# Patient Record
Sex: Female | Born: 1960 | Race: Black or African American | Hispanic: No | Marital: Single | Smoking: Former smoker
Health system: Southern US, Community
[De-identification: ages and names within clinical notes are randomized; demographics above are authoritative.]

## PROBLEM LIST (undated history)

## (undated) DIAGNOSIS — C259 Malignant neoplasm of pancreas, unspecified: Secondary | ICD-10-CM

## (undated) DIAGNOSIS — Z8049 Family history of malignant neoplasm of other genital organs: Secondary | ICD-10-CM

## (undated) DIAGNOSIS — Z800 Family history of malignant neoplasm of digestive organs: Secondary | ICD-10-CM

## (undated) HISTORY — DX: Family history of malignant neoplasm of other genital organs: Z80.49

## (undated) HISTORY — DX: Family history of malignant neoplasm of digestive organs: Z80.0

---

## 1998-05-09 ENCOUNTER — Encounter: Payer: Self-pay | Admitting: *Deleted

## 1998-05-09 ENCOUNTER — Ambulatory Visit (HOSPITAL_COMMUNITY): Admission: RE | Admit: 1998-05-09 | Discharge: 1998-05-09 | Payer: Self-pay | Admitting: *Deleted

## 1998-05-27 ENCOUNTER — Encounter: Admission: RE | Admit: 1998-05-27 | Discharge: 1998-05-27 | Payer: Self-pay | Admitting: Internal Medicine

## 1998-08-13 ENCOUNTER — Ambulatory Visit (HOSPITAL_COMMUNITY): Admission: RE | Admit: 1998-08-13 | Discharge: 1998-08-13 | Payer: Self-pay | Admitting: *Deleted

## 1998-08-13 ENCOUNTER — Encounter: Payer: Self-pay | Admitting: *Deleted

## 2010-07-04 ENCOUNTER — Inpatient Hospital Stay (INDEPENDENT_AMBULATORY_CARE_PROVIDER_SITE_OTHER)
Admission: RE | Admit: 2010-07-04 | Discharge: 2010-07-04 | Disposition: A | Discharge status: Home / Self Care | Payer: 59 | Source: Ambulatory Visit | Attending: Emergency Medicine | Admitting: Emergency Medicine

## 2010-07-04 DIAGNOSIS — H6690 Otitis media, unspecified, unspecified ear: Secondary | ICD-10-CM

## 2020-06-17 ENCOUNTER — Emergency Department (HOSPITAL_COMMUNITY): Payer: 59

## 2020-06-17 ENCOUNTER — Other Ambulatory Visit: Payer: Self-pay

## 2020-06-17 ENCOUNTER — Inpatient Hospital Stay (HOSPITAL_COMMUNITY)
Admission: EM | Admit: 2020-06-17 | Discharge: 2020-06-22 | DRG: 424 | Disposition: A | Discharge status: Home Health | Payer: 59 | Attending: Internal Medicine | Admitting: Internal Medicine

## 2020-06-17 ENCOUNTER — Encounter (HOSPITAL_COMMUNITY): Payer: Self-pay

## 2020-06-17 DIAGNOSIS — C787 Secondary malignant neoplasm of liver and intrahepatic bile duct: Secondary | ICD-10-CM | POA: Diagnosis not present

## 2020-06-17 DIAGNOSIS — C7800 Secondary malignant neoplasm of unspecified lung: Secondary | ICD-10-CM | POA: Diagnosis present

## 2020-06-17 DIAGNOSIS — K8689 Other specified diseases of pancreas: Secondary | ICD-10-CM | POA: Diagnosis not present

## 2020-06-17 DIAGNOSIS — Z800 Family history of malignant neoplasm of digestive organs: Secondary | ICD-10-CM

## 2020-06-17 DIAGNOSIS — I10 Essential (primary) hypertension: Secondary | ICD-10-CM | POA: Diagnosis present

## 2020-06-17 DIAGNOSIS — K5900 Constipation, unspecified: Secondary | ICD-10-CM | POA: Diagnosis present

## 2020-06-17 DIAGNOSIS — Z8049 Family history of malignant neoplasm of other genital organs: Secondary | ICD-10-CM | POA: Diagnosis not present

## 2020-06-17 DIAGNOSIS — Z20822 Contact with and (suspected) exposure to covid-19: Secondary | ICD-10-CM | POA: Diagnosis present

## 2020-06-17 DIAGNOSIS — R1011 Right upper quadrant pain: Secondary | ICD-10-CM

## 2020-06-17 DIAGNOSIS — C762 Malignant neoplasm of abdomen: Secondary | ICD-10-CM | POA: Diagnosis not present

## 2020-06-17 DIAGNOSIS — R160 Hepatomegaly, not elsewhere classified: Secondary | ICD-10-CM

## 2020-06-17 DIAGNOSIS — F17210 Nicotine dependence, cigarettes, uncomplicated: Secondary | ICD-10-CM | POA: Diagnosis present

## 2020-06-17 DIAGNOSIS — C252 Malignant neoplasm of tail of pancreas: Secondary | ICD-10-CM | POA: Diagnosis present

## 2020-06-17 DIAGNOSIS — R935 Abnormal findings on diagnostic imaging of other abdominal regions, including retroperitoneum: Secondary | ICD-10-CM

## 2020-06-17 LAB — COMPREHENSIVE METABOLIC PANEL
ALT: 26 U/L (ref 0–44)
AST: 38 U/L (ref 15–41)
Albumin: 3.3 g/dL — ABNORMAL LOW (ref 3.5–5.0)
Alkaline Phosphatase: 412 U/L — ABNORMAL HIGH (ref 38–126)
Anion gap: 9 (ref 5–15)
BUN: 9 mg/dL (ref 8–23)
CO2: 26 mmol/L (ref 22–32)
Calcium: 9.6 mg/dL (ref 8.9–10.3)
Chloride: 102 mmol/L (ref 98–111)
Creatinine, Ser: 1 mg/dL (ref 0.44–1.00)
GFR, Estimated: >60 mL/min (ref >60)
Glucose, Bld: 104 mg/dL — ABNORMAL HIGH (ref 70–99)
Potassium: 4 mmol/L (ref 3.5–5.1)
Sodium: 137 mmol/L (ref 135–145)
Total Bilirubin: 0.7 mg/dL (ref 0.3–1.2)
Total Protein: 8.1 g/dL (ref 6.5–8.1)

## 2020-06-17 LAB — CBC WITH DIFFERENTIAL/PLATELET
Abs Immature Granulocytes: 0.02 10*3/uL (ref 0.00–0.07)
Basophils Absolute: 0.1 10*3/uL (ref 0.0–0.1)
Basophils Relative: 1 %
Eosinophils Absolute: 0.1 10*3/uL (ref 0.0–0.5)
Eosinophils Relative: 2 %
HCT: 43.3 % (ref 36.0–46.0)
Hemoglobin: 14 g/dL (ref 12.0–15.0)
Immature Granulocytes: 0 %
Lymphocytes Relative: 33 %
Lymphs Abs: 2.2 10*3/uL (ref 0.7–4.0)
MCH: 29 pg (ref 26.0–34.0)
MCHC: 32.3 g/dL (ref 30.0–36.0)
MCV: 89.8 fL (ref 80.0–100.0)
Monocytes Absolute: 0.7 10*3/uL (ref 0.1–1.0)
Monocytes Relative: 10 %
Neutro Abs: 3.7 10*3/uL (ref 1.7–7.7)
Neutrophils Relative %: 54 %
Platelets: 386 10*3/uL (ref 150–400)
RBC: 4.82 MIL/uL (ref 3.87–5.11)
RDW: 12.7 % (ref 11.5–15.5)
WBC: 6.9 10*3/uL (ref 4.0–10.5)
nRBC: 0 % (ref 0.0–0.2)

## 2020-06-17 LAB — RESP PANEL BY RT-PCR (FLU A&B, COVID) ARPGX2
Influenza A by PCR: NEGATIVE
Influenza B by PCR: NEGATIVE
SARS Coronavirus 2 by RT PCR: NEGATIVE

## 2020-06-17 LAB — LIPASE, BLOOD: Lipase: 43 U/L (ref 11–51)

## 2020-06-17 MED ORDER — SENNA 8.6 MG PO TABS
2.0000 | ORAL_TABLET | Freq: Every day | ORAL | Status: DC
  Administered 2020-06-17 – 2020-06-21 (×4): 17.2 mg via ORAL
  Filled 2020-06-17 (×5): qty 2

## 2020-06-17 MED ORDER — SODIUM CHLORIDE 0.9% FLUSH
3.0000 mL | Freq: Two times a day (BID) | INTRAVENOUS | Status: DC
  Administered 2020-06-17 – 2020-06-22 (×7): 3 mL via INTRAVENOUS

## 2020-06-17 MED ORDER — SODIUM CHLORIDE 0.9% FLUSH: 3.0000 mL | INTRAVENOUS | Status: DC | PRN

## 2020-06-17 MED ORDER — MORPHINE SULFATE (PF) 4 MG/ML IV SOLN
6.0000 mg | Freq: Once | INTRAVENOUS | Status: AC
  Administered 2020-06-17: 6 mg via INTRAVENOUS
  Filled 2020-06-17: qty 2

## 2020-06-17 MED ORDER — ONDANSETRON HCL 4 MG PO TABS: 4.0000 mg | ORAL_TABLET | Freq: Four times a day (QID) | ORAL | Status: DC | PRN

## 2020-06-17 MED ORDER — MORPHINE SULFATE (PF) 2 MG/ML IV SOLN
2.0000 mg | INTRAVENOUS | Status: DC | PRN
Start: 2020-06-17 — End: 2020-06-19
  Administered 2020-06-19: 2 mg via INTRAVENOUS
  Filled 2020-06-17: qty 1

## 2020-06-17 MED ORDER — SODIUM CHLORIDE 0.9 % IV SOLN
250.0000 mL | INTRAVENOUS | Status: DC | PRN
  Administered 2020-06-17: 250 mL via INTRAVENOUS

## 2020-06-17 MED ORDER — HYDROMORPHONE HCL 1 MG/ML IJ SOLN
0.5000 mg | Freq: Once | INTRAMUSCULAR | Status: AC
  Administered 2020-06-17: 0.5 mg via INTRAVENOUS
  Filled 2020-06-17: qty 1

## 2020-06-17 MED ORDER — ONDANSETRON HCL 4 MG/2ML IJ SOLN: 4.0000 mg | Freq: Four times a day (QID) | INTRAMUSCULAR | Status: DC | PRN

## 2020-06-17 MED ORDER — IOHEXOL 300 MG/ML  SOLN
100.0000 mL | Freq: Once | INTRAMUSCULAR | Status: AC | PRN
  Administered 2020-06-17: 100 mL via INTRAVENOUS

## 2020-06-17 MED ORDER — SODIUM CHLORIDE 0.9 % IV BOLUS
1000.0000 mL | Freq: Once | INTRAVENOUS | Status: AC
  Administered 2020-06-17: 1000 mL via INTRAVENOUS

## 2020-06-17 MED ORDER — MAGNESIUM CITRATE PO SOLN: 1.0000 | Freq: Once | ORAL | Status: DC | PRN

## 2020-06-17 MED ORDER — IOHEXOL 350 MG/ML SOLN
50.0000 mL | Freq: Once | INTRAVENOUS | Status: AC | PRN
  Administered 2020-06-17: 50 mL via INTRAVENOUS

## 2020-06-17 MED ORDER — BISACODYL 5 MG PO TBEC: 5.0000 mg | DELAYED_RELEASE_TABLET | Freq: Every day | ORAL | Status: DC | PRN

## 2020-06-17 MED ORDER — POLYETHYLENE GLYCOL 3350 17 G PO PACK: 17.0000 g | PACK | Freq: Every day | ORAL | Status: DC | PRN

## 2020-06-17 NOTE — ED Provider Notes (Signed)
[place] EMERGENCY DEPARTMENT Provider Note   CSN: [id number] Arrival date & time: 06/17/20  0809     History Chief Complaint  Patient presents with  . Abdominal Pain    Kirsten [name] is a 60 y.o. female with no pertinent past medical history that presents the emergency department today for right-sided pain of her abdomen.  Patient states that its been hurting for 2 weeks.  Pain does not radiate anywhere, feels like a aching sensation, rates it a 9 out of 10.  States that she has not tried anything for it.  States that the only thing she can think of is she was moving a washing machine 2 and half weeks ago, was not having any pain and then a couple days later started having this pain on the right side.  Has never had anything like this before.  Denies any abdominal surgeries.  Denies any nausea, vomiting, fevers, chills.  Denies any chest pain or shortness of breath.  Pain is worse when she inhales.  States that the only relief is bending forward.  Denies any trauma to the area.  Denies any rib pain, history of coagulation disorder, prior DVT, recent surgery, recent long travel or hormone use.  Denies any dysuria or hematuria or urinary complaints.  Denies any vaginal complaints.  Normal bowel movements, no blood in her stool.  No sick contacts.  HPI     History reviewed. No pertinent past medical history.  There are no problems to display for this patient.   OB History   No obstetric history on file.     No family history on file.     Home Medications Prior to Admission medications   Medication Sig Start Date End Date Taking? Authorizing Provider  Aspirin-Salicylamide-Caffeine (BC HEADACHE POWDER PO) Take 1 packet by mouth daily as needed (PAIN).   Yes [provider]    Allergies    Patient has no known allergies.  Review of Systems   Review of Systems  Constitutional: Negative for chills, diaphoresis, fatigue and fever.  HENT: Negative  for congestion, sore throat and trouble swallowing.   Eyes: Negative for pain and visual disturbance.  Respiratory: Negative for cough, shortness of breath and wheezing.   Cardiovascular: Negative for chest pain, palpitations and leg swelling.  Gastrointestinal: Positive for abdominal pain. Negative for abdominal distention, blood in stool, diarrhea, nausea and vomiting.  Genitourinary: Negative for difficulty urinating.  Musculoskeletal: Negative for back pain, neck pain and neck stiffness.  Skin: Negative for pallor.  Neurological: Negative for dizziness, speech difficulty, weakness and headaches.  Psychiatric/Behavioral: Negative for confusion.    Physical Exam Updated Vital Signs BP (!) 157/73   Pulse 63   Temp (!) 97.5 F (36.4 C) (Oral)   Resp 17   Ht 5\' 3"  (1.6 m)   LMP  (LMP Unknown) Comment: Pt unaware when MP stopped  SpO2 98%   Physical Exam Constitutional:      General: She is in acute distress.     Appearance: Normal appearance. She is not ill-appearing, toxic-appearing or diaphoretic.  HENT:     Mouth/Throat:     Mouth: Mucous membranes are moist.     Pharynx: Oropharynx is clear.  Eyes:     General: No scleral icterus.    Extraocular Movements: Extraocular movements intact.     Pupils: Pupils are equal, round, and reactive to light.  Cardiovascular:     Rate and Rhythm: Regular rhythm. Tachycardia present.  Pulses: Normal pulses.     Heart sounds: Normal heart sounds.  Pulmonary:     Effort: Pulmonary effort is normal. No respiratory distress.     Breath sounds: Normal breath sounds. No stridor. No wheezing, rhonchi or rales.  Chest:     Chest wall: No tenderness.  Abdominal:     General: Abdomen is flat. There is no distension.     Palpations: Abdomen is soft.     Tenderness: There is abdominal tenderness in the right upper quadrant. There is no right CVA tenderness, left CVA tenderness, guarding or rebound. Negative signs include Murphy's sign and  McBurney's sign.       Comments: Patient with point tenderness to this area and the abdomen is depicted above.  Guarding in this area.  No true Murphy sign to the right upper quadrant.  Right upper quadrant in addition to right umbilical area.  No erythema.  Abdomen is soft.  No CVA tenderness.  Musculoskeletal:        General: No swelling or tenderness. Normal range of motion.     Cervical back: Normal range of motion and neck supple. No rigidity.     Right lower leg: No edema.     Left lower leg: No edema.  Skin:    General: Skin is warm and dry.     Capillary Refill: Capillary refill takes less than 2 seconds.     Coloration: Skin is not pale.  Neurological:     General: No focal deficit present.     Mental Status: She is alert and oriented to person, place, and time.  Psychiatric:        Mood and Affect: Mood normal.        Behavior: Behavior normal.     ED Results / Procedures / Treatments   Labs (all labs ordered are listed, but only abnormal results are displayed) Labs Reviewed  COMPREHENSIVE METABOLIC PANEL - Abnormal; Notable for the following components:      Result Value   Glucose, Bld 104 (*)    Albumin 3.3 (*)    Alkaline Phosphatase 412 (*)    All other components within normal limits  RESP PANEL BY RT-PCR (FLU A&B, COVID) ARPGX2  CBC WITH DIFFERENTIAL/PLATELET  LIPASE, BLOOD  URINALYSIS, ROUTINE W REFLEX MICROSCOPIC    EKG EKG Interpretation  Date/Time:  Monday June 17 2020 10:05:13 EDT Ventricular Rate:  86 PR Interval:  148 QRS Duration: 87 QT Interval:  368 QTC Calculation: 441 R Axis:   47 Text Interpretation: Sinus rhythm EKG WITHIN NORMAL LIMITS Confirmed by Theotis Burrow [id number]) on 06/17/2020 10:50:29 AM   Radiology CT Angio Chest PE W/Cm &/Or Wo Cm  Result Date: 06/17/2020 CLINICAL DATA:  Metastatic pancreatic cancer.  Shortness of breath. EXAM: CT ANGIOGRAPHY CHEST WITH CONTRAST TECHNIQUE: Multidetector CT imaging of the chest was  performed using the standard protocol during bolus administration of intravenous contrast. Multiplanar CT image reconstructions and MIPs were obtained to evaluate the vascular anatomy. CONTRAST:  40mL OMNIPAQUE IOHEXOL 350 MG/ML SOLN COMPARISON:  None. FINDINGS: Cardiovascular: The heart is borderline enlarged. No pericardial effusion. The aorta is normal in caliber. No dissection. Minimal scattered atherosclerotic calcifications. The pulmonary arterial tree is fairly well opacified. No filling defects to suggest pulmonary embolism. Mediastinum/Nodes: No enlarged mediastinal hilar lymph nodes to suggest metastatic disease. The esophagus is grossly normal. Small epicardial node is noted on the right side on image 93/5. Lungs/Pleura: Scattered small pulmonary nodules consistent with pulmonary metastatic disease. The  largest pulmonary lesion is at the left lung base and measures 15.5 mm. No pleural effusions or pulmonary edema. No pneumothorax. Upper Abdomen: Diffuse hepatic metastatic disease. Large centrally necrotic pancreatic tail mass. Musculoskeletal: No significant bony findings. No findings suspicious for osseous metastatic disease. Review of the MIP images confirms the above findings. IMPRESSION: 1. No CT findings for pulmonary embolism. 2. Normal caliber thoracic aorta. 3. Scattered small pulmonary nodules consistent with pulmonary metastatic disease. 4. Diffuse hepatic metastatic disease. 5. Large centrally necrotic pancreatic tail mass. 6. Aortic atherosclerosis. Aortic Atherosclerosis (ICD10-I70.0). Electronically Signed   By: Marijo Sanes M.D.   On: 06/17/2020 14:08   CT Abdomen Pelvis W Contrast  Result Date: 06/17/2020 CLINICAL DATA:  Abdominal pain, right-sided for 2 weeks. EXAM: CT ABDOMEN AND PELVIS WITH CONTRAST TECHNIQUE: Multidetector CT imaging of the abdomen and pelvis was performed using the standard protocol following bolus administration of intravenous contrast. CONTRAST:  163mL  OMNIPAQUE IOHEXOL 300 MG/ML  SOLN COMPARISON:  None. FINDINGS: Lower chest: Pulmonary nodule at the left lung base measuring 16 mm. Hepatobiliary: Multiple hypodense liver masses throughout both lobes, measured at up to 4 cm in the posterior segment right lobe. The enhancement characteristics match the presumed primary pancreatic mass.No evidence of biliary obstruction or stone. Pancreas: Hypointense mass arising from the tail of the pancreas with growth into the peripancreatic fat. The mass measures up to 4.5 cm in diameter. Spleen: Probable direct invasion of the primary pancreas mass into the hilum. The spleen is heterogeneously enhancing in the setting of chronic splenic vascular compromise. Adrenals/Urinary Tract: Negative adrenals. Marked left renal scarring more heavily affecting the lower pole. Smaller area of right lower pole renal scarring. Unremarkable bladder. Stomach/Bowel:  No obstruction. No visible inflammation. Vascular/Lymphatic: Nonenhancing splenic vessels attributed to the pancreas tumor. Enlarged periaortic lymph nodes. A low-density node right of the aorta measures 13 mm in diameter. Reproductive:Thickened appearance of the right adnexa with posterior low-density component in total measuring 3 cm. Suspect metastatic coating of the right ovary. Other: Small nodule at the low left pericolic gutter. Nodularity along the posterior and inferior stomach is likely also peritoneal. Musculoskeletal: Spinal degeneration with L4-5 anterolisthesis and stenosis. No bony metastasis is noted. 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 Direct invasion of the splenic hilum with chronic splenic vascular compromise. Electronically Signed   By: Monte Fantasia M.D.   On: 06/17/2020 10:05   US Abdomen Limited RUQ (LIVER/GB)  Result Date: 06/17/2020 CLINICAL DATA:  Right upper quadrant pain EXAM: ULTRASOUND ABDOMEN  LIMITED RIGHT UPPER QUADRANT COMPARISON:  None. FINDINGS: Gallbladder: No gallstones or wall thickening visualized. No sonographic Murphy sign noted by sonographer. Common bile duct: Diameter: 4 mm, normal Liver: Multiple (at least 5) hypoechoic lesions are identified. Largest on the left measures 2.7 x 2.2 x 2.1 cm. Largest on the right measures 5.1 x 4.2 x 4.5 cm. Within normal limits in parenchymal echogenicity. Portal vein is patent on color Doppler imaging with normal direction of blood flow towards the liver. Other: None. IMPRESSION: Multiple hypoechoic liver lesions. Correlate with planned CT imaging. Unremarkable sonographic appearance of the gallbladder. Electronically Signed   By: Macy Mis M.D.   On: 06/17/2020 09:48    Procedures Procedures   Medications Ordered in ED Medications  HYDROmorphone (DILAUDID) injection 0.5 mg (0.5 mg Intravenous Given 06/17/20 0844)  sodium chloride 0.9 % bolus 1,000 mL (0 mLs Intravenous Stopped 06/17/20 1205)  iohexol (OMNIPAQUE) 300  MG/ML solution 100 mL (100 mLs Intravenous Contrast Given 06/17/20 0952)  iohexol (OMNIPAQUE) 350 MG/ML injection 50 mL (50 mLs Intravenous Contrast Given 06/17/20 1342)  morphine 4 MG/ML injection 6 mg (6 mg Intravenous Given 06/17/20 1459)    ED Course  I have reviewed the triage vital signs and the nursing notes.  Pertinent labs & imaging results that were available during my care of the patient were reviewed by me and considered in my medical decision making (see chart for details).    MDM Rules/Calculators/A&P                          Kirsten Hostetler is a 60 y.o. female with no pertinent past medical history that presents the emergency department today for right-sided pain of her abdomen.  Patient does appear to be in pain, guarding to that side of her abdomen.  Will obtain basic labs and CT imaging and ultrasound of right upper quadrant.  Pain medication given.  Differential to include biliary colic, muscle  strain.  Patient does not appear septic.    Unfortunately work-up today shows pancreatic adenocarcinoma with necrotic pancreatic tail mass with metastatic disease to the lung and widely distribution to the liver as well.  Patient does not have a primary care doctor, does have insurance.  PE study negative.  Pain is not been controlled with morphine or Dilaudid, patient is still complaining of severe pain with rebound tenderness to right upper quadrant, patient to be admitted for pain control. Spoke to Dr. Alvy Bimler, hemonc  who will consult. Spoke to Dr. Jamse Arn who will admit.  The patient appears reasonably stabilized for admission considering the current resources, flow, and capabilities available in the ED at this time, and I doubt any other [facility] requiring further screening and/or treatment in the ED prior to admission.  I discussed this case with my attending physician who cosigned this note including patient's presenting symptoms, physical exam, and planned diagnostics and interventions. Attending physician stated agreement with plan or made changes to plan which were implemented.   Attending physician assessed patient at bedside.  Final Clinical Impression(s) / ED Diagnoses Final diagnoses:  RUQ pain  Pancreatic mass  Abnormal CT of the abdomen    Rx / DC Orders ED Discharge Orders    None       Alfredia Client, PA-C 06/17/20 Elliott, Wenda Overland, MD 06/18/20 [phone]

## 2020-06-17 NOTE — ED Notes (Signed)
Attempted to give report. Secretary informed me unsure of staffing to place pt. She will call back after decision has been made from [facility].

## 2020-06-17 NOTE — ED Notes (Signed)
Attempted to give report to 6N. Secretary took message to have nurse return call.

## 2020-06-17 NOTE — ED Notes (Signed)
Patient is aware she needs to give [place] a urine spec.

## 2020-06-17 NOTE — ED Triage Notes (Signed)
Pt c/o right side abdominal pain and the pain can be felt under her rib cage. Pt said the pain started 2 weeks ago after moving a washing machine. Pt states it hurts when taking a deep breath and describes the pain as a cramp after running. Pt denies chest pain

## 2020-06-17 NOTE — ED Notes (Signed)
Patient transported to Ultrasound 

## 2020-06-17 NOTE — ED Notes (Signed)
Patient transported to CT 

## 2020-06-17 NOTE — Plan of Care (Signed)

## 2020-06-17 NOTE — H&P (Signed)
History and Physical:    Kirsten [name]   YHC:[id number] DOB: Jul 30, 1960 DOA: 06/17/2020  Referring MD/provider: PA Posey Pronto PCP: Pcp, No   Patient coming from: Home  Chief Complaint: Pleuritic chest pain/right upper quadrant pain  History of Present Illness:   Kirsten [name] is an 60 y.o. female  with no significant PMH was in her usual state of health until 2 months ago when she noted that her pants were very loose on her.  She thought she might of been losing weight.  About 2 weeks ago she started having right-sided abdominal pain without radiation.  It was intermittently sharp, intermittently aching and she attributed to having pulled a muscle when she moved a washing machine.  Patient works doing housekeeping.  Patient presented to the ED today because the pain has gotten significantly worse such that she is unable to take deep breaths, cough or move without having sharp pain.  Patient notes that she concentrates on taking only shallow breaths.  She is not sure if she is short of breath or not but notes that every time she tries to take a deep breath she has severe pain in her right side.  Patient denies history of tobacco or alcohol use.  Patient works in housekeeping and denies any occupational exposures.  ED Course: Patient was noted to be afebrile and somewhat hypertensive.  Elder Love data were normal and unrevealing.  CT of abdomen pelvis revealed necrotic mass in the tail of the pancreas with multiple mets in the liver and small mets in the lungs.  Of note patient has involvement of the splenic hilum with chronic splenic vascular compromise.  She was discussed with Dr. Rozetta Nunnery who recommended admission for pain management and diagnostic work-up.  ROS:   ROS   Review of Systems: Eyes: Denies recent change in vision, no discharge, redness, pain noted Endocrine: Denies heat/cold intolerance, polyuria or weight loss. Respiratory: Denies cough, SOB at rest or hemoptysis GI: Denies  nausea, vomiting, diarrhea or constipation GU: Denies dysuria, frequency or hematuria CNS: Denies HA, dizziness, confusion, new weakness or clumsiness. Blood/lymphatics: Denies easy bruising or bleeding Mood/affect: Denies anxiety/depression    Past Medical History:   History reviewed. No pertinent past medical history.  Past Surgical History:     Social History:   Social History   Socioeconomic History  . Marital status: Single    Spouse name: Not on file  . Number of children: Not on file  . Years of education: Not on file  . Highest education level: Not on file  Occupational History  . Not on file  Tobacco Use  . Smoking status: Not on file  . Smokeless tobacco: Not on file  Substance and Sexual Activity  . Alcohol use: Not on file  . Drug use: Not on file  . Sexual activity: Not on file  Other Topics Concern  . Not on file  Social History Narrative  . Not on file   Social Determinants of Health   Financial Resource Strain: Not on file  Food Insecurity: Not on file  Transportation Needs: Not on file  Physical Activity: Not on file  Stress: Not on file  Social Connections: Not on file  Intimate Partner Violence: Not on file    Allergies   Patient has no known allergies.  Family history:   No family history on file.  Current Medications:   Prior to Admission medications   Medication Sig Start Date End Date Taking? Authorizing Provider  Aspirin-Salicylamide-Caffeine [facility]  HEADACHE POWDER PO) Take 1 packet by mouth daily as needed (PAIN).   Yes [provider]    Physical Exam:   Vitals:   06/17/20 1130 06/17/20 1200 06/17/20 1230 06/17/20 1415  BP: (!) 160/88 (!) 153/98 (!) 156/83 (!) 157/73  Pulse: 82 78 78 63  Resp: (!) 32 (!) 28 (!) 30 17  Temp:      TempSrc:      SpO2: 100% 99% 98% 98%  Height:         Physical Exam: Blood pressure (!) 157/73, pulse 63, temperature (!) 97.5 F (36.4 C), temperature source Oral, resp. rate 17,  height 5\' 3"  (1.6 m), SpO2 98 %. Gen: Teary female lying flat in bed in NAD with teary daughter at bedside. Eyes: sclera anicteric, conjuctiva mildly injected bilaterally CVS: S1-S2, regulary, no gallops Respiratory:  decreased air entry likely secondary to decreased inspiratory effort GI: NABS, soft, patient has voluntary guarding when I examine her right side, she has tenderness to palpation under 12th rib on the right. LE: No edema. No cyanosis Neuro: A/O x 3, Moving all extremities equally with normal strength, CN 3-12 intact, grossly nonfocal.  Psych:  mood and affect appropriate to situation. Skin: no rashes or lesions or ulcers,    Data Review:    Labs: Basic Metabolic Panel: Recent Labs  Lab 06/17/20 0830  NA 137  K 4.0  CL 102  CO2 26  GLUCOSE 104*  BUN 9  CREATININE 1.00  CALCIUM 9.6   Liver Function Tests: Recent Labs  Lab 06/17/20 0830  AST 38  ALT 26  ALKPHOS 412*  BILITOT 0.7  PROT 8.1  ALBUMIN 3.3*   Recent Labs  Lab 06/17/20 0830  LIPASE 43   No results for input(s): AMMONIA in the last 168 hours. CBC: Recent Labs  Lab 06/17/20 0830  WBC 6.9  NEUTROABS 3.7  HGB 14.0  HCT 43.3  MCV 89.8  PLT 386   Cardiac Enzymes: No results for input(s): CKTOTAL, CKMB, CKMBINDEX, TROPONINI in the last 168 hours.  BNP (last 3 results) No results for input(s): PROBNP in the last 8760 hours. CBG: No results for input(s): GLUCAP in the last 168 hours.  Urinalysis No results found for: COLORURINE, APPEARANCEUR, LABSPEC, [place], GLUCOSEU, HGBUR, BILIRUBINUR, KETONESUR, PROTEINUR, UROBILINOGEN, NITRITE, LEUKOCYTESUR    Radiographic Studies: CT Angio Chest PE W/Cm &/Or Wo Cm  Result Date: 06/17/2020 CLINICAL DATA:  Metastatic pancreatic cancer.  Shortness of breath. EXAM: CT ANGIOGRAPHY CHEST WITH CONTRAST TECHNIQUE: Multidetector CT imaging of the chest was performed using the standard protocol during bolus administration of intravenous contrast.  Multiplanar CT image reconstructions and MIPs were obtained to evaluate the vascular anatomy. CONTRAST:  85mL OMNIPAQUE IOHEXOL 350 MG/ML SOLN COMPARISON:  None. FINDINGS: Cardiovascular: The heart is borderline enlarged. No pericardial effusion. The aorta is normal in caliber. No dissection. Minimal scattered atherosclerotic calcifications. The pulmonary arterial tree is fairly well opacified. No filling defects to suggest pulmonary embolism. Mediastinum/Nodes: No enlarged mediastinal hilar lymph nodes to suggest metastatic disease. The esophagus is grossly normal. Small epicardial node is noted on the right side on image 93/5. Lungs/Pleura: Scattered small pulmonary nodules consistent with pulmonary metastatic disease. The largest pulmonary lesion is at the left lung base and measures 15.5 mm. No pleural effusions or pulmonary edema. No pneumothorax. Upper Abdomen: Diffuse hepatic metastatic disease. Large centrally necrotic pancreatic tail mass. Musculoskeletal: No significant bony findings. No findings suspicious for osseous metastatic disease. Review of the MIP images confirms  the above findings. IMPRESSION: 1. No CT findings for pulmonary embolism. 2. Normal caliber thoracic aorta. 3. Scattered small pulmonary nodules consistent with pulmonary metastatic disease. 4. Diffuse hepatic metastatic disease. 5. Large centrally necrotic pancreatic tail mass. 6. Aortic atherosclerosis. Aortic Atherosclerosis (ICD10-I70.0). Electronically Signed   By: Marijo Sanes M.D.   On: 06/17/2020 14:08   CT Abdomen Pelvis W Contrast  Result Date: 06/17/2020 CLINICAL DATA:  Abdominal pain, right-sided for 2 weeks. EXAM: CT ABDOMEN AND PELVIS WITH CONTRAST TECHNIQUE: Multidetector CT imaging of the abdomen and pelvis was performed using the standard protocol following bolus administration of intravenous contrast. CONTRAST:  185mL OMNIPAQUE IOHEXOL 300 MG/ML  SOLN COMPARISON:  None. FINDINGS: Lower chest: Pulmonary nodule at the  left lung base measuring 16 mm. Hepatobiliary: Multiple hypodense liver masses throughout both lobes, measured at up to 4 cm in the posterior segment right lobe. The enhancement characteristics match the presumed primary pancreatic mass.No evidence of biliary obstruction or stone. Pancreas: Hypointense mass arising from the tail of the pancreas with growth into the peripancreatic fat. The mass measures up to 4.5 cm in diameter. Spleen: Probable direct invasion of the primary pancreas mass into the hilum. The spleen is heterogeneously enhancing in the setting of chronic splenic vascular compromise. Adrenals/Urinary Tract: Negative adrenals. Marked left renal scarring more heavily affecting the lower pole. Smaller area of right lower pole renal scarring. Unremarkable bladder. Stomach/Bowel:  No obstruction. No visible inflammation. Vascular/Lymphatic: Nonenhancing splenic vessels attributed to the pancreas tumor. Enlarged periaortic lymph nodes. A low-density node right of the aorta measures 13 mm in diameter. Reproductive:Thickened appearance of the right adnexa with posterior low-density component in total measuring 3 cm. Suspect metastatic coating of the right ovary. Other: Small nodule at the low left pericolic gutter. Nodularity along the posterior and inferior stomach is likely also peritoneal. Musculoskeletal: Spinal degeneration with L4-5 anterolisthesis and stenosis. No bony metastasis is noted. 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 Direct invasion of the splenic hilum with chronic splenic vascular compromise. Electronically Signed   By: Monte Fantasia M.D.   On: 06/17/2020 10:05   US Abdomen Limited RUQ (LIVER/GB)  Result Date: 06/17/2020 CLINICAL DATA:  Right upper quadrant pain EXAM: ULTRASOUND ABDOMEN LIMITED RIGHT UPPER QUADRANT COMPARISON:  None. FINDINGS: Gallbladder: No gallstones or wall thickening  visualized. No sonographic Murphy sign noted by sonographer. Common bile duct: Diameter: 4 mm, normal Liver: Multiple (at least 5) hypoechoic lesions are identified. Largest on the left measures 2.7 x 2.2 x 2.1 cm. Largest on the right measures 5.1 x 4.2 x 4.5 cm. Within normal limits in parenchymal echogenicity. Portal vein is patent on color Doppler imaging with normal direction of blood flow towards the liver. Other: None. IMPRESSION: Multiple hypoechoic liver lesions. Correlate with planned CT imaging. Unremarkable sonographic appearance of the gallbladder. Electronically Signed   By: Macy Mis M.D.   On: 06/17/2020 09:48    EKG: Independently reviewed.  Sinus rhythm at 86.  Normal intervals.  Normal axis.  No acute ST-T wave changes.  Normal EKG.   Assessment/Plan:   Principal Problem:   Abdominal malignancy ([place])  60 year old female presented with right-sided pleuritic chest pain.  Work-up reveals necrotic pancreatic mass with involvement of liver and lung mets and involvement of splenic hilum and vascular compromise.  Likely pancreatic cancer Patient and daughter were apparently told likely diagnosis by ED provider IR consult placed for biopsy, I have not made her  n.p.o. Dr. Rozetta Nunnery has been contacted and is aware. SCDs for DVT prophylaxis, these can be changed to pharmacologic DVT prophylaxis after she has had a biopsy.  Pain Dilaudid 0.5 mg IV was ineffective for pain control We will start morphine 2 mg IV every 4 hours as needed This can be uptitrated as needed. Patient started on senna 2 tablets at bedtime which will need to be increased as needed depending on how much narcotic she ends up needing. We will need to keep a close eye on constipation  Hypertension Patient is somewhat hypertensive at present which may represent chronic hypertension versus anxiety mediated.  This can be followed and treated if warranted.   Other information:   DVT prophylaxis: SCD  ordered. Code Status: Full Family Communication: Daughter and sister were present at bedside Disposition Plan: Home Consults called: IR, oncology Admission status: Inpatient  Lovey Crupi Tublu Yachet Mattson [facility]  If 7PM-7AM, please contact night-coverage [email or web address] Password [facility] 06/17/2020, 3:12 PM

## 2020-06-18 ENCOUNTER — Inpatient Hospital Stay (HOSPITAL_COMMUNITY): Payer: 59

## 2020-06-18 DIAGNOSIS — C762 Malignant neoplasm of abdomen: Secondary | ICD-10-CM | POA: Diagnosis not present

## 2020-06-18 DIAGNOSIS — R1011 Right upper quadrant pain: Secondary | ICD-10-CM

## 2020-06-18 DIAGNOSIS — K8689 Other specified diseases of pancreas: Secondary | ICD-10-CM

## 2020-06-18 LAB — URINALYSIS, ROUTINE W REFLEX MICROSCOPIC
Bilirubin Urine: NEGATIVE
Glucose, UA: NEGATIVE mg/dL
Hgb urine dipstick: NEGATIVE
Ketones, ur: 20 mg/dL — AB
Leukocytes,Ua: NEGATIVE
Nitrite: NEGATIVE
Protein, ur: NEGATIVE mg/dL
Specific Gravity, Urine: >1.046 — ABNORMAL HIGH (ref 1.005–1.030)
pH: 5 (ref 5.0–8.0)

## 2020-06-18 LAB — CBC
HCT: 36 % (ref 36.0–46.0)
Hemoglobin: 11.5 g/dL — ABNORMAL LOW (ref 12.0–15.0)
MCH: 28.3 pg (ref 26.0–34.0)
MCHC: 31.9 g/dL (ref 30.0–36.0)
MCV: 88.5 fL (ref 80.0–100.0)
Platelets: 306 10*3/uL (ref 150–400)
RBC: 4.07 MIL/uL (ref 3.87–5.11)
RDW: 12.9 % (ref 11.5–15.5)
WBC: 6.3 10*3/uL (ref 4.0–10.5)
nRBC: 0 % (ref 0.0–0.2)

## 2020-06-18 LAB — COMPREHENSIVE METABOLIC PANEL
ALT: 18 U/L (ref 0–44)
AST: 23 U/L (ref 15–41)
Albumin: 2.6 g/dL — ABNORMAL LOW (ref 3.5–5.0)
Alkaline Phosphatase: 311 U/L — ABNORMAL HIGH (ref 38–126)
Anion gap: 7 (ref 5–15)
BUN: 7 mg/dL — ABNORMAL LOW (ref 8–23)
CO2: 25 mmol/L (ref 22–32)
Calcium: 8.9 mg/dL (ref 8.9–10.3)
Chloride: 103 mmol/L (ref 98–111)
Creatinine, Ser: 0.97 mg/dL (ref 0.44–1.00)
GFR, Estimated: >60 mL/min (ref >60)
Glucose, Bld: 92 mg/dL (ref 70–99)
Potassium: 3.6 mmol/L (ref 3.5–5.1)
Sodium: 135 mmol/L (ref 135–145)
Total Bilirubin: 0.6 mg/dL (ref 0.3–1.2)
Total Protein: 6.2 g/dL — ABNORMAL LOW (ref 6.5–8.1)

## 2020-06-18 LAB — HIV ANTIBODY (ROUTINE TESTING W REFLEX): HIV Screen 4th Generation wRfx: NONREACTIVE

## 2020-06-18 LAB — PROTIME-INR
INR: 1.1 (ref 0.8–1.2)
Prothrombin Time: 14 seconds (ref 11.4–15.2)

## 2020-06-18 MED ORDER — BISACODYL 5 MG PO TBEC
10.0000 mg | DELAYED_RELEASE_TABLET | Freq: Once | ORAL | Status: AC
  Administered 2020-06-18: 10 mg via ORAL
  Filled 2020-06-18: qty 2

## 2020-06-18 MED ORDER — MIDAZOLAM HCL 2 MG/2ML IJ SOLN
INTRAMUSCULAR | Status: AC
  Filled 2020-06-18: qty 2

## 2020-06-18 MED ORDER — SODIUM CHLORIDE 0.9 % IV SOLN
INTRAVENOUS | Status: AC | PRN
  Administered 2020-06-18: 250 mL via INTRAVENOUS

## 2020-06-18 MED ORDER — OXYCODONE HCL 5 MG PO TABS
5.0000 mg | ORAL_TABLET | ORAL | Status: DC | PRN
  Administered 2020-06-18 (×2): 5 mg via ORAL
  Filled 2020-06-18 (×2): qty 1

## 2020-06-18 MED ORDER — LIDOCAINE HCL (PF) 1 % IJ SOLN
INTRAMUSCULAR | Status: AC | PRN
  Administered 2020-06-18: 5 mL

## 2020-06-18 MED ORDER — MIDAZOLAM HCL 2 MG/2ML IJ SOLN
INTRAMUSCULAR | Status: AC | PRN
  Administered 2020-06-18: 1 mg via INTRAVENOUS

## 2020-06-18 MED ORDER — FENTANYL CITRATE (PF) 100 MCG/2ML IJ SOLN
INTRAMUSCULAR | Status: AC | PRN
  Administered 2020-06-18 (×2): 25 ug via INTRAVENOUS

## 2020-06-18 MED ORDER — GELATIN ABSORBABLE 12-7 MM EX MISC
CUTANEOUS | Status: AC | PRN
  Administered 2020-06-18: 1

## 2020-06-18 MED ORDER — POLYETHYLENE GLYCOL 3350 17 G PO PACK
17.0000 g | PACK | Freq: Every day | ORAL | Status: DC
  Administered 2020-06-18 – 2020-06-22 (×5): 17 g via ORAL
  Filled 2020-06-18 (×5): qty 1

## 2020-06-18 MED ORDER — LIDOCAINE HCL (PF) 1 % IJ SOLN
INTRAMUSCULAR | Status: AC
  Filled 2020-06-18: qty 30

## 2020-06-18 MED ORDER — FENTANYL CITRATE (PF) 100 MCG/2ML IJ SOLN
INTRAMUSCULAR | Status: AC
  Filled 2020-06-18: qty 2

## 2020-06-18 MED ORDER — GELATIN ABSORBABLE 12-7 MM EX MISC
CUTANEOUS | Status: AC
  Filled 2020-06-18: qty 1

## 2020-06-18 NOTE — Procedures (Signed)
Interventional Radiology Procedure Note  Procedure: Ultrasound guided liver mass biopsy  Findings: Please refer to procedural dictation for full description. Mass in left lobe selected for 18 ga core biopsy x3.  Samples placed in formalin.  Gelfoam needle track embolization.   Complications: None immediate  Estimated Blood Loss: < 5 mL  Recommendations: Strict bedrest for 3 hours. ADAT from IR standpoint. Follow up Pathology results.   Ruthann Cancer, MD

## 2020-06-18 NOTE — Consult Note (Addendum)
Chief Complaint: Patient was seen in consultation today for liver lesion biopsy Chief Complaint  Patient presents with  . Abdominal Pain   at the request of Dr Darrold Junker   Supervising Physician: Ruthann Cancer  Patient Status: [facility] - In-pt  History of Present Illness: Kirsten [name] is a 60 y.o. 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 No CT findings for pulmonary embolism. 2. Normal caliber thoracic aorta. 3. Scattered small pulmonary nodules consistent with pulmonary metastatic disease. 4. Diffuse hepatic metastatic disease. 5. Large centrally necrotic pancreatic tail mass.  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 Direct invasion of the splenic hilum with chronic splenic vascular compromise.  Known family Hx:  Sister with pancreatic cancer; father with stomach cancer; mother with what seems to be uterine cancer  Request made for liver lesion biopsy per Dr Benay Spice and [facility] Dr Serafina Royals reviewed imaging and approves procedure  Allergies: Patient has no known allergies.  Medications: Prior to Admission medications   Medication Sig Start Date End Date Taking? Authorizing Provider  Aspirin-Salicylamide-Caffeine ([facility] HEADACHE POWDER PO) Take 1 packet by mouth daily as needed (PAIN).   Yes [provider]     No family history on file.  Social History   Socioeconomic History  . Marital status: Single    Spouse name: Not on file  . Number of children: Not on file  . Years of education: Not on file  . Highest education level: Not on file  Occupational History  . Not on file  Tobacco Use  . Smoking status: Not  on file  . Smokeless tobacco: Not on file  Substance and Sexual Activity  . Alcohol use: Not on file  . Drug use: Not on file  . Sexual activity: Not on file  Other Topics Concern  . Not on file  Social History Narrative  . Not on file   Social Determinants of Health   Financial Resource Strain: Not on file  Food Insecurity: Not on file  Transportation Needs: Not on file  Physical Activity: Not on file  Stress: Not on file  Social Connections: Not on file     Review of Systems: A 12 point ROS discussed and pertinent positives are indicated in the HPI above.  All other systems are negative.  Review of Systems  Constitutional: Positive for activity change, appetite change and unexpected weight change. Negative for fatigue and fever.  HENT: Negative for trouble swallowing.   Respiratory: Negative for cough and shortness of breath.   Cardiovascular: Negative for chest pain.  Gastrointestinal: Positive for abdominal pain. Negative for nausea and vomiting.  Musculoskeletal: Negative for back pain.  Psychiatric/Behavioral: Negative for behavioral problems and confusion.    Vital Signs: BP (!) 156/80 (BP Location: Left Arm)   Pulse 83   Temp 98.9 F (37.2 C) (Oral)   Resp 16   Ht 5\' 3"  (1.6 m)   LMP  (LMP Unknown) Comment: Pt unaware when MP stopped  SpO2 96%   Physical Exam Vitals reviewed.  HENT:     Mouth/Throat:     Mouth: Mucous membranes are moist.  Cardiovascular:     Rate and Rhythm: Normal rate  and regular rhythm.     Heart sounds: Normal heart sounds.  Pulmonary:     Effort: Pulmonary effort is normal.     Breath sounds: Normal breath sounds.  Abdominal:     General: Bowel sounds are normal.     Palpations: Abdomen is soft.     Tenderness: There is abdominal tenderness in the right upper quadrant.  Skin:    General: Skin is warm.  Neurological:     Mental Status: She is alert and oriented to person, place, and time.  Psychiatric:        Behavior:  Behavior normal.     Imaging: CT Angio Chest PE W/Cm &/Or Wo Cm  Result Date: 06/17/2020 CLINICAL DATA:  Metastatic pancreatic cancer.  Shortness of breath. EXAM: CT ANGIOGRAPHY CHEST WITH CONTRAST TECHNIQUE: Multidetector CT imaging of the chest was performed using the standard protocol during bolus administration of intravenous contrast. Multiplanar CT image reconstructions and MIPs were obtained to evaluate the vascular anatomy. CONTRAST:  45mL OMNIPAQUE IOHEXOL 350 MG/ML SOLN COMPARISON:  None. FINDINGS: Cardiovascular: The heart is borderline enlarged. No pericardial effusion. The aorta is normal in caliber. No dissection. Minimal scattered atherosclerotic calcifications. The pulmonary arterial tree is fairly well opacified. No filling defects to suggest pulmonary embolism. Mediastinum/Nodes: No enlarged mediastinal hilar lymph nodes to suggest metastatic disease. The esophagus is grossly normal. Small epicardial node is noted on the right side on image 93/5. Lungs/Pleura: Scattered small pulmonary nodules consistent with pulmonary metastatic disease. The largest pulmonary lesion is at the left lung base and measures 15.5 mm. No pleural effusions or pulmonary edema. No pneumothorax. Upper Abdomen: Diffuse hepatic metastatic disease. Large centrally necrotic pancreatic tail mass. Musculoskeletal: No significant bony findings. No findings suspicious for osseous metastatic disease. Review of the MIP images confirms the above findings. IMPRESSION: 1. No CT findings for pulmonary embolism. 2. Normal caliber thoracic aorta. 3. Scattered small pulmonary nodules consistent with pulmonary metastatic disease. 4. Diffuse hepatic metastatic disease. 5. Large centrally necrotic pancreatic tail mass. 6. Aortic atherosclerosis. Aortic Atherosclerosis (ICD10-I70.0). Electronically Signed   By: Marijo Sanes M.D.   On: 06/17/2020 14:08   CT Abdomen Pelvis W Contrast  Result Date: 06/17/2020 CLINICAL DATA:  Abdominal  pain, right-sided for 2 weeks. EXAM: CT ABDOMEN AND PELVIS WITH CONTRAST TECHNIQUE: Multidetector CT imaging of the abdomen and pelvis was performed using the standard protocol following bolus administration of intravenous contrast. CONTRAST:  162mL OMNIPAQUE IOHEXOL 300 MG/ML  SOLN COMPARISON:  None. FINDINGS: Lower chest: Pulmonary nodule at the left lung base measuring 16 mm. Hepatobiliary: Multiple hypodense liver masses throughout both lobes, measured at up to 4 cm in the posterior segment right lobe. The enhancement characteristics match the presumed primary pancreatic mass.No evidence of biliary obstruction or stone. Pancreas: Hypointense mass arising from the tail of the pancreas with growth into the peripancreatic fat. The mass measures up to 4.5 cm in diameter. Spleen: Probable direct invasion of the primary pancreas mass into the hilum. The spleen is heterogeneously enhancing in the setting of chronic splenic vascular compromise. Adrenals/Urinary Tract: Negative adrenals. Marked left renal scarring more heavily affecting the lower pole. Smaller area of right lower pole renal scarring. Unremarkable bladder. Stomach/Bowel:  No obstruction. No visible inflammation. Vascular/Lymphatic: Nonenhancing splenic vessels attributed to the pancreas tumor. Enlarged periaortic lymph nodes. A low-density node right of the aorta measures 13 mm in diameter. Reproductive:Thickened appearance of the right adnexa with posterior low-density component in total measuring 3 cm. Suspect metastatic coating of  the right ovary. Other: Small nodule at the low left pericolic gutter. Nodularity along the posterior and inferior stomach is likely also peritoneal. Musculoskeletal: Spinal degeneration with L4-5 anterolisthesis and stenosis. No bony metastasis is noted. 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 Direct invasion of the splenic hilum  with chronic splenic vascular compromise. Electronically Signed   By: Monte Fantasia M.D.   On: 06/17/2020 10:05   US Abdomen Limited RUQ (LIVER/GB)  Result Date: 06/17/2020 CLINICAL DATA:  Right upper quadrant pain EXAM: ULTRASOUND ABDOMEN LIMITED RIGHT UPPER QUADRANT COMPARISON:  None. FINDINGS: Gallbladder: No gallstones or wall thickening visualized. No sonographic Murphy sign noted by sonographer. Common bile duct: Diameter: 4 mm, normal Liver: Multiple (at least 5) hypoechoic lesions are identified. Largest on the left measures 2.7 x 2.2 x 2.1 cm. Largest on the right measures 5.1 x 4.2 x 4.5 cm. Within normal limits in parenchymal echogenicity. Portal vein is patent on color Doppler imaging with normal direction of blood flow towards the liver. Other: None. IMPRESSION: Multiple hypoechoic liver lesions. Correlate with planned CT imaging. Unremarkable sonographic appearance of the gallbladder. Electronically Signed   By: Macy Mis M.D.   On: 06/17/2020 09:48    Labs:  CBC: Recent Labs    06/17/20 0830 06/18/20 0259  WBC 6.9 6.3  HGB 14.0 11.5*  HCT 43.3 36.0  PLT 386 306    COAGS: No results for input(s): INR, APTT in the last 8760 hours.  BMP: Recent Labs    06/17/20 0830 06/18/20 0259  NA 137 135  K 4.0 3.6  CL 102 103  CO2 26 25  GLUCOSE 104* 92  BUN 9 7*  CALCIUM 9.6 8.9  CREATININE 1.00 0.97  GFRNONAA >60 >60    LIVER FUNCTION TESTS: Recent Labs    06/17/20 0830 06/18/20 0259  BILITOT 0.7 0.6  AST 38 23  ALT 26 18  ALKPHOS 412* 311*  PROT 8.1 6.2*  ALBUMIN 3.3* 2.6*    TUMOR MARKERS: No results for input(s): AFPTM, CEA, CA199, CHROMGRNA in the last 8760 hours.  Assessment and Plan:  Wt loss and right abd pain Imaging revealing pancreatic mass and liver mass; few small pulmonary nodules Scheduled today for liver lesion biopsy Risks and benefits of liver lesion biopsy was discussed with the patient and/or patient's family including, but not  limited to bleeding, infection, damage to adjacent structures or low yield requiring additional tests.  All of the questions were answered and there is agreement to proceed. Consent signed and in chart.   Thank you for this interesting consult.  I greatly enjoyed meeting Kirsten Pulliam and look forward to participating in their care.  A copy of this report was sent to the requesting provider on this date.  Electronically Signed: Lavonia Drafts, PA-C 06/18/2020, 7:12 AM   I spent a total of 40 Minutes    in face to face in clinical consultation, greater than 50% of which was counseling/coordinating care for liver lesion bx

## 2020-06-18 NOTE — Consult Note (Addendum)
New Hematology/Oncology Consult   Requesting [facility]:GURKYHCW Memon      Reason for Consult: Pancreas mass, liver and lung lesions  HPI: [name] presented to emergency room with a 2-week history of right subcostal pain.  An ultrasound of the abdomen revealed multiple hypoechoic liver lesions.  A CT of the abdomen and pelvis on 06/17/2020 revealed a pancreas tail mass with evidence of pulmonary, hepatic, nodal, and probable peritoneal metastases.  There is direct invasion of the splenic hilum.  A CT chest revealed scattered pulmonary nodules consistent with metastatic disease.  She reports persistent pain in the right subcostal region.  Past medical history: 1.  G9, P2  Past surgical history: None   Current Facility-Administered Medications:  .  0.9 %  sodium chloride infusion, 250 mL, Intravenous, PRN, Vashti Hey, MD, Last Rate: 10 mL/hr at 06/17/20 1735, 250 mL at 06/17/20 1735 .  bisacodyl (DULCOLAX) EC tablet 5 mg, 5 mg, Oral, Daily PRN, Bonnell Public Tublu, MD .  magnesium citrate solution 1 Bottle, 1 Bottle, Oral, Once PRN, Jamse Arn, Dewaine Oats Tublu, MD .  morphine 2 MG/ML injection 2 mg, 2 mg, Intravenous, Q4H PRN, Jamse Arn, Dewaine Oats Tublu, MD .  ondansetron (ZOFRAN) tablet 4 mg, 4 mg, Oral, Q6H PRN **OR** ondansetron (ZOFRAN) injection 4 mg, 4 mg, Intravenous, Q6H PRN, Jamse Arn, Srobona Tublu, MD .  polyethylene glycol (MIRALAX / GLYCOLAX) packet 17 g, 17 g, Oral, Daily PRN, Bonnell Public Tublu, MD .  senna (SENOKOT) tablet 17.2 mg, 2 tablet, Oral, QHS, Bonnell Public Tublu, MD, 17.2 mg at 06/17/20 2137 .  sodium chloride flush (NS) 0.9 % injection 3 mL, 3 mL, Intravenous, Q12H, Bonnell Public Tublu, MD, 3 mL at 06/17/20 1459 .  sodium chloride flush (NS) 0.9 % injection 3 mL, 3 mL, Intravenous, PRN, Jamse Arn, Kyra Searles, MD:  . senna  2 tablet Oral QHS  . sodium chloride flush  3 mL Intravenous Q12H  :  No Known Allergies:  FH:  Her father had "stomach cancer "and is alive at age 79, a sister died of pancreas cancer.  SOCIAL HISTORY: She lives with her son in [place].  Her daughter lives in [place].  She smokes cigarettes.  She quit drinking alcohol 20 years ago.  No risk factor for HIV or hepatitis.  Review of Systems:   Positives include: Pain in the right subcostal region-pleuritic, 30 pound weight loss, constipation  A complete ROS was otherwise negative.   Physical Exam:  Blood pressure (!) 156/80, pulse 83, temperature 98.9 F (37.2 C), temperature source Oral, resp. rate 16, height 5\' 3"  (1.6 m), SpO2 96 %.  HEENT: No thrush Lungs: Decreased breath sounds with inspiratory rhonchi at the lower posterior chest bilaterally, no respiratory distress Cardiac: Regular rate and rhythm Abdomen: No hepatosplenomegaly, no mass, tender in the right upper abdomen  Vascular: No leg edema Lymph nodes: No cervical, supraclavicular, axillary, or inguinal nodes Neurologic: Alert and oriented, the motor exam appears intact in the upper and lower extremities bilaterally Skin: No rash Musculoskeletal: No spine tenderness  LABS:  Recent Labs    06/17/20 0830 06/18/20 0259  WBC 6.9 6.3  HGB 14.0 11.5*  HCT 43.3 36.0  PLT 386 306    Recent Labs    06/17/20 0830 06/18/20 0259  NA 137 135  K 4.0 3.6  CL 102 103  CO2 26 25  GLUCOSE 104* 92  BUN 9 7*  CREATININE 1.00 0.97  CALCIUM 9.6 8.9      RADIOLOGY:  CT  Angio Chest PE W/Cm &/Or Wo Cm  Result Date: 06/17/2020 CLINICAL DATA:  Metastatic pancreatic cancer.  Shortness of breath. EXAM: CT ANGIOGRAPHY CHEST WITH CONTRAST TECHNIQUE: Multidetector CT imaging of the chest was performed using the standard protocol during bolus administration of intravenous contrast. Multiplanar CT image reconstructions and MIPs were obtained to evaluate the vascular anatomy. CONTRAST:  51mL OMNIPAQUE IOHEXOL 350 MG/ML SOLN COMPARISON:  None. FINDINGS: Cardiovascular:  The heart is borderline enlarged. No pericardial effusion. The aorta is normal in caliber. No dissection. Minimal scattered atherosclerotic calcifications. The pulmonary arterial tree is fairly well opacified. No filling defects to suggest pulmonary embolism. Mediastinum/Nodes: No enlarged mediastinal hilar lymph nodes to suggest metastatic disease. The esophagus is grossly normal. Small epicardial node is noted on the right side on image 93/5. Lungs/Pleura: Scattered small pulmonary nodules consistent with pulmonary metastatic disease. The largest pulmonary lesion is at the left lung base and measures 15.5 mm. No pleural effusions or pulmonary edema. No pneumothorax. Upper Abdomen: Diffuse hepatic metastatic disease. Large centrally necrotic pancreatic tail mass. Musculoskeletal: No significant bony findings. No findings suspicious for osseous metastatic disease. Review of the MIP images confirms the above findings. IMPRESSION: 1. No CT findings for pulmonary embolism. 2. Normal caliber thoracic aorta. 3. Scattered small pulmonary nodules consistent with pulmonary metastatic disease. 4. Diffuse hepatic metastatic disease. 5. Large centrally necrotic pancreatic tail mass. 6. Aortic atherosclerosis. Aortic Atherosclerosis (ICD10-I70.0). Electronically Signed   By: Marijo Sanes M.D.   On: 06/17/2020 14:08   CT Abdomen Pelvis W Contrast  Result Date: 06/17/2020 CLINICAL DATA:  Abdominal pain, right-sided for 2 weeks. EXAM: CT ABDOMEN AND PELVIS WITH CONTRAST TECHNIQUE: Multidetector CT imaging of the abdomen and pelvis was performed using the standard protocol following bolus administration of intravenous contrast. CONTRAST:  142mL OMNIPAQUE IOHEXOL 300 MG/ML  SOLN COMPARISON:  None. FINDINGS: Lower chest: Pulmonary nodule at the left lung base measuring 16 mm. Hepatobiliary: Multiple hypodense liver masses throughout both lobes, measured at up to 4 cm in the posterior segment right lobe. The enhancement  characteristics match the presumed primary pancreatic mass.No evidence of biliary obstruction or stone. Pancreas: Hypointense mass arising from the tail of the pancreas with growth into the peripancreatic fat. The mass measures up to 4.5 cm in diameter. Spleen: Probable direct invasion of the primary pancreas mass into the hilum. The spleen is heterogeneously enhancing in the setting of chronic splenic vascular compromise. Adrenals/Urinary Tract: Negative adrenals. Marked left renal scarring more heavily affecting the lower pole. Smaller area of right lower pole renal scarring. Unremarkable bladder. Stomach/Bowel:  No obstruction. No visible inflammation. Vascular/Lymphatic: Nonenhancing splenic vessels attributed to the pancreas tumor. Enlarged periaortic lymph nodes. A low-density node right of the aorta measures 13 mm in diameter. Reproductive:Thickened appearance of the right adnexa with posterior low-density component in total measuring 3 cm. Suspect metastatic coating of the right ovary. Other: Small nodule at the low left pericolic gutter. Nodularity along the posterior and inferior stomach is likely also peritoneal. Musculoskeletal: Spinal degeneration with L4-5 anterolisthesis and stenosis. No bony metastasis is noted. 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 Direct invasion of the splenic hilum with chronic splenic vascular compromise. Electronically Signed   By: Monte Fantasia M.D.   On: 06/17/2020 10:05   US Abdomen Limited RUQ (LIVER/GB)  Result Date: 06/17/2020 CLINICAL DATA:  Right upper quadrant pain EXAM: ULTRASOUND ABDOMEN LIMITED RIGHT UPPER QUADRANT COMPARISON:  None. FINDINGS: Gallbladder: No gallstones  or wall thickening visualized. No sonographic Murphy sign noted by sonographer. Common bile duct: Diameter: 4 mm, normal Liver: Multiple (at least 5) hypoechoic lesions are identified. Largest on the  left measures 2.7 x 2.2 x 2.1 cm. Largest on the right measures 5.1 x 4.2 x 4.5 cm. Within normal limits in parenchymal echogenicity. Portal vein is patent on color Doppler imaging with normal direction of blood flow towards the liver. Other: None. IMPRESSION: Multiple hypoechoic liver lesions. Correlate with planned CT imaging. Unremarkable sonographic appearance of the gallbladder. Electronically Signed   By: Macy Mis M.D.   On: 06/17/2020 09:48    Assessment and Plan:   1.  Pancreas tail mass, liver/lung metastases, carcinomatosis 2.  Pain secondary to #1 3.  Weight loss 4.  Family history of pancreas cancer  Ms. Mcnally has a clinical history consistent with a diagnosis of metastatic pancreas cancer.  I discussed the probable diagnosis and treatment options with Ms. Bacha and her daughter.  She understands no therapy will be curative if a diagnosis of metastatic pancreas cancer is confirmed.  She is scheduled undergo a biopsy of a liver lesion today.  This will establish a diagnosis and obtain tissue for molecular testing.  She can be discharged to home when her pain is controlled with an oral narcotic regimen.  Outpatient follow-up will be scheduled at the Cancer center for next week.  Recommendations: 1.  Biopsy of a liver lesion to establish a diagnosis and obtain tissue for molecular testing 2.  Narcotic analgesics as needed for pain, increase oxycodone dose as needed to control pain 3.  Refer to interventional radiology for Port-A-Cath placement, can be done as an inpatient or outpatient 4.  Refer to genetics counselor- will be scheduled as an outpatient 5.  Outpatient follow-up at the Cancer center week of 06/24/2020  [name], MD 06/18/2020, 8:22 AM

## 2020-06-18 NOTE — Progress Notes (Signed)
PROGRESS NOTE    Kirsten [name]  OIN:[id number] DOB: [birth year] DOA: 06/17/2020 PCP: Pcp, No    Brief Narrative:  60 y/o female with no significant PMH admitted with right sided abd pain and weight loss. 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 IR consulted for biopsy, oncology also consulted. Once pain is reasonably controlled, anticipate discharge home.   Assessment & Plan:   Principal Problem:   Abdominal malignancy ([place])   Pancreatic mass with evidence of metastatic disease -s/p biopsy on 4/19 -oncology consulted, await further recs  Abdominal pain -continues to have abdominal pain which limits her ability to move. -continue on IV morphine and add oxycodone prn for pain  Constipation -last BM was 3-4 days ago -started on daily miralax -will give a dose of dulcolax  Elevated BP -suspect this is related to pain -continue to follow    DVT prophylaxis: SCDs Start: 06/17/20 1537  Code Status: full code Family Communication: discussed with daughter and sister at the bedside Disposition Plan: Status is: Inpatient  Remains inpatient appropriate because:Ongoing active pain requiring inpatient pain management   Dispo: The patient is from: Home              Anticipated d/c is to: Home              Patient currently is not medically stable to d/c.   Difficult to place patient No         Consultants:   IR  Oncology  Procedures:   4/19 US guided liver biopsy  Antimicrobials:       Subjective: Reports continued pain in her RUQ, she also feels that her abdominal muscles are intermittently contracting. No vomiting. Last BM was 3-4 days ago  Objective: Vitals:   06/18/20 1025 06/18/20 1030 06/18/20 1035 06/18/20 1052  BP: (!) 176/98 (!) 149/86 (!) 143/80 131/80  Pulse: 92 86 83 83  Resp: 18 (!) 22 (!) 24 (!) 22  Temp:    98.2 F (36.8 C)  TempSrc:    Oral  SpO2: 100% 100% 96% 98%  Height:        Intake/Output Summary (Last 24  hours) at 06/18/2020 1116 Last data filed at 06/18/2020 1031 Gross per 24 hour  Intake 737.14 ml  Output 150 ml  Net 587.14 ml   There were no vitals filed for this visit.  Examination:  General exam: Appears calm and comfortable  Respiratory system: Clear to auscultation. Respiratory effort normal. Cardiovascular system: S1 & S2 heard, RRR. No JVD, murmurs, rubs, gallops or clicks. No pedal edema. Gastrointestinal system: Abdomen is nondistended, soft and diffusely tender. No organomegaly or masses felt. Normal bowel sounds heard. Central nervous system: Alert and oriented. No focal neurological deficits. Extremities: Symmetric 5 x 5 power. Skin: No rashes, lesions or ulcers Psychiatry: Judgement and insight appear normal. Mood & affect appropriate.     Data Reviewed: I have personally reviewed following labs and imaging studies  CBC: Recent Labs  Lab 06/17/20 0830 06/18/20 0259  WBC 6.9 6.3  NEUTROABS 3.7  --   HGB 14.0 11.5*  HCT 43.3 36.0  MCV 89.8 88.5  PLT 386 709   Basic Metabolic Panel: Recent Labs  Lab 06/17/20 0830 06/18/20 0259  NA 137 135  K 4.0 3.6  CL 102 103  CO2 26 25  GLUCOSE 104* 92  BUN 9 7*  CREATININE 1.00 0.97  CALCIUM 9.6 8.9   GFR: CrCl cannot be calculated (Unknown ideal weight.). Liver Function Tests:  Recent Labs  Lab 06/17/20 0830 06/18/20 0259  AST 38 23  ALT 26 18  ALKPHOS 412* 311*  BILITOT 0.7 0.6  PROT 8.1 6.2*  ALBUMIN 3.3* 2.6*   Recent Labs  Lab 06/17/20 0830  LIPASE 43   No results for input(s): AMMONIA in the last 168 hours. Coagulation Profile: Recent Labs  Lab 06/18/20 0718  INR 1.1   Cardiac Enzymes: No results for input(s): CKTOTAL, CKMB, CKMBINDEX, TROPONINI in the last 168 hours. BNP (last 3 results) No results for input(s): PROBNP in the last 8760 hours. HbA1C: No results for input(s): HGBA1C in the last 72 hours. CBG: No results for input(s): GLUCAP in the last 168 hours. Lipid Profile: No  results for input(s): CHOL, HDL, LDLCALC, TRIG, CHOLHDL, LDLDIRECT in the last 72 hours. Thyroid Function Tests: No results for input(s): TSH, T4TOTAL, FREET4, T3FREE, THYROIDAB in the last 72 hours. Anemia Panel: No results for input(s): VITAMINB12, FOLATE, FERRITIN, TIBC, IRON, RETICCTPCT in the last 72 hours. Sepsis Labs: No results for input(s): PROCALCITON, LATICACIDVEN in the last 168 hours.  Recent Results (from the past 240 hour(s))  Resp Panel by RT-PCR (Flu A&B, Covid) Nasopharyngeal Swab     Status: None   Collection Time: 06/17/20  2:47 PM   Specimen: Nasopharyngeal Swab; Nasopharyngeal(NP) swabs in vial transport medium  Result Value Ref Range Status   SARS Coronavirus 2 by RT PCR NEGATIVE NEGATIVE Final    Comment: (NOTE) SARS-CoV-2 target nucleic acids are NOT DETECTED.  The SARS-CoV-2 RNA is generally detectable in upper respiratory specimens during the acute phase of infection. The lowest concentration of SARS-CoV-2 viral copies this assay can detect is 138 copies/mL. A negative result does not preclude SARS-Cov-2 infection and should not be used as the sole basis for treatment or other patient management decisions. A negative result may occur with  improper specimen collection/handling, submission of specimen other than nasopharyngeal swab, presence of viral mutation(s) within the areas targeted by this assay, and inadequate number of viral copies(<138 copies/mL). A negative result must be combined with clinical observations, patient history, and epidemiological information. The expected result is Negative.  Fact Sheet for Patients:  EntrepreneurPulse.com.au  Fact Sheet for Healthcare Providers:  IncredibleEmployment.be  This test is no t yet approved or cleared by the [place] FDA and  has been authorized for detection and/or diagnosis of SARS-CoV-2 by FDA under an Emergency Use Authorization (EUA). This EUA will remain   in effect (meaning this test can be used) for the duration of the COVID-19 declaration under Section 564(b)(1) of the Act, 21 U.S.C.section 360bbb-3(b)(1), unless the authorization is terminated  or revoked sooner.       Influenza A by PCR NEGATIVE NEGATIVE Final   Influenza B by PCR NEGATIVE NEGATIVE Final    Comment: (NOTE) The Xpert Xpress SARS-CoV-2/FLU/RSV plus assay is intended as an aid in the diagnosis of influenza from Nasopharyngeal swab specimens and should not be used as a sole basis for treatment. Nasal washings and aspirates are unacceptable for Xpert Xpress SARS-CoV-2/FLU/RSV testing.  Fact Sheet for Patients: EntrepreneurPulse.com.au  Fact Sheet for Healthcare Providers: IncredibleEmployment.be  This test is not yet approved or cleared by the [place] FDA and has been authorized for detection and/or diagnosis of SARS-CoV-2 by FDA under an Emergency Use Authorization (EUA). This EUA will remain in effect (meaning this test can be used) for the duration of the COVID-19 declaration under Section 564(b)(1) of the Act, 21 U.S.C. section 360bbb-3(b)(1), unless the authorization is  terminated or revoked.  Performed at [facility], [place] [address] [place], [place] [address]          Radiology Studies: CT Angio Chest PE W/Cm &/Or Wo Cm  Result Date: 06/17/2020 CLINICAL DATA:  Metastatic pancreatic cancer.  Shortness of breath. EXAM: CT ANGIOGRAPHY CHEST WITH CONTRAST TECHNIQUE: Multidetector CT imaging of the chest was performed using the standard protocol during bolus administration of intravenous contrast. Multiplanar CT image reconstructions and MIPs were obtained to evaluate the vascular anatomy. CONTRAST:  24mL OMNIPAQUE IOHEXOL 350 MG/ML SOLN COMPARISON:  None. FINDINGS: Cardiovascular: The heart is borderline enlarged. No pericardial effusion. The aorta is normal in caliber. No dissection. Minimal scattered  atherosclerotic calcifications. The pulmonary arterial tree is fairly well opacified. No filling defects to suggest pulmonary embolism. Mediastinum/Nodes: No enlarged mediastinal hilar lymph nodes to suggest metastatic disease. The esophagus is grossly normal. Small epicardial node is noted on the right side on image 93/5. Lungs/Pleura: Scattered small pulmonary nodules consistent with pulmonary metastatic disease. The largest pulmonary lesion is at the left lung base and measures 15.5 mm. No pleural effusions or pulmonary edema. No pneumothorax. Upper Abdomen: Diffuse hepatic metastatic disease. Large centrally necrotic pancreatic tail mass. Musculoskeletal: No significant bony findings. No findings suspicious for osseous metastatic disease. Review of the MIP images confirms the above findings. IMPRESSION: 1. No CT findings for pulmonary embolism. 2. Normal caliber thoracic aorta. 3. Scattered small pulmonary nodules consistent with pulmonary metastatic disease. 4. Diffuse hepatic metastatic disease. 5. Large centrally necrotic pancreatic tail mass. 6. Aortic atherosclerosis. Aortic Atherosclerosis (ICD10-I70.0). Electronically Signed   By: Marijo Sanes M.D.   On: 06/17/2020 14:08   CT Abdomen Pelvis W Contrast  Result Date: 06/17/2020 CLINICAL DATA:  Abdominal pain, right-sided for 2 weeks. EXAM: CT ABDOMEN AND PELVIS WITH CONTRAST TECHNIQUE: Multidetector CT imaging of the abdomen and pelvis was performed using the standard protocol following bolus administration of intravenous contrast. CONTRAST:  122mL OMNIPAQUE IOHEXOL 300 MG/ML  SOLN COMPARISON:  None. FINDINGS: Lower chest: Pulmonary nodule at the left lung base measuring 16 mm. Hepatobiliary: Multiple hypodense liver masses throughout both lobes, measured at up to 4 cm in the posterior segment right lobe. The enhancement characteristics match the presumed primary pancreatic mass.No evidence of biliary obstruction or stone. Pancreas: Hypointense mass  arising from the tail of the pancreas with growth into the peripancreatic fat. The mass measures up to 4.5 cm in diameter. Spleen: Probable direct invasion of the primary pancreas mass into the hilum. The spleen is heterogeneously enhancing in the setting of chronic splenic vascular compromise. Adrenals/Urinary Tract: Negative adrenals. Marked left renal scarring more heavily affecting the lower pole. Smaller area of right lower pole renal scarring. Unremarkable bladder. Stomach/Bowel:  No obstruction. No visible inflammation. Vascular/Lymphatic: Nonenhancing splenic vessels attributed to the pancreas tumor. Enlarged periaortic lymph nodes. A low-density node right of the aorta measures 13 mm in diameter. Reproductive:Thickened appearance of the right adnexa with posterior low-density component in total measuring 3 cm. Suspect metastatic coating of the right ovary. Other: Small nodule at the low left pericolic gutter. Nodularity along the posterior and inferior stomach is likely also peritoneal. Musculoskeletal: Spinal degeneration with L4-5 anterolisthesis and stenosis. No bony metastasis is noted. 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 Direct invasion of the splenic hilum with chronic splenic vascular compromise. Electronically Signed   By: Monte Fantasia M.D.   On: 06/17/2020 10:05   US  Abdomen Limited RUQ (LIVER/GB)  Result Date: 06/17/2020 CLINICAL DATA:  Right upper quadrant pain EXAM: ULTRASOUND ABDOMEN LIMITED RIGHT UPPER QUADRANT COMPARISON:  None. FINDINGS: Gallbladder: No gallstones or wall thickening visualized. No sonographic Murphy sign noted by sonographer. Common bile duct: Diameter: 4 mm, normal Liver: Multiple (at least 5) hypoechoic lesions are identified. Largest on the left measures 2.7 x 2.2 x 2.1 cm. Largest on the right measures 5.1 x 4.2 x 4.5 cm. Within normal limits in parenchymal  echogenicity. Portal vein is patent on color Doppler imaging with normal direction of blood flow towards the liver. Other: None. IMPRESSION: Multiple hypoechoic liver lesions. Correlate with planned CT imaging. Unremarkable sonographic appearance of the gallbladder. Electronically Signed   By: Macy Mis M.D.   On: 06/17/2020 09:48        Scheduled Meds: . bisacodyl  10 mg Oral Once  . polyethylene glycol  17 g Oral Daily  . senna  2 tablet Oral QHS  . sodium chloride flush  3 mL Intravenous Q12H   Continuous Infusions: . sodium chloride 250 mL (06/17/20 1735)     LOS: 1 day    Time spent: 47mins    Kathie Dike, MD [facility]   If 7PM-7AM, please contact night-coverage [email or web address]  06/18/2020, 11:16 AM

## 2020-06-18 NOTE — Plan of Care (Signed)

## 2020-06-19 LAB — SURGICAL PATHOLOGY

## 2020-06-19 MED ORDER — FENTANYL 12 MCG/HR TD PT72
1.0000 | MEDICATED_PATCH | TRANSDERMAL | Status: DC
  Administered 2020-06-19 – 2020-06-22 (×2): 1 via TRANSDERMAL
  Filled 2020-06-19 (×2): qty 1

## 2020-06-19 MED ORDER — HYDROCODONE-ACETAMINOPHEN 10-325 MG PO TABS
1.0000 | ORAL_TABLET | ORAL | Status: DC | PRN
  Administered 2020-06-20 – 2020-06-22 (×5): 1 via ORAL
  Filled 2020-06-19 (×6): qty 1

## 2020-06-19 NOTE — Progress Notes (Signed)
PROGRESS NOTE  Kirsten [name]  DOB: May 12, 1960  PCP: Kathyrn Lass OIN:[id number]  DOA: 06/17/2020  LOS: 2 days   Chief Complaint  Patient presents with  . Abdominal Pain   Brief narrative: Kirsten [name] is a 60 y.o. female with no significant PMH admitted with right sided abd pain and weight loss. 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  IR did liver biopsy on 4/19.   Oncology following.  Port-A-Cath placement pending  Subjective: Patient was seen and examined this morning.  Middle-aged African-American female.  Looks older for age.  Not in pain not in distress.  Daughter at bedside.  Assessment/Plan: Pancreatic mass with evidence of metastatic disease -s/p biopsy on 4/19. -oncology consult obtained with Dr. Benay Spice. -Port-A-Cath placement pending.  Abdominal pain -continues to have abdominal pain which limits her ability to move. -Currently on IV morphine and oral oxycodone prn for pain.  In order to facilitate discharge, I would stop IV morphine, started on fentanyl patch and increase frequency of oral Norco as needed.  Constipation -On daily MiraLAX and as needed Dulcolax  Mobility: Encourage ambulation Code Status:   Code Status: Full Code  Nutritional status: There is no height or weight on file to calculate BMI.     Diet Order            Diet regular Room service appropriate? Yes; Fluid consistency: Thin  Diet effective now                 DVT prophylaxis: SCDs Start: 06/17/20 1537   Antimicrobials:  None Fluid: Not on IV fluid Consultants: IR, oncology Family Communication:  Daughter at bedside  Status is: Inpatient  Remains inpatient appropriate because: Pending Port-A-Cath placement  Dispo: The patient is from: Home              Anticipated d/c is to: Home              Patient currently is not medically stable to d/c.   Difficult to place patient No       Infusions:  . sodium chloride 250 mL (06/17/20 1735)     Scheduled Meds: . fentaNYL  1 patch Transdermal Q72H  . polyethylene glycol  17 g Oral Daily  . senna  2 tablet Oral QHS  . sodium chloride flush  3 mL Intravenous Q12H    Antimicrobials: Anti-infectives (From admission, onward)   None      PRN meds: sodium chloride, bisacodyl, HYDROcodone-acetaminophen, magnesium citrate, ondansetron **OR** ondansetron (ZOFRAN) IV, sodium chloride flush   Objective: Vitals:   06/18/20 2055 06/19/20 0638  BP: (!) 146/83 (!) 147/87  Pulse: 91 99  Resp: 17 17  Temp: 99 F (37.2 C) 98.6 F (37 C)  SpO2: 93% 98%    Intake/Output Summary (Last 24 hours) at 06/19/2020 1410 Last data filed at 06/19/2020 1051 Gross per 24 hour  Intake 499.68 ml  Output 202 ml  Net 297.68 ml   There were no vitals filed for this visit. Weight change:  There is no height or weight on file to calculate BMI.   Physical Exam: General exam: Present middle-aged African-American female, looks older for her age Skin: No rashes, lesions or ulcers. HEENT: Atraumatic, normocephalic, no obvious bleeding Lungs: Clear to auscultation bilaterally CVS: Regular rate and rhythm, no murmur GI/Abd soft, nontender, nondistended, bowel sound present CNS: Alert, awake, seems tired Psychiatry: Depressed look Extremities: No pedal edema, no calf tenderness  Data Review: I have personally reviewed the  laboratory data and studies available.  Recent Labs  Lab 06/17/20 0830 06/18/20 0259  WBC 6.9 6.3  NEUTROABS 3.7  --   HGB 14.0 11.5*  HCT 43.3 36.0  MCV 89.8 88.5  PLT 386 306   Recent Labs  Lab 06/17/20 0830 06/18/20 0259  NA 137 135  K 4.0 3.6  CL 102 103  CO2 26 25  GLUCOSE 104* 92  BUN 9 7*  CREATININE 1.00 0.97  CALCIUM 9.6 8.9    F/u labs ordered Unresulted Labs (From admission, onward)         None      Signed, Terrilee Croak, MD [facility] 06/19/2020

## 2020-06-19 NOTE — Progress Notes (Signed)
Referring Physician(s): Sherrill,B  Supervising Physician: Markus Daft  Patient Status:  [facility] IP  Chief Complaint:  Pancreatic cancer  Subjective: Pt familiar to IR service from liver mass biopsy yesterday. She has newly diagnosed metastatic pancreatic cancer. Request now received from oncology for port a cath placement for palliative chemotherapy. She currently denies fever,HA,CP, cough, back pain, or bleeding. She does have RUQ/epigastric discomfort, occ nausea, weight loss. Additional hx as below. Latest temp 99.1, COVID -19 neg; WBC nl; hgb 11.5, plts nl; creat nl.      Allergies: Patient has no known allergies.  Medications: Prior to Admission medications   Medication Sig Start Date End Date Taking? Authorizing Provider  Aspirin-Salicylamide-Caffeine (BC HEADACHE POWDER PO) Take 1 packet by mouth daily as needed (PAIN).   Yes [provider]     Vital Signs: BP (!) 150/78 (BP Location: Right Arm)   Pulse 92   Temp 99.1 F (37.3 C) (Oral)   Resp 19   Ht 5\' 3"  (1.6 m)   LMP  (LMP Unknown) Comment: Pt unaware when MP stopped  SpO2 97%   Physical Exam awake/answering questions ok; chest- CTA bilat ant; heart- RRR; abd- soft,+BS, mildy tender RUQ/epigastric region; no sig LE edema  Imaging: CT Angio Chest PE W/Cm &/Or Wo Cm  Result Date: 06/17/2020 CLINICAL DATA:  Metastatic pancreatic cancer.  Shortness of breath. EXAM: CT ANGIOGRAPHY CHEST WITH CONTRAST TECHNIQUE: Multidetector CT imaging of the chest was performed using the standard protocol during bolus administration of intravenous contrast. Multiplanar CT image reconstructions and MIPs were obtained to evaluate the vascular anatomy. CONTRAST:  40mL OMNIPAQUE IOHEXOL 350 MG/ML SOLN COMPARISON:  None. FINDINGS: Cardiovascular: The heart is borderline enlarged. No pericardial effusion. The aorta is normal in caliber. No dissection. Minimal scattered atherosclerotic calcifications. The pulmonary arterial tree  is fairly well opacified. No filling defects to suggest pulmonary embolism. Mediastinum/Nodes: No enlarged mediastinal hilar lymph nodes to suggest metastatic disease. The esophagus is grossly normal. Small epicardial node is noted on the right side on image 93/5. Lungs/Pleura: Scattered small pulmonary nodules consistent with pulmonary metastatic disease. The largest pulmonary lesion is at the left lung base and measures 15.5 mm. No pleural effusions or pulmonary edema. No pneumothorax. Upper Abdomen: Diffuse hepatic metastatic disease. Large centrally necrotic pancreatic tail mass. Musculoskeletal: No significant bony findings. No findings suspicious for osseous metastatic disease. Review of the MIP images confirms the above findings. IMPRESSION: 1. No CT findings for pulmonary embolism. 2. Normal caliber thoracic aorta. 3. Scattered small pulmonary nodules consistent with pulmonary metastatic disease. 4. Diffuse hepatic metastatic disease. 5. Large centrally necrotic pancreatic tail mass. 6. Aortic atherosclerosis. Aortic Atherosclerosis (ICD10-I70.0). Electronically Signed   By: Marijo Sanes M.D.   On: 06/17/2020 14:08   CT Abdomen Pelvis W Contrast  Result Date: 06/17/2020 CLINICAL DATA:  Abdominal pain, right-sided for 2 weeks. EXAM: CT ABDOMEN AND PELVIS WITH CONTRAST TECHNIQUE: Multidetector CT imaging of the abdomen and pelvis was performed using the standard protocol following bolus administration of intravenous contrast. CONTRAST:  159mL OMNIPAQUE IOHEXOL 300 MG/ML  SOLN COMPARISON:  None. FINDINGS: Lower chest: Pulmonary nodule at the left lung base measuring 16 mm. Hepatobiliary: Multiple hypodense liver masses throughout both lobes, measured at up to 4 cm in the posterior segment right lobe. The enhancement characteristics match the presumed primary pancreatic mass.No evidence of biliary obstruction or stone. Pancreas: Hypointense mass arising from the tail of the pancreas with growth into the  peripancreatic fat. The mass measures  up to 4.5 cm in diameter. Spleen: Probable direct invasion of the primary pancreas mass into the hilum. The spleen is heterogeneously enhancing in the setting of chronic splenic vascular compromise. Adrenals/Urinary Tract: Negative adrenals. Marked left renal scarring more heavily affecting the lower pole. Smaller area of right lower pole renal scarring. Unremarkable bladder. Stomach/Bowel:  No obstruction. No visible inflammation. Vascular/Lymphatic: Nonenhancing splenic vessels attributed to the pancreas tumor. Enlarged periaortic lymph nodes. A low-density node right of the aorta measures 13 mm in diameter. Reproductive:Thickened appearance of the right adnexa with posterior low-density component in total measuring 3 cm. Suspect metastatic coating of the right ovary. Other: Small nodule at the low left pericolic gutter. Nodularity along the posterior and inferior stomach is likely also peritoneal. Musculoskeletal: Spinal degeneration with L4-5 anterolisthesis and stenosis. No bony metastasis is noted. 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 Direct invasion of the splenic hilum with chronic splenic vascular compromise. Electronically Signed   By: Monte Fantasia M.D.   On: 06/17/2020 10:05   US BIOPSY (LIVER)  Result Date: 06/18/2020 INDICATION: 60 year old female with history of abdominal malignancy and multiple hepatic masses. EXAM: ULTRASOUND GUIDED LIVER LESION BIOPSY COMPARISON:  06/17/2020 MEDICATIONS: None ANESTHESIA/SEDATION: Fentanyl 50 mcg IV; Versed 1 mg IV Total Moderate Sedation time:  14 minutes. The patient's level of consciousness and vital signs were monitored continuously by radiology nursing throughout the procedure under my direct supervision. COMPLICATIONS: None immediate. PROCEDURE: Informed written consent was obtained from the patient after a discussion of  the risks, benefits and alternatives to treatment. The patient understands and consents the procedure. A timeout was performed prior to the initiation of the procedure. Ultrasound scanning was performed of the right upper abdominal quadrant demonstrates innumerable hypoechoic hepatic masses. A favorable mass for biopsy in the left lobe was targeted. A left lobe mass measuring approximately 1.5 cm was selected for biopsy and the procedure was planned. The right upper abdominal quadrant was prepped and draped in the usual sterile fashion. The overlying soft tissues were anesthetized with 1% lidocaine with epinephrine. A 17 gauge, 6.8 cm co-axial needle was advanced into a peripheral aspect of the lesion. This was followed by 3 core biopsies with an 18 gauge core device under direct ultrasound guidance. The coaxial needle tract was embolized with a small amount of Gel-Foam slurry and superficial hemostasis was obtained with manual compression. Post procedural scanning was negative for definitive area of hemorrhage or additional complication. A dressing was placed. The patient tolerated the procedure well without immediate post procedural complication. IMPRESSION: Technically successful ultrasound guided core needle biopsy of left lobe hepatic mass. Ruthann Cancer, MD [facility] Electronically Signed   By: Ruthann Cancer MD   On: 06/18/2020 14:17   US Abdomen Limited RUQ (LIVER/GB)  Result Date: 06/17/2020 CLINICAL DATA:  Right upper quadrant pain EXAM: ULTRASOUND ABDOMEN LIMITED RIGHT UPPER QUADRANT COMPARISON:  None. FINDINGS: Gallbladder: No gallstones or wall thickening visualized. No sonographic Murphy sign noted by sonographer. Common bile duct: Diameter: 4 mm, normal Liver: Multiple (at least 5) hypoechoic lesions are identified. Largest on the left measures 2.7 x 2.2 x 2.1 cm. Largest on the right measures 5.1 x 4.2 x 4.5 cm. Within normal limits in  parenchymal echogenicity. Portal vein is patent on color Doppler imaging with normal direction of blood flow towards the liver. Other: None. IMPRESSION: Multiple hypoechoic liver lesions. Correlate with planned CT imaging. Unremarkable  sonographic appearance of the gallbladder. Electronically Signed   By: Macy Mis M.D.   On: 06/17/2020 09:48    Labs:  CBC: Recent Labs    06/17/20 0830 06/18/20 0259  WBC 6.9 6.3  HGB 14.0 11.5*  HCT 43.3 36.0  PLT 386 306    COAGS: Recent Labs    06/18/20 0718  INR 1.1    BMP: Recent Labs    06/17/20 0830 06/18/20 0259  NA 137 135  K 4.0 3.6  CL 102 103  CO2 26 25  GLUCOSE 104* 92  BUN 9 7*  CALCIUM 9.6 8.9  CREATININE 1.00 0.97  GFRNONAA >60 >60    LIVER FUNCTION TESTS: Recent Labs    06/17/20 0830 06/18/20 0259  BILITOT 0.7 0.6  AST 38 23  ALT 26 18  ALKPHOS 412* 311*  PROT 8.1 6.2*  ALBUMIN 3.3* 2.6*    Assessment and Plan: Pt with hx newly diagnosed metastatic pancreatic cancer; request received for port a cath placement for palliative chemotherapy.Risks and benefits of image guided port-a-catheter placement was discussed with the patient/daughter including, but not limited to bleeding, infection, pneumothorax, or fibrin sheath development and need for additional procedures.  All of the patient's questions were answered, patient is agreeable to proceed. Consent signed and in chart.  Procedure tent scheduled for 4/21   Electronically Signed: D. Rowe Robert, PA-C 06/19/2020, 2:32 PM   I spent a total of 25 minutes at the the patient's bedside AND on the patient's hospital floor or unit, greater than 50% of which was counseling/coordinating care for port a cath placement    Patient ID: Kirsten [name], female   DOB: [birth year], 60 y.o.   MRN: [id number]

## 2020-06-20 ENCOUNTER — Encounter: Payer: Self-pay | Admitting: *Deleted

## 2020-06-20 ENCOUNTER — Inpatient Hospital Stay (HOSPITAL_COMMUNITY): Payer: 59

## 2020-06-20 HISTORY — PX: IR IMAGING GUIDED PORT INSERTION: IMG5740

## 2020-06-20 LAB — CBC WITH DIFFERENTIAL/PLATELET
Abs Immature Granulocytes: 0.01 10*3/uL (ref 0.00–0.07)
Basophils Absolute: 0 10*3/uL (ref 0.0–0.1)
Basophils Relative: 0 %
Eosinophils Absolute: 0 10*3/uL (ref 0.0–0.5)
Eosinophils Relative: 1 %
HCT: 33.4 % — ABNORMAL LOW (ref 36.0–46.0)
Hemoglobin: 10.8 g/dL — ABNORMAL LOW (ref 12.0–15.0)
Immature Granulocytes: 0 %
Lymphocytes Relative: 32 %
Lymphs Abs: 2.2 10*3/uL (ref 0.7–4.0)
MCH: 28.3 pg (ref 26.0–34.0)
MCHC: 32.3 g/dL (ref 30.0–36.0)
MCV: 87.7 fL (ref 80.0–100.0)
Monocytes Absolute: 0.7 10*3/uL (ref 0.1–1.0)
Monocytes Relative: 10 %
Neutro Abs: 3.9 10*3/uL (ref 1.7–7.7)
Neutrophils Relative %: 57 %
Platelets: 301 10*3/uL (ref 150–400)
RBC: 3.81 MIL/uL — ABNORMAL LOW (ref 3.87–5.11)
RDW: 12.8 % (ref 11.5–15.5)
WBC: 6.9 10*3/uL (ref 4.0–10.5)
nRBC: 0 % (ref 0.0–0.2)

## 2020-06-20 MED ORDER — HEPARIN SOD (PORK) LOCK FLUSH 100 UNIT/ML IV SOLN
INTRAVENOUS | Status: AC | PRN
  Administered 2020-06-20: 500 [IU] via INTRAVENOUS

## 2020-06-20 MED ORDER — MIDAZOLAM HCL 2 MG/2ML IJ SOLN
INTRAMUSCULAR | Status: AC | PRN
  Administered 2020-06-20 (×3): 1 mg via INTRAVENOUS

## 2020-06-20 MED ORDER — HYDROCODONE-ACETAMINOPHEN 10-325 MG PO TABS: 1.0000 | ORAL_TABLET | Freq: Four times a day (QID) | ORAL | 0 refills | Status: DC | PRN

## 2020-06-20 MED ORDER — SODIUM CHLORIDE 0.9 % IV SOLN
INTRAVENOUS | Status: AC | PRN
  Administered 2020-06-20: 250 mL via INTRAVENOUS

## 2020-06-20 MED ORDER — HEPARIN SOD (PORK) LOCK FLUSH 100 UNIT/ML IV SOLN
INTRAVENOUS | Status: AC
  Filled 2020-06-20: qty 5

## 2020-06-20 MED ORDER — FENTANYL CITRATE (PF) 100 MCG/2ML IJ SOLN
INTRAMUSCULAR | Status: AC
  Filled 2020-06-20: qty 2

## 2020-06-20 MED ORDER — LIDOCAINE-EPINEPHRINE 1 %-1:100000 IJ SOLN
INTRAMUSCULAR | Status: AC | PRN
  Administered 2020-06-20: 10 mL

## 2020-06-20 MED ORDER — MIDAZOLAM HCL 2 MG/2ML IJ SOLN
INTRAMUSCULAR | Status: AC
  Filled 2020-06-20: qty 2

## 2020-06-20 MED ORDER — FENTANYL CITRATE (PF) 100 MCG/2ML IJ SOLN
INTRAMUSCULAR | Status: AC | PRN
  Administered 2020-06-20: 50 ug via INTRAVENOUS
  Administered 2020-06-20: 25 ug via INTRAVENOUS
  Administered 2020-06-20: 50 ug via INTRAVENOUS

## 2020-06-20 MED ORDER — LIDOCAINE-EPINEPHRINE 1 %-1:100000 IJ SOLN
INTRAMUSCULAR | Status: AC
  Filled 2020-06-20: qty 1

## 2020-06-20 MED ORDER — SODIUM CHLORIDE 0.9 % IV SOLN
INTRAVENOUS | Status: DC
  Administered 2020-06-20: 490 mL via INTRAVENOUS

## 2020-06-20 MED ORDER — SENNA 8.6 MG PO TABS: 2.0000 | ORAL_TABLET | Freq: Every day | ORAL | 2 refills | Status: AC

## 2020-06-20 MED ORDER — LIDOCAINE HCL 1 % IJ SOLN
INTRAMUSCULAR | Status: AC | PRN
  Administered 2020-06-20: 10 mL

## 2020-06-20 MED ORDER — LIDOCAINE HCL 1 % IJ SOLN
INTRAMUSCULAR | Status: AC
  Filled 2020-06-20: qty 20

## 2020-06-20 MED ORDER — BISACODYL 5 MG PO TBEC: 5.0000 mg | DELAYED_RELEASE_TABLET | Freq: Every day | ORAL | 0 refills | Status: DC | PRN

## 2020-06-20 MED ORDER — POLYETHYLENE GLYCOL 3350 17 G PO PACK: 17.0000 g | PACK | Freq: Every day | ORAL | 0 refills | Status: AC

## 2020-06-20 MED ORDER — FENTANYL 12 MCG/HR TD PT72: 1.0000 | MEDICATED_PATCH | TRANSDERMAL | 0 refills | Status: DC

## 2020-06-20 NOTE — Discharge Summary (Signed)
Physician Discharge Summary  Kirsten Clinch XNT:[id number] DOB: 03-04-1960 DOA: 06/17/2020  PCP: Pcp, No  Admit date: 06/17/2020 Discharge date: 06/20/2020  Admitted From: Home Discharge disposition: Home with PT   Code Status: Full Code  Diet Recommendation: Regular diet  Discharge Diagnosis:   Principal Problem:   Abdominal malignancy [facility])   Chief Complaint  Patient presents with  . Abdominal Pain   Brief narrative: Kirsten [name] is a 60 y.o. female with no significant PMH admitted with right sided abd pain and weight loss. 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  See below for details  Subjective: Patient was seen and examined this morning.  Middle-aged African-American female.  Looks older for age.  Not in pain not in distress.  Daughter at bedside.  Assessment/Plan: Pancreatic mass with evidence of metastatic disease IR did liver biopsy on 4/19 and Port-A-Cath placement done 4/21 Seen by oncology.  Plan for continuation of oncology care as an outpatient  Abdominal pain -continues to have abdominal pain which limits her ability to move. -Switched from IV to oral pain medicine yesterday.  We will discharge her home on fentanyl patch and oral Norco.  To follow-up with oncology for further pain management  Constipation -On daily MiraLAX and as needed Dulcolax.  To continue the same at home while on opioids.   Wound care: Wound / Incision (Open or Dehisced) 06/18/20 Puncture Abdomen Mid liver biopsy site (Active)  Date First Assessed/Time First Assessed: 06/18/20 1027   Wound Type: Puncture  Location: Abdomen  Location Orientation: Mid  Wound Description (Comments): liver biopsy site  Present on Admission: No    Assessments 06/18/2020 10:30 AM 06/19/2020  7:50 PM  Dressing Type Adhesive bandage Adhesive bandage  Dressing Changed New --  Dressing Status Clean;Dry;Intact --  Site / Wound Assessment -- Dressing in place / Unable to assess   Peri-wound Assessment Intact --  Margins Attached edges (approximated) --  Closure None --  Drainage Amount None --  Treatment Cleansed --     No Linked orders to display    Discharge Exam:   Vitals:   06/20/20 1030 06/20/20 1035 06/20/20 1040 06/20/20 1045  BP: (!) 159/88 (!) 149/86 134/79 113/74  Pulse: 87 89 90 94  Resp: 14 (!) 23 (!) 21 19  Temp:      TempSrc:      SpO2: 99% 97% 98% 98%  Height:        There is no height or weight on file to calculate BMI.  General exam: Present middle-aged African-American female, looks older for her age Skin: No rashes, lesions or ulcers. HEENT: Atraumatic, normocephalic, no obvious bleeding Lungs: Clear to auscultation bilaterally CVS: Regular rate and rhythm, no murmur GI/Abd soft, nontender, nondistended, bowel sound present CNS: Alert, awake, seems tired Psychiatry: Depressed look Extremities: No pedal edema, no calf tenderness  Follow ups:   Discharge Instructions    Diet general   Complete by: As directed    Increase activity slowly   Complete by: As directed    No wound care   Complete by: As directed        Recommendations for Outpatient Follow-Up:   1. Follow-up with oncologist at cancer center as an outpatient  Discharge Instructions:  Follow with Primary MD Pcp, No in 7 days   Get CBC/BMP checked in next visit within 1 week by PCP or SNF MD ( we routinely change or add medications that can affect your baseline labs and fluid status,  therefore we recommend that you get the mentioned basic workup next visit with your PCP, your PCP may decide not to get them or add new tests based on their clinical decision)  On your next visit with your PCP, please Get Medicines reviewed and adjusted.  Please request your PCP  to go over all Hospital Tests and Procedure/Radiological results at the follow up, please get all Hospital records sent to your Prim MD by signing hospital release before you go home.  Activity: As  tolerated with Full fall precautions use walker/cane & assistance as needed  For Heart failure patients - Check your Weight same time everyday, if you gain over 2 pounds, or you develop in leg swelling, experience more shortness of breath or chest pain, call your Primary MD immediately. Follow Cardiac Low Salt Diet and 1.5 lit/day fluid restriction.  If you have smoked or chewed Tobacco in the last 2 yrs please stop smoking, stop any regular Alcohol  and or any Recreational drug use.  If you experience worsening of your admission symptoms, develop shortness of breath, life threatening emergency, suicidal or homicidal thoughts you must seek medical attention immediately by calling 911 or calling your MD immediately  if symptoms less severe.  You Must read complete instructions/literature along with all the possible adverse reactions/side effects for all the Medicines you take and that have been prescribed to you. Take any new Medicines after you have completely understood and accpet all the possible adverse reactions/side effects.   Do not drive, operate heavy machinery, perform activities at heights, swimming or participation in water activities or provide baby sitting services if your were admitted for syncope or siezures until you have seen by Primary MD or a Neurologist and advised to do so again.  Do not drive when taking Pain medications.  Do not take more than prescribed Pain, Sleep and Anxiety Medications  Wear Seat belts while driving.   Please note You were cared for by a hospitalist during your hospital stay. If you have any questions about your discharge medications or the care you received while you were in the hospital after you are discharged, you can call the unit and asked to speak with the hospitalist on call if the hospitalist that took care of you is not available. Once you are discharged, your primary care physician will handle any further medical issues. Please note that NO  REFILLS for any discharge medications will be authorized once you are discharged, as it is imperative that you return to your primary care physician (or establish a relationship with a primary care physician if you do not have one) for your aftercare needs so that they can reassess your need for medications and monitor your lab values.    Allergies as of 06/20/2020   No Known Allergies     Medication List    TAKE these medications   BC HEADACHE POWDER PO Take 1 packet by mouth daily as needed (PAIN).   bisacodyl 5 MG EC tablet Commonly known as: DULCOLAX Take 1 tablet (5 mg total) by mouth daily as needed for moderate constipation.   fentaNYL 12 MCG/HR Commonly known as: [place] 1 patch onto the skin every 3 (three) days for 15 days. Start taking on: June 22, 2020   HYDROcodone-acetaminophen 10-325 MG tablet Commonly known as: NORCO Take 1 tablet by mouth every 6 (six) hours as needed for up to 5 days for moderate pain.   polyethylene glycol 17 g packet Commonly known as: MIRALAX /  GLYCOLAX Take 17 g by mouth daily.   senna 8.6 MG Tabs tablet Commonly known as: SENOKOT Take 2 tablets (17.2 mg total) by mouth at bedtime.       Time coordinating discharge: 35 minutes  The results of significant diagnostics from this hospitalization (including imaging, microbiology, ancillary and laboratory) are listed below for reference.    Procedures and Diagnostic Studies:   CT Angio Chest PE W/Cm &/Or Wo Cm  Result Date: 06/17/2020 CLINICAL DATA:  Metastatic pancreatic cancer.  Shortness of breath. EXAM: CT ANGIOGRAPHY CHEST WITH CONTRAST TECHNIQUE: Multidetector CT imaging of the chest was performed using the standard protocol during bolus administration of intravenous contrast. Multiplanar CT image reconstructions and MIPs were obtained to evaluate the vascular anatomy. CONTRAST:  97mL OMNIPAQUE IOHEXOL 350 MG/ML SOLN COMPARISON:  None. FINDINGS: Cardiovascular: The heart is  borderline enlarged. No pericardial effusion. The aorta is normal in caliber. No dissection. Minimal scattered atherosclerotic calcifications. The pulmonary arterial tree is fairly well opacified. No filling defects to suggest pulmonary embolism. Mediastinum/Nodes: No enlarged mediastinal hilar lymph nodes to suggest metastatic disease. The esophagus is grossly normal. Small epicardial node is noted on the right side on image 93/5. Lungs/Pleura: Scattered small pulmonary nodules consistent with pulmonary metastatic disease. The largest pulmonary lesion is at the left lung base and measures 15.5 mm. No pleural effusions or pulmonary edema. No pneumothorax. Upper Abdomen: Diffuse hepatic metastatic disease. Large centrally necrotic pancreatic tail mass. Musculoskeletal: No significant bony findings. No findings suspicious for osseous metastatic disease. Review of the MIP images confirms the above findings. IMPRESSION: 1. No CT findings for pulmonary embolism. 2. Normal caliber thoracic aorta. 3. Scattered small pulmonary nodules consistent with pulmonary metastatic disease. 4. Diffuse hepatic metastatic disease. 5. Large centrally necrotic pancreatic tail mass. 6. Aortic atherosclerosis. Aortic Atherosclerosis (ICD10-I70.0). Electronically Signed   By: Marijo Sanes M.D.   On: 06/17/2020 14:08   CT Abdomen Pelvis W Contrast  Result Date: 06/17/2020 CLINICAL DATA:  Abdominal pain, right-sided for 2 weeks. EXAM: CT ABDOMEN AND PELVIS WITH CONTRAST TECHNIQUE: Multidetector CT imaging of the abdomen and pelvis was performed using the standard protocol following bolus administration of intravenous contrast. CONTRAST:  178mL OMNIPAQUE IOHEXOL 300 MG/ML  SOLN COMPARISON:  None. FINDINGS: Lower chest: Pulmonary nodule at the left lung base measuring 16 mm. Hepatobiliary: Multiple hypodense liver masses throughout both lobes, measured at up to 4 cm in the posterior segment right lobe. The enhancement characteristics match  the presumed primary pancreatic mass.No evidence of biliary obstruction or stone. Pancreas: Hypointense mass arising from the tail of the pancreas with growth into the peripancreatic fat. The mass measures up to 4.5 cm in diameter. Spleen: Probable direct invasion of the primary pancreas mass into the hilum. The spleen is heterogeneously enhancing in the setting of chronic splenic vascular compromise. Adrenals/Urinary Tract: Negative adrenals. Marked left renal scarring more heavily affecting the lower pole. Smaller area of right lower pole renal scarring. Unremarkable bladder. Stomach/Bowel:  No obstruction. No visible inflammation. Vascular/Lymphatic: Nonenhancing splenic vessels attributed to the pancreas tumor. Enlarged periaortic lymph nodes. A low-density node right of the aorta measures 13 mm in diameter. Reproductive:Thickened appearance of the right adnexa with posterior low-density component in total measuring 3 cm. Suspect metastatic coating of the right ovary. Other: Small nodule at the low left pericolic gutter. Nodularity along the posterior and inferior stomach is likely also peritoneal. Musculoskeletal: Spinal degeneration with L4-5 anterolisthesis and stenosis. No bony metastasis is noted. IMPRESSION: Findings of adenocarcinoma in  the pancreas tail with pulmonary, hepatic, nodal, and likely peritoneal metastatic disease. Direct invasion of the splenic hilum with chronic splenic vascular compromise. Electronically Signed   By: Monte Fantasia M.D.   On: 06/17/2020 10:05   US BIOPSY (LIVER)  Result Date: 06/18/2020 INDICATION: 60 year old female with history of abdominal malignancy and multiple hepatic masses. EXAM: ULTRASOUND GUIDED LIVER LESION BIOPSY COMPARISON:  06/17/2020 MEDICATIONS: None ANESTHESIA/SEDATION: Fentanyl 50 mcg IV; Versed 1 mg IV Total Moderate Sedation time:  14 minutes. The patient's level of consciousness and vital signs were monitored continuously by radiology nursing  throughout the procedure under my direct supervision. COMPLICATIONS: None immediate. PROCEDURE: Informed written consent was obtained from the patient after a discussion of the risks, benefits and alternatives to treatment. The patient understands and consents the procedure. A timeout was performed prior to the initiation of the procedure. Ultrasound scanning was performed of the right upper abdominal quadrant demonstrates innumerable hypoechoic hepatic masses. A favorable mass for biopsy in the left lobe was targeted. A left lobe mass measuring approximately 1.5 cm was selected for biopsy and the procedure was planned. The right upper abdominal quadrant was prepped and draped in the usual sterile fashion. The overlying soft tissues were anesthetized with 1% lidocaine with epinephrine. A 17 gauge, 6.8 cm co-axial needle was advanced into a peripheral aspect of the lesion. This was followed by 3 core biopsies with an 18 gauge core device under direct ultrasound guidance. The coaxial needle tract was embolized with a small amount of Gel-Foam slurry and superficial hemostasis was obtained with manual compression. Post procedural scanning was negative for definitive area of hemorrhage or additional complication. A dressing was placed. The patient tolerated the procedure well without immediate post procedural complication. IMPRESSION: Technically successful ultrasound guided core needle biopsy of left lobe hepatic mass. Ruthann Cancer, MD [facility] Electronically Signed   By: Ruthann Cancer MD   On: 06/18/2020 14:17   US Abdomen Limited RUQ (LIVER/GB)  Result Date: 06/17/2020 CLINICAL DATA:  Right upper quadrant pain EXAM: ULTRASOUND ABDOMEN LIMITED RIGHT UPPER QUADRANT COMPARISON:  None. FINDINGS: Gallbladder: No gallstones or wall thickening visualized. No sonographic Murphy sign noted by sonographer. Common bile duct: Diameter: 4 mm, normal Liver: Multiple  (at least 5) hypoechoic lesions are identified. Largest on the left measures 2.7 x 2.2 x 2.1 cm. Largest on the right measures 5.1 x 4.2 x 4.5 cm. Within normal limits in parenchymal echogenicity. Portal vein is patent on color Doppler imaging with normal direction of blood flow towards the liver. Other: None. IMPRESSION: Multiple hypoechoic liver lesions. Correlate with planned CT imaging. Unremarkable sonographic appearance of the gallbladder. Electronically Signed   By: Macy Mis M.D.   On: 06/17/2020 09:48     Labs:   Basic Metabolic Panel: Recent Labs  Lab 06/17/20 0830 06/18/20 0259  NA 137 135  K 4.0 3.6  CL 102 103  CO2 26 25  GLUCOSE 104* 92  BUN 9 7*  CREATININE 1.00 0.97  CALCIUM 9.6 8.9   GFR CrCl cannot be calculated (Unknown ideal weight.). Liver Function Tests: Recent Labs  Lab 06/17/20 0830 06/18/20 0259  AST 38 23  ALT 26 18  ALKPHOS 412* 311*  BILITOT 0.7 0.6  PROT 8.1 6.2*  ALBUMIN 3.3* 2.6*   Recent Labs  Lab 06/17/20 0830  LIPASE 43   No results for input(s): AMMONIA in the last 168 hours. Coagulation profile Recent Labs  Lab 06/18/20 0718  INR  1.1    CBC: Recent Labs  Lab 06/17/20 0830 06/18/20 0259 06/20/20 0149  WBC 6.9 6.3 6.9  NEUTROABS 3.7  --  3.9  HGB 14.0 11.5* 10.8*  HCT 43.3 36.0 33.4*  MCV 89.8 88.5 87.7  PLT 386 306 301   Cardiac Enzymes: No results for input(s): CKTOTAL, CKMB, CKMBINDEX, TROPONINI in the last 168 hours. BNP: Invalid input(s): POCBNP CBG: No results for input(s): GLUCAP in the last 168 hours. D-Dimer No results for input(s): DDIMER in the last 72 hours. Hgb A1c No results for input(s): HGBA1C in the last 72 hours. Lipid Profile No results for input(s): CHOL, HDL, LDLCALC, TRIG, CHOLHDL, LDLDIRECT in the last 72 hours. Thyroid function studies No results for input(s): TSH, T4TOTAL, T3FREE, THYROIDAB in the last 72 hours.  Invalid input(s): FREET3 Anemia work up No results for input(s):  VITAMINB12, FOLATE, FERRITIN, TIBC, IRON, RETICCTPCT in the last 72 hours. Microbiology Recent Results (from the past 240 hour(s))  Resp Panel by RT-PCR (Flu A&B, Covid) Nasopharyngeal Swab     Status: None   Collection Time: 06/17/20  2:47 PM   Specimen: Nasopharyngeal Swab; Nasopharyngeal(NP) swabs in vial transport medium  Result Value Ref Range Status   SARS Coronavirus 2 by RT PCR NEGATIVE NEGATIVE Final    Comment: (NOTE) SARS-CoV-2 target nucleic acids are NOT DETECTED.  The SARS-CoV-2 RNA is generally detectable in upper respiratory specimens during the acute phase of infection. The lowest concentration of SARS-CoV-2 viral copies this assay can detect is 138 copies/mL. A negative result does not preclude SARS-Cov-2 infection and should not be used as the sole basis for treatment or other patient management decisions. A negative result may occur with  improper specimen collection/handling, submission of specimen other than nasopharyngeal swab, presence of viral mutation(s) within the areas targeted by this assay, and inadequate number of viral copies(<138 copies/mL). A negative result must be combined with clinical observations, patient history, and epidemiological information. The expected result is Negative.  Fact Sheet for Patients:  EntrepreneurPulse.com.au  Fact Sheet for Healthcare Providers:  IncredibleEmployment.be  This test is no t yet approved or cleared by the [place] FDA and  has been authorized for detection and/or diagnosis of SARS-CoV-2 by FDA under an Emergency Use Authorization (EUA). This EUA will remain  in effect (meaning this test can be used) for the duration of the COVID-19 declaration under Section 564(b)(1) of the Act, 21 U.S.C.section 360bbb-3(b)(1), unless the authorization is terminated  or revoked sooner.       Influenza A by PCR NEGATIVE NEGATIVE Final   Influenza B by PCR NEGATIVE NEGATIVE Final     Comment: (NOTE) The Xpert Xpress SARS-CoV-2/FLU/RSV plus assay is intended as an aid in the diagnosis of influenza from Nasopharyngeal swab specimens and should not be used as a sole basis for treatment. Nasal washings and aspirates are unacceptable for Xpert Xpress SARS-CoV-2/FLU/RSV testing.  Fact Sheet for Patients: EntrepreneurPulse.com.au  Fact Sheet for Healthcare Providers: IncredibleEmployment.be  This test is not yet approved or cleared by the [place] FDA and has been authorized for detection and/or diagnosis of SARS-CoV-2 by FDA under an Emergency Use Authorization (EUA). This EUA will remain in effect (meaning this test can be used) for the duration of the COVID-19 declaration under Section 564(b)(1) of the Act, 21 U.S.C. section 360bbb-3(b)(1), unless the authorization is terminated or revoked.  Performed at [facility], [place] [address] [place], [place] [address]      Signed: Marlowe Aschoff Wille Aubuchon  [facility] 06/20/2020, 10:49 AM

## 2020-06-20 NOTE — Procedures (Signed)
Interventional Radiology Procedure:   Indications: Metastatic pancreatic cancer  Procedure: Port placement  Findings: Right jugular port, tip at SVC/RA junction  Complications: None     EBL: Minimal, less than 10 ml  Plan: Keep port site and incisions dry for at least 24 hours.     Rodarius Kichline R. Anselm Pancoast, MD  Pager: [phone]

## 2020-06-20 NOTE — Progress Notes (Signed)
Pt elevated temp of 100 F. MD notified.

## 2020-06-20 NOTE — TOC Transition Note (Addendum)
Transition of Care [facility]) - CM/SW Discharge Note   Patient Details  Name: Kirsten [name] MRN: [id number] Date of Birth: 1961-01-28  [facility] CM/SW Contact:  Sharin Mons, RN Phone Number: 06/20/2020, 11:44 AM   Clinical Narrative:    Patient will DC to: home Anticipated DC date: 06/20/2020 Family notified: yes Transport by: car  Presented with abdominal discomfort. Hospital course found to have a pancreas mass, liver and lung lesions.  Per MD patient ready for DC today. RN, patient, and  patient's daughter notified of DC. Order noted for HHPT. Pt agreeable to home health services and without preference. Referral made with [facility] ...acceptance pending.  Hinton Dyer (Daughter) Sheran Newstrom  ( son)    [phone]     DME : rolling walker and 3in 1/bsc, referral made with [facility] and will be delivered to bedside prior to d/c.  Pt without Rx med concerns.  Post hospital f/u noted on AVS.  Daughter to provide transportation to home.   06/20/2020 @1300  [facility] unable to accept 2/2 out of network.  [facility] unable to accept for [facility] services.   [facility] unable to accept for [facility] services.   [facility] unable to accept, out of network.   [facility] referral made for home health services , acceptance pending.  06/20/2020 @1355  Call received from [facility] they have accepted pt for home health PT services.  RNCM will sign off for now as intervention is no longer needed. Please consult [place] again if new needs arise.    Final next level of care: Home w Home Health Services Barriers to Discharge: No Barriers Identified   Patient Goals and CMS Choice     Choice offered to / list presented to : Patient  Discharge Placement                       Discharge Plan and Services                DME Arranged: 3-N-1,Walker rolling DME Agency: [facility] Date DME Agency Contacted:  06/20/20 Time DME Agency Contacted: 1142 Representative spoke with at DME Agency: Freda Munro HH Arranged: PT [place]: Other - See comment ([place]) Date [place]: 06/20/20 Time [place]: [phone] Representative spoke with at [place]: Advertising account planner ( acceptance pending)  Social Determinants of Health ([place]) Interventions     Readmission Risk Interventions No flowsheet data found.

## 2020-06-20 NOTE — Progress Notes (Signed)
Per Dr. Benay Spice request: Sent email to [facility] requesting Foundation One testing on case #[id number] dated 06/18/20. Dx: C25.9 Stage IV

## 2020-06-20 NOTE — Progress Notes (Signed)
IP PROGRESS NOTE  Subjective:   Kirsten [name] continues to have pain in the right upper abdomen.  She was started on Duragesic patch yesterday.  Her pain is adequately controlled with the current narcotic regimen.  She had a bowel movement yesterday.  Objective: Vital signs in last 24 hours: Blood pressure 136/73, pulse 93, temperature 98.3 F (36.8 C), temperature source Oral, resp. rate 17, height 5\' 3"  (1.6 m), SpO2 99 %.  Intake/Output from previous day: 04/20 0701 - 04/21 0700 In: 479.7 [P.O.:220; I.V.:259.7] Out: 58 [Urine:50]  Physical Exam:   Abdomen: Right upper abdomen biopsy site with a Band-Aid in place.   Portacath/PICC-without erythema  Lab Results: Recent Labs    06/18/20 0259 06/20/20 0149  WBC 6.3 6.9  HGB 11.5* 10.8*  HCT 36.0 33.4*  PLT 306 301    BMET Recent Labs    06/18/20 0259  NA 135  K 3.6  CL 103  CO2 25  GLUCOSE 92  BUN 7*  CREATININE 0.97  CALCIUM 8.9     Medications: I have reviewed the patient's current medications.  Assessment/Plan: 1.  Metastatic pancreas cancer  Right upper quadrant ultrasound 06/17/2020-multiple hypoechoic liver lesions  CT abdomen/pelvis 06/17/2020- pancreas tail mass, multiple liver metastases, enlarged periaortic nodes, right ovary metastasis?,  Nodularity at the left paracolic gutter and posterior/inferior stomach suggesting peritoneal tumor spread  CT chest 06/17/2020- scattered small pulmonary nodules consistent with metastatic disease  Ultrasound-guided biopsy of a left liver mass 06/18/2020- adenocarcinoma, morphology compatible with metastatic pancreatic adenocarcinoma 2.  Pain secondary to #1 3.  Weight loss 4.  Family history of pancreas cancer  Kirsten [name] appears stable.  She has been diagnosed with metastatic pancreas cancer.  I discussed the diagnosis and treatment options with Kirsten [name] and her daughter.  She understands no therapy will be curative.  We discussed supportive/comfort care  versus a trial of systemic chemotherapy.  She would like to proceed with chemotherapy.  She will undergo Port-A-Cath placement today.  Outpatient follow-up is scheduled at the Cancer center on 06/26/2020.  We will submit the liver biopsy tissue for foundation 1 testing.  She will receive chemotherapy teaching on 06/26/2020.  The plan is to initiate systemic chemotherapy during the week of 5-22.  She appears stable for discharge from an oncology standpoint.  She should continue a bowel regimen and narcotic analgesics.   LOS: 3 days   [name], MD   06/20/2020, 1:13 PM

## 2020-06-20 NOTE — Evaluation (Signed)
Physical Therapy Evaluation Patient Details Name: Kirsten [name] MRN: [id number] DOB: 1961/01/23 Today's Date: 06/20/2020   History of Present Illness  Pt is a 60 y.o. female who presented 4/18 with R-sided abdomen pain that had been present for 2 weeks. 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 PE negative. S/p  ultrasound guided liver mass biopsy 4/19. No significant PMH.    Clinical Impression  Pt presents with condition above and deficits mentioned below, see PT Problem List. PTA, she was independent with all functional mobility and working as a Secretary/administrator. At baseline, pt drives and lives with her son in a 1-level home with 6 STE and bil hand rails. Currently, pt is primarily limited in mobility by her R abdomen pain, causing her to flex excessively at her trunk with standing and gait and reach for objects for support. This places her at slight risk for falls and pt demonstrated improved balance when provided bil UE support of a RW. However, she continued to only tolerate ambulating short distances at a very slow pace due to her pain. Pt is also displaying some generalized lower extremity weakness, likely from decreased activity recently. Thus, pt would benefit from [facility] PT follow-up at d/c to maximize her safety and independence with all functional mobility. Pt is very concerned about being a burden for her children, but her daughter plans to move back in with the pt to care for her. Due to pt's diagnosis and plan for chemotherapy she would benefit from being surrounded by familiar and good social support and limit possible exposure to diseases. Will continue to follow acutely.     Follow Up Recommendations Home health PT;Supervision for mobility/OOB    Equipment Recommendations  Rolling walker with 5" wheels;3in1 (PT)    Recommendations for Other Services OT consult     Precautions /  Restrictions Precautions Precautions: Fall Restrictions Weight Bearing Restrictions: No      Mobility  Bed Mobility Overal bed mobility: Needs Assistance Bed Mobility: Sit to Supine;Rolling;Sidelying to Sit Rolling: Independent Sidelying to sit: Independent   Sit to supine: Min assist   General bed mobility comments: Pt able to come to sit EOB with extra time and effort, but safely independently. Pt needing minA to lift legs back onto bed to return to supine due to abdomen pain    Transfers Overall transfer level: Needs assistance Equipment used: None Transfers: Sit to/from Stand Sit to Stand: Min guard         General transfer comment: Pt able to come to stand safely without LOB, but slowly, min guard for safety.  Ambulation/Gait Ambulation/Gait assistance: Min guard Gait Distance (Feet): 40 Feet Assistive device: None;Rolling walker (2 wheeled) Gait Pattern/deviations: Step-through pattern;Decreased stride length;Trunk flexed Gait velocity: reduced Gait velocity interpretation: <1.31 ft/sec, indicative of household ambulator General Gait Details: Pt with very slow gait, reaching for objects, like bed rail, with 1 hand to support body due to excessive trunk flexion 2/2 pain. Thus, provided pt with RW, with improved posture and balance, but still decreased speed. Min guard for safety. Pt limited by pain primarily.  Stairs            Wheelchair Mobility    Modified Rankin (Stroke Patients Only)       Balance Overall balance assessment: Needs assistance Sitting-balance support: No upper extremity supported;Feet supported Sitting balance-Leahy Scale: Normal Sitting balance - Comments: Pt able to reach off BOS to donn/doff socks  with no LOB, extra time due to pain.   Standing balance support: No upper extremity supported;Single extremity supported;Bilateral upper extremity supported;During functional activity Standing balance-Leahy Scale: Good Standing balance  comment: Pt with descent balance, but her pain limits her mobility and places her at risk for falls and thus benefits from UE support.                             Pertinent Vitals/Pain Pain Assessment: 0-10 Pain Score: 5  Pain Location: R abdomen Pain Descriptors / Indicators: Discomfort;Grimacing;Guarding ("pushing up into ribs") Pain Intervention(s): Limited activity within patient's tolerance;Monitored during session;Repositioned    Home Living Family/patient expects to be discharged to:: Private residence Living Arrangements: Children (son) Available Help at Discharge: Family;Available 24 hours/day Type of Home: House Home Access: Stairs to enter Entrance Stairs-Rails: Right;Left;Can reach both Entrance Stairs-Number of Steps: 6 Home Layout: One level Home Equipment: Hand held shower head      Prior Function Level of Independence: Independent         Comments: 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 Pt independent with all ADLs and functional mobility.     Hand Dominance   Dominant Hand: Right    Extremity/Trunk Assessment   Upper Extremity Assessment Upper Extremity Assessment: Overall WFL for tasks assessed (MMT scores of grossly 4 to 4+, denies numbness/tingling)    Lower Extremity Assessment Lower Extremity Assessment: Generalized weakness (Weakness of 4- MMT in bil hip flexors, 4 MMT in bil ankle dorsiflexors, 4+ MMT in bil  hamstrings and 5 MMT in bil quads; denies numbness/tingling)    Cervical / Trunk Assessment Cervical / Trunk Assessment: Normal  Communication   Communication: No difficulties  Cognition Arousal/Alertness: Awake/alert Behavior During Therapy: WFL for tasks assessed/performed;Flat affect Overall Cognitive Status: Within Functional Limits for tasks assessed                                 General Comments: A&Ox4. Slightly flat affect, but considering circumstances this may be appropriate.      General  Comments General comments (skin integrity, edema, etc.): Daughter and son present during session; SpO2 >/= 92% throughout, HR 90-100s; pt very concerned about the burden of her going home with her children may cause them    Exercises     Assessment/Plan    PT Assessment Patient needs continued PT services  PT Problem List Decreased strength;Decreased activity tolerance;Decreased balance;Decreased mobility;Decreased knowledge of use of DME;Pain       PT Treatment Interventions DME instruction;Gait training;Stair training;Functional mobility training;Therapeutic activities;Therapeutic exercise;Balance training;Neuromuscular re-education;Patient/family education    PT Goals (Current goals can be found in the Care Plan section)  Acute Rehab PT Goals Patient Stated Goal: to not be a burden on her children PT Goal Formulation: With patient/family Time For Goal Achievement: 07/04/20 Potential to Achieve Goals: Fair    Frequency Min 3X/week   Barriers to discharge        Co-evaluation               AM-PAC PT "6 Clicks" Mobility  Outcome Measure Help needed turning from your back to your side while in a flat bed without using bedrails?: None Help needed moving from lying on your back to sitting on the side of a flat bed without using bedrails?: None Help needed moving to and from a bed to a chair (including a wheelchair)?:  A Little Help needed standing up from a chair using your arms (e.g., wheelchair or bedside chair)?: A Little Help needed to walk in hospital room?: A Little Help needed climbing 3-5 steps with a railing? : A Lot 6 Click Score: 19    End of Session Equipment Utilized During Treatment: Gait belt Activity Tolerance: Patient limited by pain Patient left: in bed;with family/visitor present (with transporter present to take pt for procedure) Nurse Communication: Mobility status PT Visit Diagnosis: Unsteadiness on feet (R26.81);Other abnormalities of gait and  mobility (R26.89);Muscle weakness (generalized) (M62.81);Difficulty in walking, not elsewhere classified (R26.2);Pain Pain - Right/Left: Right Pain - part of body:  (abdomen)    Time: 6812-7517 PT Time Calculation (min) (ACUTE ONLY): 41 min   Charges:   PT Evaluation $PT Eval Moderate Complexity: 1 Mod PT Treatments $Gait Training: 8-22 mins $Therapeutic Activity: 8-22 mins        Moishe Spice, PT, DPT Acute Rehabilitation Services  Pager: [phone] Office: [phone]   Orvan Falconer 06/20/2020, 9:41 AM

## 2020-06-20 NOTE — Progress Notes (Signed)
Patient back from IR; right chest port placed. Monitoring vitals q30 mins x 4 per order. Monitoring post procedure status prior to discharge.

## 2020-06-21 NOTE — Evaluation (Signed)
Occupational Therapy Evaluation Patient Details Name: Kirsten [name] MRN: [id number] DOB: [birth year] Today's Date: 06/21/2020    History of Present Illness Pt is a 60 y.o. female who presented 4/18 with R-sided abdomen pain that had been present for 2 weeks. 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 PE negative. S/p  ultrasound guided liver mass biopsy 4/19. No significant PMH.   Clinical Impression   Pt admitted to the ED for concerns listed above. PTA pt reported being independent in all ADL's and IADL's. Pt works as a Secretary/administrator at [employer], drives, and maintains a very independent lifestyle. At this time, pt demonstrated continued independence, slowed pace due to pain level/guarding against potential pain. Pt mobilizes better with RW, however is able to manage without RW as well. Pt does not need OT services at this time and will be discharged from acute OT.    Follow Up Recommendations  No OT follow up    Equipment Recommendations  3 in 1 bedside commode;Other (comment) (walker)    Recommendations for Other Services       Precautions / Restrictions Precautions Precautions: Fall Restrictions Weight Bearing Restrictions: No      Mobility Bed Mobility Overal bed mobility: Modified Independent Bed Mobility: Supine to Sit;Sit to Sidelying;Rolling Rolling: Independent Sidelying to sit: Modified independent (Device/Increase time) Supine to sit: Modified independent (Device/Increase time) Sit to supine: Modified independent (Device/Increase time) Sit to sidelying: Modified independent (Device/Increase time) General bed mobility comments: Pt able to complete all bed mobility from a flat bed, using rails intermittently to help with pushing up to sit and controlling decent to lay down.    Transfers Overall transfer level: Modified independent Equipment used: None Transfers: Sit  to/from Stand Sit to Stand: Modified independent (Device/Increase time)         General transfer comment: Pt able to come to stand safely without LOB, but slowly    Balance Overall balance assessment: No apparent balance deficits (not formally assessed) Sitting-balance support: No upper extremity supported;Feet supported Sitting balance-Leahy Scale: Normal     Standing balance support: No upper extremity supported;Bilateral upper extremity supported;Single extremity supported;During functional activity Standing balance-Leahy Scale: Good Standing balance comment: Pt able to maintain balance with no support, however moves better with both UE support on RW to lessen pain. Intermittent single arm support for functional activities at the sink                           ADL either performed or assessed with clinical judgement   ADL Overall ADL's : Modified independent;At baseline                                       General ADL Comments: Pt moves slowly due to pain, but is able to complete all grooming and bathing in standing as long as she can support herself with 1 hand intermittently. Pt donned and doffed socks and hospital gown with no difficulty. Functional mobility completed with and without RW. Without RW pt moved very slow and cautiously, with RW pt was able to move at her normal pace, less hindered by pain.     Vision Baseline Vision/History: Wears glasses Wears Glasses: Reading only Patient Visual Report: No change from baseline Vision Assessment?: No apparent visual deficits     Perception  Perception Perception Tested?: No   Praxis Praxis Praxis tested?: Not tested    Pertinent Vitals/Pain Pain Assessment: 0-10 Pain Score: 4  Pain Location: R abdomen Pain Descriptors / Indicators: Discomfort;Grimacing;Guarding Pain Intervention(s): Monitored during session;Repositioned;Patient requesting pain meds-RN notified     Hand Dominance Right    Extremity/Trunk Assessment Upper Extremity Assessment Upper Extremity Assessment: Overall WFL for tasks assessed   Lower Extremity Assessment Lower Extremity Assessment: Defer to PT evaluation   Cervical / Trunk Assessment Cervical / Trunk Assessment: Normal   Communication Communication Communication: No difficulties   Cognition Arousal/Alertness: Awake/alert Behavior During Therapy: WFL for tasks assessed/performed;Flat affect Overall Cognitive Status: Within Functional Limits for tasks assessed                                 General Comments: A&Ox4. Slightly flat affect, but considering circumstances this may be appropriate.   General Comments  VSS on RA    Exercises     Shoulder Instructions      Home Living Family/patient expects to be discharged to:: Private residence Living Arrangements: Children Available Help at Discharge: Family;Available 24 hours/day Type of Home: House Home Access: Stairs to enter [employer] of Steps: 6 Entrance Stairs-Rails: Right;Left;Can reach both Home Layout: One level     Bathroom Shower/Tub: Teacher, early years/pre: Standard Bathroom Accessibility: Yes How Accessible: Accessible via walker Home Equipment: Hand held shower head          Prior Functioning/Environment Level of Independence: Independent        Comments: 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 Pt independent with all ADLs and functional mobility.        OT Problem List: Decreased strength;Decreased activity tolerance      OT Treatment/Interventions:      OT Goals(Current goals can be found in the care plan section) Acute Rehab OT Goals Patient Stated Goal: to go home OT Goal Formulation: All assessment and education complete, DC therapy  OT Frequency:     Barriers to D/C:            Co-evaluation              AM-PAC OT "6 Clicks" Daily Activity     Outcome Measure Help from another person eating meals?:  None Help from another person taking care of personal grooming?: None Help from another person toileting, which includes using toliet, bedpan, or urinal?: None Help from another person bathing (including washing, rinsing, drying)?: None Help from another person to put on and taking off regular upper body clothing?: None Help from another person to put on and taking off regular lower body clothing?: None 6 Click Score: 24   End of Session Equipment Utilized During Treatment: Gait belt;Rolling walker Nurse Communication: Mobility status;Patient requests pain meds  Activity Tolerance: Patient tolerated treatment well Patient left: in bed;with call bell/phone within reach  OT Visit Diagnosis: Muscle weakness (generalized) (M62.81)                Time: 6629-4765 OT Time Calculation (min): 34 min Charges:  OT General Charges $OT Visit: 1 Visit OT Evaluation $OT Eval Low Complexity: 1 Low OT Treatments $Self Care/Home Management : 8-22 mins  Dariush Mcnellis H., OTR/L Acute Rehabilitation  Christopher Glasscock Elane Saksham Akkerman 06/21/2020, 9:26 AM

## 2020-06-21 NOTE — Progress Notes (Signed)
PROGRESS NOTE  Kirsten Karr  DOB: 12/17/1960  PCP: Kathyrn Lass BJY:[id number]  DOA: 06/17/2020  LOS: 4 days   Chief Complaint  Patient presents with  . Abdominal Pain   Brief narrative: Kirsten [name] is a 60 y.o. female with no significant PMH admitted with right sided abd pain and weight loss. 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  See below for details.  Subjective: Patient was seen and examined this morning.   Lying down in bed.  Not in distress.  Not in pain.  She reports that for the last few days, she had 1-2 episodes of blood coming out of her vagina on the removal of the pubic catheter.  Urine in the canister seems concentrated but without blood.  Assessment/Plan: Pancreatic mass with evidence of metastatic disease IR did liver biopsy on 4/19 and Port-A-Cath placement done 4/21 Seen by oncology.  Plan for continuation of oncology care as an outpatient  Abdominal pain -continues to have abdominal pain which limits her ability to move. -Currently on fentanyl patch and oral oxycodone as needed for pain. To follow-up with oncology for further pain management.  Constipation -On daily MiraLAX and as needed Dulcolax.  To continue the same at home while on opioids.  Pervaginal bleeding -Unclear source.  May be local injury due to the collecting/suction tip of the purewick catheter. 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 I do not think this finding could lead to pervaginal bleeding. -We will remove pure wick catheter.  Not on pharmacologic DVT prophylaxis. Continue to monitor clinically for next 24 hours.  Mobility: Encourage ambulation Code Status:   Code Status: Full Code  Nutritional status: There is no height or weight on file to calculate BMI.     Diet Order            Diet regular Room service  appropriate? Yes; Fluid consistency: Thin  Diet effective now           Diet general                 DVT prophylaxis: SCDs Start: 06/17/20 1537   Antimicrobials:  None Fluid: Not on IV fluid Consultants: IR, oncology Family Communication:  Family not at bedside today  Status is: Inpatient  Remains inpatient appropriate because: Needs 24-hour more inpatient monitoring because of vaginal bleeding  Dispo: The patient is from: Home              Anticipated d/c is to: Home              Patient currently is not medically stable to d/c.   Difficult to place patient No       Infusions:  . sodium chloride 250 mL (06/17/20 1735)  . sodium chloride 75 mL/hr at 06/20/20 2214    Scheduled Meds: . fentaNYL  1 patch Transdermal Q72H  . polyethylene glycol  17 g Oral Daily  . senna  2 tablet Oral QHS  . sodium chloride flush  3 mL Intravenous Q12H    Antimicrobials: Anti-infectives (From admission, onward)   None      PRN meds: sodium chloride, bisacodyl, HYDROcodone-acetaminophen, magnesium citrate, ondansetron **OR** ondansetron (ZOFRAN) IV, sodium chloride flush   Objective: Vitals:   06/20/20 2011 06/21/20 0422  BP: (!) 146/86 (!) 153/75  Pulse: 81 86  Resp: 16 16  Temp: 98.6 F (37 C)  98.3 F (36.8 C)  SpO2: 95% 91%    Intake/Output Summary (Last 24 hours) at 06/21/2020 1054 Last data filed at 06/21/2020 0700 Gross per 24 hour  Intake 680 ml  Output 500 ml  Net 180 ml   There were no vitals filed for this visit. Weight change:  There is no height or weight on file to calculate BMI.   Physical Exam: General exam: Present middle-aged African-American female, not in physical distress.  No blood in the canister. Skin: No rashes, lesions or ulcers. HEENT: Atraumatic, normocephalic, no obvious bleeding Lungs: Clear to auscultation bilaterally CVS: Regular rate and rhythm, no murmur GI/Abd soft, nontender, nondistended, bowel sound present CNS: Alert,  awake, oriented x3. Psychiatry: Depressed look Extremities: No pedal edema, no calf tenderness  Data Review: I have personally reviewed the laboratory data and studies available.  Recent Labs  Lab 06/17/20 0830 06/18/20 0259 06/20/20 0149  WBC 6.9 6.3 6.9  NEUTROABS 3.7  --  3.9  HGB 14.0 11.5* 10.8*  HCT 43.3 36.0 33.4*  MCV 89.8 88.5 87.7  PLT 386 306 301   Recent Labs  Lab 06/17/20 0830 06/18/20 0259  NA 137 135  K 4.0 3.6  CL 102 103  CO2 26 25  GLUCOSE 104* 92  BUN 9 7*  CREATININE 1.00 0.97  CALCIUM 9.6 8.9    F/u labs ordered Unresulted Labs (From admission, onward)         None      Signed, Terrilee Croak, MD [facility] 06/21/2020

## 2020-06-21 NOTE — Progress Notes (Signed)
Blood on purewick, MD notified. Purewick d/c per MD request. Daughter at bedside.

## 2020-06-21 NOTE — Progress Notes (Signed)
Physical Therapy Treatment Patient Details Name: Kirsten [name] MRN: [id number] DOB: 1960/06/22 Today's Date: 06/21/2020    History of Present Illness Pt is a 60 y.o. female who presented 4/18 with R-sided abdomen pain that had been present for 2 weeks. 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 PE negative. S/p  ultrasound guided liver mass biopsy 4/19. No significant PMH.    PT Comments    Pt supine in bed on arrival. Pt continues to have significant decrease in gait speed due to increased pain. She did achieve 6 stairs with a modified gait pattern. Nurse notified that patient is requesting pain meds. Due to increased pain pt requested returning to bed instead of sitting up in chair. She would benefit from further PT to increase strength, endurance and functional independence. HHPT upon d/c continues to remain appropriate.    Follow Up Recommendations  Home health PT;Supervision for mobility/OOB     Equipment Recommendations  Rolling walker with 5" wheels;3in1 (PT)    Recommendations for Other Services       Precautions / Restrictions Precautions Precautions: Fall Restrictions Weight Bearing Restrictions: No    Mobility  Bed Mobility Overal bed mobility: Needs Assistance Bed Mobility: Rolling;Sidelying to Sit;Supine to Sit Rolling: Supervision   Supine to sit: Supervision   Sit to sidelying: Min assist General bed mobility comments: Pt initiated sit at EOB with HOB elevated to get up. educated pt on benefit of log rolling.  Used Log rolling to get back to bed. Required supervision for saftey and educating on hand placement with rolling and min assist to get legs back in bed.    Transfers Overall transfer level: Needs assistance Equipment used: Rolling walker (2 wheeled) Transfers: Sit to/from Stand Sit to Stand: Min guard         General transfer comment: min guard  for safety and cues for hand placement.  Ambulation/Gait Ambulation/Gait assistance: Min guard Gait Distance (Feet): 50 Feet Assistive device: Rolling walker (2 wheeled) Gait Pattern/deviations: Step-through pattern;Decreased stride length;Trunk flexed     General Gait Details: Pt is very slow with gait with RW due to increased pain. Flexed trunk and cues for erect posture.   Stairs Stairs: Yes Stairs assistance: Min guard Stair Management: One rail Left Number of Stairs: 6 General stair comments: Ascended/descended stair with rail on left. Pt able to ascend going forward, but had increase in pain with forward descend so trained stepping down backwards.   Wheelchair Mobility    Modified Rankin (Stroke Patients Only)       Balance Overall balance assessment: Mild deficits observed, not formally tested Sitting-balance support: No upper extremity supported;Feet supported Sitting balance-Leahy Scale: Normal     Standing balance support: Bilateral upper extremity supported Standing balance-Leahy Scale: Good                              Cognition Arousal/Alertness: Awake/alert Behavior During Therapy: WFL for tasks assessed/performed;Flat affect Overall Cognitive Status: Within Functional Limits for tasks assessed                                        Exercises      General Comments        Pertinent Vitals/Pain Pain Assessment: 0-10 Pain Score: 5  Pain Location: R Abdomen;  Pain increased to 7/10 during activity Pain Descriptors / Indicators: Cramping;Grimacing Pain Intervention(s): Limited activity within patient's tolerance;Monitored during session    Home Living                      Prior Function            PT Goals (current goals can now be found in the care plan section) Acute Rehab PT Goals Patient Stated Goal: To go home Potential to Achieve Goals: Good Progress towards PT goals: Progressing toward goals     Frequency    Min 3X/week      PT Plan Current plan remains appropriate    Co-evaluation              AM-PAC PT "6 Clicks" Mobility   Outcome Measure  Help needed turning from your back to your side while in a flat bed without using bedrails?: A Little Help needed moving from lying on your back to sitting on the side of a flat bed without using bedrails?: A Little Help needed moving to and from a bed to a chair (including a wheelchair)?: A Little Help needed standing up from a chair using your arms (e.g., wheelchair or bedside chair)?: A Little Help needed to walk in hospital room?: A Little Help needed climbing 3-5 steps with a railing? : A Little 6 Click Score: 18    End of Session   Activity Tolerance: Patient tolerated treatment well;Patient limited by pain Patient left: in bed;with call bell/phone within reach;with bed alarm set Nurse Communication: Mobility status;Patient requests pain meds PT Visit Diagnosis: Unsteadiness on feet (R26.81);Other abnormalities of gait and mobility (R26.89);Muscle weakness (generalized) (M62.81);Difficulty in walking, not elsewhere classified (R26.2);Pain Pain - Right/Left: Right Pain - part of body:  (abdomen)     Time: 1001-1037 PT Time Calculation (min) (ACUTE ONLY): 36 min  Charges:  $Gait Training: 8-22 mins $Therapeutic Activity: 8-22 mins                      Sandria Manly, SPTA    Sandria Manly 06/21/2020, 1:13 PM

## 2020-06-22 NOTE — TOC Transition Note (Signed)
Transition of Care [facility]) - CM/SW Discharge Note   Patient Details  Name: Kirsten [name] MRN: [id number] Date of Birth: 22-Oct-1960  [facility] CM/SW Contact:  Bartholomew Crews, RN Phone Number: [phone] 06/22/2020, 10:35 AM   Clinical Narrative:     Patient to transition home today. Noted previous CM arranged DME RW, 3N1 through [employer] and HH PT through [facility]. Notified liaison at [facility] of transition home today. No further TOC needs identified at this time.   Final next level of care: Home w Home Health Services Barriers to Discharge: No Barriers Identified   Patient Goals and CMS Choice     Choice offered to / list presented to : Patient  Discharge Placement                       Discharge Plan and Services                DME Arranged: 3-N-1,Walker rolling DME Agency: [facility] Date DME Agency Contacted: 06/20/20 Time DME Agency Contacted: [phone] Representative spoke with at DME Agency: Freda Munro HH Arranged: PT [place]: Other - See comment ([place]) Date [place]: 06/20/20 Time [place]: [phone] Representative spoke with at [place]: Advertising account planner ( acceptance pending)  Social Determinants of Health ([place]) Interventions     Readmission Risk Interventions No flowsheet data found.

## 2020-06-22 NOTE — Progress Notes (Signed)
Patient to be discharged today to daughter's house with home health. Patient would like to wait until 1400 so I can educate daughter on how to apply fentanyl patch (patch is due at 1500).

## 2020-06-22 NOTE — Discharge Summary (Signed)
Physician Discharge Summary  Kirsten [name] WZ:[id number] DOB: [birth year] DOA: 06/17/2020  PCP: Pcp, No  Admit date: 06/17/2020 Discharge date: 06/22/2020  Admitted From: Home Discharge disposition: Home with outpatient PT   Code Status: Full Code  Diet Recommendation: Regular diet  Discharge Diagnosis:   Principal Problem:   Abdominal malignancy [facility])   Chief Complaint  Patient presents with  . Abdominal Pain   Brief narrative: Kirsten [name] is a 60 y.o. female with no significant PMH admitted with right sided abd pain and weight loss. 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  See below for details.  Subjective: Patient was seen and examined this morning.   Sitting up at the edge of the bed.  Not in distress.  No episode of further vaginal bleeding.  Urine looks clear.  Feels ready to go home today.  Hospital course: Pancreatic mass with evidence of metastatic disease -IR did liver biopsy on 4/19 and Port-A-Cath placement done 4/21 -Plan for continuation of oncology care as an outpatient  Abdominal pain -continues to have abdominal pain which limits her ability to move. -Currently on fentanyl patch and oral oxycodone as needed for pain.  Prescription sent to pharmacy.  Prior authorization for fentanyl done. To follow-up with oncology for further pain management.  Constipation -On daily MiraLAX and as needed Dulcolax. To continue the same at home while on opioids.  Pervaginal bleeding -Reported on 4/22.  Unclear source.  May be local injury due to the collecting/suction tip of the purewick catheter. 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 I do not think this finding could lead to pervaginal bleeding. -Pubic catheter was removed.  In 24 hours since then.  No further bleeding.  Stable to discharge to home  today with outpatient PT.    Wound care: Wound / Incision (Open or Dehisced) 06/18/20 Puncture Abdomen Mid liver biopsy site (Active)  Date First Assessed/Time First Assessed: 06/18/20 1027   Wound Type: Puncture  Location: Abdomen  Location Orientation: Mid  Wound Description (Comments): liver biopsy site  Present on Admission: No    Assessments 06/18/2020 10:30 AM 06/21/2020  7:54 PM  Dressing Type Adhesive bandage Adhesive bandage  Dressing Changed New --  Dressing Status Clean;Dry;Intact Clean;Dry;Intact  Peri-wound Assessment Intact --  Margins Attached edges (approximated) --  Closure None --  Drainage Amount None None  Treatment Cleansed --     No Linked orders to display    Discharge Exam:   Vitals:   06/21/20 0422 06/21/20 1522 06/21/20 2157 06/22/20 0405  BP: (!) 153/75 (!) 145/74 (!) 151/69 (!) 167/91  Pulse: 86 93 94 96  Resp: 16 16 17 16   Temp: 98.3 F (36.8 C) 98.9 F (37.2 C) 98.8 F (37.1 C) 98.8 F (37.1 C)  TempSrc: Oral Oral Oral Oral  SpO2: 91% 92% 90% 92%  Height:        There is no height or weight on file to calculate BMI.  General exam: Pleasant middle-aged African-American female.  Not in distress. Skin: No rashes, lesions or ulcers. HEENT: Atraumatic, normocephalic, no obvious bleeding Lungs: Clear to auscultation bilaterally CVS: Regular rate and rhythm, no murmur GI/Abd soft, nontender, nondistended, bowel sound present CNS: Alert, awake, oriented x3 Psychiatry: Mood appropriate Extremities: No pedal edema, no calf tenderness  Follow ups:   Discharge Instructions    Diet general   Complete by:  As directed    Increase activity slowly   Complete by: As directed    No wound care   Complete by: As directed       Follow-up Information    [facility] Follow up.   Specialty: [place] Why: [facility] will provide home health PT services, start of care will be within 48 hours post discharge Contact  information: [address] [place] [address] [phone]               Recommendations for Outpatient Follow-Up:   1. Follow-up with PCP as an outpatient 2. Follow-up with oncology as an outpatient  Discharge Instructions:  Follow with Primary MD Pcp, No in 7 days   Get CBC/BMP checked in next visit within 1 week by PCP or SNF MD ( we routinely change or add medications that can affect your baseline labs and fluid status, therefore we recommend that you get the mentioned basic workup next visit with your PCP, your PCP may decide not to get them or add new tests based on their clinical decision)  On your next visit with your PCP, please Get Medicines reviewed and adjusted.  Please request your PCP  to go over all Hospital Tests and Procedure/Radiological results at the follow up, please get all Hospital records sent to your Prim MD by signing hospital release before you go home.  Activity: As tolerated with Full fall precautions use walker/cane & assistance as needed  For Heart failure patients - Check your Weight same time everyday, if you gain over 2 pounds, or you develop in leg swelling, experience more shortness of breath or chest pain, call your Primary MD immediately. Follow Cardiac Low Salt Diet and 1.5 lit/day fluid restriction.  If you have smoked or chewed Tobacco in the last 2 yrs please stop smoking, stop any regular Alcohol  and or any Recreational drug use.  If you experience worsening of your admission symptoms, develop shortness of breath, life threatening emergency, suicidal or homicidal thoughts you must seek medical attention immediately by calling 911 or calling your MD immediately  if symptoms less severe.  You Must read complete instructions/literature along with all the possible adverse reactions/side effects for all the Medicines you take and that have been prescribed to you. Take any new Medicines after you have completely understood and  accpet all the possible adverse reactions/side effects.   Do not drive, operate heavy machinery, perform activities at heights, swimming or participation in water activities or provide baby sitting services if your were admitted for syncope or siezures until you have seen by Primary MD or a Neurologist and advised to do so again.  Do not drive when taking Pain medications.  Do not take more than prescribed Pain, Sleep and Anxiety Medications  Wear Seat belts while driving.   Please note You were cared for by a hospitalist during your hospital stay. If you have any questions about your discharge medications or the care you received while you were in the hospital after you are discharged, you can call the unit and asked to speak with the hospitalist on call if the hospitalist that took care of you is not available. Once you are discharged, your primary care physician will handle any further medical issues. Please note that NO REFILLS for any discharge medications will be authorized once you are discharged, as it is imperative that you return to your primary care physician (or establish a relationship with a primary  care physician if you do not have one) for your aftercare needs so that they can reassess your need for medications and monitor your lab values.    Allergies as of 06/22/2020   No Known Allergies     Medication List    TAKE these medications   BC HEADACHE POWDER PO Take 1 packet by mouth daily as needed (PAIN).   bisacodyl 5 MG EC tablet Commonly known as: DULCOLAX Take 1 tablet (5 mg total) by mouth daily as needed for moderate constipation.   fentaNYL 12 MCG/HR Commonly known as: [place] 1 patch onto the skin every 3 (three) days for 15 days.   HYDROcodone-acetaminophen 10-325 MG tablet Commonly known as: NORCO Take 1 tablet by mouth every 6 (six) hours as needed for up to 5 days for moderate pain.   polyethylene glycol 17 g packet Commonly known as: MIRALAX /  GLYCOLAX Take 17 g by mouth daily.   senna 8.6 MG Tabs tablet Commonly known as: SENOKOT Take 2 tablets (17.2 mg total) by mouth at bedtime.            Durable Medical Equipment  (From admission, onward)         Start     Ordered   06/20/20 1156  For home use only DME Walker rolling  Once       Question Answer Comment  Walker: With 5 Inch Wheels   Patient needs a walker to treat with the following condition Weakness      06/20/20 1155   06/20/20 1156  For home use only DME 3 n 1  Once        06/20/20 1155          Time coordinating discharge: 35 minutes  The results of significant diagnostics from this hospitalization (including imaging, microbiology, ancillary and laboratory) are listed below for reference.    Procedures and Diagnostic Studies:   CT Angio Chest PE W/Cm &/Or Wo Cm  Result Date: 06/17/2020 CLINICAL DATA:  Metastatic pancreatic cancer.  Shortness of breath. EXAM: CT ANGIOGRAPHY CHEST WITH CONTRAST TECHNIQUE: Multidetector CT imaging of the chest was performed using the standard protocol during bolus administration of intravenous contrast. Multiplanar CT image reconstructions and MIPs were obtained to evaluate the vascular anatomy. CONTRAST:  28mL OMNIPAQUE IOHEXOL 350 MG/ML SOLN COMPARISON:  None. FINDINGS: Cardiovascular: The heart is borderline enlarged. No pericardial effusion. The aorta is normal in caliber. No dissection. Minimal scattered atherosclerotic calcifications. The pulmonary arterial tree is fairly well opacified. No filling defects to suggest pulmonary embolism. Mediastinum/Nodes: No enlarged mediastinal hilar lymph nodes to suggest metastatic disease. The esophagus is grossly normal. Small epicardial node is noted on the right side on image 93/5. Lungs/Pleura: Scattered small pulmonary nodules consistent with pulmonary metastatic disease. The largest pulmonary lesion is at the left lung base and measures 15.5 mm. No pleural effusions or pulmonary  edema. No pneumothorax. Upper Abdomen: Diffuse hepatic metastatic disease. Large centrally necrotic pancreatic tail mass. Musculoskeletal: No significant bony findings. No findings suspicious for osseous metastatic disease. Review of the MIP images confirms the above findings. IMPRESSION: 1. No CT findings for pulmonary embolism. 2. Normal caliber thoracic aorta. 3. Scattered small pulmonary nodules consistent with pulmonary metastatic disease. 4. Diffuse hepatic metastatic disease. 5. Large centrally necrotic pancreatic tail mass. 6. Aortic atherosclerosis. Aortic Atherosclerosis (ICD10-I70.0). Electronically Signed   By: Marijo Sanes M.D.   On: 06/17/2020 14:08   CT Abdomen Pelvis W Contrast  Result Date: 06/17/2020 CLINICAL DATA:  Abdominal pain, right-sided for 2 weeks. EXAM: CT ABDOMEN AND PELVIS WITH CONTRAST TECHNIQUE: Multidetector CT imaging of the abdomen and pelvis was performed using the standard protocol following bolus administration of intravenous contrast. CONTRAST:  155mL OMNIPAQUE IOHEXOL 300 MG/ML  SOLN COMPARISON:  None. FINDINGS: Lower chest: Pulmonary nodule at the left lung base measuring 16 mm. Hepatobiliary: Multiple hypodense liver masses throughout both lobes, measured at up to 4 cm in the posterior segment right lobe. The enhancement characteristics match the presumed primary pancreatic mass.No evidence of biliary obstruction or stone. Pancreas: Hypointense mass arising from the tail of the pancreas with growth into the peripancreatic fat. The mass measures up to 4.5 cm in diameter. Spleen: Probable direct invasion of the primary pancreas mass into the hilum. The spleen is heterogeneously enhancing in the setting of chronic splenic vascular compromise. Adrenals/Urinary Tract: Negative adrenals. Marked left renal scarring more heavily affecting the lower pole. Smaller area of right lower pole renal scarring. Unremarkable bladder. Stomach/Bowel:  No obstruction. No visible  inflammation. Vascular/Lymphatic: Nonenhancing splenic vessels attributed to the pancreas tumor. Enlarged periaortic lymph nodes. A low-density node right of the aorta measures 13 mm in diameter. Reproductive:Thickened appearance of the right adnexa with posterior low-density component in total measuring 3 cm. Suspect metastatic coating of the right ovary. Other: Small nodule at the low left pericolic gutter. Nodularity along the posterior and inferior stomach is likely also peritoneal. Musculoskeletal: Spinal degeneration with L4-5 anterolisthesis and stenosis. No bony metastasis is noted. 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 Direct invasion of the splenic hilum with chronic splenic vascular compromise. Electronically Signed   By: Monte Fantasia M.D.   On: 06/17/2020 10:05   US BIOPSY (LIVER)  Result Date: 06/18/2020 INDICATION: 60 year old female with history of abdominal malignancy and multiple hepatic masses. EXAM: ULTRASOUND GUIDED LIVER LESION BIOPSY COMPARISON:  06/17/2020 MEDICATIONS: None ANESTHESIA/SEDATION: Fentanyl 50 mcg IV; Versed 1 mg IV Total Moderate Sedation time:  14 minutes. The patient's level of consciousness and vital signs were monitored continuously by radiology nursing throughout the procedure under my direct supervision. COMPLICATIONS: None immediate. PROCEDURE: Informed written consent was obtained from the patient after a discussion of the risks, benefits and alternatives to treatment. The patient understands and consents the procedure. A timeout was performed prior to the initiation of the procedure. Ultrasound scanning was performed of the right upper abdominal quadrant demonstrates innumerable hypoechoic hepatic masses. A favorable mass for biopsy in the left lobe was targeted. A left lobe mass measuring approximately 1.5 cm was selected for biopsy and the procedure was planned. The  right upper abdominal quadrant was prepped and draped in the usual sterile fashion. The overlying soft tissues were anesthetized with 1% lidocaine with epinephrine. A 17 gauge, 6.8 cm co-axial needle was advanced into a peripheral aspect of the lesion. This was followed by 3 core biopsies with an 18 gauge core device under direct ultrasound guidance. The coaxial needle tract was embolized with a small amount of Gel-Foam slurry and superficial hemostasis was obtained with manual compression. Post procedural scanning was negative for definitive area of hemorrhage or additional complication. A dressing was placed. The patient tolerated the procedure well without immediate post procedural complication. IMPRESSION: Technically successful ultrasound guided core needle biopsy of left lobe hepatic mass. Ruthann Cancer, MD [facility]iology Electronically Signed   By: Ruthann Cancer MD   On: 06/18/2020 14:17   US Abdomen  Limited RUQ (LIVER/GB)  Result Date: 06/17/2020 CLINICAL DATA:  Right upper quadrant pain EXAM: ULTRASOUND ABDOMEN LIMITED RIGHT UPPER QUADRANT COMPARISON:  None. FINDINGS: Gallbladder: No gallstones or wall thickening visualized. No sonographic Murphy sign noted by sonographer. Common bile duct: Diameter: 4 mm, normal Liver: Multiple (at least 5) hypoechoic lesions are identified. Largest on the left measures 2.7 x 2.2 x 2.1 cm. Largest on the right measures 5.1 x 4.2 x 4.5 cm. Within normal limits in parenchymal echogenicity. Portal vein is patent on color Doppler imaging with normal direction of blood flow towards the liver. Other: None. IMPRESSION: Multiple hypoechoic liver lesions. Correlate with planned CT imaging. Unremarkable sonographic appearance of the gallbladder. Electronically Signed   By: Macy Mis M.D.   On: 06/17/2020 09:48     Labs:   Basic Metabolic Panel: Recent Labs  Lab 06/17/20 0830 06/18/20 0259  NA 137 135  K 4.0  3.6  CL 102 103  CO2 26 25  GLUCOSE 104* 92  BUN 9 7*  CREATININE 1.00 0.97  CALCIUM 9.6 8.9   GFR CrCl cannot be calculated (Unknown ideal weight.). Liver Function Tests: Recent Labs  Lab 06/17/20 0830 06/18/20 0259  AST 38 23  ALT 26 18  ALKPHOS 412* 311*  BILITOT 0.7 0.6  PROT 8.1 6.2*  ALBUMIN 3.3* 2.6*   Recent Labs  Lab 06/17/20 0830  LIPASE 43   No results for input(s): AMMONIA in the last 168 hours. Coagulation profile Recent Labs  Lab 06/18/20 0718  INR 1.1    CBC: Recent Labs  Lab 06/17/20 0830 06/18/20 0259 06/20/20 0149  WBC 6.9 6.3 6.9  NEUTROABS 3.7  --  3.9  HGB 14.0 11.5* 10.8*  HCT 43.3 36.0 33.4*  MCV 89.8 88.5 87.7  PLT 386 306 301   Cardiac Enzymes: No results for input(s): CKTOTAL, CKMB, CKMBINDEX, TROPONINI in the last 168 hours. BNP: Invalid input(s): POCBNP CBG: No results for input(s): GLUCAP in the last 168 hours. D-Dimer No results for input(s): DDIMER in the last 72 hours. Hgb A1c No results for input(s): HGBA1C in the last 72 hours. Lipid Profile No results for input(s): CHOL, HDL, LDLCALC, TRIG, CHOLHDL, LDLDIRECT in the last 72 hours. Thyroid function studies No results for input(s): TSH, T4TOTAL, T3FREE, THYROIDAB in the last 72 hours.  Invalid input(s): FREET3 Anemia work up No results for input(s): VITAMINB12, FOLATE, FERRITIN, TIBC, IRON, RETICCTPCT in the last 72 hours. Microbiology Recent Results (from the past 240 hour(s))  Resp Panel by RT-PCR (Flu A&B, Covid) Nasopharyngeal Swab     Status: None   Collection Time: 06/17/20  2:47 PM   Specimen: Nasopharyngeal Swab; Nasopharyngeal(NP) swabs in vial transport medium  Result Value Ref Range Status   SARS Coronavirus 2 by RT PCR NEGATIVE NEGATIVE Final    Comment: (NOTE) SARS-CoV-2 target nucleic acids are NOT DETECTED.  The SARS-CoV-2 RNA is generally detectable in upper respiratory specimens during the acute phase of infection. The lowest concentration  of SARS-CoV-2 viral copies this assay can detect is 138 copies/mL. A negative result does not preclude SARS-Cov-2 infection and should not be used as the sole basis for treatment or other patient management decisions. A negative result may occur with  improper specimen collection/handling, submission of specimen other than nasopharyngeal swab, presence of viral mutation(s) within the areas targeted by this assay, and inadequate number of viral copies(<138 copies/mL). A negative result must be combined with clinical observations, patient history, and epidemiological information. The expected result is Negative.  Fact Sheet for Patients:  EntrepreneurPulse.com.au  Fact Sheet for Healthcare Providers:  IncredibleEmployment.be  This test is no t yet approved or cleared by the [place] FDA and  has been authorized for detection and/or diagnosis of SARS-CoV-2 by FDA under an Emergency Use Authorization (EUA). This EUA will remain  in effect (meaning this test can be used) for the duration of the COVID-19 declaration under Section 564(b)(1) of the Act, 21 U.S.C.section 360bbb-3(b)(1), unless the authorization is terminated  or revoked sooner.       Influenza A by PCR NEGATIVE NEGATIVE Final   Influenza B by PCR NEGATIVE NEGATIVE Final    Comment: (NOTE) The Xpert Xpress SARS-CoV-2/FLU/RSV plus assay is intended as an aid in the diagnosis of influenza from Nasopharyngeal swab specimens and should not be used as a sole basis for treatment. Nasal washings and aspirates are unacceptable for Xpert Xpress SARS-CoV-2/FLU/RSV testing.  Fact Sheet for Patients: EntrepreneurPulse.com.au  Fact Sheet for Healthcare Providers: IncredibleEmployment.be  This test is not yet approved or cleared by the [place] FDA and has been authorized for detection and/or diagnosis of SARS-CoV-2 by FDA under an Emergency Use  Authorization (EUA). This EUA will remain in effect (meaning this test can be used) for the duration of the COVID-19 declaration under Section 564(b)(1) of the Act, 21 U.S.C. section 360bbb-3(b)(1), unless the authorization is terminated or revoked.  Performed at [facility], [place] [address] [place], [place] [address]      Signed: Terrilee Croak  [facility] 06/22/2020, 10:18 AM

## 2020-06-26 ENCOUNTER — Other Ambulatory Visit: Payer: Self-pay

## 2020-06-26 ENCOUNTER — Inpatient Hospital Stay: Payer: 59 | Attending: Nurse Practitioner | Admitting: Nurse Practitioner

## 2020-06-26 ENCOUNTER — Inpatient Hospital Stay: Payer: 59

## 2020-06-26 ENCOUNTER — Encounter: Payer: Self-pay | Admitting: Nurse Practitioner

## 2020-06-26 VITALS — BP 171/97 | HR 98 | Temp 97.8°F | Resp 20 | Ht 63.0 in | Wt 135.8 lb

## 2020-06-26 DIAGNOSIS — C7961 Secondary malignant neoplasm of right ovary: Secondary | ICD-10-CM | POA: Insufficient documentation

## 2020-06-26 DIAGNOSIS — C787 Secondary malignant neoplasm of liver and intrahepatic bile duct: Secondary | ICD-10-CM | POA: Diagnosis not present

## 2020-06-26 DIAGNOSIS — G893 Neoplasm related pain (acute) (chronic): Secondary | ICD-10-CM | POA: Diagnosis not present

## 2020-06-26 DIAGNOSIS — C252 Malignant neoplasm of tail of pancreas: Secondary | ICD-10-CM | POA: Diagnosis present

## 2020-06-26 DIAGNOSIS — C259 Malignant neoplasm of pancreas, unspecified: Secondary | ICD-10-CM

## 2020-06-26 DIAGNOSIS — R634 Abnormal weight loss: Secondary | ICD-10-CM | POA: Diagnosis not present

## 2020-06-26 DIAGNOSIS — Z7189 Other specified counseling: Secondary | ICD-10-CM

## 2020-06-26 DIAGNOSIS — Z800 Family history of malignant neoplasm of digestive organs: Secondary | ICD-10-CM

## 2020-06-26 MED ORDER — PROCHLORPERAZINE MALEATE 10 MG PO TABS: 10.0000 mg | ORAL_TABLET | Freq: Four times a day (QID) | ORAL | 2 refills | Status: DC | PRN

## 2020-06-26 MED ORDER — LIDOCAINE-PRILOCAINE 2.5-2.5 % EX CREA: TOPICAL_CREAM | CUTANEOUS | 2 refills | Status: DC

## 2020-06-26 MED ORDER — HYDROCODONE-ACETAMINOPHEN 10-325 MG PO TABS: 1.0000 | ORAL_TABLET | Freq: Four times a day (QID) | ORAL | 0 refills | Status: DC | PRN

## 2020-06-26 NOTE — Progress Notes (Addendum)
[facility] OFFICE PROGRESS NOTE   Diagnosis: Pancreas cancer  INTERVAL HISTORY:   Ms. Manninen returns for first outpatient oncology appointment since hospital discharge.  She continues to have right-sided abdominal pain.  She is on a 12 mcg Duragesic patch and takes Norco as needed, typically every 12 hours.  No nausea or vomiting.  Bowels are moving.  No fever or cough.  Objective:  Vital signs in last 24 hours:  Blood pressure (!) 171/97, pulse 98, temperature 97.8 F (36.6 C), temperature source Oral, resp. rate 20, height 5\' 3"  (1.6 m), weight 135 lb 12.8 oz (61.6 kg), SpO2 99 %.    HEENT: No thrush or ulcers. Resp: Lungs clear bilaterally. Cardio: Regular rate and rhythm. GI: Abdomen is soft.  Tender at the right upper abdomen.  Fullness, no definite hepatomegaly. Vascular: No leg edema. Neuro: Alert and oriented. Port-A-Cath is bandaged.  Lab Results:  Lab Results  Component Value Date   WBC 6.9 06/20/2020   HGB 10.8 (L) 06/20/2020   HCT 33.4 (L) 06/20/2020   MCV 87.7 06/20/2020   PLT 301 06/20/2020   NEUTROABS 3.9 06/20/2020    Imaging:  No results found.  Medications: I have reviewed the patient's current medications.  Assessment/Plan: 1.  Metastatic pancreas cancer  Right upper quadrant ultrasound 06/17/2020-multiple hypoechoic liver lesions  CT abdomen/pelvis 06/17/2020- pancreas tail mass, multiple liver metastases, enlarged periaortic nodes, right ovary metastasis?,  Nodularity at the left paracolic gutter and posterior/inferior stomach suggesting peritoneal tumor spread  CT chest 06/17/2020- scattered small pulmonary nodules consistent with metastatic disease  Ultrasound-guided biopsy of a left liver mass 06/18/2020- adenocarcinoma, morphology compatible with metastatic pancreatic adenocarcinoma 2.Pain secondary to #1 3.Weight loss 4.Family history of pancreas cancer   Disposition: Ms. Wilhide was recently diagnosed with  metastatic pancreas cancer.  She is accompanied by her daughter to today's visit.  Another family member is present by phone.  Dr. Benay Spice reviewed the diagnosis, prognosis and treatment options.  They understand no therapy will be curative.  Dr. Benay Spice discussed treatment options to include systemic therapy versus a supportive care approach.  Ms. Waldo would like to try chemotherapy.  Dr. Benay Spice reviewed different regimens including gemcitabine/Abraxane, FOLFOX/FOLFIRINOX.  Decision made to begin treatment with FOLFOX, add irinotecan in after 2 cycles if she is tolerating well.  Potential toxicities associated with chemotherapy reviewed including bone marrow toxicity, nausea, hair loss, allergic reaction.  The various forms of neuropathy associated with oxaliplatin were discussed.  Potential toxicities associated with irinotecan reviewed including diarrhea which can potentially be severe.  We discussed side effects associated with 5-fluorouracil including mouth sores, diarrhea, hand-foot syndrome, skin hyperpigmentation, increase sensitivity to sun, rash.  She agrees to proceed.  She will attend a chemotherapy education class today.  She has a Port-A-Cath in place.  Prescriptions sent to her pharmacy for Compazine and Emla cream.  Prescription also sent for hydrocodone.  She is interested in a second opinion.  We made a referral to [facility].  She has a sister who died of pancreas cancer.  We made a referral to the genetics counselor.  She will return for cycle 1 FOLFOX 07/01/2020.  We will see her in follow-up prior to cycle 2 on 07/15/2020.  She will contact the office in the interim with any problems.  Patient seen with Dr. Benay Spice.    Ned Card ANP/GNP-BC   06/26/2020  12:16 PM  This was a shared visit with Ned Card.  Ms. Witthuhn was interviewed and examined.  She has been diagnosed with metastatic pancreas cancer.  No therapy be curative.  We discussed treatment options.  We discussed  supportive care versus a trial of systemic chemotherapy.  She would like to proceed with chemotherapy.  We discussed gemcitabine/Abraxane and FOLFIRINOX.  Her general health and performance status appear adequate to receive FOLFIRINOX.  The plan is to begin with FOLFOX for 1 or 2 cycles and then add irinotecan if the FOLFOX is well-tolerated.  We will submit the liver biopsy tissue for [facility] 1 testing.  She will be referred to the genetics counselor.  I reviewed potential toxicities associated with the chemotherapy agents in the FOLFIRINOX regimen.  Ms. Wilfong will attend a chemotherapy teaching class.  The plan is to begin FOLFIRINOX on 07/01/2020.  She requested a second medical oncology opinion.  A chemotherapy plan was entered today.  I was present for greater than 50% of today's visit.  I perform medical decision making.  [name], MD

## 2020-06-27 ENCOUNTER — Encounter: Payer: Self-pay | Admitting: General Practice

## 2020-06-27 ENCOUNTER — Other Ambulatory Visit: Payer: Self-pay | Admitting: *Deleted

## 2020-06-27 DIAGNOSIS — C259 Malignant neoplasm of pancreas, unspecified: Secondary | ICD-10-CM

## 2020-06-27 DIAGNOSIS — Z7189 Other specified counseling: Secondary | ICD-10-CM | POA: Insufficient documentation

## 2020-06-27 DIAGNOSIS — C252 Malignant neoplasm of tail of pancreas: Secondary | ICD-10-CM | POA: Insufficient documentation

## 2020-06-27 NOTE — Progress Notes (Addendum)
[facility] CSW Progress Notes  Request from [facility] to provide daughter Angelle Isais with information on how to apply for Social Security disability benefits.  Mother may not be able to return to work while in treatment for cancer. Spoke w patient and daughter.    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  Family was told "they only thing they could do was to take her PTO time to pay her insurance."  Advised to talk directly to [employer] to determine if she has any short or long term disability benefits available.  Daughter will move back home to care for patient.  Family engaged.  Discussed options for disability assistance - referred to [facility] w patient permission.  Also advised that patient may be able to find affordable health insurance coverage via [employer], sent information on [employer] to daughter for their information.  Daughter has questions about genetic counseling - messages [employer] forces technical [employer] so she can reach out to family w further information.    Edwyna Shell, LCSW Clinical Social Worker Phone:  [phone]

## 2020-06-27 NOTE — Progress Notes (Signed)
Genetics referral order placed per Dr. Benay Spice.

## 2020-06-27 NOTE — Progress Notes (Signed)
START ON PATHWAY REGIMEN - Pancreatic Adenocarcinoma     A cycle is every 14 days:     Oxaliplatin      Leucovorin      Irinotecan      Fluorouracil   **Always confirm dose/schedule in your pharmacy ordering system**  Patient Characteristics: Metastatic Disease, First Line, PS = 0,1, BRCA1/2 and PALB2  Mutation Absent/Unknown Therapeutic Status: Metastatic Disease Line of Therapy: First Line ECOG Performance Status: 1 BRCA1/2 Mutation Status: Awaiting Test Results PALB2 Mutation Status: Awaiting Test Results Intent of Therapy: Non-Curative / Palliative Intent, Discussed with Patient 

## 2020-06-28 ENCOUNTER — Other Ambulatory Visit: Payer: Self-pay | Admitting: Genetic Counselor

## 2020-06-28 ENCOUNTER — Telehealth: Payer: Self-pay | Admitting: Genetic Counselor

## 2020-06-28 DIAGNOSIS — C252 Malignant neoplasm of tail of pancreas: Secondary | ICD-10-CM

## 2020-06-28 NOTE — Telephone Encounter (Signed)
Spoke with patient's daughter regarding her questions about the genetic counseling appointment on Thursday. Clarified that the genetic testing will be for [name], although [facility] and her brother are welcome to be there for the appointment as well.

## 2020-06-30 ENCOUNTER — Other Ambulatory Visit: Payer: Self-pay | Admitting: Oncology

## 2020-07-01 ENCOUNTER — Inpatient Hospital Stay: Payer: 59 | Attending: Oncology

## 2020-07-01 ENCOUNTER — Telehealth: Payer: Self-pay | Admitting: *Deleted

## 2020-07-01 ENCOUNTER — Inpatient Hospital Stay: Payer: 59

## 2020-07-01 ENCOUNTER — Other Ambulatory Visit: Payer: Self-pay

## 2020-07-01 VITALS — BP 149/87 | HR 91 | Temp 97.5°F | Resp 20

## 2020-07-01 DIAGNOSIS — C7800 Secondary malignant neoplasm of unspecified lung: Secondary | ICD-10-CM | POA: Diagnosis not present

## 2020-07-01 DIAGNOSIS — C787 Secondary malignant neoplasm of liver and intrahepatic bile duct: Secondary | ICD-10-CM | POA: Insufficient documentation

## 2020-07-01 DIAGNOSIS — C259 Malignant neoplasm of pancreas, unspecified: Secondary | ICD-10-CM

## 2020-07-01 DIAGNOSIS — Z5111 Encounter for antineoplastic chemotherapy: Secondary | ICD-10-CM | POA: Insufficient documentation

## 2020-07-01 DIAGNOSIS — Z5189 Encounter for other specified aftercare: Secondary | ICD-10-CM | POA: Diagnosis not present

## 2020-07-01 DIAGNOSIS — C252 Malignant neoplasm of tail of pancreas: Secondary | ICD-10-CM

## 2020-07-01 LAB — CBC WITH DIFFERENTIAL (CANCER CENTER ONLY)
Abs Immature Granulocytes: 0.03 10*3/uL (ref 0.00–0.07)
Basophils Absolute: 0.1 10*3/uL (ref 0.0–0.1)
Basophils Relative: 1 %
Eosinophils Absolute: 0 10*3/uL (ref 0.0–0.5)
Eosinophils Relative: 0 %
HCT: 35.6 % — ABNORMAL LOW (ref 36.0–46.0)
Hemoglobin: 11.4 g/dL — ABNORMAL LOW (ref 12.0–15.0)
Immature Granulocytes: 0 %
Lymphocytes Relative: 19 %
Lymphs Abs: 1.8 10*3/uL (ref 0.7–4.0)
MCH: 27.8 pg (ref 26.0–34.0)
MCHC: 32 g/dL (ref 30.0–36.0)
MCV: 86.8 fL (ref 80.0–100.0)
Monocytes Absolute: 0.8 10*3/uL (ref 0.1–1.0)
Monocytes Relative: 9 %
Neutro Abs: 6.7 10*3/uL (ref 1.7–7.7)
Neutrophils Relative %: 71 %
Platelet Count: 506 10*3/uL — ABNORMAL HIGH (ref 150–400)
RBC: 4.1 MIL/uL (ref 3.87–5.11)
RDW: 13.1 % (ref 11.5–15.5)
WBC Count: 9.5 10*3/uL (ref 4.0–10.5)
nRBC: 0 % (ref 0.0–0.2)

## 2020-07-01 LAB — CMP (CANCER CENTER ONLY)
ALT: 37 U/L (ref 0–44)
AST: 40 U/L (ref 15–41)
Albumin: 3.7 g/dL (ref 3.5–5.0)
Alkaline Phosphatase: 561 U/L — ABNORMAL HIGH (ref 38–126)
Anion gap: 9 (ref 5–15)
BUN: 9 mg/dL (ref 8–23)
CO2: 27 mmol/L (ref 22–32)
Calcium: 10.2 mg/dL (ref 8.9–10.3)
Chloride: 99 mmol/L (ref 98–111)
Creatinine: 0.7 mg/dL (ref 0.44–1.00)
GFR, Estimated: >60 mL/min (ref >60)
Glucose, Bld: 95 mg/dL (ref 70–99)
Potassium: 4.1 mmol/L (ref 3.5–5.1)
Sodium: 135 mmol/L (ref 135–145)
Total Bilirubin: 0.9 mg/dL (ref 0.3–1.2)
Total Protein: 7.7 g/dL (ref 6.5–8.1)

## 2020-07-01 MED ORDER — SODIUM CHLORIDE 0.9 % IV SOLN
10.0000 mg | Freq: Once | INTRAVENOUS | Status: AC
  Administered 2020-07-01: 10 mg via INTRAVENOUS
  Filled 2020-07-01: qty 1

## 2020-07-01 MED ORDER — PALONOSETRON HCL INJECTION 0.25 MG/5ML
0.2500 mg | Freq: Once | INTRAVENOUS | Status: AC
  Administered 2020-07-01: 0.25 mg via INTRAVENOUS
  Filled 2020-07-01: qty 5

## 2020-07-01 MED ORDER — SODIUM CHLORIDE 0.9% FLUSH
10.0000 mL | INTRAVENOUS | Status: AC | PRN
  Administered 2020-07-01: 10 mL
  Filled 2020-07-01: qty 10

## 2020-07-01 MED ORDER — SODIUM CHLORIDE 0.9 % IV SOLN
2400.0000 mg/m2 | INTRAVENOUS | Status: DC
  Administered 2020-07-01: 3950 mg via INTRAVENOUS
  Filled 2020-07-01: qty 79

## 2020-07-01 MED ORDER — OXALIPLATIN CHEMO INJECTION 100 MG/20ML
85.0000 mg/m2 | Freq: Once | INTRAVENOUS | Status: AC
  Administered 2020-07-01: 140 mg via INTRAVENOUS
  Filled 2020-07-01: qty 28

## 2020-07-01 MED ORDER — LEUCOVORIN CALCIUM INJECTION 350 MG
400.0000 mg/m2 | Freq: Once | INTRAVENOUS | Status: AC
  Administered 2020-07-01: 660 mg via INTRAVENOUS
  Filled 2020-07-01 (×3): qty 33

## 2020-07-01 MED ORDER — DEXTROSE 5 % IV SOLN
Freq: Once | INTRAVENOUS | Status: AC
  Filled 2020-07-01: qty 250

## 2020-07-01 NOTE — Patient Instructions (Signed)
[facility]  Discharge Instructions: Thank you for choosing [facility] to provide your oncology and hematology care.   If you have a lab appointment with the [facility], please go directly to the [facility] and check in at the registration area.   Wear comfortable clothing and clothing appropriate for easy access to any Portacath or PICC line.   We strive to give you quality time with your provider. You may need to reschedule your appointment if you arrive late (15 or more minutes).  Arriving late affects you and other patients whose appointments are after yours.  Also, if you miss three or more appointments without notifying the office, you may be dismissed from the clinic at the provider's discretion.      For prescription refill requests, have your pharmacy contact our office and allow 72 hours for refills to be completed.    Today you received the following chemotherapy and/or immunotherapy agents OXaliplatin. Leucovorin and Fluorouracil   To help prevent nausea and vomiting after your treatment, we encourage you to take your nausea medication as directed. Compazine every 6 hours as needed for nausea  BELOW ARE SYMPTOMS THAT SHOULD BE REPORTED IMMEDIATELY: . *FEVER GREATER THAN 100.4 F (38 C) OR HIGHER . *CHILLS OR SWEATING . *NAUSEA AND VOMITING THAT IS NOT CONTROLLED WITH YOUR NAUSEA MEDICATION . *UNUSUAL SHORTNESS OF BREATH . *UNUSUAL BRUISING OR BLEEDING . *URINARY PROBLEMS (pain or burning when urinating, or frequent urination) . *BOWEL PROBLEMS (unusual diarrhea, constipation, pain near the anus) . TENDERNESS IN MOUTH AND THROAT WITH OR WITHOUT PRESENCE OF ULCERS (sore throat, sores in mouth, or a toothache) . UNUSUAL RASH, SWELLING OR PAIN  . UNUSUAL VAGINAL DISCHARGE OR ITCHING   Items with * indicate a potential emergency and should be followed up as soon as possible or go to the Emergency Department if any problems should  occur.  Please show the CHEMOTHERAPY ALERT CARD or IMMUNOTHERAPY ALERT CARD at check-in to the Emergency Department and triage nurse.  Should you have questions after your visit or need to cancel or reschedule your appointment, please contact [facility]  Dept: [phone]  and follow the prompts.  Office hours are 8:00 a.m. to 4:30 p.m. Monday - Friday. Please note that voicemails left after 4:00 p.m. may not be returned until the following business day.  We are closed weekends and major holidays. You have access to a nurse at all times for urgent questions. Please call the main number to the clinic Dept: [phone] and follow the prompts.   For any non-urgent questions, you may also contact your provider using MyChart. We now offer e-Visits for anyone 32 and older to request care online for non-urgent symptoms. For details visit [email or web address].   Also download the MyChart app! Go to the app store, search "MyChart", open the app, select [facility], and log in with your MyChart username and password.  Due to Covid, a mask is required upon entering the hospital/clinic. If you do not have a mask, one will be given to you upon arrival. For doctor visits, patients may have 1 support person aged 67 or older with them. For treatment visits, patients cannot have anyone with them due to current Covid guidelines and our immunocompromised population.  [facility] Discharge Instructions for Patients receiving Home Portable Chemo Pump    **The bag should finish at 46 hours, 96 hours or 7 days. For example, if your  pump is scheduled for 46 hours and it was put on at 4pm, it should finish at 2 pm the day it is scheduled to come off regardless of your appointment time.    Estimated time to finish   _________________________ (Have your nurse fill in)     ** if the display on your pump reads "Low Volume" and it is beeping, take the batteries out of the pump  and come to the cancer center for it to be taken off.   **If the pump alarms go off prior to the pump reading "Low Volume" then call the [phone] and someone can assist you.  **If the plunger comes out and the bag fluid is running out, please use your chemo spill kit to clean up the spill. Do not use paper towels or other house hold products.  ** If you have problems or questions regarding your pump, please call either the [phone] or the cancer center Monday-Friday 8:00am-4:30pm at [phone] and we will assist you.  If you are unable to get assistance then go to [facility] Emergency Room, ask the staff to contact the IV team for assistance.   Fluorouracil, 5-FU injection What is this medicine? FLUOROURACIL, 5-FU (flure oh YOOR a sil) is a chemotherapy drug. It slows the growth of cancer cells. This medicine is used to treat many types of cancer like breast cancer, colon or rectal cancer, pancreatic cancer, and stomach cancer. This medicine may be used for other purposes; ask your health care provider or pharmacist if you have questions. COMMON BRAND NAME(S): Adrucil What should I tell my health care provider before I take this medicine? They need to know if you have any of these conditions:  blood disorders  dihydropyrimidine dehydrogenase (DPD) deficiency  infection (especially a virus infection such as chickenpox, cold sores, or herpes)  kidney disease  liver disease  malnourished, poor nutrition  recent or ongoing radiation therapy  an unusual or allergic reaction to fluorouracil, other chemotherapy, other medicines, foods, dyes, or preservatives  pregnant or trying to get pregnant  breast-feeding How should I use this medicine? This drug is given as an infusion or injection into a vein. It is administered in a hospital or clinic by a specially trained health care professional. Talk to your pediatrician regarding the use of this medicine in children.  Special care may be needed. Overdosage: If you think you have taken too much of this medicine contact a poison control center or emergency room at once. NOTE: This medicine is only for you. Do not share this medicine with others. What if I miss a dose? It is important not to miss your dose. Call your doctor or health care professional if you are unable to keep an appointment. What may interact with this medicine? Do not take this medicine with any of the following medications:  live virus vaccines This medicine may also interact with the following medications:  medicines that treat or prevent blood clots like warfarin, enoxaparin, and dalteparin This list may not describe all possible interactions. Give your health care provider a list of all the medicines, herbs, non-prescription drugs, or dietary supplements you use. Also tell them if you smoke, drink alcohol, or use illegal drugs. Some items may interact with your medicine. What should I watch for while using this medicine? Visit your doctor for checks on your progress. This drug may make you feel generally unwell. This is not uncommon, as chemotherapy can affect healthy cells as well as cancer  cells. Report any side effects. Continue your course of treatment even though you feel ill unless your doctor tells you to stop. In some cases, you may be given additional medicines to help with side effects. Follow all directions for their use. Call your doctor or health care professional for advice if you get a fever, chills or sore throat, or other symptoms of a cold or flu. Do not treat yourself. This drug decreases your body's ability to fight infections. Try to avoid being around people who are sick. This medicine may increase your risk to bruise or bleed. Call your doctor or health care professional if you notice any unusual bleeding. Be careful brushing and flossing your teeth or using a toothpick because you may get an infection or bleed more  easily. If you have any dental work done, tell your dentist you are receiving this medicine. Avoid taking products that contain aspirin, acetaminophen, ibuprofen, naproxen, or ketoprofen unless instructed by your doctor. These medicines may hide a fever. Do not become pregnant while taking this medicine. Women should inform their doctor if they wish to become pregnant or think they might be pregnant. There is a potential for serious side effects to an unborn child. Talk to your health care professional or pharmacist for more information. Do not breast-feed an infant while taking this medicine. Men should inform their doctor if they wish to father a child. This medicine may lower sperm counts. Do not treat diarrhea with over the counter products. Contact your doctor if you have diarrhea that lasts more than 2 days or if it is severe and watery. This medicine can make you more sensitive to the sun. Keep out of the sun. If you cannot avoid being in the sun, wear protective clothing and use sunscreen. Do not use sun lamps or tanning beds/booths. What side effects may I notice from receiving this medicine? Side effects that you should report to your doctor or health care professional as soon as possible:  allergic reactions like skin rash, itching or hives, swelling of the face, lips, or tongue  low blood counts - this medicine may decrease the number of white blood cells, red blood cells and platelets. You may be at increased risk for infections and bleeding.  signs of infection - fever or chills, cough, sore throat, pain or difficulty passing urine  signs of decreased platelets or bleeding - bruising, pinpoint red spots on the skin, black, tarry stools, blood in the urine  signs of decreased red blood cells - unusually weak or tired, fainting spells, lightheadedness  breathing problems  changes in vision  chest pain  mouth sores  nausea and vomiting  pain, swelling, redness at site where  injected  pain, tingling, numbness in the hands or feet  redness, swelling, or sores on hands or feet  stomach pain  unusual bleeding Side effects that usually do not require medical attention (report to your doctor or health care professional if they continue or are bothersome):  changes in finger or toe nails  diarrhea  dry or itchy skin  hair loss  headache  loss of appetite  sensitivity of eyes to the light  stomach upset  unusually teary eyes This list may not describe all possible side effects. Call your doctor for medical advice about side effects. You may report side effects to FDA at 1-800-FDA-1088. Where should I keep my medicine? This drug is given in a hospital or clinic and will not be stored at home. NOTE: This  sheet is a summary. It may not cover all possible information. If you have questions about this medicine, talk to your doctor, pharmacist, or health care provider.  2021 Elsevier/Gold Standard (2019-01-17 15:00:03) Leucovorin injection What is this medicine? LEUCOVORIN (loo koe VOR in) is used to prevent or treat the harmful effects of some medicines. This medicine is used to treat anemia caused by a low amount of folic acid in the body. It is also used with 5-fluorouracil (5-FU) to treat colon cancer. This medicine may be used for other purposes; ask your health care provider or pharmacist if you have questions. What should I tell my health care provider before I take this medicine? They need to know if you have any of these conditions:  anemia from low levels of vitamin B-12 in the blood  an unusual or allergic reaction to leucovorin, folic acid, other medicines, foods, dyes, or preservatives  pregnant or trying to get pregnant  breast-feeding How should I use this medicine? This medicine is for injection into a muscle or into a vein. It is given by a health care professional in a hospital or clinic setting. Talk to your pediatrician regarding  the use of this medicine in children. Special care may be needed. Overdosage: If you think you have taken too much of this medicine contact a poison control center or emergency room at once. NOTE: This medicine is only for you. Do not share this medicine with others. What if I miss a dose? This does not apply. What may interact with this medicine?  capecitabine  fluorouracil  phenobarbital  phenytoin  primidone  trimethoprim-sulfamethoxazole This list may not describe all possible interactions. Give your health care provider a list of all the medicines, herbs, non-prescription drugs, or dietary supplements you use. Also tell them if you smoke, drink alcohol, or use illegal drugs. Some items may interact with your medicine. What should I watch for while using this medicine? Your condition will be monitored carefully while you are receiving this medicine. This medicine may increase the side effects of 5-fluorouracil, 5-FU. Tell your doctor or health care professional if you have diarrhea or mouth sores that do not get better or that get worse. What side effects may I notice from receiving this medicine? Side effects that you should report to your doctor or health care professional as soon as possible:  allergic reactions like skin rash, itching or hives, swelling of the face, lips, or tongue  breathing problems  fever, infection  mouth sores  unusual bleeding or bruising  unusually weak or tired Side effects that usually do not require medical attention (report to your doctor or health care professional if they continue or are bothersome):  constipation or diarrhea  loss of appetite  nausea, vomiting This list may not describe all possible side effects. Call your doctor for medical advice about side effects. You may report side effects to FDA at 1-800-FDA-1088. Where should I keep my medicine? This drug is given in a hospital or clinic and will not be stored at home. NOTE:  This sheet is a summary. It may not cover all possible information. If you have questions about this medicine, talk to your doctor, pharmacist, or health care provider.  2021 Elsevier/Gold Standard (2007-08-23 16:50:29) Oxaliplatin Injection What is this medicine? OXALIPLATIN (ox AL i PLA tin) is a chemotherapy drug. It targets fast dividing cells, like cancer cells, and causes these cells to die. This medicine is used to treat cancers of the colon and  rectum, and many other cancers. This medicine may be used for other purposes; ask your health care provider or pharmacist if you have questions. COMMON BRAND NAME(S): Eloxatin What should I tell my health care provider before I take this medicine? They need to know if you have any of these conditions:  heart disease  history of irregular heartbeat  liver disease  low blood counts, like white cells, platelets, or red blood cells  lung or breathing disease, like asthma  take medicines that treat or prevent blood clots  tingling of the fingers or toes, or other nerve disorder  an unusual or allergic reaction to oxaliplatin, other chemotherapy, other medicines, foods, dyes, or preservatives  pregnant or trying to get pregnant  breast-feeding How should I use this medicine? This drug is given as an infusion into a vein. It is administered in a hospital or clinic by a specially trained health care professional. Talk to your pediatrician regarding the use of this medicine in children. Special care may be needed. Overdosage: If you think you have taken too much of this medicine contact a poison control center or emergency room at once. NOTE: This medicine is only for you. Do not share this medicine with others. What if I miss a dose? It is important not to miss a dose. Call your doctor or health care professional if you are unable to keep an appointment. What may interact with this medicine? Do not take this medicine with any of the  following medications:  cisapride  dronedarone  pimozide  thioridazine This medicine may also interact with the following medications:  aspirin and aspirin-like medicines  certain medicines that treat or prevent blood clots like warfarin, apixaban, dabigatran, and rivaroxaban  cisplatin  cyclosporine  diuretics  medicines for infection like acyclovir, adefovir, amphotericin B, bacitracin, cidofovir, foscarnet, ganciclovir, gentamicin, pentamidine, vancomycin  NSAIDs, medicines for pain and inflammation, like ibuprofen or naproxen  other medicines that prolong the QT interval (an abnormal heart rhythm)  pamidronate  zoledronic acid This list may not describe all possible interactions. Give your health care provider a list of all the medicines, herbs, non-prescription drugs, or dietary supplements you use. Also tell them if you smoke, drink alcohol, or use illegal drugs. Some items may interact with your medicine. What should I watch for while using this medicine? Your condition will be monitored carefully while you are receiving this medicine. You may need blood work done while you are taking this medicine. This medicine may make you feel generally unwell. This is not uncommon as chemotherapy can affect healthy cells as well as cancer cells. Report any side effects. Continue your course of treatment even though you feel ill unless your healthcare professional tells you to stop. This medicine can make you more sensitive to cold. Do not drink cold drinks or use ice. Cover exposed skin before coming in contact with cold temperatures or cold objects. When out in cold weather wear warm clothing and cover your mouth and nose to warm the air that goes into your lungs. Tell your doctor if you get sensitive to the cold. Do not become pregnant while taking this medicine or for 9 months after stopping it. Women should inform their health care professional if they wish to become pregnant or  think they might be pregnant. Men should not father a child while taking this medicine and for 6 months after stopping it. There is potential for serious side effects to an unborn child. Talk to your health care  professional for more information. Do not breast-feed a child while taking this medicine or for 3 months after stopping it. This medicine has caused ovarian failure in some women. This medicine may make it more difficult to get pregnant. Talk to your health care professional if you are concerned about your fertility. This medicine has caused decreased sperm counts in some men. This may make it more difficult to father a child. Talk to your health care professional if you are concerned about your fertility. This medicine may increase your risk of getting an infection. Call your health care professional for advice if you get a fever, chills, or sore throat, or other symptoms of a cold or flu. Do not treat yourself. Try to avoid being around people who are sick. Avoid taking medicines that contain aspirin, acetaminophen, ibuprofen, naproxen, or ketoprofen unless instructed by your health care professional. These medicines may hide a fever. Be careful brushing or flossing your teeth or using a toothpick because you may get an infection or bleed more easily. If you have any dental work done, tell your dentist you are receiving this medicine. What side effects may I notice from receiving this medicine? Side effects that you should report to your doctor or health care professional as soon as possible:  allergic reactions like skin rash, itching or hives, swelling of the face, lips, or tongue  breathing problems  cough  low blood counts - this medicine may decrease the number of white blood cells, red blood cells, and platelets. You may be at increased risk for infections and bleeding  nausea, vomiting  pain, redness, or irritation at site where injected  pain, tingling, numbness in the hands  or feet  signs and symptoms of bleeding such as bloody or black, tarry stools; red or dark brown urine; spitting up blood or brown material that looks like coffee grounds; red spots on the skin; unusual bruising or bleeding from the eyes, gums, or nose  signs and symptoms of a dangerous change in heartbeat or heart rhythm like chest pain; dizziness; fast, irregular heartbeat; palpitations; feeling faint or lightheaded; falls  signs and symptoms of infection like fever; chills; cough; sore throat; pain or trouble passing urine  signs and symptoms of liver injury like dark yellow or brown urine; general ill feeling or flu-like symptoms; light-colored stools; loss of appetite; nausea; right upper belly pain; unusually weak or tired; yellowing of the eyes or skin  signs and symptoms of low red blood cells or anemia such as unusually weak or tired; feeling faint or lightheaded; falls  signs and symptoms of muscle injury like dark urine; trouble passing urine or change in the amount of urine; unusually weak or tired; muscle pain; back pain Side effects that usually do not require medical attention (report to your doctor or health care professional if they continue or are bothersome):  changes in taste  diarrhea  gas  hair loss  loss of appetite  mouth sores This list may not describe all possible side effects. Call your doctor for medical advice about side effects. You may report side effects to FDA at 1-800-FDA-1088. Where should I keep my medicine? This drug is given in a hospital or clinic and will not be stored at home. NOTE: This sheet is a summary. It may not cover all possible information. If you have questions about this medicine, talk to your doctor, pharmacist, or health care provider.  2021 Elsevier/Gold Standard (2018-07-06 12:20:35)

## 2020-07-01 NOTE — Telephone Encounter (Signed)
Unable to complete PA for hydrocodone-apap on covermymeds. Stated she was ineligible. Called daughter and was told that her insurance changed to [facility] on 06/30/20 and they do not have the new insurance card yet or ID #. Informed her that PA can't be done without the insurance information. Suggested she call [employer] and find out what the out-of-pocket cost is on this medication (should not be too much). Sent staff message to financial advocate to determine if she would qualify for the [employer].

## 2020-07-02 ENCOUNTER — Telehealth: Payer: Self-pay | Admitting: *Deleted

## 2020-07-02 ENCOUNTER — Other Ambulatory Visit: Payer: Self-pay | Admitting: Oncology

## 2020-07-02 LAB — CANCER ANTIGEN 19-9: CA 19-9: 65661 U/mL — ABNORMAL HIGH (ref 0–35)

## 2020-07-02 NOTE — Telephone Encounter (Signed)
Called patient to f/u on status since 1st FOLFOX on 07/01/20. Daughter reports no adverse effects thus far and they have no questions. Did state the wish for the dietician appointment to be in person. Provided directions to [facility] to see dietician and to check in at front desk. Notified dietician of request.

## 2020-07-03 ENCOUNTER — Other Ambulatory Visit: Payer: Self-pay

## 2020-07-03 ENCOUNTER — Inpatient Hospital Stay: Payer: 59

## 2020-07-03 ENCOUNTER — Encounter (HOSPITAL_COMMUNITY): Payer: Self-pay | Admitting: Oncology

## 2020-07-03 VITALS — BP 162/88 | HR 98 | Temp 98.3°F | Resp 18

## 2020-07-03 DIAGNOSIS — Z5111 Encounter for antineoplastic chemotherapy: Secondary | ICD-10-CM | POA: Diagnosis not present

## 2020-07-03 DIAGNOSIS — C252 Malignant neoplasm of tail of pancreas: Secondary | ICD-10-CM

## 2020-07-03 MED ORDER — SODIUM CHLORIDE 0.9% FLUSH
10.0000 mL | INTRAVENOUS | Status: DC | PRN
Start: 2020-07-03 — End: 2020-07-03
  Administered 2020-07-03: 10 mL
  Filled 2020-07-03: qty 10

## 2020-07-03 MED ORDER — HEPARIN SOD (PORK) LOCK FLUSH 100 UNIT/ML IV SOLN
500.0000 [IU] | Freq: Once | INTRAVENOUS | Status: AC | PRN
  Administered 2020-07-03: 500 [IU]
  Filled 2020-07-03: qty 5

## 2020-07-04 ENCOUNTER — Inpatient Hospital Stay (HOSPITAL_BASED_OUTPATIENT_CLINIC_OR_DEPARTMENT_OTHER): Payer: 59 | Admitting: Genetic Counselor

## 2020-07-04 ENCOUNTER — Encounter: Payer: Self-pay | Admitting: Genetic Counselor

## 2020-07-04 DIAGNOSIS — Z8049 Family history of malignant neoplasm of other genital organs: Secondary | ICD-10-CM | POA: Diagnosis not present

## 2020-07-04 DIAGNOSIS — C252 Malignant neoplasm of tail of pancreas: Secondary | ICD-10-CM

## 2020-07-04 DIAGNOSIS — Z800 Family history of malignant neoplasm of digestive organs: Secondary | ICD-10-CM | POA: Diagnosis not present

## 2020-07-04 NOTE — Progress Notes (Signed)
REFERRING PROVIDER: Ladell Pier, MD [address] [place],  [place] [address]  PRIMARY PROVIDER:  Pcp, No  PRIMARY REASON FOR VISIT:  1. 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 Family history of pancreatic cancer   3. Family history of uterine cancer   4. Family history of stomach cancer      HISTORY OF PRESENT ILLNESS:   Kirsten [name], a 60 y.o. female, was seen for a [facility] cancer genetics consultation at the request of Dr. Benay Spice due to a personal and family history of cancer.  Kirsten [name] presents to clinic today with her daughter (in person) and her son (virtually) to discuss the possibility of a hereditary predisposition to cancer, genetic testing, and to further clarify her future cancer risks, as well as potential cancer risks for family members.   In April of 2022, at the age of 81, Kirsten [name] was diagnosed with pancreatic adenocarcinoma. The treatment plan includes chemotherapy.   CANCER HISTORY:  Oncology History  Cancer of pancreas, tail ([facility])  06/27/2020 Initial Diagnosis   Cancer of pancreas, tail ([facility])   06/27/2020 Cancer Staging   Staging form: Exocrine Pancreas, AJCC 8th Edition - Clinical: Stage IV (cT3, cNX, pM1) - Signed by Ladell Pier, MD on 06/27/2020   07/01/2020 -  Chemotherapy    Patient is on Treatment Plan: PANCREAS MODIFIED FOLFIRINOX Q14D X 4 CYCLES         Past Medical History:  Diagnosis Date  . Family history of pancreatic cancer   . Family history of stomach cancer   . Family history of uterine cancer     Past Surgical History:  Procedure Laterality Date  . IR IMAGING GUIDED PORT INSERTION  06/20/2020    Social History   Socioeconomic History  . Marital status: Single    Spouse name: Not on file  . Number of children: Not on file  . Years of education: Not on file  . Highest education level: Not on file  Occupational History  . Not on file  Tobacco Use  . Smoking status: Not on file  . Smokeless tobacco: Not  on file  Substance and Sexual Activity  . Alcohol use: Not on file  . Drug use: Not on file  . Sexual activity: Not on file  Other Topics Concern  . Not on file  Social History Narrative  . Not on file   Social Determinants of Health   Financial Resource Strain: Not on file  Food Insecurity: Not on file  Transportation Needs: Not on file  Physical Activity: Not on file  Stress: Not on file  Social Connections: Not on file     FAMILY HISTORY:  We obtained a detailed, 4-generation family history.  Significant diagnoses are listed below: Family History  Problem Relation Age of Onset  . Uterine cancer Mother        dx <50  . Stomach cancer Father        dx <50  . Dementia Father   . Pancreatic cancer Sister 24  . Cancer Cousin 26       unknown type, maternal first cousin    Kirsten [name] has one daughter (age 71) and one son (age 75). She had six full-brothers, three full-sisters, one maternal half-sister, two maternal half-brothers, one paternal half-sister, and three paternal half-brothers. One full-sister died from pancreatic cancer at age 47, and this sister's daughter had negative genetic testing (result not reviewed).  Kirsten [name] mother died at age  46 and had a history of uterine cancer diagnosed younger than 24. There were three maternal aunts and one maternal uncle (all half-siblings to her mother). There is no known cancer among maternal aunts/uncles. One maternal first cousin may have had cancer (unknown type) in his mid-34s. Kirsten [name] does not have information about her maternal grandparents.  Kirsten [name] father is alive at age 3 and has a history of stomach cancer diagnosed younger than 73. There was one paternal aunt and 65 paternal uncles (all half-siblings to her father). There is no known cancer among paternal aunts/uncles or paternal cousins. Kirsten [name] paternal grandmother died in her 86s without cancer. Her paternal grandfather died older than 40 without  cancer.   Kirsten [name] is aware of previous family history of genetic testing for hereditary cancer risks. Patient's maternal ancestors are of [place] descent, and paternal ancestors are of unknown descent. There is no reported Ashkenazi Jewish ancestry. There is no known consanguinity.  GENETIC COUNSELING ASSESSMENT: Kirsten [name] is a 60 y.o. female with a personal history of pancreatic cancer and a family history of pancreatic cancer, stomach cancer, and uterine cancer, which is somewhat suggestive of a hereditary cancer syndrome and predisposition to cancer. We, therefore, discussed and recommended the following at today's visit.   DISCUSSION: We discussed that, in general, most cancer is not inherited in families, but instead is sporadic or familial. Sporadic cancers occur by chance and typically happen at older ages (>50 years) as this type of cancer is caused by genetic changes acquired during an individual's lifetime. Some families have more cancers than would be expected by chance; however, the ages or types of cancer are not consistent with a known genetic mutation or known genetic mutations have been ruled out. This type of familial cancer is thought to be due to a combination of multiple genetic, environmental, hormonal, and lifestyle factors. While this combination of factors likely increases the risk of cancer, the exact source of this risk is not currently identifiable or testable.    We discussed that approximately 5-10% of cancer is hereditary, meaning that it is due to a mutation in a single gene that is passed down from generation to generation in a family. Most hereditary cases of pancreatic cancer are associated with the BRCA1 and BRCA2 genes. There are other genes that can be associated an increased risk for pancreatic cancer, including the Lynch syndrome genes, APC, ATM, CDKN2A, PALB2, etc. We discussed that testing is beneficial for several reasons, including identifying  whether targeted treatment options such as PARP inhibitors would be beneficial, knowing about other cancer risks, identifying potential screening and risk-reduction options that may be appropriate, and to understand if other family members could be at risk for cancer and allow them to undergo genetic testing.  We reviewed the characteristics, features and inheritance patterns of hereditary cancer syndromes. We also discussed genetic testing, including the appropriate family members to test, the process of testing, insurance coverage and turn-around-time for results. We discussed the implications of a negative, positive and/or variant of uncertain significant result. We recommended Ms. Wynetta Emery pursue genetic testing for the [employer] and Pancreatic Cancer panel.   The Multi-Cancer Panel offered by [facility] includes sequencing and/or deletion duplication testing of the following 84 genes: AIP, ALK, APC, ATM, AXIN2,BAP1,  BARD1, BLM, BMPR1A, BRCA1, BRCA2, BRIP1, CASR, CDC73, CDH1, CDK4, CDKN1B, CDKN1C, CDKN2A (p14ARF), CDKN2A (p16INK4a), CEBPA, CHEK2, CTNNA1, DICER1, DIS3L2, EGFR (c.2369C>T, p.Thr790Met variant only), EPCAM (Deletion/duplication testing only), FH, FLCN,  GATA2, GPC3, GREM1 (Promoter region deletion/duplication testing only), HOXB13 (c.251G>A, p.Gly84Glu), HRAS, KIT, MAX, MEN1, MET, MITF (c.952G>A, p.Glu318Lys variant only), MLH1, MSH2, MSH3, MSH6, MUTYH, NBN, NF1, NF2, NTHL1, PALB2, PDGFRA, PHOX2B, PMS2, POLD1, POLE, POT1, PRKAR1A, PTCH1, PTEN, RAD50, RAD51C, RAD51D, RB1, RECQL4, RET, RUNX1, SDHAF2, SDHA (sequence changes only), SDHB, SDHC, SDHD, SMAD4, SMARCA4, SMARCB1, SMARCE1, STK11, SUFU, TERC, TERT, TMEM127, TP53, TSC1, TSC2, VHL, WRN and WT1. The Pancreatic Cancer Panel offered by [facility] includes sequencing and deletion/duplication analysis of the following 29 genes: APC, ATM, BMPR1A, BRCA1, BRCA2, CASR, CDK4, CDKN2A, CFTR, CPA1, CTRC, EPCAM, FANCC, MEN1, MLH1, MSH2, MSH6,  NF1, PALB2, PALLD, PMS2, PRSS1, SMAD4, SPINK1, STK11, TP53, TSC1, TSC2, and VHL.   Based on Ms. Mangrum's personal and family history of cancer, she meets medical criteria for genetic testing. Despite that she meets criteria, there may still be an out of pocket cost. Ms. Bensen expressed some concern regarding the cost of testing. We completed [facility]'s patient assistance program application, which may reduce the cost of testing for Ms. Armendariz if approved.  PLAN: After considering the risks, benefits, and limitations, Ms. Geiselman provided informed consent to pursue genetic testing and the blood sample was sent to [facility] for analysis of the Multi-Cancer panel + Pancreatic Cancer panel. Results should be available within approximately two-three weeks' time, at which point they will be disclosed by telephone to Ms. Phang, as will any additional recommendations warranted by these results. Ms. Wessling will receive a summary of her genetic counseling visit and a copy of her results once available. This information will also be available in Epic.   Ms. Casella questions were answered to her satisfaction today. Our contact information was provided should additional questions or concerns arise. Thank you for the referral and allowing [place] to share in the care of your patient.   Clint Guy, [place], [facility] Licensed, Certified Dispensing [email or web address] Phone: [phone]  The patient was seen for a total of 40 minutes in face-to-face genetic counseling.  This patient was discussed with Drs. Magrinat, Lindi Adie and/or Burr Medico who agrees with the above.    _______________________________________________________________________ For Office Staff:  Number of people involved in session: 1 Was an Intern/ student involved with case: no

## 2020-07-08 ENCOUNTER — Other Ambulatory Visit: Payer: Self-pay

## 2020-07-08 ENCOUNTER — Inpatient Hospital Stay: Payer: 59 | Admitting: Nutrition

## 2020-07-08 NOTE — Progress Notes (Signed)
60 year old female diagnosed with metastatic pancreas cancer.  She is followed by Dr. Benay Spice and receives FOLFOX and irinotecan after 2 treatments.  Past medical history not significant.  Medications include Dulcolax, MiraLAX, Compazine, Senokot.  Labs were reviewed.  Height: 5 feet 3 inches. Weight: 130.6 pounds May 9. Usual body weight: 150-160 pounds per patient. BMI: 23.13.  Patient presents in a wheelchair with her daughter.  She receives treatment at [facility] with Dr. Benay Spice.  She reports early satiety and nausea.  She states she has been afraid to eat because she does not know what she is supposed to eat.  She could not eat after she smelled fried foods.  She is requesting information on easy to purchase and prepare foods that are not too expensive.  She has been drinking some boost breeze but has been afraid to try milk-based supplements.  She denies constipation and diarrhea.  Nutrition diagnosis:  Unintended weight loss related to pancreas cancer and associated treatments as evidenced by approximately 13% weight loss from usual body weight.  Intervention: Educated patient on importance of smaller more frequent meals with snacks between meals.  Encouraged higher calorie higher protein foods as tolerated. Reviewed high-protein foods and made suggestions for snacks.  Provided handouts. Encouraged patient to try different oral nutrition supplements and provided samples. Reviewed strategies for improving nausea with nausea medication.  Provided handout on taste and smell alterations. Discussed cold sensitivity while receiving oxaliplatin.  Provided nutrition facts sheet.  Also gave patient warm recipes to use directly after she receives oxaliplatin. Questions were answered.  Teach back method used.  Contact information provided.  Monitoring, evaluation, goals: Patient will tolerate increased calories and protein to minimize further weight loss.  Next visit: Friday, May 27 with  Vinnie Level.  **Disclaimer: This note was dictated with voice recognition software. Similar sounding words can inadvertently be transcribed and this note may contain transcription errors which may not have been corrected upon publication of note.**

## 2020-07-09 LAB — GENETIC SCREENING ORDER

## 2020-07-12 ENCOUNTER — Encounter (HOSPITAL_COMMUNITY): Payer: Self-pay

## 2020-07-14 ENCOUNTER — Other Ambulatory Visit: Payer: Self-pay | Admitting: Oncology

## 2020-07-15 ENCOUNTER — Inpatient Hospital Stay: Payer: 59 | Admitting: Oncology

## 2020-07-15 ENCOUNTER — Inpatient Hospital Stay: Payer: 59

## 2020-07-15 ENCOUNTER — Other Ambulatory Visit: Payer: Self-pay

## 2020-07-15 VITALS — BP 155/85 | HR 88 | Temp 98.0°F | Resp 20 | Ht 63.0 in | Wt 127.8 lb

## 2020-07-15 DIAGNOSIS — C252 Malignant neoplasm of tail of pancreas: Secondary | ICD-10-CM

## 2020-07-15 DIAGNOSIS — Z5111 Encounter for antineoplastic chemotherapy: Secondary | ICD-10-CM | POA: Diagnosis not present

## 2020-07-15 DIAGNOSIS — C787 Secondary malignant neoplasm of liver and intrahepatic bile duct: Secondary | ICD-10-CM

## 2020-07-15 DIAGNOSIS — C259 Malignant neoplasm of pancreas, unspecified: Secondary | ICD-10-CM

## 2020-07-15 LAB — CBC WITH DIFFERENTIAL (CANCER CENTER ONLY)
Abs Immature Granulocytes: 0.02 10*3/uL (ref 0.00–0.07)
Basophils Absolute: 0.1 10*3/uL (ref 0.0–0.1)
Basophils Relative: 1 %
Eosinophils Absolute: 0.1 10*3/uL (ref 0.0–0.5)
Eosinophils Relative: 1 %
HCT: 33.1 % — ABNORMAL LOW (ref 36.0–46.0)
Hemoglobin: 10.5 g/dL — ABNORMAL LOW (ref 12.0–15.0)
Immature Granulocytes: 0 %
Lymphocytes Relative: 28 %
Lymphs Abs: 2.2 10*3/uL (ref 0.7–4.0)
MCH: 28.1 pg (ref 26.0–34.0)
MCHC: 31.7 g/dL (ref 30.0–36.0)
MCV: 88.5 fL (ref 80.0–100.0)
Monocytes Absolute: 0.9 10*3/uL (ref 0.1–1.0)
Monocytes Relative: 12 %
Neutro Abs: 4.6 10*3/uL (ref 1.7–7.7)
Neutrophils Relative %: 58 %
Platelet Count: 492 10*3/uL — ABNORMAL HIGH (ref 150–400)
RBC: 3.74 MIL/uL — ABNORMAL LOW (ref 3.87–5.11)
RDW: 13.3 % (ref 11.5–15.5)
WBC Count: 7.8 10*3/uL (ref 4.0–10.5)
nRBC: 0 % (ref 0.0–0.2)

## 2020-07-15 LAB — CMP (CANCER CENTER ONLY)
ALT: 24 U/L (ref 0–44)
AST: 29 U/L (ref 15–41)
Albumin: 3.5 g/dL (ref 3.5–5.0)
Alkaline Phosphatase: 536 U/L — ABNORMAL HIGH (ref 38–126)
Anion gap: 9 (ref 5–15)
BUN: 12 mg/dL (ref 8–23)
CO2: 27 mmol/L (ref 22–32)
Calcium: 9.7 mg/dL (ref 8.9–10.3)
Chloride: 100 mmol/L (ref 98–111)
Creatinine: 0.85 mg/dL (ref 0.44–1.00)
GFR, Estimated: >60 mL/min (ref >60)
Glucose, Bld: 154 mg/dL — ABNORMAL HIGH (ref 70–99)
Potassium: 3.9 mmol/L (ref 3.5–5.1)
Sodium: 136 mmol/L (ref 135–145)
Total Bilirubin: 0.6 mg/dL (ref 0.3–1.2)
Total Protein: 7.3 g/dL (ref 6.5–8.1)

## 2020-07-15 NOTE — Patient Instructions (Signed)

## 2020-07-15 NOTE — Progress Notes (Signed)
  [facility] OFFICE PROGRESS NOTE   Diagnosis: Pancreas cancer  INTERVAL HISTORY:   Kirsten [name] completed cycle of FOLFOX on 07/01/2020.  She had cold sensitivity when she drank fluid.  She has tingling and mild numbness in the fingers today.  Abdominal pain is improved.  Good appetite.  Objective:  Vital signs in last 24 hours:  Blood pressure (!) 155/85, pulse 88, temperature 98 F (36.7 C), temperature source Oral, resp. rate 20, height 5\' 3"  (1.6 m), weight 127 lb 12.8 oz (58 kg), SpO2 100 %.    HEENT: No thrush or ulcers Resp: Lungs clear bilaterally Cardio: Regular rate and rhythm GI: Fullness in the mid and right upper abdomen with mild tenderness Vascular: No leg edema  Skin: Palms without erythema  Portacath/PICC-without erythema  Lab Results:  Lab Results  Component Value Date   WBC 7.8 07/15/2020   HGB 10.5 (L) 07/15/2020   HCT 33.1 (L) 07/15/2020   MCV 88.5 07/15/2020   PLT 492 (H) 07/15/2020   NEUTROABS 4.6 07/15/2020    CMP  Lab Results  Component Value Date   NA 136 07/15/2020   K 3.9 07/15/2020   CL 100 07/15/2020   CO2 27 07/15/2020   GLUCOSE 154 (H) 07/15/2020   BUN 12 07/15/2020   CREATININE 0.85 07/15/2020   CALCIUM 9.7 07/15/2020   PROT 7.3 07/15/2020   ALBUMIN 3.5 07/15/2020   AST 29 07/15/2020   ALT 24 07/15/2020   ALKPHOS 536 (H) 07/15/2020   BILITOT 0.6 07/15/2020   GFRNONAA >60 07/15/2020    Medications: I have reviewed the patient's current medications.   Assessment/Plan:  1.  Metastatic pancreas cancer  Right upper quadrant ultrasound 06/17/2020-multiple hypoechoic liver lesions  CT abdomen/pelvis 06/17/2020- pancreas tail mass, multiple liver metastases, enlarged periaortic nodes, right ovary metastasis?,  Nodularity at the left paracolic gutter and posterior/inferior stomach suggesting peritoneal tumor spread  CT chest 06/17/2020- scattered small pulmonary nodules consistent with metastatic  disease  Ultrasound-guided biopsy of a left liver mass 06/18/2020- adenocarcinoma, morphology compatible with metastatic pancreatic adenocarcinoma  Cycle 1 FOLFOX 07/01/2020  Cycle 2 FOLFOX 07/16/2020 2.Pain secondary to #1 3.Weight loss 4.Family history of pancreas cancer    Disposition: Kirsten [name] completed 1 cycle of FOLFOX on 07/01/2020.  She tolerated the chemotherapy well.  Her overall performance status appears improved.  She has lost weight over the past few weeks.  I dose adjusted the chemotherapy based on her weight loss.  We will hold on adding irinotecan with cycle 2.  She will return tomorrow for cycle 2 FOLFOX.  We will consider adding irinotecan with cycle 3 based on her performance status and tolerance of cycle 2.  Kirsten [name] will return for an office visit in the next cycle of chemotherapy in 2 weeks.  [name], MD  07/15/2020  11:14 AM

## 2020-07-15 NOTE — Progress Notes (Signed)
Gave patient gas card today.

## 2020-07-16 ENCOUNTER — Inpatient Hospital Stay: Payer: 59

## 2020-07-16 ENCOUNTER — Telehealth: Payer: Self-pay | Admitting: Genetic Counselor

## 2020-07-16 VITALS — BP 151/87 | HR 77 | Temp 98.7°F | Resp 18

## 2020-07-16 DIAGNOSIS — Z5111 Encounter for antineoplastic chemotherapy: Secondary | ICD-10-CM | POA: Diagnosis not present

## 2020-07-16 DIAGNOSIS — C252 Malignant neoplasm of tail of pancreas: Secondary | ICD-10-CM

## 2020-07-16 MED ORDER — SODIUM CHLORIDE 0.9 % IV SOLN
2400.0000 mg/m2 | INTRAVENOUS | Status: DC
  Administered 2020-07-16: 3850 mg via INTRAVENOUS
  Filled 2020-07-16: qty 77

## 2020-07-16 MED ORDER — SODIUM CHLORIDE 0.9 % IV SOLN
10.0000 mg | Freq: Once | INTRAVENOUS | Status: AC
  Administered 2020-07-16: 10 mg via INTRAVENOUS
  Filled 2020-07-16: qty 1

## 2020-07-16 MED ORDER — DEXTROSE 5 % IV SOLN
Freq: Once | INTRAVENOUS | Status: AC
  Filled 2020-07-16: qty 250

## 2020-07-16 MED ORDER — LEUCOVORIN CALCIUM INJECTION 350 MG
400.0000 mg/m2 | Freq: Once | INTRAVENOUS | Status: AC
  Administered 2020-07-16: 644 mg via INTRAVENOUS
  Filled 2020-07-16: qty 32.2

## 2020-07-16 MED ORDER — OXALIPLATIN CHEMO INJECTION 100 MG/20ML
85.0000 mg/m2 | Freq: Once | INTRAVENOUS | Status: AC
  Administered 2020-07-16: 135 mg via INTRAVENOUS
  Filled 2020-07-16: qty 27

## 2020-07-16 MED ORDER — PALONOSETRON HCL INJECTION 0.25 MG/5ML
0.2500 mg | Freq: Once | INTRAVENOUS | Status: AC
Start: 2020-07-16 — End: 2020-07-16
  Administered 2020-07-16: 0.25 mg via INTRAVENOUS
  Filled 2020-07-16: qty 5

## 2020-07-16 NOTE — Telephone Encounter (Signed)
Called to discuss $0 out of pocket estimate for genetic testing. Unable to leave message because voicemail was full.

## 2020-07-16 NOTE — Patient Instructions (Signed)
Kirsten [name]    Discharge Instructions:  Thank you for choosing [facility] to provide your oncology and hematology care.   If you have a lab appointment with the [facility], please go directly to the [facility] and check in at the registration area.   Wear comfortable clothing and clothing appropriate for easy access to any Portacath or PICC line.   We strive to give you quality time with your provider. You may need to reschedule your appointment if you arrive late (15 or more minutes).  Arriving late affects you and other patients whose appointments are after yours.  Also, if you miss three or more appointments without notifying the office, you may be dismissed from the clinic at the provider's discretion.      For prescription refill requests, have your pharmacy contact our office and allow 72 hours for refills to be completed.    Today you received the following chemotherapy and/or immunotherapy agents Oxaliplatin (ELOXATIN), Leucovorin & Flourouracil (ADRUCIL).    To help prevent nausea and vomiting after your treatment, we encourage you to take your nausea medication as directed.  BELOW ARE SYMPTOMS THAT SHOULD BE REPORTED IMMEDIATELY: . *FEVER GREATER THAN 100.4 F (38 C) OR HIGHER . *CHILLS OR SWEATING . *NAUSEA AND VOMITING THAT IS NOT CONTROLLED WITH YOUR NAUSEA MEDICATION . *UNUSUAL SHORTNESS OF BREATH . *UNUSUAL BRUISING OR BLEEDING . *URINARY PROBLEMS (pain or burning when urinating, or frequent urination) . *BOWEL PROBLEMS (unusual diarrhea, constipation, pain near the anus) . TENDERNESS IN MOUTH AND THROAT WITH OR WITHOUT PRESENCE OF ULCERS (sore throat, sores in mouth, or a toothache) . UNUSUAL RASH, SWELLING OR PAIN  . UNUSUAL VAGINAL DISCHARGE OR ITCHING   Items with * indicate a potential emergency and should be followed up as soon as possible or go to the Emergency Department if any problems should occur.  Please show  the CHEMOTHERAPY ALERT CARD or IMMUNOTHERAPY ALERT CARD at check-in to the Emergency Department and triage nurse.  Should you have questions after your visit or need to cancel or reschedule your appointment, please contact [facility]  Dept: [phone]  and follow the prompts.  Office hours are 8:00 a.m. to 4:30 p.m. Monday - Friday. Please note that voicemails left after 4:00 p.m. may not be returned until the following business day.  We are closed weekends and major holidays. You have access to a nurse at all times for urgent questions. Please call the main number to the clinic Dept: [phone] and follow the prompts.   For any non-urgent questions, you may also contact your provider using MyChart. We now offer e-Visits for anyone 11 and older to request care online for non-urgent symptoms. For details visit [email or web address].   Also download the MyChart app! Go to the app store, search "MyChart", open the app, select [facility], and log in with your MyChart username and password.  Due to Covid, a mask is required upon entering the hospital/clinic. If you do not have a mask, one will be given to you upon arrival. For doctor visits, patients may have 1 support person aged 91 or older with them. For treatment visits, patients cannot have anyone with them due to current Covid guidelines and our immunocompromised population.   Oxaliplatin Injection What is this medicine? OXALIPLATIN (ox AL i PLA tin) is a chemotherapy drug. It targets fast dividing cells, like cancer cells, and causes these cells to die. This medicine is  used to treat cancers of the colon and rectum, and many other cancers. This medicine may be used for other purposes; ask your health care provider or pharmacist if you have questions. COMMON BRAND NAME(S): Eloxatin What should I tell my health care provider before I take this medicine? They need to know if you have any of these  conditions:  heart disease  history of irregular heartbeat  liver disease  low blood counts, like white cells, platelets, or red blood cells  lung or breathing disease, like asthma  take medicines that treat or prevent blood clots  tingling of the fingers or toes, or other nerve disorder  an unusual or allergic reaction to oxaliplatin, other chemotherapy, other medicines, foods, dyes, or preservatives  pregnant or trying to get pregnant  breast-feeding How should I use this medicine? This drug is given as an infusion into a vein. It is administered in a hospital or clinic by a specially trained health care professional. Talk to your pediatrician regarding the use of this medicine in children. Special care may be needed. Overdosage: If you think you have taken too much of this medicine contact a poison control center or emergency room at once. NOTE: This medicine is only for you. Do not share this medicine with others. What if I miss a dose? It is important not to miss a dose. Call your doctor or health care professional if you are unable to keep an appointment. What may interact with this medicine? Do not take this medicine with any of the following medications:  cisapride  dronedarone  pimozide  thioridazine This medicine may also interact with the following medications:  aspirin and aspirin-like medicines  certain medicines that treat or prevent blood clots like warfarin, apixaban, dabigatran, and rivaroxaban  cisplatin  cyclosporine  diuretics  medicines for infection like acyclovir, adefovir, amphotericin B, bacitracin, cidofovir, foscarnet, ganciclovir, gentamicin, pentamidine, vancomycin  NSAIDs, medicines for pain and inflammation, like ibuprofen or naproxen  other medicines that prolong the QT interval (an abnormal heart rhythm)  pamidronate  zoledronic acid This list may not describe all possible interactions. Give your health care provider a list  of all the medicines, herbs, non-prescription drugs, or dietary supplements you use. Also tell them if you smoke, drink alcohol, or use illegal drugs. Some items may interact with your medicine. What should I watch for while using this medicine? Your condition will be monitored carefully while you are receiving this medicine. You may need blood work done while you are taking this medicine. This medicine may make you feel generally unwell. This is not uncommon as chemotherapy can affect healthy cells as well as cancer cells. Report any side effects. Continue your course of treatment even though you feel ill unless your healthcare professional tells you to stop. This medicine can make you more sensitive to cold. Do not drink cold drinks or use ice. Cover exposed skin before coming in contact with cold temperatures or cold objects. When out in cold weather wear warm clothing and cover your mouth and nose to warm the air that goes into your lungs. Tell your doctor if you get sensitive to the cold. Do not become pregnant while taking this medicine or for 9 months after stopping it. Women should inform their health care professional if they wish to become pregnant or think they might be pregnant. Men should not father a child while taking this medicine and for 6 months after stopping it. There is potential for serious side effects to  an unborn child. Talk to your health care professional for more information. Do not breast-feed a child while taking this medicine or for 3 months after stopping it. This medicine has caused ovarian failure in some women. This medicine may make it more difficult to get pregnant. Talk to your health care professional if you are concerned about your fertility. This medicine has caused decreased sperm counts in some men. This may make it more difficult to father a child. Talk to your health care professional if you are concerned about your fertility. This medicine may increase your  risk of getting an infection. Call your health care professional for advice if you get a fever, chills, or sore throat, or other symptoms of a cold or flu. Do not treat yourself. Try to avoid being around people who are sick. Avoid taking medicines that contain aspirin, acetaminophen, ibuprofen, naproxen, or ketoprofen unless instructed by your health care professional. These medicines may hide a fever. Be careful brushing or flossing your teeth or using a toothpick because you may get an infection or bleed more easily. If you have any dental work done, tell your dentist you are receiving this medicine. What side effects may I notice from receiving this medicine? Side effects that you should report to your doctor or health care professional as soon as possible:  allergic reactions like skin rash, itching or hives, swelling of the face, lips, or tongue  breathing problems  cough  low blood counts - this medicine may decrease the number of white blood cells, red blood cells, and platelets. You may be at increased risk for infections and bleeding  nausea, vomiting  pain, redness, or irritation at site where injected  pain, tingling, numbness in the hands or feet  signs and symptoms of bleeding such as bloody or black, tarry stools; red or dark brown urine; spitting up blood or brown material that looks like coffee grounds; red spots on the skin; unusual bruising or bleeding from the eyes, gums, or nose  signs and symptoms of a dangerous change in heartbeat or heart rhythm like chest pain; dizziness; fast, irregular heartbeat; palpitations; feeling faint or lightheaded; falls  signs and symptoms of infection like fever; chills; cough; sore throat; pain or trouble passing urine  signs and symptoms of liver injury like dark yellow or brown urine; general ill feeling or flu-like symptoms; light-colored stools; loss of appetite; nausea; right upper belly pain; unusually weak or tired; yellowing of  the eyes or skin  signs and symptoms of low red blood cells or anemia such as unusually weak or tired; feeling faint or lightheaded; falls  signs and symptoms of muscle injury like dark urine; trouble passing urine or change in the amount of urine; unusually weak or tired; muscle pain; back pain Side effects that usually do not require medical attention (report to your doctor or health care professional if they continue or are bothersome):  changes in taste  diarrhea  gas  hair loss  loss of appetite  mouth sores This list may not describe all possible side effects. Call your doctor for medical advice about side effects. You may report side effects to FDA at 1-800-FDA-1088. Where should I keep my medicine? This drug is given in a hospital or clinic and will not be stored at home. NOTE: This sheet is a summary. It may not cover all possible information. If you have questions about this medicine, talk to your doctor, pharmacist, or health care provider.  2021 Elsevier/Gold Standard (  2018-07-06 12:20:35)  Leucovorin injection What is this medicine? LEUCOVORIN (loo koe VOR in) is used to prevent or treat the harmful effects of some medicines. This medicine is used to treat anemia caused by a low amount of folic acid in the body. It is also used with 5-fluorouracil (5-FU) to treat colon cancer. This medicine may be used for other purposes; ask your health care provider or pharmacist if you have questions. What should I tell my health care provider before I take this medicine? They need to know if you have any of these conditions:  anemia from low levels of vitamin B-12 in the blood  an unusual or allergic reaction to leucovorin, folic acid, other medicines, foods, dyes, or preservatives  pregnant or trying to get pregnant  breast-feeding How should I use this medicine? This medicine is for injection into a muscle or into a vein. It is given by a health care professional in a  hospital or clinic setting. Talk to your pediatrician regarding the use of this medicine in children. Special care may be needed. Overdosage: If you think you have taken too much of this medicine contact a poison control center or emergency room at once. NOTE: This medicine is only for you. Do not share this medicine with others. What if I miss a dose? This does not apply. What may interact with this medicine?  capecitabine  fluorouracil  phenobarbital  phenytoin  primidone  trimethoprim-sulfamethoxazole This list may not describe all possible interactions. Give your health care provider a list of all the medicines, herbs, non-prescription drugs, or dietary supplements you use. Also tell them if you smoke, drink alcohol, or use illegal drugs. Some items may interact with your medicine. What should I watch for while using this medicine? Your condition will be monitored carefully while you are receiving this medicine. This medicine may increase the side effects of 5-fluorouracil, 5-FU. Tell your doctor or health care professional if you have diarrhea or mouth sores that do not get better or that get worse. What side effects may I notice from receiving this medicine? Side effects that you should report to your doctor or health care professional as soon as possible:  allergic reactions like skin rash, itching or hives, swelling of the face, lips, or tongue  breathing problems  fever, infection  mouth sores  unusual bleeding or bruising  unusually weak or tired Side effects that usually do not require medical attention (report to your doctor or health care professional if they continue or are bothersome):  constipation or diarrhea  loss of appetite  nausea, vomiting This list may not describe all possible side effects. Call your doctor for medical advice about side effects. You may report side effects to FDA at 1-800-FDA-1088. Where should I keep my medicine? This drug is  given in a hospital or clinic and will not be stored at home. NOTE: This sheet is a summary. It may not cover all possible information. If you have questions about this medicine, talk to your doctor, pharmacist, or health care provider.  2021 Elsevier/Gold Standard (2007-08-23 16:50:29)  Fluorouracil, 5-FU injection What is this medicine? FLUOROURACIL, 5-FU (flure oh YOOR a sil) is a chemotherapy drug. It slows the growth of cancer cells. This medicine is used to treat many types of cancer like breast cancer, colon or rectal cancer, pancreatic cancer, and stomach cancer. This medicine may be used for other purposes; ask your health care provider or pharmacist if you have questions. COMMON BRAND NAME(S): Adrucil  What should I tell my health care provider before I take this medicine? They need to know if you have any of these conditions:  blood disorders  dihydropyrimidine dehydrogenase (DPD) deficiency  infection (especially a virus infection such as chickenpox, cold sores, or herpes)  kidney disease  liver disease  malnourished, poor nutrition  recent or ongoing radiation therapy  an unusual or allergic reaction to fluorouracil, other chemotherapy, other medicines, foods, dyes, or preservatives  pregnant or trying to get pregnant  breast-feeding How should I use this medicine? This drug is given as an infusion or injection into a vein. It is administered in a hospital or clinic by a specially trained health care professional. Talk to your pediatrician regarding the use of this medicine in children. Special care may be needed. Overdosage: If you think you have taken too much of this medicine contact a poison control center or emergency room at once. NOTE: This medicine is only for you. Do not share this medicine with others. What if I miss a dose? It is important not to miss your dose. Call your doctor or health care professional if you are unable to keep an appointment. What  may interact with this medicine? Do not take this medicine with any of the following medications:  live virus vaccines This medicine may also interact with the following medications:  medicines that treat or prevent blood clots like warfarin, enoxaparin, and dalteparin This list may not describe all possible interactions. Give your health care provider a list of all the medicines, herbs, non-prescription drugs, or dietary supplements you use. Also tell them if you smoke, drink alcohol, or use illegal drugs. Some items may interact with your medicine. What should I watch for while using this medicine? Visit your doctor for checks on your progress. This drug may make you feel generally unwell. This is not uncommon, as chemotherapy can affect healthy cells as well as cancer cells. Report any side effects. Continue your course of treatment even though you feel ill unless your doctor tells you to stop. In some cases, you may be given additional medicines to help with side effects. Follow all directions for their use. Call your doctor or health care professional for advice if you get a fever, chills or sore throat, or other symptoms of a cold or flu. Do not treat yourself. This drug decreases your body's ability to fight infections. Try to avoid being around people who are sick. This medicine may increase your risk to bruise or bleed. Call your doctor or health care professional if you notice any unusual bleeding. Be careful brushing and flossing your teeth or using a toothpick because you may get an infection or bleed more easily. If you have any dental work done, tell your dentist you are receiving this medicine. Avoid taking products that contain aspirin, acetaminophen, ibuprofen, naproxen, or ketoprofen unless instructed by your doctor. These medicines may hide a fever. Do not become pregnant while taking this medicine. Women should inform their doctor if they wish to become pregnant or think they might  be pregnant. There is a potential for serious side effects to an unborn child. Talk to your health care professional or pharmacist for more information. Do not breast-feed an infant while taking this medicine. Men should inform their doctor if they wish to father a child. This medicine may lower sperm counts. Do not treat diarrhea with over the counter products. Contact your doctor if you have diarrhea that lasts more than 2 days or  if it is severe and watery. This medicine can make you more sensitive to the sun. Keep out of the sun. If you cannot avoid being in the sun, wear protective clothing and use sunscreen. Do not use sun lamps or tanning beds/booths. What side effects may I notice from receiving this medicine? Side effects that you should report to your doctor or health care professional as soon as possible:  allergic reactions like skin rash, itching or hives, swelling of the face, lips, or tongue  low blood counts - this medicine may decrease the number of white blood cells, red blood cells and platelets. You may be at increased risk for infections and bleeding.  signs of infection - fever or chills, cough, sore throat, pain or difficulty passing urine  signs of decreased platelets or bleeding - bruising, pinpoint red spots on the skin, black, tarry stools, blood in the urine  signs of decreased red blood cells - unusually weak or tired, fainting spells, lightheadedness  breathing problems  changes in vision  chest pain  mouth sores  nausea and vomiting  pain, swelling, redness at site where injected  pain, tingling, numbness in the hands or feet  redness, swelling, or sores on hands or feet  stomach pain  unusual bleeding Side effects that usually do not require medical attention (report to your doctor or health care professional if they continue or are bothersome):  changes in finger or toe nails  diarrhea  dry or itchy skin  hair loss  headache  loss of  appetite  sensitivity of eyes to the light  stomach upset  unusually teary eyes This list may not describe all possible side effects. Call your doctor for medical advice about side effects. You may report side effects to FDA at 1-800-FDA-1088. Where should I keep my medicine? This drug is given in a hospital or clinic and will not be stored at home. NOTE: This sheet is a summary. It may not cover all possible information. If you have questions about this medicine, talk to your doctor, pharmacist, or health care provider.  2021 Elsevier/Gold Standard (2019-01-17 15:00:03)

## 2020-07-17 ENCOUNTER — Encounter: Payer: Self-pay | Admitting: General Practice

## 2020-07-17 ENCOUNTER — Encounter: Payer: 59 | Admitting: Dietician

## 2020-07-17 NOTE — Progress Notes (Signed)
[facility] CSW Progress Notes  Proof of submission for Social Security disability benefit received from [facility].  Patient is to follow up w [facility] with any questions/concerns about her application for disability benefits.  Edwyna Shell, LCSW Clinical Social Worker Phone:  [phone]

## 2020-07-18 ENCOUNTER — Other Ambulatory Visit: Payer: Self-pay | Admitting: Nurse Practitioner

## 2020-07-18 ENCOUNTER — Other Ambulatory Visit: Payer: Self-pay

## 2020-07-18 ENCOUNTER — Inpatient Hospital Stay: Payer: 59

## 2020-07-18 VITALS — BP 142/87 | HR 84 | Temp 98.7°F | Resp 18

## 2020-07-18 DIAGNOSIS — C252 Malignant neoplasm of tail of pancreas: Secondary | ICD-10-CM

## 2020-07-18 DIAGNOSIS — C787 Secondary malignant neoplasm of liver and intrahepatic bile duct: Secondary | ICD-10-CM

## 2020-07-18 DIAGNOSIS — Z5111 Encounter for antineoplastic chemotherapy: Secondary | ICD-10-CM | POA: Diagnosis not present

## 2020-07-18 DIAGNOSIS — C259 Malignant neoplasm of pancreas, unspecified: Secondary | ICD-10-CM

## 2020-07-18 MED ORDER — HEPARIN SOD (PORK) LOCK FLUSH 100 UNIT/ML IV SOLN
500.0000 [IU] | Freq: Once | INTRAVENOUS | Status: AC | PRN
  Administered 2020-07-18: 500 [IU]
  Filled 2020-07-18: qty 5

## 2020-07-18 MED ORDER — SODIUM CHLORIDE 0.9% FLUSH
10.0000 mL | INTRAVENOUS | Status: DC | PRN
  Administered 2020-07-18: 10 mL
  Filled 2020-07-18: qty 10

## 2020-07-18 MED ORDER — HYDROCODONE-ACETAMINOPHEN 10-325 MG PO TABS: 1.0000 | ORAL_TABLET | Freq: Four times a day (QID) | ORAL | 0 refills | Status: DC | PRN

## 2020-07-18 MED ORDER — FENTANYL 12 MCG/HR TD PT72: 1.0000 | MEDICATED_PATCH | TRANSDERMAL | 0 refills | Status: DC

## 2020-07-18 NOTE — Progress Notes (Signed)
Patient presents today for pump removal. Patient is asking for a refill on her fentanyl patches and hydrocodone. Patient has used her last fentanyl patch.

## 2020-07-18 NOTE — Progress Notes (Signed)
Pain meds refilled by Ned Card and I called patient and let her know that they had been refilled.

## 2020-07-18 NOTE — Patient Instructions (Signed)
[facility]  Discharge Instructions: Thank you for choosing [facility] to provide your oncology and hematology care.   If you have a lab appointment with the [facility], please go directly to the [facility] and check in at the registration area.   Wear comfortable clothing and clothing appropriate for easy access to any Portacath or PICC line.   We strive to give you quality time with your provider. You may need to reschedule your appointment if you arrive late (15 or more minutes).  Arriving late affects you and other patients whose appointments are after yours.  Also, if you miss three or more appointments without notifying the office, you may be dismissed from the clinic at the provider's discretion.      For prescription refill requests, have your pharmacy contact our office and allow 72 hours for refills to be completed.    Today you received the following chemotherapy: 28fu   To help prevent nausea and vomiting after your treatment, we encourage you to take your nausea medication as directed.  BELOW ARE SYMPTOMS THAT SHOULD BE REPORTED IMMEDIATELY: . *FEVER GREATER THAN 100.4 F (38 C) OR HIGHER . *CHILLS OR SWEATING . *NAUSEA AND VOMITING THAT IS NOT CONTROLLED WITH YOUR NAUSEA MEDICATION . *UNUSUAL SHORTNESS OF BREATH . *UNUSUAL BRUISING OR BLEEDING . *URINARY PROBLEMS (pain or burning when urinating, or frequent urination) . *BOWEL PROBLEMS (unusual diarrhea, constipation, pain near the anus) . TENDERNESS IN MOUTH AND THROAT WITH OR WITHOUT PRESENCE OF ULCERS (sore throat, sores in mouth, or a toothache) . UNUSUAL RASH, SWELLING OR PAIN  . UNUSUAL VAGINAL DISCHARGE OR ITCHING   Items with * indicate a potential emergency and should be followed up as soon as possible or go to the Emergency Department if any problems should occur.  Please show the CHEMOTHERAPY ALERT CARD or IMMUNOTHERAPY ALERT CARD at check-in to the Emergency  Department and triage nurse.  Should you have questions after your visit or need to cancel or reschedule your appointment, please contact [facility]  Dept: [phone]  and follow the prompts.  Office hours are 8:00 a.m. to 4:30 p.m. Monday - Friday. Please note that voicemails left after 4:00 p.m. may not be returned until the following business day.  We are closed weekends and major holidays. You have access to a nurse at all times for urgent questions. Please call the main number to the clinic Dept: [phone] and follow the prompts.   For any non-urgent questions, you may also contact your provider using MyChart. We now offer e-Visits for anyone 21 and older to request care online for non-urgent symptoms. For details visit [email or web address].   Also download the MyChart app! Go to the app store, search "MyChart", open the app, select [facility], and log in with your MyChart username and password.  Due to Covid, a mask is required upon entering the hospital/clinic. If you do not have a mask, one will be given to you upon arrival. For doctor visits, patients may have 1 support person aged 4 or older with them. For treatment visits, patients cannot have anyone with them due to current Covid guidelines and our immunocompromised population.    Fluorouracil, 5-FU injection What is this medicine? FLUOROURACIL, 5-FU (flure oh YOOR a sil) is a chemotherapy drug. It slows the growth of cancer cells. This medicine is used to treat many types of cancer like breast cancer, colon or rectal cancer, pancreatic cancer,  and stomach cancer. This medicine may be used for other purposes; ask your health care provider or pharmacist if you have questions. COMMON BRAND NAME(S): Adrucil What should I tell my health care provider before I take this medicine? They need to know if you have any of these conditions:  blood disorders  dihydropyrimidine dehydrogenase (DPD)  deficiency  infection (especially a virus infection such as chickenpox, cold sores, or herpes)  kidney disease  liver disease  malnourished, poor nutrition  recent or ongoing radiation therapy  an unusual or allergic reaction to fluorouracil, other chemotherapy, other medicines, foods, dyes, or preservatives  pregnant or trying to get pregnant  breast-feeding How should I use this medicine? This drug is given as an infusion or injection into a vein. It is administered in a hospital or clinic by a specially trained health care professional. Talk to your pediatrician regarding the use of this medicine in children. Special care may be needed. Overdosage: If you think you have taken too much of this medicine contact a poison control center or emergency room at once. NOTE: This medicine is only for you. Do not share this medicine with others. What if I miss a dose? It is important not to miss your dose. Call your doctor or health care professional if you are unable to keep an appointment. What may interact with this medicine? Do not take this medicine with any of the following medications:  live virus vaccines This medicine may also interact with the following medications:  medicines that treat or prevent blood clots like warfarin, enoxaparin, and dalteparin This list may not describe all possible interactions. Give your health care provider a list of all the medicines, herbs, non-prescription drugs, or dietary supplements you use. Also tell them if you smoke, drink alcohol, or use illegal drugs. Some items may interact with your medicine. What should I watch for while using this medicine? Visit your doctor for checks on your progress. This drug may make you feel generally unwell. This is not uncommon, as chemotherapy can affect healthy cells as well as cancer cells. Report any side effects. Continue your course of treatment even though you feel ill unless your doctor tells you to  stop. In some cases, you may be given additional medicines to help with side effects. Follow all directions for their use. Call your doctor or health care professional for advice if you get a fever, chills or sore throat, or other symptoms of a cold or flu. Do not treat yourself. This drug decreases your body's ability to fight infections. Try to avoid being around people who are sick. This medicine may increase your risk to bruise or bleed. Call your doctor or health care professional if you notice any unusual bleeding. Be careful brushing and flossing your teeth or using a toothpick because you may get an infection or bleed more easily. If you have any dental work done, tell your dentist you are receiving this medicine. Avoid taking products that contain aspirin, acetaminophen, ibuprofen, naproxen, or ketoprofen unless instructed by your doctor. These medicines may hide a fever. Do not become pregnant while taking this medicine. Women should inform their doctor if they wish to become pregnant or think they might be pregnant. There is a potential for serious side effects to an unborn child. Talk to your health care professional or pharmacist for more information. Do not breast-feed an infant while taking this medicine. Men should inform their doctor if they wish to father a child. This medicine may  lower sperm counts. Do not treat diarrhea with over the counter products. Contact your doctor if you have diarrhea that lasts more than 2 days or if it is severe and watery. This medicine can make you more sensitive to the sun. Keep out of the sun. If you cannot avoid being in the sun, wear protective clothing and use sunscreen. Do not use sun lamps or tanning beds/booths. What side effects may I notice from receiving this medicine? Side effects that you should report to your doctor or health care professional as soon as possible:  allergic reactions like skin rash, itching or hives, swelling of the face,  lips, or tongue  low blood counts - this medicine may decrease the number of white blood cells, red blood cells and platelets. You may be at increased risk for infections and bleeding.  signs of infection - fever or chills, cough, sore throat, pain or difficulty passing urine  signs of decreased platelets or bleeding - bruising, pinpoint red spots on the skin, black, tarry stools, blood in the urine  signs of decreased red blood cells - unusually weak or tired, fainting spells, lightheadedness  breathing problems  changes in vision  chest pain  mouth sores  nausea and vomiting  pain, swelling, redness at site where injected  pain, tingling, numbness in the hands or feet  redness, swelling, or sores on hands or feet  stomach pain  unusual bleeding Side effects that usually do not require medical attention (report to your doctor or health care professional if they continue or are bothersome):  changes in finger or toe nails  diarrhea  dry or itchy skin  hair loss  headache  loss of appetite  sensitivity of eyes to the light  stomach upset  unusually teary eyes This list may not describe all possible side effects. Call your doctor for medical advice about side effects. You may report side effects to FDA at 1-800-FDA-1088. Where should I keep my medicine? This drug is given in a hospital or clinic and will not be stored at home. NOTE: This sheet is a summary. It may not cover all possible information. If you have questions about this medicine, talk to your doctor, pharmacist, or health care provider.  2021 [employer]vier/Gold Standard (2019-01-17 15:00:03)

## 2020-07-23 ENCOUNTER — Telehealth: Payer: Self-pay

## 2020-07-23 ENCOUNTER — Encounter: Payer: Self-pay | Admitting: *Deleted

## 2020-07-23 NOTE — Progress Notes (Addendum)
Faxed completed PA form to [facility] for Duragesic patch. Included last office note and CT report. Fax [phone] Case # [id number]

## 2020-07-23 NOTE — Telephone Encounter (Signed)
Return call to Pt. Stating she can't pick up her fentanyl patches because the pharmacy stated she needed to contact the office. TC to [facility] which stated Pt would need prior authorization for patches. [id number] called prior authorization in process. Pt informed prior authorization in process and should take about 24 hours. Pt verbalized understanding. No further problems or concerns noted.

## 2020-07-26 ENCOUNTER — Other Ambulatory Visit: Payer: 59 | Admitting: Licensed Clinical Social Worker

## 2020-07-26 ENCOUNTER — Encounter: Payer: 59 | Admitting: Dietician

## 2020-07-28 ENCOUNTER — Other Ambulatory Visit: Payer: Self-pay | Admitting: Oncology

## 2020-07-30 ENCOUNTER — Encounter: Payer: Self-pay | Admitting: Nurse Practitioner

## 2020-07-30 ENCOUNTER — Other Ambulatory Visit: Payer: Self-pay

## 2020-07-30 ENCOUNTER — Inpatient Hospital Stay: Payer: 59

## 2020-07-30 ENCOUNTER — Inpatient Hospital Stay: Payer: 59 | Admitting: Nurse Practitioner

## 2020-07-30 VITALS — BP 158/94 | HR 90 | Temp 98.2°F | Resp 18 | Ht 63.0 in | Wt 129.0 lb

## 2020-07-30 DIAGNOSIS — C787 Secondary malignant neoplasm of liver and intrahepatic bile duct: Secondary | ICD-10-CM | POA: Diagnosis not present

## 2020-07-30 DIAGNOSIS — Z5111 Encounter for antineoplastic chemotherapy: Secondary | ICD-10-CM | POA: Diagnosis not present

## 2020-07-30 DIAGNOSIS — C259 Malignant neoplasm of pancreas, unspecified: Secondary | ICD-10-CM

## 2020-07-30 DIAGNOSIS — C252 Malignant neoplasm of tail of pancreas: Secondary | ICD-10-CM

## 2020-07-30 LAB — CMP (CANCER CENTER ONLY)
ALT: 23 U/L (ref 0–44)
AST: 29 U/L (ref 15–41)
Albumin: 3.6 g/dL (ref 3.5–5.0)
Alkaline Phosphatase: 521 U/L — ABNORMAL HIGH (ref 38–126)
Anion gap: 9 (ref 5–15)
BUN: 6 mg/dL — ABNORMAL LOW (ref 8–23)
CO2: 27 mmol/L (ref 22–32)
Calcium: 9.5 mg/dL (ref 8.9–10.3)
Chloride: 99 mmol/L (ref 98–111)
Creatinine: 0.76 mg/dL (ref 0.44–1.00)
GFR, Estimated: >60 mL/min (ref >60)
Glucose, Bld: 86 mg/dL (ref 70–99)
Potassium: 4 mmol/L (ref 3.5–5.1)
Sodium: 135 mmol/L (ref 135–145)
Total Bilirubin: 0.5 mg/dL (ref 0.3–1.2)
Total Protein: 7.3 g/dL (ref 6.5–8.1)

## 2020-07-30 LAB — CBC WITH DIFFERENTIAL (CANCER CENTER ONLY)
Abs Immature Granulocytes: 0.01 10*3/uL (ref 0.00–0.07)
Basophils Absolute: 0.1 10*3/uL (ref 0.0–0.1)
Basophils Relative: 1 %
Eosinophils Absolute: 0 10*3/uL (ref 0.0–0.5)
Eosinophils Relative: 1 %
HCT: 34.1 % — ABNORMAL LOW (ref 36.0–46.0)
Hemoglobin: 10.7 g/dL — ABNORMAL LOW (ref 12.0–15.0)
Immature Granulocytes: 0 %
Lymphocytes Relative: 31 %
Lymphs Abs: 2.3 10*3/uL (ref 0.7–4.0)
MCH: 27.2 pg (ref 26.0–34.0)
MCHC: 31.4 g/dL (ref 30.0–36.0)
MCV: 86.8 fL (ref 80.0–100.0)
Monocytes Absolute: 1.1 10*3/uL — ABNORMAL HIGH (ref 0.1–1.0)
Monocytes Relative: 15 %
Neutro Abs: 3.9 10*3/uL (ref 1.7–7.7)
Neutrophils Relative %: 52 %
Platelet Count: 269 10*3/uL (ref 150–400)
RBC: 3.93 MIL/uL (ref 3.87–5.11)
RDW: 13.8 % (ref 11.5–15.5)
WBC Count: 7.4 10*3/uL (ref 4.0–10.5)
nRBC: 0 % (ref 0.0–0.2)

## 2020-07-30 NOTE — Progress Notes (Signed)
  [facility] OFFICE PROGRESS NOTE   Diagnosis: Pancreas cancer  INTERVAL HISTORY:   Ms. Roseman returns as scheduled.  She completed cycle 2 FOLFOX 07/16/2020.  She did not have nausea but felt as if the chemotherapy was infusing into her stomach and causing a "swishing" noise.  No mouth sores.  No diarrhea.  No hand or foot pain or redness.  She avoids cold for the first 7 days and is then able to resume without difficulty.  She reports tolerating cycle 2 less well than cycle 1 specifically citing appetite.  Intake was less and sporadic the week off of treatment following cycle 2.  Objective:  Vital signs in last 24 hours:  Blood pressure (!) 158/94, pulse 90, temperature 98.2 F (36.8 C), temperature source Oral, resp. rate 18, height 5\' 3"  (1.6 m), weight 129 lb (58.5 kg), SpO2 100 %.    HEENT: No thrush or ulcers. Resp: Lungs clear bilaterally. Cardio: Regular rate and rhythm. GI: Fullness in the mid and right upper abdomen.  No significant tenderness. Vascular: No leg edema.  Skin: Palms without erythema. Port-A-Cath without erythema.   Lab Results:  Lab Results  Component Value Date   WBC 7.4 07/30/2020   HGB 10.7 (L) 07/30/2020   HCT 34.1 (L) 07/30/2020   MCV 86.8 07/30/2020   PLT 269 07/30/2020   NEUTROABS 3.9 07/30/2020    Imaging:  No results found.  Medications: I have reviewed the patient's current medications.  Assessment/Plan: 1. Metastatic pancreas cancer  Right upper quadrant ultrasound 06/17/2020-multiple hypoechoic liver lesions  CT abdomen/pelvis 06/17/2020-pancreas tail mass, multiple liver metastases, enlarged periaortic nodes, right ovary metastasis?, Nodularity at the left paracolic gutter and posterior/inferior stomach suggesting peritoneal tumor spread  CT chest 06/17/2020-scattered small pulmonary nodules consistent with metastatic disease  Ultrasound-guided biopsy of a left liver mass 06/18/2020-adenocarcinoma, morphology  compatible with metastatic pancreatic adenocarcinoma  Cycle 1 FOLFOX 07/01/2020  Cycle 2 FOLFOX 07/16/2020  Cycle 3 FOLFOX 07/31/2020 2.Pain secondary to #1 3.Weight loss 4.Family history of pancreas cancer  Disposition: Ms. Castleman appears stable.  She has completed 2 cycles of FOLFOX.  We discussed adding irinotecan with cycle 3 but she reports poor tolerance of cycle 2 FOLFOX specifically related to her appetite.  We decided not to add the irinotecan.  Plan to proceed with cycle 3 FOLFOX as scheduled 07/31/2020.  We reviewed the CBC and chemistry panel from today.  Labs adequate to proceed with cycle 3 FOLFOX tomorrow as scheduled.  She will return for lab, follow-up, cycle 4 FOLFOX/FOLFOXIRI in 2 weeks.  She will contact the office in the interim with any problems.  Plan reviewed with Dr. Benay Spice.  Ned Card ANP/GNP-BC   07/30/2020  1:36 PM

## 2020-07-31 ENCOUNTER — Other Ambulatory Visit: Payer: 59

## 2020-07-31 ENCOUNTER — Ambulatory Visit: Payer: 59

## 2020-07-31 ENCOUNTER — Inpatient Hospital Stay: Payer: 59 | Attending: Oncology

## 2020-07-31 VITALS — BP 138/87 | HR 91 | Temp 98.7°F | Resp 18

## 2020-07-31 DIAGNOSIS — G893 Neoplasm related pain (acute) (chronic): Secondary | ICD-10-CM | POA: Diagnosis not present

## 2020-07-31 DIAGNOSIS — C252 Malignant neoplasm of tail of pancreas: Secondary | ICD-10-CM | POA: Diagnosis present

## 2020-07-31 DIAGNOSIS — Z5111 Encounter for antineoplastic chemotherapy: Secondary | ICD-10-CM | POA: Diagnosis not present

## 2020-07-31 DIAGNOSIS — C787 Secondary malignant neoplasm of liver and intrahepatic bile duct: Secondary | ICD-10-CM | POA: Insufficient documentation

## 2020-07-31 DIAGNOSIS — C7800 Secondary malignant neoplasm of unspecified lung: Secondary | ICD-10-CM | POA: Insufficient documentation

## 2020-07-31 DIAGNOSIS — T451X5A Adverse effect of antineoplastic and immunosuppressive drugs, initial encounter: Secondary | ICD-10-CM | POA: Diagnosis not present

## 2020-07-31 DIAGNOSIS — D701 Agranulocytosis secondary to cancer chemotherapy: Secondary | ICD-10-CM | POA: Insufficient documentation

## 2020-07-31 LAB — CANCER ANTIGEN 19-9: CA 19-9: 71664 U/mL — ABNORMAL HIGH (ref 0–35)

## 2020-07-31 MED ORDER — DEXTROSE 5 % IV SOLN
Freq: Once | INTRAVENOUS | Status: AC
  Filled 2020-07-31: qty 250

## 2020-07-31 MED ORDER — PALONOSETRON HCL INJECTION 0.25 MG/5ML
0.2500 mg | Freq: Once | INTRAVENOUS | Status: AC
Start: 2020-07-31 — End: 2020-07-31
  Administered 2020-07-31: 0.25 mg via INTRAVENOUS
  Filled 2020-07-31: qty 5

## 2020-07-31 MED ORDER — LEUCOVORIN CALCIUM INJECTION 350 MG
400.0000 mg/m2 | Freq: Once | INTRAVENOUS | Status: AC
  Administered 2020-07-31: 644 mg via INTRAVENOUS
  Filled 2020-07-31: qty 32.2

## 2020-07-31 MED ORDER — SODIUM CHLORIDE 0.9 % IV SOLN
2400.0000 mg/m2 | INTRAVENOUS | Status: DC
  Administered 2020-07-31: 3850 mg via INTRAVENOUS
  Filled 2020-07-31: qty 77

## 2020-07-31 MED ORDER — SODIUM CHLORIDE 0.9 % IV SOLN
10.0000 mg | Freq: Once | INTRAVENOUS | Status: AC
  Administered 2020-07-31: 10 mg via INTRAVENOUS
  Filled 2020-07-31: qty 1

## 2020-07-31 MED ORDER — OXALIPLATIN CHEMO INJECTION 100 MG/20ML
85.0000 mg/m2 | Freq: Once | INTRAVENOUS | Status: AC
  Administered 2020-07-31: 135 mg via INTRAVENOUS
  Filled 2020-07-31: qty 27

## 2020-07-31 MED ORDER — SODIUM CHLORIDE 0.9% FLUSH
10.0000 mL | INTRAVENOUS | Status: DC | PRN
  Administered 2020-07-31: 10 mL
  Filled 2020-07-31: qty 10

## 2020-07-31 NOTE — Patient Instructions (Signed)
[facility]    Discharge Instructions:  Thank you for choosing [facility] to provide your oncology and hematology care.   If you have a lab appointment with the [facility], please go directly to the [facility] and check in at the registration area.   Wear comfortable clothing and clothing appropriate for easy access to any Portacath or PICC line.   We strive to give you quality time with your provider. You may need to reschedule your appointment if you arrive late (15 or more minutes).  Arriving late affects you and other patients whose appointments are after yours.  Also, if you miss three or more appointments without notifying the office, you may be dismissed from the clinic at the provider's discretion.      For prescription refill requests, have your pharmacy contact our office and allow 72 hours for refills to be completed.    Today you received the following chemotherapy and/or immunotherapy agents Oxaliplatin (ELOXATIN), Leucovorin & Flourouracil (ADRUCIL).    To help prevent nausea and vomiting after your treatment, we encourage you to take your nausea medication as directed.  BELOW ARE SYMPTOMS THAT SHOULD BE REPORTED IMMEDIATELY: . *FEVER GREATER THAN 100.4 F (38 C) OR HIGHER . *CHILLS OR SWEATING . *NAUSEA AND VOMITING THAT IS NOT CONTROLLED WITH YOUR NAUSEA MEDICATION . *UNUSUAL SHORTNESS OF BREATH . *UNUSUAL BRUISING OR BLEEDING . *URINARY PROBLEMS (pain or burning when urinating, or frequent urination) . *BOWEL PROBLEMS (unusual diarrhea, constipation, pain near the anus) . TENDERNESS IN MOUTH AND THROAT WITH OR WITHOUT PRESENCE OF ULCERS (sore throat, sores in mouth, or a toothache) . UNUSUAL RASH, SWELLING OR PAIN  . UNUSUAL VAGINAL DISCHARGE OR ITCHING   Items with * indicate a potential emergency and should be followed up as soon as possible or go to the Emergency Department if any problems should occur.  Please show  the CHEMOTHERAPY ALERT CARD or IMMUNOTHERAPY ALERT CARD at check-in to the Emergency Department and triage nurse.  Should you have questions after your visit or need to cancel or reschedule your appointment, please contact [facility]  Dept: [phone]  and follow the prompts.  Office hours are 8:00 a.m. to 4:30 p.m. Monday - Friday. Please note that voicemails left after 4:00 p.m. may not be returned until the following business day.  We are closed weekends and major holidays. You have access to a nurse at all times for urgent questions. Please call the main number to the clinic Dept: [phone] and follow the prompts.   For any non-urgent questions, you may also contact your provider using MyChart. We now offer e-Visits for anyone 86 and older to request care online for non-urgent symptoms. For details visit [email or web address].   Also download the MyChart app! Go to the app store, search "MyChart", open the app, select [facility], and log in with your MyChart username and password.  Due to Covid, a mask is required upon entering the hospital/clinic. If you do not have a mask, one will be given to you upon arrival. For doctor visits, patients may have 1 support person aged 45 or older with them. For treatment visits, patients cannot have anyone with them due to current Covid guidelines and our immunocompromised population.   Oxaliplatin Injection What is this medicine? OXALIPLATIN (ox AL i PLA tin) is a chemotherapy drug. It targets fast dividing cells, like cancer cells, and causes these cells to die. This medicine is  used to treat cancers of the colon and rectum, and many other cancers. This medicine may be used for other purposes; ask your health care provider or pharmacist if you have questions. COMMON BRAND NAME(S): Eloxatin What should I tell my health care provider before I take this medicine? They need to know if you have any of these  conditions:  heart disease  history of irregular heartbeat  liver disease  low blood counts, like white cells, platelets, or red blood cells  lung or breathing disease, like asthma  take medicines that treat or prevent blood clots  tingling of the fingers or toes, or other nerve disorder  an unusual or allergic reaction to oxaliplatin, other chemotherapy, other medicines, foods, dyes, or preservatives  pregnant or trying to get pregnant  breast-feeding How should I use this medicine? This drug is given as an infusion into a vein. It is administered in a hospital or clinic by a specially trained health care professional. Talk to your pediatrician regarding the use of this medicine in children. Special care may be needed. Overdosage: If you think you have taken too much of this medicine contact a poison control center or emergency room at once. NOTE: This medicine is only for you. Do not share this medicine with others. What if I miss a dose? It is important not to miss a dose. Call your doctor or health care professional if you are unable to keep an appointment. What may interact with this medicine? Do not take this medicine with any of the following medications:  cisapride  dronedarone  pimozide  thioridazine This medicine may also interact with the following medications:  aspirin and aspirin-like medicines  certain medicines that treat or prevent blood clots like warfarin, apixaban, dabigatran, and rivaroxaban  cisplatin  cyclosporine  diuretics  medicines for infection like acyclovir, adefovir, amphotericin B, bacitracin, cidofovir, foscarnet, ganciclovir, gentamicin, pentamidine, vancomycin  NSAIDs, medicines for pain and inflammation, like ibuprofen or naproxen  other medicines that prolong the QT interval (an abnormal heart rhythm)  pamidronate  zoledronic acid This list may not describe all possible interactions. Give your health care provider a list  of all the medicines, herbs, non-prescription drugs, or dietary supplements you use. Also tell them if you smoke, drink alcohol, or use illegal drugs. Some items may interact with your medicine. What should I watch for while using this medicine? Your condition will be monitored carefully while you are receiving this medicine. You may need blood work done while you are taking this medicine. This medicine may make you feel generally unwell. This is not uncommon as chemotherapy can affect healthy cells as well as cancer cells. Report any side effects. Continue your course of treatment even though you feel ill unless your healthcare professional tells you to stop. This medicine can make you more sensitive to cold. Do not drink cold drinks or use ice. Cover exposed skin before coming in contact with cold temperatures or cold objects. When out in cold weather wear warm clothing and cover your mouth and nose to warm the air that goes into your lungs. Tell your doctor if you get sensitive to the cold. Do not become pregnant while taking this medicine or for 9 months after stopping it. Women should inform their health care professional if they wish to become pregnant or think they might be pregnant. Men should not father a child while taking this medicine and for 6 months after stopping it. There is potential for serious side effects to  an unborn child. Talk to your health care professional for more information. Do not breast-feed a child while taking this medicine or for 3 months after stopping it. This medicine has caused ovarian failure in some women. This medicine may make it more difficult to get pregnant. Talk to your health care professional if you are concerned about your fertility. This medicine has caused decreased sperm counts in some men. This may make it more difficult to father a child. Talk to your health care professional if you are concerned about your fertility. This medicine may increase your  risk of getting an infection. Call your health care professional for advice if you get a fever, chills, or sore throat, or other symptoms of a cold or flu. Do not treat yourself. Try to avoid being around people who are sick. Avoid taking medicines that contain aspirin, acetaminophen, ibuprofen, naproxen, or ketoprofen unless instructed by your health care professional. These medicines may hide a fever. Be careful brushing or flossing your teeth or using a toothpick because you may get an infection or bleed more easily. If you have any dental work done, tell your dentist you are receiving this medicine. What side effects may I notice from receiving this medicine? Side effects that you should report to your doctor or health care professional as soon as possible:  allergic reactions like skin rash, itching or hives, swelling of the face, lips, or tongue  breathing problems  cough  low blood counts - this medicine may decrease the number of white blood cells, red blood cells, and platelets. You may be at increased risk for infections and bleeding  nausea, vomiting  pain, redness, or irritation at site where injected  pain, tingling, numbness in the hands or feet  signs and symptoms of bleeding such as bloody or black, tarry stools; red or dark brown urine; spitting up blood or brown material that looks like coffee grounds; red spots on the skin; unusual bruising or bleeding from the eyes, gums, or nose  signs and symptoms of a dangerous change in heartbeat or heart rhythm like chest pain; dizziness; fast, irregular heartbeat; palpitations; feeling faint or lightheaded; falls  signs and symptoms of infection like fever; chills; cough; sore throat; pain or trouble passing urine  signs and symptoms of liver injury like dark yellow or brown urine; general ill feeling or flu-like symptoms; light-colored stools; loss of appetite; nausea; right upper belly pain; unusually weak or tired; yellowing of  the eyes or skin  signs and symptoms of low red blood cells or anemia such as unusually weak or tired; feeling faint or lightheaded; falls  signs and symptoms of muscle injury like dark urine; trouble passing urine or change in the amount of urine; unusually weak or tired; muscle pain; back pain Side effects that usually do not require medical attention (report to your doctor or health care professional if they continue or are bothersome):  changes in taste  diarrhea  gas  hair loss  loss of appetite  mouth sores This list may not describe all possible side effects. Call your doctor for medical advice about side effects. You may report side effects to FDA at 1-800-FDA-1088. Where should I keep my medicine? This drug is given in a hospital or clinic and will not be stored at home. NOTE: This sheet is a summary. It may not cover all possible information. If you have questions about this medicine, talk to your doctor, pharmacist, or health care provider.  2021 Elsevier/Gold Standard (  2018-07-06 12:20:35)  Leucovorin injection What is this medicine? LEUCOVORIN (loo koe VOR in) is used to prevent or treat the harmful effects of some medicines. This medicine is used to treat anemia caused by a low amount of folic acid in the body. It is also used with 5-fluorouracil (5-FU) to treat colon cancer. This medicine may be used for other purposes; ask your health care provider or pharmacist if you have questions. What should I tell my health care provider before I take this medicine? They need to know if you have any of these conditions:  anemia from low levels of vitamin B-12 in the blood  an unusual or allergic reaction to leucovorin, folic acid, other medicines, foods, dyes, or preservatives  pregnant or trying to get pregnant  breast-feeding How should I use this medicine? This medicine is for injection into a muscle or into a vein. It is given by a health care professional in a  hospital or clinic setting. Talk to your pediatrician regarding the use of this medicine in children. Special care may be needed. Overdosage: If you think you have taken too much of this medicine contact a poison control center or emergency room at once. NOTE: This medicine is only for you. Do not share this medicine with others. What if I miss a dose? This does not apply. What may interact with this medicine?  capecitabine  fluorouracil  phenobarbital  phenytoin  primidone  trimethoprim-sulfamethoxazole This list may not describe all possible interactions. Give your health care provider a list of all the medicines, herbs, non-prescription drugs, or dietary supplements you use. Also tell them if you smoke, drink alcohol, or use illegal drugs. Some items may interact with your medicine. What should I watch for while using this medicine? Your condition will be monitored carefully while you are receiving this medicine. This medicine may increase the side effects of 5-fluorouracil, 5-FU. Tell your doctor or health care professional if you have diarrhea or mouth sores that do not get better or that get worse. What side effects may I notice from receiving this medicine? Side effects that you should report to your doctor or health care professional as soon as possible:  allergic reactions like skin rash, itching or hives, swelling of the face, lips, or tongue  breathing problems  fever, infection  mouth sores  unusual bleeding or bruising  unusually weak or tired Side effects that usually do not require medical attention (report to your doctor or health care professional if they continue or are bothersome):  constipation or diarrhea  loss of appetite  nausea, vomiting This list may not describe all possible side effects. Call your doctor for medical advice about side effects. You may report side effects to FDA at 1-800-FDA-1088. Where should I keep my medicine? This drug is  given in a hospital or clinic and will not be stored at home. NOTE: This sheet is a summary. It may not cover all possible information. If you have questions about this medicine, talk to your doctor, pharmacist, or health care provider.  2021 Elsevier/Gold Standard (2007-08-23 16:50:29)  Fluorouracil, 5-FU injection What is this medicine? FLUOROURACIL, 5-FU (flure oh YOOR a sil) is a chemotherapy drug. It slows the growth of cancer cells. This medicine is used to treat many types of cancer like breast cancer, colon or rectal cancer, pancreatic cancer, and stomach cancer. This medicine may be used for other purposes; ask your health care provider or pharmacist if you have questions. COMMON BRAND NAME(S): Adrucil  What should I tell my health care provider before I take this medicine? They need to know if you have any of these conditions:  blood disorders  dihydropyrimidine dehydrogenase (DPD) deficiency  infection (especially a virus infection such as chickenpox, cold sores, or herpes)  kidney disease  liver disease  malnourished, poor nutrition  recent or ongoing radiation therapy  an unusual or allergic reaction to fluorouracil, other chemotherapy, other medicines, foods, dyes, or preservatives  pregnant or trying to get pregnant  breast-feeding How should I use this medicine? This drug is given as an infusion or injection into a vein. It is administered in a hospital or clinic by a specially trained health care professional. Talk to your pediatrician regarding the use of this medicine in children. Special care may be needed. Overdosage: If you think you have taken too much of this medicine contact a poison control center or emergency room at once. NOTE: This medicine is only for you. Do not share this medicine with others. What if I miss a dose? It is important not to miss your dose. Call your doctor or health care professional if you are unable to keep an appointment. What  may interact with this medicine? Do not take this medicine with any of the following medications:  live virus vaccines This medicine may also interact with the following medications:  medicines that treat or prevent blood clots like warfarin, enoxaparin, and dalteparin This list may not describe all possible interactions. Give your health care provider a list of all the medicines, herbs, non-prescription drugs, or dietary supplements you use. Also tell them if you smoke, drink alcohol, or use illegal drugs. Some items may interact with your medicine. What should I watch for while using this medicine? Visit your doctor for checks on your progress. This drug may make you feel generally unwell. This is not uncommon, as chemotherapy can affect healthy cells as well as cancer cells. Report any side effects. Continue your course of treatment even though you feel ill unless your doctor tells you to stop. In some cases, you may be given additional medicines to help with side effects. Follow all directions for their use. Call your doctor or health care professional for advice if you get a fever, chills or sore throat, or other symptoms of a cold or flu. Do not treat yourself. This drug decreases your body's ability to fight infections. Try to avoid being around people who are sick. This medicine may increase your risk to bruise or bleed. Call your doctor or health care professional if you notice any unusual bleeding. Be careful brushing and flossing your teeth or using a toothpick because you may get an infection or bleed more easily. If you have any dental work done, tell your dentist you are receiving this medicine. Avoid taking products that contain aspirin, acetaminophen, ibuprofen, naproxen, or ketoprofen unless instructed by your doctor. These medicines may hide a fever. Do not become pregnant while taking this medicine. Women should inform their doctor if they wish to become pregnant or think they might  be pregnant. There is a potential for serious side effects to an unborn child. Talk to your health care professional or pharmacist for more information. Do not breast-feed an infant while taking this medicine. Men should inform their doctor if they wish to father a child. This medicine may lower sperm counts. Do not treat diarrhea with over the counter products. Contact your doctor if you have diarrhea that lasts more than 2 days or  if it is severe and watery. This medicine can make you more sensitive to the sun. Keep out of the sun. If you cannot avoid being in the sun, wear protective clothing and use sunscreen. Do not use sun lamps or tanning beds/booths. What side effects may I notice from receiving this medicine? Side effects that you should report to your doctor or health care professional as soon as possible:  allergic reactions like skin rash, itching or hives, swelling of the face, lips, or tongue  low blood counts - this medicine may decrease the number of white blood cells, red blood cells and platelets. You may be at increased risk for infections and bleeding.  signs of infection - fever or chills, cough, sore throat, pain or difficulty passing urine  signs of decreased platelets or bleeding - bruising, pinpoint red spots on the skin, black, tarry stools, blood in the urine  signs of decreased red blood cells - unusually weak or tired, fainting spells, lightheadedness  breathing problems  changes in vision  chest pain  mouth sores  nausea and vomiting  pain, swelling, redness at site where injected  pain, tingling, numbness in the hands or feet  redness, swelling, or sores on hands or feet  stomach pain  unusual bleeding Side effects that usually do not require medical attention (report to your doctor or health care professional if they continue or are bothersome):  changes in finger or toe nails  diarrhea  dry or itchy skin  hair loss  headache  loss of  appetite  sensitivity of eyes to the light  stomach upset  unusually teary eyes This list may not describe all possible side effects. Call your doctor for medical advice about side effects. You may report side effects to FDA at 1-800-FDA-1088. Where should I keep my medicine? This drug is given in a hospital or clinic and will not be stored at home. NOTE: This sheet is a summary. It may not cover all possible information. If you have questions about this medicine, talk to your doctor, pharmacist, or health care provider.  2021 Elsevier/Gold Standard (2019-01-17 15:00:03)

## 2020-08-02 ENCOUNTER — Inpatient Hospital Stay: Payer: 59

## 2020-08-02 ENCOUNTER — Other Ambulatory Visit: Payer: Self-pay

## 2020-08-02 VITALS — BP 142/87 | HR 79 | Temp 98.8°F | Resp 18

## 2020-08-02 DIAGNOSIS — C252 Malignant neoplasm of tail of pancreas: Secondary | ICD-10-CM

## 2020-08-02 DIAGNOSIS — Z5111 Encounter for antineoplastic chemotherapy: Secondary | ICD-10-CM | POA: Diagnosis not present

## 2020-08-02 MED ORDER — SODIUM CHLORIDE 0.9% FLUSH
10.0000 mL | INTRAVENOUS | Status: DC | PRN
  Administered 2020-08-02: 10 mL
  Filled 2020-08-02: qty 10

## 2020-08-02 MED ORDER — HEPARIN SOD (PORK) LOCK FLUSH 100 UNIT/ML IV SOLN
500.0000 [IU] | Freq: Once | INTRAVENOUS | Status: AC | PRN
  Administered 2020-08-02: 500 [IU]
  Filled 2020-08-02: qty 5

## 2020-08-02 NOTE — Patient Instructions (Signed)

## 2020-08-08 ENCOUNTER — Other Ambulatory Visit: Payer: Self-pay

## 2020-08-08 DIAGNOSIS — C252 Malignant neoplasm of tail of pancreas: Secondary | ICD-10-CM

## 2020-08-11 ENCOUNTER — Other Ambulatory Visit: Payer: Self-pay | Admitting: Oncology

## 2020-08-12 ENCOUNTER — Other Ambulatory Visit: Payer: Self-pay

## 2020-08-12 ENCOUNTER — Inpatient Hospital Stay: Payer: 59 | Admitting: Licensed Clinical Social Worker

## 2020-08-12 ENCOUNTER — Inpatient Hospital Stay: Payer: 59 | Admitting: Nutrition

## 2020-08-12 DIAGNOSIS — C252 Malignant neoplasm of tail of pancreas: Secondary | ICD-10-CM

## 2020-08-12 NOTE — Progress Notes (Signed)
Nutrition follow-up completed with patient receiving treatment for metastatic pancreas cancer.  She is followed by Dr. Benay Spice. Weight decreased and documented as 126.5 pounds on June 16.  This is down from 130.6 pounds on May 9.  Patient presented to nutrition appointment with her daughter.  She has been walking more and was able to walk to her appointment today instead of using a wheelchair.  She continues to limit foods that she is eating because she is afraid of worsening side effects.  States she has been more sensitive to sugar in beverages which has limited oral nutrition supplements.  She is concerned she will develop diarrhea if MD adds irinotecan to chemotherapy regimen.  Reports she basically sleeps for 2 days after her pump is removed and she is not eating during that time.  Nutrition diagnosis: Unintended weight loss continues.  Intervention: Educated on importance of continuing small frequent meals and snacks especially on days after pump is removed. Encouraged oral nutrition supplements and provided information on strategies for improving taste and decreasing sweetness. Discouraged patient limiting food choices unless she experiences a nutrition impact symptom from food. Reviewed strategies for eating if she develops diarrhea and provided nutrition fact sheet. Questions were answered.  Teach back method used.  Monitoring, evaluation, goals: Patient will tolerate increased calories and protein to minimize weight loss.  Next visit: To be scheduled as needed.  **Disclaimer: This note was dictated with voice recognition software. Similar sounding words can inadvertently be transcribed and this note may contain transcription errors which may not have been corrected upon publication of note.**

## 2020-08-12 NOTE — Progress Notes (Signed)
[facility] Social [facility]  Clinical Social Work was referred to review and complete healthcare advance directives.  Clinical Social Worker/ chaplain met with patient in support services.  The patient designated Kirsten [name] (daughter) as their primary healthcare agent and Kirsten [name] as their secondary agent.  Patient also completed healthcare living will.    Clinical Social Worker notarized documents and made copies for patient/family. Clinical Social Worker will send documents to medical records to be scanned into patient's chart. Clinical Social Worker encouraged patient/family to contact with any additional questions or concerns.   [place] [phone]

## 2020-08-13 ENCOUNTER — Encounter: Payer: Self-pay | Admitting: Nurse Practitioner

## 2020-08-13 ENCOUNTER — Inpatient Hospital Stay: Payer: 59

## 2020-08-13 ENCOUNTER — Inpatient Hospital Stay (HOSPITAL_BASED_OUTPATIENT_CLINIC_OR_DEPARTMENT_OTHER): Payer: 59 | Admitting: Nurse Practitioner

## 2020-08-13 VITALS — BP 159/86 | HR 87 | Temp 97.7°F | Resp 18 | Ht 63.0 in | Wt 126.8 lb

## 2020-08-13 DIAGNOSIS — C259 Malignant neoplasm of pancreas, unspecified: Secondary | ICD-10-CM

## 2020-08-13 DIAGNOSIS — C252 Malignant neoplasm of tail of pancreas: Secondary | ICD-10-CM | POA: Diagnosis not present

## 2020-08-13 DIAGNOSIS — C787 Secondary malignant neoplasm of liver and intrahepatic bile duct: Secondary | ICD-10-CM

## 2020-08-13 DIAGNOSIS — Z5111 Encounter for antineoplastic chemotherapy: Secondary | ICD-10-CM | POA: Diagnosis not present

## 2020-08-13 LAB — CBC WITH DIFFERENTIAL/PLATELET
Abs Immature Granulocytes: 0.01 10*3/uL (ref 0.00–0.07)
Basophils Absolute: 0 10*3/uL (ref 0.0–0.1)
Basophils Relative: 1 %
Eosinophils Absolute: 0 10*3/uL (ref 0.0–0.5)
Eosinophils Relative: 1 %
HCT: 35.2 % — ABNORMAL LOW (ref 36.0–46.0)
Hemoglobin: 11.1 g/dL — ABNORMAL LOW (ref 12.0–15.0)
Immature Granulocytes: 0 %
Lymphocytes Relative: 38 %
Lymphs Abs: 2.1 10*3/uL (ref 0.7–4.0)
MCH: 27.5 pg (ref 26.0–34.0)
MCHC: 31.5 g/dL (ref 30.0–36.0)
MCV: 87.3 fL (ref 80.0–100.0)
Monocytes Absolute: 0.9 10*3/uL (ref 0.1–1.0)
Monocytes Relative: 17 %
Neutro Abs: 2.5 10*3/uL (ref 1.7–7.7)
Neutrophils Relative %: 43 %
Platelets: 235 10*3/uL (ref 150–400)
RBC: 4.03 MIL/uL (ref 3.87–5.11)
RDW: 14.8 % (ref 11.5–15.5)
WBC: 5.7 10*3/uL (ref 4.0–10.5)
nRBC: 0 % (ref 0.0–0.2)

## 2020-08-13 LAB — CBC (CANCER CENTER ONLY)
HCT: 35.5 % — ABNORMAL LOW (ref 36.0–46.0)
Hemoglobin: 11 g/dL — ABNORMAL LOW (ref 12.0–15.0)
MCH: 27.2 pg (ref 26.0–34.0)
MCHC: 31 g/dL (ref 30.0–36.0)
MCV: 87.7 fL (ref 80.0–100.0)
Platelet Count: 222 10*3/uL (ref 150–400)
RBC: 4.05 MIL/uL (ref 3.87–5.11)
RDW: 15 % (ref 11.5–15.5)
WBC Count: 5.5 10*3/uL (ref 4.0–10.5)
nRBC: 0 % (ref 0.0–0.2)

## 2020-08-13 LAB — CMP (CANCER CENTER ONLY)
ALT: 26 U/L (ref 0–44)
AST: 36 U/L (ref 15–41)
Albumin: 3.3 g/dL — ABNORMAL LOW (ref 3.5–5.0)
Alkaline Phosphatase: 499 U/L — ABNORMAL HIGH (ref 38–126)
Anion gap: 7 (ref 5–15)
BUN: 6 mg/dL — ABNORMAL LOW (ref 8–23)
CO2: 27 mmol/L (ref 22–32)
Calcium: 9.5 mg/dL (ref 8.9–10.3)
Chloride: 101 mmol/L (ref 98–111)
Creatinine: 0.84 mg/dL (ref 0.44–1.00)
GFR, Estimated: >60 mL/min (ref >60)
Glucose, Bld: 109 mg/dL — ABNORMAL HIGH (ref 70–99)
Potassium: 4 mmol/L (ref 3.5–5.1)
Sodium: 135 mmol/L (ref 135–145)
Total Bilirubin: 0.4 mg/dL (ref 0.3–1.2)
Total Protein: 7.3 g/dL (ref 6.5–8.1)

## 2020-08-13 MED ORDER — SODIUM CHLORIDE 0.9 % IV SOLN
125.0000 mg/m2 | Freq: Once | INTRAVENOUS | Status: AC
  Administered 2020-08-13: 200 mg via INTRAVENOUS
  Filled 2020-08-13: qty 10

## 2020-08-13 MED ORDER — ATROPINE SULFATE 1 MG/ML IJ SOLN
0.4000 mg | Freq: Once | INTRAMUSCULAR | Status: AC
  Administered 2020-08-13: 0.4 mg via INTRAVENOUS

## 2020-08-13 MED ORDER — SODIUM CHLORIDE 0.9 % IV SOLN
10.0000 mg | Freq: Once | INTRAVENOUS | Status: AC
  Administered 2020-08-13: 10 mg via INTRAVENOUS
  Filled 2020-08-13: qty 1

## 2020-08-13 MED ORDER — HYDROCODONE-ACETAMINOPHEN 10-325 MG PO TABS: 1.0000 | ORAL_TABLET | Freq: Four times a day (QID) | ORAL | 0 refills | Status: DC | PRN

## 2020-08-13 MED ORDER — PALONOSETRON HCL INJECTION 0.25 MG/5ML
0.2500 mg | Freq: Once | INTRAVENOUS | Status: AC
  Administered 2020-08-13: 0.25 mg via INTRAVENOUS
  Filled 2020-08-13: qty 5

## 2020-08-13 MED ORDER — SODIUM CHLORIDE 0.9 % IV SOLN
2400.0000 mg/m2 | INTRAVENOUS | Status: DC
  Administered 2020-08-13: 3850 mg via INTRAVENOUS
  Filled 2020-08-13: qty 77

## 2020-08-13 MED ORDER — ATROPINE SULFATE 1 MG/ML IJ SOLN
INTRAMUSCULAR | Status: AC
  Filled 2020-08-13: qty 1

## 2020-08-13 MED ORDER — FENTANYL 12 MCG/HR TD PT72: 1.0000 | MEDICATED_PATCH | TRANSDERMAL | 0 refills | Status: DC

## 2020-08-13 MED ORDER — PROCHLORPERAZINE MALEATE 10 MG PO TABS: 10.0000 mg | ORAL_TABLET | Freq: Four times a day (QID) | ORAL | 2 refills | Status: DC | PRN

## 2020-08-13 MED ORDER — OXALIPLATIN CHEMO INJECTION 100 MG/20ML
85.0000 mg/m2 | Freq: Once | INTRAVENOUS | Status: AC
  Administered 2020-08-13: 135 mg via INTRAVENOUS
  Filled 2020-08-13: qty 27

## 2020-08-13 MED ORDER — SODIUM CHLORIDE 0.9 % IV SOLN
150.0000 mg | Freq: Once | INTRAVENOUS | Status: AC
  Administered 2020-08-13: 150 mg via INTRAVENOUS
  Filled 2020-08-13: qty 5

## 2020-08-13 MED ORDER — SODIUM CHLORIDE 0.9 % IV SOLN
400.0000 mg/m2 | Freq: Once | INTRAVENOUS | Status: AC
  Administered 2020-08-13: 644 mg via INTRAVENOUS
  Filled 2020-08-13: qty 32.2

## 2020-08-13 MED ORDER — DEXTROSE 5 % IV SOLN
Freq: Once | INTRAVENOUS | Status: AC
  Filled 2020-08-13: qty 250

## 2020-08-13 NOTE — Progress Notes (Signed)
Pt c/o abdominal cramping, infusion paused, consulted with Ned Card NP and Atropine 0.4 mg given IVP.  At 1430 patient stated that cramping had resolved.

## 2020-08-13 NOTE — Patient Instructions (Signed)
[facility]  Discharge Instructions: Thank you for choosing [facility] to provide your oncology and hematology care.   If you have a lab appointment with the [facility], please go directly to the [facility] and check in at the registration area.   Wear comfortable clothing and clothing appropriate for easy access to any Portacath or PICC line.   We strive to give you quality time with your provider. You may need to reschedule your appointment if you arrive late (15 or more minutes).  Arriving late affects you and other patients whose appointments are after yours.  Also, if you miss three or more appointments without notifying the office, you may be dismissed from the clinic at the provider's discretion.      For prescription refill requests, have your pharmacy contact our office and allow 72 hours for refills to be completed.    Today you received the following chemotherapy and/or immunotherapy agents FOLFIRINOX      To help prevent nausea and vomiting after your treatment, we encourage you to take your nausea medication as directed.  BELOW ARE SYMPTOMS THAT SHOULD BE REPORTED IMMEDIATELY: *FEVER GREATER THAN 100.4 F (38 C) OR HIGHER *CHILLS OR SWEATING *NAUSEA AND VOMITING THAT IS NOT CONTROLLED WITH YOUR NAUSEA MEDICATION *UNUSUAL SHORTNESS OF BREATH *UNUSUAL BRUISING OR BLEEDING *URINARY PROBLEMS (pain or burning when urinating, or frequent urination) *BOWEL PROBLEMS (unusual diarrhea, constipation, pain near the anus) TENDERNESS IN MOUTH AND THROAT WITH OR WITHOUT PRESENCE OF ULCERS (sore throat, sores in mouth, or a toothache) UNUSUAL RASH, SWELLING OR PAIN  UNUSUAL VAGINAL DISCHARGE OR ITCHING   Items with * indicate a potential emergency and should be followed up as soon as possible or go to the Emergency Department if any problems should occur.  Please show the CHEMOTHERAPY ALERT CARD or IMMUNOTHERAPY ALERT CARD at  check-in to the Emergency Department and triage nurse.  Should you have questions after your visit or need to cancel or reschedule your appointment, please contact [facility]  Dept: [phone]  and follow the prompts.  Office hours are 8:00 a.m. to 4:30 p.m. Monday - Friday. Please note that voicemails left after 4:00 p.m. may not be returned until the following business day.  We are closed weekends and major holidays. You have access to a nurse at all times for urgent questions. Please call the main number to the clinic Dept: [phone] and follow the prompts.   For any non-urgent questions, you may also contact your provider using MyChart. We now offer e-Visits for anyone 22 and older to request care online for non-urgent symptoms. For details visit [email or web address].   Also download the MyChart app! Go to the app store, search "MyChart", open the app, select [facility], and log in with your MyChart username and password.  Due to Covid, a mask is required upon entering the hospital/clinic. If you do not have a mask, one will be given to you upon arrival. For doctor visits, patients may have 1 support person aged 107 or older with them. For treatment visits, patients cannot have anyone with them due to current Covid guidelines and our immunocompromised population.

## 2020-08-13 NOTE — Progress Notes (Signed)
  [facility] OFFICE PROGRESS NOTE   Diagnosis: Pancreas cancer  INTERVAL HISTORY:   Ms. Talbot returns as scheduled.  She completed cycle 3 FOLFOX 07/30/2020.  She denies nausea/vomiting.  No mouth sores.  No diarrhea.  She avoids cold for about 5 days and is then able to resume without difficulty.  Appetite is better.  Pain is well controlled.  She is fatigued for about 2 days after the pump was removed.  Objective:  Vital signs in last 24 hours:  Blood pressure (!) 159/86, pulse 87, temperature 97.7 F (36.5 C), temperature source Oral, resp. rate 18, height 5\' 3"  (1.6 m), weight 126 lb 12.8 oz (57.5 kg), SpO2 100 %.    HEENT: No thrush or ulcers. Resp: Lungs clear bilaterally. Cardio: Regular rate and rhythm. GI: Abdomen soft and nontender.  Fullness mid and right upper abdomen.  No definite hepatomegaly. Vascular: No leg edema. Neuro: Vibratory sense mildly decreased over the fingertips per tuning fork exam. Skin: Palms with scattered areas of hyperpigmentation.  Port-A-Cath without erythema.   Lab Results:  Lab Results  Component Value Date   WBC 7.4 07/30/2020   HGB 10.7 (L) 07/30/2020   HCT 34.1 (L) 07/30/2020   MCV 86.8 07/30/2020   PLT 269 07/30/2020   NEUTROABS 3.9 07/30/2020    Imaging:  No results found.  Medications: I have reviewed the patient's current medications.  Assessment/Plan: 1.  Metastatic pancreas cancer Right upper quadrant ultrasound 06/17/2020-multiple hypoechoic liver lesions CT abdomen/pelvis 06/17/2020- pancreas tail mass, multiple liver metastases, enlarged periaortic nodes, right ovary metastasis?,  Nodularity at the left paracolic gutter and posterior/inferior stomach suggesting peritoneal tumor spread CT chest 06/17/2020- scattered small pulmonary nodules consistent with metastatic disease Ultrasound-guided biopsy of a left liver mass 06/18/2020- adenocarcinoma, morphology compatible with metastatic pancreatic  adenocarcinoma Cycle 1 FOLFOX 07/01/2020 Cycle 2 FOLFOX 07/16/2020 Cycle 3 FOLFOX 07/31/2020 Cycle 4 FOLFIRINOX 08/13/2020 2.  Pain secondary to #1 3.  Weight loss 4.  Family history of pancreas cancer  Disposition: Ms. Noyes appears stable.  She has completed 3 cycles of FOLFOX.  She tolerated cycle 3 well.  Plan to proceed with cycle 4 today as scheduled pending lab results.  Irinotecan will be added with cycle 4.  We reviewed potential toxicities associated with irinotecan.  She agrees to proceed.  She and her daughter understand to have Imodium on hand at home and contact the office with poorly controlled diarrhea.  Restaging CTs will be scheduled after cycle 5.  She will return for lab, follow-up, chemotherapy in 2 weeks.  She will contact the office in the interim with any problems.  Plan reviewed with Dr. Benay Spice.    Ned Card ANP/GNP-BC   08/13/2020  8:24 AM

## 2020-08-15 ENCOUNTER — Inpatient Hospital Stay: Payer: 59

## 2020-08-15 ENCOUNTER — Other Ambulatory Visit: Payer: Self-pay

## 2020-08-15 VITALS — BP 160/97 | HR 78 | Temp 98.0°F | Resp 20

## 2020-08-15 DIAGNOSIS — Z5111 Encounter for antineoplastic chemotherapy: Secondary | ICD-10-CM | POA: Diagnosis not present

## 2020-08-15 DIAGNOSIS — C252 Malignant neoplasm of tail of pancreas: Secondary | ICD-10-CM

## 2020-08-15 MED ORDER — SODIUM CHLORIDE 0.9% FLUSH
10.0000 mL | INTRAVENOUS | Status: DC | PRN
  Administered 2020-08-15: 10 mL
  Filled 2020-08-15: qty 10

## 2020-08-15 MED ORDER — HEPARIN SOD (PORK) LOCK FLUSH 100 UNIT/ML IV SOLN
500.0000 [IU] | Freq: Once | INTRAVENOUS | Status: AC | PRN
  Administered 2020-08-15: 500 [IU]
  Filled 2020-08-15: qty 5

## 2020-08-15 NOTE — Patient Instructions (Signed)
[facility]  Discharge Instructions: Thank you for choosing [facility] to provide your oncology and hematology care.   If you have a lab appointment with the [facility], please go directly to the [facility] and check in at the registration area.   Wear comfortable clothing and clothing appropriate for easy access to any Portacath or PICC line.   We strive to give you quality time with your provider. You may need to reschedule your appointment if you arrive late (15 or more minutes).  Arriving late affects you and other patients whose appointments are after yours.  Also, if you miss three or more appointments without notifying the office, you may be dismissed from the clinic at the provider's discretion.      For prescription refill requests, have your pharmacy contact our office and allow 72 hours for refills to be completed.    Today you received the following chemotherapy and/or immunotherapy agents: 35fu   To help prevent nausea and vomiting after your treatment, we encourage you to take your nausea medication as directed.  BELOW ARE SYMPTOMS THAT SHOULD BE REPORTED IMMEDIATELY: *FEVER GREATER THAN 100.4 F (38 C) OR HIGHER *CHILLS OR SWEATING *NAUSEA AND VOMITING THAT IS NOT CONTROLLED WITH YOUR NAUSEA MEDICATION *UNUSUAL SHORTNESS OF BREATH *UNUSUAL BRUISING OR BLEEDING *URINARY PROBLEMS (pain or burning when urinating, or frequent urination) *BOWEL PROBLEMS (unusual diarrhea, constipation, pain near the anus) TENDERNESS IN MOUTH AND THROAT WITH OR WITHOUT PRESENCE OF ULCERS (sore throat, sores in mouth, or a toothache) UNUSUAL RASH, SWELLING OR PAIN  UNUSUAL VAGINAL DISCHARGE OR ITCHING   Items with * indicate a potential emergency and should be followed up as soon as possible or go to the Emergency Department if any problems should occur.  Please show the CHEMOTHERAPY ALERT CARD or IMMUNOTHERAPY ALERT CARD at check-in to the  Emergency Department and triage nurse.  Should you have questions after your visit or need to cancel or reschedule your appointment, please contact [facility]  Dept: [phone]  and follow the prompts.  Office hours are 8:00 a.m. to 4:30 p.m. Monday - Friday. Please note that voicemails left after 4:00 p.m. may not be returned until the following business day.  We are closed weekends and major holidays. You have access to a nurse at all times for urgent questions. Please call the main number to the clinic Dept: [phone] and follow the prompts.   For any non-urgent questions, you may also contact your provider using MyChart. We now offer e-Visits for anyone 46 and older to request care online for non-urgent symptoms. For details visit [email or web address].   Also download the MyChart app! Go to the app store, search "MyChart", open the app, select [facility], and log in with your MyChart username and password.  Due to Covid, a mask is required upon entering the hospital/clinic. If you do not have a mask, one will be given to you upon arrival. For doctor visits, patients may have 1 support person aged 63 or older with them. For treatment visits, patients cannot have anyone with them due to current Covid guidelines and our immunocompromised population.    Fluorouracil, 5-FU injection What is this medication? FLUOROURACIL, 5-FU (flure oh YOOR a sil) is a chemotherapy drug. It slows the growth of cancer cells. This medicine is used to treat many types of cancer like breast cancer, colon or rectal cancer, pancreatic cancer, and stomachcancer. This medicine may be used  for other purposes; ask your health care provider orpharmacist if you have questions. COMMON BRAND NAME(S): Adrucil What should I tell my care team before I take this medication? They need to know if you have any of these conditions: blood disorders dihydropyrimidine dehydrogenase (DPD)  deficiency infection (especially a virus infection such as chickenpox, cold sores, or herpes) kidney disease liver disease malnourished, poor nutrition recent or ongoing radiation therapy an unusual or allergic reaction to fluorouracil, other chemotherapy, other medicines, foods, dyes, or preservatives pregnant or trying to get pregnant breast-feeding How should I use this medication? This drug is given as an infusion or injection into a vein. It is administeredin a hospital or clinic by a specially trained health care professional. Talk to your pediatrician regarding the use of this medicine in children.Special care may be needed. Overdosage: If you think you have taken too much of this medicine contact apoison control center or emergency room at once. NOTE: This medicine is only for you. Do not share this medicine with others. What if I miss a dose? It is important not to miss your dose. Call your doctor or health careprofessional if you are unable to keep an appointment. What may interact with this medication? Do not take this medicine with any of the following medications: live virus vaccines This medicine may also interact with the following medications: medicines that treat or prevent blood clots like warfarin, enoxaparin, and dalteparin This list may not describe all possible interactions. Give your health care provider a list of all the medicines, herbs, non-prescription drugs, or dietary supplements you use. Also tell them if you smoke, drink alcohol, or use illegaldrugs. Some items may interact with your medicine. What should I watch for while using this medication? Visit your doctor for checks on your progress. This drug may make you feel generally unwell. This is not uncommon, as chemotherapy can affect healthy cells as well as cancer cells. Report any side effects. Continue your course oftreatment even though you feel ill unless your doctor tells you to stop. In some cases, you  may be given additional medicines to help with side effects.Follow all directions for their use. Call your doctor or health care professional for advice if you get a fever, chills or sore throat, or other symptoms of a cold or flu. Do not treat yourself. This drug decreases your body's ability to fight infections. Try toavoid being around people who are sick. This medicine may increase your risk to bruise or bleed. Call your doctor orhealth care professional if you notice any unusual bleeding. Be careful brushing and flossing your teeth or using a toothpick because you may get an infection or bleed more easily. If you have any dental work done,tell your dentist you are receiving this medicine. Avoid taking products that contain aspirin, acetaminophen, ibuprofen, naproxen, or ketoprofen unless instructed by your doctor. These medicines may hide afever. Do not become pregnant while taking this medicine. Women should inform their doctor if they wish to become pregnant or think they might be pregnant. There is a potential for serious side effects to an unborn child. Talk to your health care professional or pharmacist for more information. Do not breast-feed aninfant while taking this medicine. Men should inform their doctor if they wish to father a child. This medicinemay lower sperm counts. Do not treat diarrhea with over the counter products. Contact your doctor ifyou have diarrhea that lasts more than 2 days or if it is severe and watery. This medicine can make  you more sensitive to the sun. Keep out of the sun. If you cannot avoid being in the sun, wear protective clothing and use sunscreen.Do not use sun lamps or tanning beds/booths. What side effects may I notice from receiving this medication? Side effects that you should report to your doctor or health care professionalas soon as possible: allergic reactions like skin rash, itching or hives, swelling of the face, lips, or tongue low blood counts -  this medicine may decrease the number of white blood cells, red blood cells and platelets. You may be at increased risk for infections and bleeding. signs of infection - fever or chills, cough, sore throat, pain or difficulty passing urine signs of decreased platelets or bleeding - bruising, pinpoint red spots on the skin, black, tarry stools, blood in the urine signs of decreased red blood cells - unusually weak or tired, fainting spells, lightheadedness breathing problems changes in vision chest pain mouth sores nausea and vomiting pain, swelling, redness at site where injected pain, tingling, numbness in the hands or feet redness, swelling, or sores on hands or feet stomach pain unusual bleeding Side effects that usually do not require medical attention (report to yourdoctor or health care professional if they continue or are bothersome): changes in finger or toe nails diarrhea dry or itchy skin hair loss headache loss of appetite sensitivity of eyes to the light stomach upset unusually teary eyes This list may not describe all possible side effects. Call your doctor for medical advice about side effects. You may report side effects to FDA at1-800-FDA-1088. Where should I keep my medication? This drug is given in a hospital or clinic and will not be stored at home. NOTE: This sheet is a summary. It may not cover all possible information. If you have questions about this medicine, talk to your doctor, pharmacist, orhealth care provider.  2022 Elsevier/Gold Standard (2019-01-17 15:00:03)

## 2020-08-21 ENCOUNTER — Other Ambulatory Visit: Payer: Self-pay

## 2020-08-21 DIAGNOSIS — C787 Secondary malignant neoplasm of liver and intrahepatic bile duct: Secondary | ICD-10-CM

## 2020-08-21 DIAGNOSIS — C259 Malignant neoplasm of pancreas, unspecified: Secondary | ICD-10-CM

## 2020-08-21 DIAGNOSIS — Z1379 Encounter for other screening for genetic and chromosomal anomalies: Secondary | ICD-10-CM | POA: Insufficient documentation

## 2020-08-22 ENCOUNTER — Telehealth: Payer: Self-pay | Admitting: Genetic Counselor

## 2020-08-22 NOTE — Telephone Encounter (Signed)
LVM that Kirsten [name]'s genetic test results are available and requested that she call back to discuss them.

## 2020-08-23 ENCOUNTER — Ambulatory Visit: Payer: Self-pay | Admitting: Genetic Counselor

## 2020-08-23 ENCOUNTER — Encounter: Payer: Self-pay | Admitting: Genetic Counselor

## 2020-08-23 DIAGNOSIS — Z1379 Encounter for other screening for genetic and chromosomal anomalies: Secondary | ICD-10-CM

## 2020-08-23 NOTE — Telephone Encounter (Signed)
Revealed negative genetic testing. Discussed that we do not know why she has pancreatic cancer or why there is cancer in the family. There could be a genetic mutation in the family that Kirsten [name] did not inherit. There could also be a mutation in a different gene that we are not testing, or our current technology may not be able to detect certain mutations. It will therefore be important for her to stay in contact with genetics to keep up with whether additional testing may be appropriate in the future.   Discussed recommendation for Kirsten [name]'s children, Kirsten [name] and Kirsten [name], to consider pancreatic cancer screening at the appropriate age.  Three variants of uncertain significance were detected - one in the PTCH1 gene called c.76C>T (p.Arg26Trp), a second in the RAD51C gene called c.484G>A (p.Gly162Arg), and a third in the WRN gene called c.445C>T (p.Arg149Cys). Her result is still considered normal at this time and should not impact her medical management.

## 2020-08-23 NOTE — Progress Notes (Signed)
HPI:  Kirsten [name] was previously seen in the [place] clinic due to a personal and family history of cancer and concerns regarding a hereditary predisposition to cancer. Please refer to our prior cancer genetics clinic note for more information regarding our discussion, assessment and recommendations, at the time. Kirsten [name] recent genetic test results were disclosed to her, as were recommendations warranted by these results. These results and recommendations are discussed in more detail below.  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 Three variants of uncertain significance (VUS) were detected - one in the PTCH1 gene called c.76C>T (p.Arg26Trp), a second in the RAD51C gene called c.484G>A (p.Gly162Arg), and a third in the WRN gene called c.445C>T (p.Arg149Cys). The report date is 08/21/2020.  The Multi-Cancer Panel offered by [facility] includes sequencing and/or deletion duplication testing of the following 84 genes: AIP, ALK, APC, ATM, AXIN2,BAP1,  BARD1, BLM, BMPR1A, BRCA1, BRCA2, BRIP1, CASR, CDC73, CDH1, CDK4, CDKN1B, CDKN1C, CDKN2A (p14ARF), CDKN2A (p16INK4a), CEBPA, CHEK2, CTNNA1, DICER1, DIS3L2, EGFR (c.2369C>T, p.Thr790Met variant only), EPCAM (Deletion/duplication testing only), FH, FLCN, GATA2, GPC3, GREM1 (Promoter region deletion/duplication testing only), HOXB13 (c.251G>A, p.Gly84Glu), HRAS, KIT, MAX, MEN1, MET, MITF (c.952G>A, p.Glu318Lys variant only), MLH1,  MSH2, MSH3, MSH6, MUTYH, NBN, NF1, NF2, NTHL1, PALB2, PDGFRA, PHOX2B, PMS2, POLD1, POLE, POT1, PRKAR1A, PTCH1, PTEN, RAD50, RAD51C, RAD51D, RB1, RECQL4, RET, RUNX1, SDHAF2, SDHA (sequence changes only), SDHB, SDHC, SDHD, SMAD4, SMARCA4, SMARCB1, SMARCE1, STK11, SUFU, TERC, TERT, TMEM127, TP53, TSC1, TSC2, VHL, WRN and WT1. The Pancreatic Cancer Panel offered by [facility] includes sequencing and deletion/duplication analysis of the following 29 genes: APC, ATM, BMPR1A, BRCA1, BRCA2, CASR, CDK4, CDKN2A, CFTR, CPA1, CTRC, EPCAM, FANCC, MEN1, MLH1, MSH2, MSH6, NF1, PALB2, PALLD, PMS2, PRSS1, SMAD4, SPINK1, STK11, TP53, TSC1, TSC2, and VHL.      FAMILY HISTORY:  We obtained a detailed, 4-generation family history.  Significant diagnoses are listed below: Family History  Problem Relation Age of Onset   Uterine cancer Mother        dx <50   Stomach cancer Father        dx <50   Dementia Father    Pancreatic cancer Sister 18   Cancer Cousin 35       unknown type, maternal first cousin     Kirsten [name] has one daughter (age 56) and one son (age 44). She had six full-brothers, three full-sisters, one maternal half-sister, two maternal half-brothers, one paternal half-sister, and three paternal half-brothers. One full-sister died from pancreatic cancer at age 61, and this sister's daughter had negative genetic testing (result not reviewed).   Kirsten [name] mother died at age 13 and had a history of uterine cancer diagnosed younger than 32. There were three maternal aunts and one maternal uncle (all half-siblings to her mother). There is no known cancer among maternal aunts/uncles. One maternal first cousin may have had cancer (unknown type) in his mid-69s. Kirsten [name] does not have information about  her maternal grandparents.   Kirsten [name] father is alive at age 80 and has a history of stomach cancer diagnosed younger than 35. There was one paternal aunt and 69 paternal uncles (all half-siblings to her  father). There is no known cancer among paternal aunts/uncles or paternal cousins. Kirsten [name] paternal grandmother died in her 21s without cancer. Her paternal grandfather died older than 58 without cancer.   Kirsten [name] is aware of previous family history of genetic testing for hereditary cancer risks. Patient's maternal ancestors are of [place] descent, and paternal ancestors are of unknown descent. There is no reported Ashkenazi Jewish ancestry. There is no known consanguinity.  GENETIC TEST RESULTS: Genetic testing reported out on 08/21/2020 through the [facility] Multi-Cancer panel + Pancreatic cancer panel. No pathogenic variants were detected.   The Multi-Cancer Panel offered by [facility] includes sequencing and/or deletion duplication testing of the following 84 genes: AIP, ALK, APC, ATM, AXIN2,BAP1,  BARD1, BLM, BMPR1A, BRCA1, BRCA2, BRIP1, CASR, CDC73, CDH1, CDK4, CDKN1B, CDKN1C, CDKN2A (p14ARF), CDKN2A (p16INK4a), CEBPA, CHEK2, CTNNA1, DICER1, DIS3L2, EGFR (c.2369C>T, p.Thr790Met variant only), EPCAM (Deletion/duplication testing only), FH, FLCN, GATA2, GPC3, GREM1 (Promoter region deletion/duplication testing only), HOXB13 (c.251G>A, p.Gly84Glu), HRAS, KIT, MAX, MEN1, MET, MITF (c.952G>A, p.Glu318Lys variant only), MLH1, MSH2, MSH3, MSH6, MUTYH, NBN, NF1, NF2, NTHL1, PALB2, PDGFRA, PHOX2B, PMS2, POLD1, POLE, POT1, PRKAR1A, PTCH1, PTEN, RAD50, RAD51C, RAD51D, RB1, RECQL4, RET, RUNX1, SDHAF2, SDHA (sequence changes only), SDHB, SDHC, SDHD, SMAD4, SMARCA4, SMARCB1, SMARCE1, STK11, SUFU, TERC, TERT, TMEM127, TP53, TSC1, TSC2, VHL, WRN and WT1. The Pancreatic Cancer Panel offered by [facility] includes sequencing and deletion/duplication analysis of the following 29 genes: APC, ATM, BMPR1A, BRCA1, BRCA2, CASR, CDK4, CDKN2A, CFTR, CPA1, CTRC, EPCAM, FANCC, MEN1, MLH1, MSH2, MSH6, NF1, PALB2, PALLD, PMS2, PRSS1, SMAD4, SPINK1, STK11, TP53, TSC1, TSC2, and VHL. The test report will be scanned  into EPIC and located under the Molecular Pathology section of the Results Review tab.  A portion of the result report is included below for reference.     We discussed with Kirsten [name] that because current genetic testing is not perfect, it is possible there may be a gene mutation in one of these genes that current testing cannot detect, but that chance is small.  We also discussed that there could be another gene that has not yet been discovered, or that we have not yet tested, that is responsible for the cancer diagnoses in the family. It is also possible there is a hereditary cause for the cancer in the family that Kirsten [name] did not inherit and therefore was not identified in her testing. Therefore, it is important to remain in touch with cancer genetics in the future so that we can continue to offer Kirsten [name] the most up to date genetic testing.   Genetic testing did identify three variants of uncertain significance (VUS) - oone in the PTCH1 gene called c.76C>T (p.Arg26Trp), a second in the RAD51C gene called c.484G>A (p.Gly162Arg), and a third in the WRN gene called c.445C>T (p.Arg149Cys).  At this time, it is unknown if these variants are associated with increased cancer risk or if they are normal findings, but most variants such as these get reclassified to being inconsequential. They should not be used to make medical management decisions. With time, we suspect the lab will determine the significance of these variants, if any. If we do learn more about them, we will try to contact Kirsten [name] to discuss it further. However, it is important to  stay in touch with [place] periodically and keep the address and phone number up to date.  ADDITIONAL GENETIC TESTING: We discussed with Kirsten [name] that her genetic testing was fairly extensive.  If there are genes identified to increase cancer risk that can be analyzed in the future, we would be happy to discuss and coordinate this testing at that time.     CANCER SCREENING RECOMMENDATIONS: Kirsten [name] test result is considered negative (normal).  This means that we have not identified a hereditary cause for her personal and family history of cancer at this time. While reassuring, this does not definitively rule out a hereditary predisposition to cancer. It is still possible that there could be genetic mutations that are undetectable by current technology. There could be genetic mutations in genes that have not been tested or identified to increase cancer risk. Therefore, it is recommended she continue to follow the cancer management and screening guidelines provided by her oncology and primary healthcare provider.   An individual's cancer risk and medical management are not determined by genetic test results alone. Overall cancer risk assessment incorporates additional factors, including personal medical history, family history, and any available genetic information that may result in a personalized plan for cancer prevention and surveillance.  RECOMMENDATIONS FOR FAMILY MEMBERS:  Individuals in this family might be at some increased risk of developing cancer, over the general population risk, simply due to the family history of cancer.  We recommended women in this family have a yearly mammogram beginning at age 64, or 46 years younger than the earliest onset of cancer, an annual clinical breast exam, and perform monthly breast self-exams. Women in this family should also have a gynecological exam as recommended by their primary provider. All family members should be referred for colonoscopy starting at age 96.  Per the International Cancer of the Pancreas Screening (CAPS) Consortium, individuals with a family history of pancreatic cancer in at least one first degree relative and one second degree relative should consider annual pancreatic cancer screening. We therefore recommended that Kirsten [name]'s children and siblings consider pancreatic cancer  screening beginning at age 47, or 45 years younger than the earliest diagnosis of pancreatic cancer in the family.   It is also possible there is a hereditary cause for the cancer in Kirsten [name]'s family that she did not inherit and therefore was not identified in her. Based on Kirsten [name]'s family history, we recommended her siblings have genetic counseling and testing. Ms. Gebhart will let us know if we can be of any assistance in coordinating genetic counseling and/or testing for these family members.   FOLLOW-UP: Lastly, we discussed with Ms. Kloeppel that cancer genetics is a rapidly advancing field and it is possible that new genetic tests will be appropriate for her and/or her family members in the future. We encouraged her to remain in contact with cancer genetics on an annual basis so we can update her personal and family histories and let her know of advances in cancer genetics that may benefit this family.   Our contact number was provided. Ms. Lohmeyer questions were answered to her satisfaction, and she knows she is welcome to call us at anytime with additional questions or concerns.   Clint Guy, MS, [facility] Genetic Counselor [email or web address] Phone: [phone]

## 2020-08-25 ENCOUNTER — Other Ambulatory Visit: Payer: Self-pay | Admitting: Oncology

## 2020-08-26 ENCOUNTER — Other Ambulatory Visit: Payer: Self-pay

## 2020-08-26 ENCOUNTER — Telehealth: Payer: Self-pay | Admitting: *Deleted

## 2020-08-26 ENCOUNTER — Inpatient Hospital Stay: Payer: 59

## 2020-08-26 ENCOUNTER — Inpatient Hospital Stay (HOSPITAL_BASED_OUTPATIENT_CLINIC_OR_DEPARTMENT_OTHER): Payer: 59 | Admitting: Oncology

## 2020-08-26 VITALS — BP 171/93 | HR 83 | Temp 98.1°F | Resp 18 | Ht 63.0 in | Wt 126.9 lb

## 2020-08-26 DIAGNOSIS — C787 Secondary malignant neoplasm of liver and intrahepatic bile duct: Secondary | ICD-10-CM

## 2020-08-26 DIAGNOSIS — Z5111 Encounter for antineoplastic chemotherapy: Secondary | ICD-10-CM | POA: Diagnosis not present

## 2020-08-26 DIAGNOSIS — C259 Malignant neoplasm of pancreas, unspecified: Secondary | ICD-10-CM

## 2020-08-26 LAB — CBC WITH DIFFERENTIAL (CANCER CENTER ONLY)
Abs Immature Granulocytes: 0 10*3/uL (ref 0.00–0.07)
Basophils Absolute: 0 10*3/uL (ref 0.0–0.1)
Basophils Relative: 1 %
Eosinophils Absolute: 0.1 10*3/uL (ref 0.0–0.5)
Eosinophils Relative: 2 %
HCT: 32.8 % — ABNORMAL LOW (ref 36.0–46.0)
Hemoglobin: 10.3 g/dL — ABNORMAL LOW (ref 12.0–15.0)
Immature Granulocytes: 0 %
Lymphocytes Relative: 55 %
Lymphs Abs: 1.7 10*3/uL (ref 0.7–4.0)
MCH: 27.2 pg (ref 26.0–34.0)
MCHC: 31.4 g/dL (ref 30.0–36.0)
MCV: 86.8 fL (ref 80.0–100.0)
Monocytes Absolute: 0.4 10*3/uL (ref 0.1–1.0)
Monocytes Relative: 14 %
Neutro Abs: 0.9 10*3/uL — ABNORMAL LOW (ref 1.7–7.7)
Neutrophils Relative %: 28 %
Platelet Count: 179 10*3/uL (ref 150–400)
RBC: 3.78 MIL/uL — ABNORMAL LOW (ref 3.87–5.11)
RDW: 15.2 % (ref 11.5–15.5)
WBC Count: 3.1 10*3/uL — ABNORMAL LOW (ref 4.0–10.5)
nRBC: 0 % (ref 0.0–0.2)

## 2020-08-26 LAB — CMP (CANCER CENTER ONLY)
ALT: 41 U/L (ref 0–44)
AST: 55 U/L — ABNORMAL HIGH (ref 15–41)
Albumin: 3.4 g/dL — ABNORMAL LOW (ref 3.5–5.0)
Alkaline Phosphatase: 502 U/L — ABNORMAL HIGH (ref 38–126)
Anion gap: 9 (ref 5–15)
BUN: <5 mg/dL — ABNORMAL LOW (ref 8–23)
CO2: 26 mmol/L (ref 22–32)
Calcium: 9.4 mg/dL (ref 8.9–10.3)
Chloride: 101 mmol/L (ref 98–111)
Creatinine: 0.7 mg/dL (ref 0.44–1.00)
GFR, Estimated: >60 mL/min (ref >60)
Glucose, Bld: 112 mg/dL — ABNORMAL HIGH (ref 70–99)
Potassium: 3.5 mmol/L (ref 3.5–5.1)
Sodium: 136 mmol/L (ref 135–145)
Total Bilirubin: 0.4 mg/dL (ref 0.3–1.2)
Total Protein: 6.6 g/dL (ref 6.5–8.1)

## 2020-08-26 NOTE — Telephone Encounter (Signed)
Called patient w/ CT appointment for 09/12/20 at 0945/1000. NPO after 6 am and drink oral contrast at 8am and 9am. She does want scan done via port. Scheduling notified to add port flush today. Will give her oral contrast on 09/03/20 when in office. Patient agrees to date/time

## 2020-08-26 NOTE — Progress Notes (Signed)
  [facility] OFFICE PROGRESS NOTE   Diagnosis: Pancreas cancer  INTERVAL HISTORY:   Kirsten [name] returns as scheduled.  She completed another cycle of FOLFIRINOX on 08/14/2019.  She had 1 episode of diarrhea during the week following chemotherapy.  No mouth sores or hand/foot pain.  She had difficulty with speech and writing immediately following the last cycle of chemotherapy.  This resolved quickly.  She has cold sensitivity in the extremities and numbness in the left greater than right fingers.  This does not interfere with activity.  Abdominal pain is improved.  Objective:  Vital signs in last 24 hours:  Blood pressure (!) 171/93, pulse 83, temperature 98.1 F (36.7 C), temperature source Oral, resp. rate 18, height 5\' 3"  (1.6 m), weight 126 lb 14.4 oz (57.6 kg), SpO2 100 %.    HEENT: No thrush or ulcers Resp: Lungs clear bilaterally Cardio: Regular rate and rhythm GI: No hepatosplenomegaly, fullness in the mid upper abdomen, nontender Vascular: No leg edema Neuro: Moderate loss of vibratory sense at the left fingertips, mild loss of vibratory sense at the right fingertips   Portacath/PICC-without erythema  Lab Results:  Lab Results  Component Value Date   WBC 3.1 (L) 08/26/2020   HGB 10.3 (L) 08/26/2020   HCT 32.8 (L) 08/26/2020   MCV 86.8 08/26/2020   PLT 179 08/26/2020   NEUTROABS 0.9 (L) 08/26/2020    CMP  Lab Results  Component Value Date   NA 135 08/13/2020   K 4.0 08/13/2020   CL 101 08/13/2020   CO2 27 08/13/2020   GLUCOSE 109 (H) 08/13/2020   BUN 6 (L) 08/13/2020   CREATININE 0.84 08/13/2020   CALCIUM 9.5 08/13/2020   PROT 7.3 08/13/2020   ALBUMIN 3.3 (L) 08/13/2020   AST 36 08/13/2020   ALT 26 08/13/2020   ALKPHOS 499 (H) 08/13/2020   BILITOT 0.4 08/13/2020   GFRNONAA >60 08/13/2020     Medications: I have reviewed the patient's current medications.   Assessment/Plan:  1. Metastatic pancreas cancer Right upper quadrant  ultrasound 06/17/2020-multiple hypoechoic liver lesions CT abdomen/pelvis 06/17/2020- pancreas tail mass, multiple liver metastases, enlarged periaortic nodes, right ovary metastasis?,  Nodularity at the left paracolic gutter and posterior/inferior stomach suggesting peritoneal tumor spread CT chest 06/17/2020- scattered small pulmonary nodules consistent with metastatic disease Ultrasound-guided biopsy of a left liver mass 06/18/2020- adenocarcinoma, morphology compatible with metastatic pancreatic adenocarcinoma Cycle 1 FOLFOX 07/01/2020 Cycle 2 FOLFOX 07/16/2020 Cycle 3 FOLFOX 07/31/2020 Cycle 4 FOLFIRINOX 08/13/2020 2.  Pain secondary to #1 3.  Weight loss 4.  Family history of pancreas cancer 5.  Neutropenia secondary chemotherapy-G-CSF added with cycle 5 FOLFIRINOX    Disposition: Ms. Meriweather has completed 4 cycles of chemotherapy.  Irinotecan was added with cycle 4.  She tolerated the irinotecan well.  Her overall clinical status has improved since beginning chemotherapy.  We will follow-up on the CA 19-9 from today.  Chemotherapy will be held today secondary to neutropenia.  She knows to call for a fever or symptoms of an infection.  G-CSF will be added with cycle 5.  We reviewed potential toxicities associated with G-CSF including the chance of a rash, bone pain, and splenic rupture.  She agrees to proceed.   Ms. Causey will return for cycle 5 chemotherapy on 09/03/2020.  She will be scheduled for a restaging CT prior to an office visit on 09/16/2020.  [name], MD  08/26/2020  9:35 AM

## 2020-08-27 LAB — CANCER ANTIGEN 19-9: CA 19-9: 40461 U/mL — ABNORMAL HIGH (ref 0–35)

## 2020-08-28 ENCOUNTER — Inpatient Hospital Stay: Payer: 59

## 2020-09-03 ENCOUNTER — Inpatient Hospital Stay: Payer: Medicaid Other

## 2020-09-03 ENCOUNTER — Other Ambulatory Visit: Payer: Self-pay | Admitting: Oncology

## 2020-09-03 ENCOUNTER — Inpatient Hospital Stay: Payer: Medicaid Other | Attending: Oncology

## 2020-09-03 ENCOUNTER — Other Ambulatory Visit: Payer: Self-pay

## 2020-09-03 VITALS — BP 147/85 | HR 78 | Temp 97.9°F | Resp 18 | Wt 124.2 lb

## 2020-09-03 DIAGNOSIS — C7800 Secondary malignant neoplasm of unspecified lung: Secondary | ICD-10-CM | POA: Diagnosis not present

## 2020-09-03 DIAGNOSIS — C787 Secondary malignant neoplasm of liver and intrahepatic bile duct: Secondary | ICD-10-CM | POA: Insufficient documentation

## 2020-09-03 DIAGNOSIS — Z5111 Encounter for antineoplastic chemotherapy: Secondary | ICD-10-CM | POA: Diagnosis present

## 2020-09-03 DIAGNOSIS — C252 Malignant neoplasm of tail of pancreas: Secondary | ICD-10-CM

## 2020-09-03 DIAGNOSIS — D701 Agranulocytosis secondary to cancer chemotherapy: Secondary | ICD-10-CM | POA: Diagnosis not present

## 2020-09-03 DIAGNOSIS — C259 Malignant neoplasm of pancreas, unspecified: Secondary | ICD-10-CM

## 2020-09-03 LAB — CBC WITH DIFFERENTIAL (CANCER CENTER ONLY)
Abs Immature Granulocytes: 0.02 10*3/uL (ref 0.00–0.07)
Basophils Absolute: 0 10*3/uL (ref 0.0–0.1)
Basophils Relative: 1 %
Eosinophils Absolute: 0.1 10*3/uL (ref 0.0–0.5)
Eosinophils Relative: 3 %
HCT: 35.9 % — ABNORMAL LOW (ref 36.0–46.0)
Hemoglobin: 11.3 g/dL — ABNORMAL LOW (ref 12.0–15.0)
Immature Granulocytes: 1 %
Lymphocytes Relative: 49 %
Lymphs Abs: 2 10*3/uL (ref 0.7–4.0)
MCH: 27.9 pg (ref 26.0–34.0)
MCHC: 31.5 g/dL (ref 30.0–36.0)
MCV: 88.6 fL (ref 80.0–100.0)
Monocytes Absolute: 0.8 10*3/uL (ref 0.1–1.0)
Monocytes Relative: 20 %
Neutro Abs: 1.1 10*3/uL — ABNORMAL LOW (ref 1.7–7.7)
Neutrophils Relative %: 26 %
Platelet Count: 265 10*3/uL (ref 150–400)
RBC: 4.05 MIL/uL (ref 3.87–5.11)
RDW: 16.5 % — ABNORMAL HIGH (ref 11.5–15.5)
WBC Count: 4 10*3/uL (ref 4.0–10.5)
nRBC: 0 % (ref 0.0–0.2)

## 2020-09-03 LAB — CMP (CANCER CENTER ONLY)
ALT: 25 U/L (ref 0–44)
AST: 38 U/L (ref 15–41)
Albumin: 3.5 g/dL (ref 3.5–5.0)
Alkaline Phosphatase: 473 U/L — ABNORMAL HIGH (ref 38–126)
Anion gap: 7 (ref 5–15)
BUN: 7 mg/dL — ABNORMAL LOW (ref 8–23)
CO2: 28 mmol/L (ref 22–32)
Calcium: 9.7 mg/dL (ref 8.9–10.3)
Chloride: 102 mmol/L (ref 98–111)
Creatinine: 0.97 mg/dL (ref 0.44–1.00)
GFR, Estimated: >60 mL/min (ref >60)
Glucose, Bld: 121 mg/dL — ABNORMAL HIGH (ref 70–99)
Potassium: 3.8 mmol/L (ref 3.5–5.1)
Sodium: 137 mmol/L (ref 135–145)
Total Bilirubin: 0.4 mg/dL (ref 0.3–1.2)
Total Protein: 7.1 g/dL (ref 6.5–8.1)

## 2020-09-03 MED ORDER — SODIUM CHLORIDE 0.9 % IV SOLN
150.0000 mg | Freq: Once | INTRAVENOUS | Status: AC
  Administered 2020-09-03: 150 mg via INTRAVENOUS
  Filled 2020-09-03: qty 5

## 2020-09-03 MED ORDER — SODIUM CHLORIDE 0.9 % IV SOLN
400.0000 mg/m2 | Freq: Once | INTRAVENOUS | Status: AC
  Administered 2020-09-03: 644 mg via INTRAVENOUS
  Filled 2020-09-03: qty 32.2

## 2020-09-03 MED ORDER — PALONOSETRON HCL INJECTION 0.25 MG/5ML
0.2500 mg | Freq: Once | INTRAVENOUS | Status: AC
  Administered 2020-09-03: 0.25 mg via INTRAVENOUS
  Filled 2020-09-03: qty 5

## 2020-09-03 MED ORDER — SODIUM CHLORIDE 0.9% FLUSH
10.0000 mL | INTRAVENOUS | Status: DC | PRN
  Administered 2020-09-03: 10 mL
  Filled 2020-09-03: qty 10

## 2020-09-03 MED ORDER — OXALIPLATIN CHEMO INJECTION 100 MG/20ML
85.0000 mg/m2 | Freq: Once | INTRAVENOUS | Status: AC
  Administered 2020-09-03: 135 mg via INTRAVENOUS
  Filled 2020-09-03: qty 10

## 2020-09-03 MED ORDER — SODIUM CHLORIDE 0.9 % IV SOLN
10.0000 mg | Freq: Once | INTRAVENOUS | Status: AC
  Administered 2020-09-03: 10 mg via INTRAVENOUS
  Filled 2020-09-03: qty 1

## 2020-09-03 MED ORDER — ATROPINE SULFATE 1 MG/ML IJ SOLN
0.5000 mg | Freq: Once | INTRAMUSCULAR | Status: AC | PRN
  Administered 2020-09-03: 0.5 mg via INTRAVENOUS
  Filled 2020-09-03: qty 1

## 2020-09-03 MED ORDER — SODIUM CHLORIDE 0.9 % IV SOLN
125.0000 mg/m2 | Freq: Once | INTRAVENOUS | Status: AC
  Administered 2020-09-03: 200 mg via INTRAVENOUS
  Filled 2020-09-03: qty 10

## 2020-09-03 MED ORDER — SODIUM CHLORIDE 0.9 % IV SOLN
2400.0000 mg/m2 | INTRAVENOUS | Status: DC
  Administered 2020-09-03: 3850 mg via INTRAVENOUS
  Filled 2020-09-03: qty 77

## 2020-09-03 MED ORDER — DEXTROSE 5 % IV SOLN
Freq: Once | INTRAVENOUS | Status: AC
  Filled 2020-09-03: qty 250

## 2020-09-03 NOTE — Patient Instructions (Signed)
Kirsten [name]  Discharge Instructions: Thank you for choosing [facility] to provide your oncology and hematology care.   If you have a lab appointment with the [facility], please go directly to the [facility] and check in at the registration area.   Wear comfortable clothing and clothing appropriate for easy access to any Portacath or PICC line.   We strive to give you quality time with your provider. You may need to reschedule your appointment if you arrive late (15 or more minutes).  Arriving late affects you and other patients whose appointments are after yours.  Also, if you miss three or more appointments without notifying the office, you may be dismissed from the clinic at the provider's discretion.      For prescription refill requests, have your pharmacy contact our office and allow 72 hours for refills to be completed.    Today you received the following chemotherapy and/or immunotherapy agents: irinotecan, folfox, leucovorin, floururacil.    To help prevent nausea and vomiting after your treatment, we encourage you to take your nausea medication as directed.  BELOW ARE SYMPTOMS THAT SHOULD BE REPORTED IMMEDIATELY: *FEVER GREATER THAN 100.4 F (38 C) OR HIGHER *CHILLS OR SWEATING *NAUSEA AND VOMITING THAT IS NOT CONTROLLED WITH YOUR NAUSEA MEDICATION *UNUSUAL SHORTNESS OF BREATH *UNUSUAL BRUISING OR BLEEDING *URINARY PROBLEMS (pain or burning when urinating, or frequent urination) *BOWEL PROBLEMS (unusual diarrhea, constipation, pain near the anus) TENDERNESS IN MOUTH AND THROAT WITH OR WITHOUT PRESENCE OF ULCERS (sore throat, sores in mouth, or a toothache) UNUSUAL RASH, SWELLING OR PAIN  UNUSUAL VAGINAL DISCHARGE OR ITCHING   Items with * indicate a potential emergency and should be followed up as soon as possible or go to the Emergency Department if any problems should occur.  Please show the CHEMOTHERAPY ALERT CARD or  IMMUNOTHERAPY ALERT CARD at check-in to the Emergency Department and triage nurse.  Should you have questions after your visit or need to cancel or reschedule your appointment, please contact [facility]  Dept: [phone]  and follow the prompts.  Office hours are 8:00 a.m. to 4:30 p.m. Monday - Friday. Please note that voicemails left after 4:00 p.m. may not be returned until the following business day.  We are closed weekends and major holidays. You have access to a nurse at all times for urgent questions. Please call the main number to the clinic Dept: [phone] and follow the prompts.   For any non-urgent questions, you may also contact your provider using MyChart. We now offer e-Visits for anyone 66 and older to request care online for non-urgent symptoms. For details visit [email or web address].   Also download the MyChart app! Go to the app store, search "MyChart", open the app, select [facility], and log in with your MyChart username and password.  Due to Covid, a mask is required upon entering the hospital/clinic. If you do not have a mask, one will be given to you upon arrival. For doctor visits, patients may have 1 support person aged 87 or older with them. For treatment visits, patients cannot have anyone with them due to current Covid guidelines and our immunocompromised population.   Irinotecan injection What is this medication? IRINOTECAN (ir in oh TEE kan ) is a chemotherapy drug. It is used to treatcolon and rectal cancer. This medicine may be used for other purposes; ask your health care provider orpharmacist if you have questions. COMMON BRAND NAME(S): Camptosar What  should I tell my care team before I take this medication? They need to know if you have any of these conditions: dehydration diarrhea infection (especially a virus infection such as chickenpox, cold sores, or herpes) liver disease low blood counts, like low white cell, platelet,  or red cell counts low levels of calcium, magnesium, or potassium in the blood recent or ongoing radiation therapy an unusual or allergic reaction to irinotecan, other medicines, foods, dyes, or preservatives pregnant or trying to get pregnant breast-feeding How should I use this medication? This drug is given as an infusion into a vein. It is administered in a hospitalor clinic by a specially trained health care professional. Talk to your pediatrician regarding the use of this medicine in children.Special care may be needed. Overdosage: If you think you have taken too much of this medicine contact apoison control center or emergency room at once. NOTE: This medicine is only for you. Do not share this medicine with others. What if I miss a dose? It is important not to miss your dose. Call your doctor or health careprofessional if you are unable to keep an appointment. What may interact with this medication? Do not take this medicine with any of the following medications: cobicistat itraconazole This medicine may interact with the following medications: antiviral medicines for HIV or AIDS certain antibiotics like rifampin or rifabutin certain medicines for fungal infections like ketoconazole, posaconazole, and voriconazole certain medicines for seizures like carbamazepine, phenobarbital, phenotoin clarithromycin gemfibrozil nefazodone [facility] John's Wort This list may not describe all possible interactions. Give your health care provider a list of all the medicines, herbs, non-prescription drugs, or dietary supplements you use. Also tell them if you smoke, drink alcohol, or use illegaldrugs. Some items may interact with your medicine. What should I watch for while using this medication? Your condition will be monitored carefully while you are receiving this medicine. You will need important blood work done while you are taking thismedicine. This drug may make you feel generally unwell. This  is not uncommon, as chemotherapy can affect healthy cells as well as cancer cells. Report any side effects. Continue your course of treatment even though you feel ill unless yourdoctor tells you to stop. In some cases, you may be given additional medicines to help with side effects.Follow all directions for their use. You may get drowsy or dizzy. Do not drive, use machinery, or do anything that needs mental alertness until you know how this medicine affects you. Do not stand or sit up quickly, especially if you are an older patient. This reducesthe risk of dizzy or fainting spells. Call your health care professional for advice if you get a fever, chills, or sore throat, or other symptoms of a cold or flu. Do not treat yourself. This medicine decreases your body's ability to fight infections. Try to avoid beingaround people who are sick. Avoid taking products that contain aspirin, acetaminophen, ibuprofen, naproxen, or ketoprofen unless instructed by your doctor. These medicines may hide afever. This medicine may increase your risk to bruise or bleed. Call your doctor orhealth care professional if you notice any unusual bleeding. Be careful brushing and flossing your teeth or using a toothpick because you may get an infection or bleed more easily. If you have any dental work done,tell your dentist you are receiving this medicine. Do not become pregnant while taking this medicine or for 6 months after stopping it. Women should inform their health care professional if they wish to become pregnant or think  they might be pregnant. Men should not father a child while taking this medicine and for 3 months after stopping it. There is potential for serious side effects to an unborn child. Talk to your health careprofessional for more information. Do not breast-feed an infant while taking this medicine or for 7 days afterstopping it. This medicine has caused ovarian failure in some women. This medicine may make it  more difficult to get pregnant. Talk to your health care professional if Ventura Sellers concerned about your fertility. This medicine has caused decreased sperm counts in some men. This may make it more difficult to father a child. Talk to your health care professional if Ventura Sellers concerned about your fertility. What side effects may I notice from receiving this medication? Side effects that you should report to your doctor or health care professionalas soon as possible: allergic reactions like skin rash, itching or hives, swelling of the face, lips, or tongue chest pain diarrhea flushing, runny nose, sweating during infusion low blood counts - this medicine may decrease the number of white blood cells, red blood cells and platelets. You may be at increased risk for infections and bleeding. nausea, vomiting pain, swelling, warmth in the leg signs of decreased platelets or bleeding - bruising, pinpoint red spots on the skin, black, tarry stools, blood in the urine signs of infection - fever or chills, cough, sore throat, pain or difficulty passing urine signs of decreased red blood cells - unusually weak or tired, fainting spells, lightheadedness Side effects that usually do not require medical attention (report to yourdoctor or health care professional if they continue or are bothersome): constipation hair loss headache loss of appetite mouth sores stomach pain This list may not describe all possible side effects. Call your doctor for medical advice about side effects. You may report side effects to FDA at1-800-FDA-1088. Where should I keep my medication? This drug is given in a hospital or clinic and will not be stored at home. NOTE: This sheet is a summary. It may not cover all possible information. If you have questions about this medicine, talk to your doctor, pharmacist, orhealth care provider.  2022 Elsevier/Gold Standard (2019-01-17 17:46:13)  Oxaliplatin Injection What is this  medication? OXALIPLATIN (ox AL i PLA tin) is a chemotherapy drug. It targets fast dividing cells, like cancer cells, and causes these cells to die. This medicine is usedto treat cancers of the colon and rectum, and many other cancers. This medicine may be used for other purposes; ask your health care provider orpharmacist if you have questions. COMMON BRAND NAME(S): Eloxatin What should I tell my care team before I take this medication? They need to know if you have any of these conditions: heart disease history of irregular heartbeat liver disease low blood counts, like white cells, platelets, or red blood cells lung or breathing disease, like asthma take medicines that treat or prevent blood clots tingling of the fingers or toes, or other nerve disorder an unusual or allergic reaction to oxaliplatin, other chemotherapy, other medicines, foods, dyes, or preservatives pregnant or trying to get pregnant breast-feeding How should I use this medication? This drug is given as an infusion into a vein. It is administered in a hospitalor clinic by a specially trained health care professional. Talk to your pediatrician regarding the use of this medicine in children.Special care may be needed. Overdosage: If you think you have taken too much of this medicine contact apoison control center or emergency room at once. NOTE: This medicine is only  for you. Do not share this medicine with others. What if I miss a dose? It is important not to miss a dose. Call your doctor or health careprofessional if you are unable to keep an appointment. What may interact with this medication? Do not take this medicine with any of the following medications: cisapride dronedarone pimozide thioridazine This medicine may also interact with the following medications: aspirin and aspirin-like medicines certain medicines that treat or prevent blood clots like warfarin, apixaban, dabigatran, and  rivaroxaban cisplatin cyclosporine diuretics medicines for infection like acyclovir, adefovir, amphotericin B, bacitracin, cidofovir, foscarnet, ganciclovir, gentamicin, pentamidine, vancomycin NSAIDs, medicines for pain and inflammation, like ibuprofen or naproxen other medicines that prolong the QT interval (an abnormal heart rhythm) pamidronate zoledronic acid This list may not describe all possible interactions. Give your health care provider a list of all the medicines, herbs, non-prescription drugs, or dietary supplements you use. Also tell them if you smoke, drink alcohol, or use illegaldrugs. Some items may interact with your medicine. What should I watch for while using this medication? Your condition will be monitored carefully while you are receiving thismedicine. You may need blood work done while you are taking this medicine. This medicine may make you feel generally unwell. This is not uncommon as chemotherapy can affect healthy cells as well as cancer cells. Report any side effects. Continue your course of treatment even though you feel ill unless yourhealthcare professional tells you to stop. This medicine can make you more sensitive to cold. Do not drink cold drinks or use ice. Cover exposed skin before coming in contact with cold temperatures or cold objects. When out in cold weather wear warm clothing and cover your mouth and nose to warm the air that goes into your lungs. Tell your doctor if you getsensitive to the cold. Do not become pregnant while taking this medicine or for 9 months after stopping it. Women should inform their health care professional if they wish to become pregnant or think they might be pregnant. Men should not father a child while taking this medicine and for 6 months after stopping it. There is potential for serious side effects to an unborn child. Talk to your health careprofessional for more information. Do not breast-feed a child while taking this  medicine or for 3 months afterstopping it. This medicine has caused ovarian failure in some women. This medicine may make it more difficult to get pregnant. Talk to your health care professional if Ventura Sellers concerned about your fertility. This medicine has caused decreased sperm counts in some men. This may make it more difficult to father a child. Talk to your health care professional if Ventura Sellers concerned about your fertility. This medicine may increase your risk of getting an infection. Call your health care professional for advice if you get a fever, chills, or sore throat, or other symptoms of a cold or flu. Do not treat yourself. Try to avoid beingaround people who are sick. Avoid taking medicines that contain aspirin, acetaminophen, ibuprofen, naproxen, or ketoprofen unless instructed by your health care professional.These medicines may hide a fever. Be careful brushing or flossing your teeth or using a toothpick because you may get an infection or bleed more easily. If you have any dental work done, Primary school teacher you are receiving this medicine. What side effects may I notice from receiving this medication? Side effects that you should report to your doctor or health care professionalas soon as possible: allergic reactions like skin rash, itching or hives, swelling of  the face, lips, or tongue breathing problems cough low blood counts - this medicine may decrease the number of white blood cells, red blood cells, and platelets. You may be at increased risk for infections and bleeding nausea, vomiting pain, redness, or irritation at site where injected pain, tingling, numbness in the hands or feet signs and symptoms of bleeding such as bloody or black, tarry stools; red or dark brown urine; spitting up blood or brown material that looks like coffee grounds; red spots on the skin; unusual bruising or bleeding from the eyes, gums, or nose signs and symptoms of a dangerous change in heartbeat or  heart rhythm like chest pain; dizziness; fast, irregular heartbeat; palpitations; feeling faint or lightheaded; falls signs and symptoms of infection like fever; chills; cough; sore throat; pain or trouble passing urine signs and symptoms of liver injury like dark yellow or brown urine; general ill feeling or flu-like symptoms; light-colored stools; loss of appetite; nausea; right upper belly pain; unusually weak or tired; yellowing of the eyes or skin signs and symptoms of low red blood cells or anemia such as unusually weak or tired; feeling faint or lightheaded; falls signs and symptoms of muscle injury like dark urine; trouble passing urine or change in the amount of urine; unusually weak or tired; muscle pain; back pain Side effects that usually do not require medical attention (report to yourdoctor or health care professional if they continue or are bothersome): changes in taste diarrhea gas hair loss loss of appetite mouth sores This list may not describe all possible side effects. Call your doctor for medical advice about side effects. You may report side effects to FDA at1-800-FDA-1088. Where should I keep my medication? This drug is given in a hospital or clinic and will not be stored at home. NOTE: This sheet is a summary. It may not cover all possible information. If you have questions about this medicine, talk to your doctor, pharmacist, orhealth care provider.  2022 Elsevier/Gold Standard (2018-07-06 12:20:35)  Leucovorin injection What is this medication? LEUCOVORIN (loo koe VOR in) is used to prevent or treat the harmful effects of some medicines. This medicine is used to treat anemia caused by a low amount of folic acid in the body. It is also used with 5-fluorouracil (5-FU) to treatcolon cancer. This medicine may be used for other purposes; ask your health care provider orpharmacist if you have questions. What should I tell my care team before I take this medication? They  need to know if you have any of these conditions: anemia from low levels of vitamin B-12 in the blood an unusual or allergic reaction to leucovorin, folic acid, other medicines, foods, dyes, or preservatives pregnant or trying to get pregnant breast-feeding How should I use this medication? This medicine is for injection into a muscle or into a vein. It is given by ahealth care professional in a hospital or clinic setting. Talk to your pediatrician regarding the use of this medicine in children.Special care may be needed. Overdosage: If you think you have taken too much of this medicine contact apoison control center or emergency room at once. NOTE: This medicine is only for you. Do not share this medicine with others. What if I miss a dose? This does not apply. What may interact with this medication? capecitabine fluorouracil phenobarbital phenytoin primidone trimethoprim-sulfamethoxazole This list may not describe all possible interactions. Give your health care provider a list of all the medicines, herbs, non-prescription drugs, or dietary supplements you use. Also  tell them if you smoke, drink alcohol, or use illegaldrugs. Some items may interact with your medicine. What should I watch for while using this medication? Your condition will be monitored carefully while you are receiving thismedicine. This medicine may increase the side effects of 5-fluorouracil, 5-FU. Tell your doctor or health care professional if you have diarrhea or mouth sores that donot get better or that get worse. What side effects may I notice from receiving this medication? Side effects that you should report to your doctor or health care professionalas soon as possible: allergic reactions like skin rash, itching or hives, swelling of the face, lips, or tongue breathing problems fever, infection mouth sores unusual bleeding or bruising unusually weak or tired Side effects that usually do not require medical  attention (report to yourdoctor or health care professional if they continue or are bothersome): constipation or diarrhea loss of appetite nausea, vomiting This list may not describe all possible side effects. Call your doctor for medical advice about side effects. You may report side effects to FDA at1-800-FDA-1088. Where should I keep my medication? This drug is given in a hospital or clinic and will not be stored at home. NOTE: This sheet is a summary. It may not cover all possible information. If you have questions about this medicine, talk to your doctor, pharmacist, orhealth care provider.  2022 Elsevier/Gold Standard (2007-08-23 16:50:29)  Fluorouracil, 5-FU injection What is this medication? FLUOROURACIL, 5-FU (flure oh YOOR a sil) is a chemotherapy drug. It slows the growth of cancer cells. This medicine is used to treat many types of cancer like breast cancer, colon or rectal cancer, pancreatic cancer, and stomachcancer. This medicine may be used for other purposes; ask your health care provider orpharmacist if you have questions. COMMON BRAND NAME(S): Adrucil What should I tell my care team before I take this medication? They need to know if you have any of these conditions: blood disorders dihydropyrimidine dehydrogenase (DPD) deficiency infection (especially a virus infection such as chickenpox, cold sores, or herpes) kidney disease liver disease malnourished, poor nutrition recent or ongoing radiation therapy an unusual or allergic reaction to fluorouracil, other chemotherapy, other medicines, foods, dyes, or preservatives pregnant or trying to get pregnant breast-feeding How should I use this medication? This drug is given as an infusion or injection into a vein. It is administeredin a hospital or clinic by a specially trained health care professional. Talk to your pediatrician regarding the use of this medicine in children.Special care may be needed. Overdosage: If you  think you have taken too much of this medicine contact apoison control center or emergency room at once. NOTE: This medicine is only for you. Do not share this medicine with others. What if I miss a dose? It is important not to miss your dose. Call your doctor or health careprofessional if you are unable to keep an appointment. What may interact with this medication? Do not take this medicine with any of the following medications: live virus vaccines This medicine may also interact with the following medications: medicines that treat or prevent blood clots like warfarin, enoxaparin, and dalteparin This list may not describe all possible interactions. Give your health care provider a list of all the medicines, herbs, non-prescription drugs, or dietary supplements you use. Also tell them if you smoke, drink alcohol, or use illegaldrugs. Some items may interact with your medicine. What should I watch for while using this medication? Visit your doctor for checks on your progress. This drug may make  you feel generally unwell. This is not uncommon, as chemotherapy can affect healthy cells as well as cancer cells. Report any side effects. Continue your course oftreatment even though you feel ill unless your doctor tells you to stop. In some cases, you may be given additional medicines to help with side effects.Follow all directions for their use. Call your doctor or health care professional for advice if you get a fever, chills or sore throat, or other symptoms of a cold or flu. Do not treat yourself. This drug decreases your body's ability to fight infections. Try toavoid being around people who are sick. This medicine may increase your risk to bruise or bleed. Call your doctor orhealth care professional if you notice any unusual bleeding. Be careful brushing and flossing your teeth or using a toothpick because you may get an infection or bleed more easily. If you have any dental work done,tell your dentist  you are receiving this medicine. Avoid taking products that contain aspirin, acetaminophen, ibuprofen, naproxen, or ketoprofen unless instructed by your doctor. These medicines may hide afever. Do not become pregnant while taking this medicine. Women should inform their doctor if they wish to become pregnant or think they might be pregnant. There is a potential for serious side effects to an unborn child. Talk to your health care professional or pharmacist for more information. Do not breast-feed aninfant while taking this medicine. Men should inform their doctor if they wish to father a child. This medicinemay lower sperm counts. Do not treat diarrhea with over the counter products. Contact your doctor ifyou have diarrhea that lasts more than 2 days or if it is severe and watery. This medicine can make you more sensitive to the sun. Keep out of the sun. If you cannot avoid being in the sun, wear protective clothing and use sunscreen.Do not use sun lamps or tanning beds/booths. What side effects may I notice from receiving this medication? Side effects that you should report to your doctor or health care professionalas soon as possible: allergic reactions like skin rash, itching or hives, swelling of the face, lips, or tongue low blood counts - this medicine may decrease the number of white blood cells, red blood cells and platelets. You may be at increased risk for infections and bleeding. signs of infection - fever or chills, cough, sore throat, pain or difficulty passing urine signs of decreased platelets or bleeding - bruising, pinpoint red spots on the skin, black, tarry stools, blood in the urine signs of decreased red blood cells - unusually weak or tired, fainting spells, lightheadedness breathing problems changes in vision chest pain mouth sores nausea and vomiting pain, swelling, redness at site where injected pain, tingling, numbness in the hands or feet redness, swelling, or sores on  hands or feet stomach pain unusual bleeding Side effects that usually do not require medical attention (report to yourdoctor or health care professional if they continue or are bothersome): changes in finger or toe nails diarrhea dry or itchy skin hair loss headache loss of appetite sensitivity of eyes to the light stomach upset unusually teary eyes This list may not describe all possible side effects. Call your doctor for medical advice about side effects. You may report side effects to FDA at1-800-FDA-1088. Where should I keep my medication? This drug is given in a hospital or clinic and will not be stored at home. NOTE: This sheet is a summary. It may not cover all possible information. If you have questions about this medicine, talk  to your doctor, pharmacist, orhealth care provider.  2022 Elsevier/Gold Standard (2019-01-17 15:00:03)

## 2020-09-03 NOTE — Patient Instructions (Signed)
[facility]  Discharge Instructions: Thank you for choosing [facility] to provide your oncology and hematology care.   If you have a lab appointment with the [facility], please go directly to the [facility] and check in at the registration area.   Wear comfortable clothing and clothing appropriate for easy access to any Portacath or PICC line.   We strive to give you quality time with your provider. You may need to reschedule your appointment if you arrive late (15 or more minutes).  Arriving late affects you and other patients whose appointments are after yours.  Also, if you miss three or more appointments without notifying the office, you may be dismissed from the clinic at the provider's discretion.      For prescription refill requests, have your pharmacy contact our office and allow 72 hours for refills to be completed.    Today you received the following chemotherapy and/or immunotherapy agents FOLFIRINOX      To help prevent nausea and vomiting after your treatment, we encourage you to take your nausea medication as directed.  BELOW ARE SYMPTOMS THAT SHOULD BE REPORTED IMMEDIATELY: *FEVER GREATER THAN 100.4 F (38 C) OR HIGHER *CHILLS OR SWEATING *NAUSEA AND VOMITING THAT IS NOT CONTROLLED WITH YOUR NAUSEA MEDICATION *UNUSUAL SHORTNESS OF BREATH *UNUSUAL BRUISING OR BLEEDING *URINARY PROBLEMS (pain or burning when urinating, or frequent urination) *BOWEL PROBLEMS (unusual diarrhea, constipation, pain near the anus) TENDERNESS IN MOUTH AND THROAT WITH OR WITHOUT PRESENCE OF ULCERS (sore throat, sores in mouth, or a toothache) UNUSUAL RASH, SWELLING OR PAIN  UNUSUAL VAGINAL DISCHARGE OR ITCHING   Items with * indicate a potential emergency and should be followed up as soon as possible or go to the Emergency Department if any problems should occur.  Please show the CHEMOTHERAPY ALERT CARD or IMMUNOTHERAPY ALERT CARD at  check-in to the Emergency Department and triage nurse.  Should you have questions after your visit or need to cancel or reschedule your appointment, please contact [facility]  Dept: [phone]  and follow the prompts.  Office hours are 8:00 a.m. to 4:30 p.m. Monday - Friday. Please note that voicemails left after 4:00 p.m. may not be returned until the following business day.  We are closed weekends and major holidays. You have access to a nurse at all times for urgent questions. Please call the main number to the clinic Dept: [phone] and follow the prompts.   For any non-urgent questions, you may also contact your provider using MyChart. We now offer e-Visits for anyone 34 and older to request care online for non-urgent symptoms. For details visit [email or web address].   Also download the MyChart app! Go to the app store, search "MyChart", open the app, select [facility], and log in with your MyChart username and password.  Due to Covid, a mask is required upon entering the hospital/clinic. If you do not have a mask, one will be given to you upon arrival. For doctor visits, patients may have 1 support person aged 80 or older with them. For treatment visits, patients cannot have anyone with them due to current Covid guidelines and our immunocompromised population.

## 2020-09-03 NOTE — Progress Notes (Signed)
Per Dr. Benay Spice, ok to treat with [place] 1.1.

## 2020-09-05 ENCOUNTER — Other Ambulatory Visit: Payer: Self-pay

## 2020-09-05 ENCOUNTER — Inpatient Hospital Stay: Payer: Medicaid Other

## 2020-09-05 VITALS — BP 159/93 | HR 78 | Temp 98.1°F | Resp 18

## 2020-09-05 DIAGNOSIS — Z5111 Encounter for antineoplastic chemotherapy: Secondary | ICD-10-CM | POA: Diagnosis not present

## 2020-09-05 DIAGNOSIS — C252 Malignant neoplasm of tail of pancreas: Secondary | ICD-10-CM

## 2020-09-05 MED ORDER — PEGFILGRASTIM-CBQV 6 MG/0.6ML ~~LOC~~ SOSY
6.0000 mg | PREFILLED_SYRINGE | Freq: Once | SUBCUTANEOUS | Status: AC
  Administered 2020-09-05: 6 mg via SUBCUTANEOUS

## 2020-09-05 MED ORDER — SODIUM CHLORIDE 0.9% FLUSH
10.0000 mL | INTRAVENOUS | Status: DC | PRN
  Administered 2020-09-05: 10 mL
  Filled 2020-09-05: qty 10

## 2020-09-05 MED ORDER — HEPARIN SOD (PORK) LOCK FLUSH 100 UNIT/ML IV SOLN
500.0000 [IU] | Freq: Once | INTRAVENOUS | Status: AC | PRN
  Administered 2020-09-05: 500 [IU]
  Filled 2020-09-05: qty 5

## 2020-09-05 NOTE — Patient Instructions (Signed)

## 2020-09-09 ENCOUNTER — Other Ambulatory Visit: Payer: 59

## 2020-09-09 ENCOUNTER — Encounter: Payer: Self-pay | Admitting: Oncology

## 2020-09-09 ENCOUNTER — Ambulatory Visit: Payer: 59 | Admitting: Oncology

## 2020-09-09 ENCOUNTER — Ambulatory Visit: Payer: 59

## 2020-09-12 ENCOUNTER — Other Ambulatory Visit: Payer: Self-pay

## 2020-09-12 ENCOUNTER — Ambulatory Visit (HOSPITAL_BASED_OUTPATIENT_CLINIC_OR_DEPARTMENT_OTHER)
Admission: RE | Admit: 2020-09-12 | Discharge: 2020-09-12 | Disposition: A | Discharge status: Home / Self Care | Payer: Medicaid Other | Source: Ambulatory Visit | Attending: Oncology | Admitting: Oncology

## 2020-09-12 ENCOUNTER — Encounter (HOSPITAL_BASED_OUTPATIENT_CLINIC_OR_DEPARTMENT_OTHER): Payer: Self-pay | Admitting: Radiology

## 2020-09-12 ENCOUNTER — Inpatient Hospital Stay: Payer: Medicaid Other

## 2020-09-12 DIAGNOSIS — C259 Malignant neoplasm of pancreas, unspecified: Secondary | ICD-10-CM

## 2020-09-12 MED ORDER — IOHEXOL 300 MG/ML  SOLN
80.0000 mL | Freq: Once | INTRAMUSCULAR | Status: AC | PRN
  Administered 2020-09-12: 80 mL via INTRAVENOUS

## 2020-09-15 ENCOUNTER — Other Ambulatory Visit: Payer: Self-pay | Admitting: Oncology

## 2020-09-16 ENCOUNTER — Inpatient Hospital Stay (HOSPITAL_BASED_OUTPATIENT_CLINIC_OR_DEPARTMENT_OTHER): Payer: Medicaid Other | Admitting: Nurse Practitioner

## 2020-09-16 ENCOUNTER — Inpatient Hospital Stay: Payer: Medicaid Other

## 2020-09-16 ENCOUNTER — Other Ambulatory Visit: Payer: Self-pay | Admitting: *Deleted

## 2020-09-16 ENCOUNTER — Other Ambulatory Visit: Payer: Self-pay

## 2020-09-16 VITALS — BP 156/61 | HR 93 | Temp 97.5°F | Resp 18 | Wt 125.0 lb

## 2020-09-16 DIAGNOSIS — C259 Malignant neoplasm of pancreas, unspecified: Secondary | ICD-10-CM

## 2020-09-16 DIAGNOSIS — Z5111 Encounter for antineoplastic chemotherapy: Secondary | ICD-10-CM | POA: Diagnosis not present

## 2020-09-16 DIAGNOSIS — C252 Malignant neoplasm of tail of pancreas: Secondary | ICD-10-CM

## 2020-09-16 LAB — CBC WITH DIFFERENTIAL (CANCER CENTER ONLY)
Abs Immature Granulocytes: 0.44 10*3/uL — ABNORMAL HIGH (ref 0.00–0.07)
Basophils Absolute: 0.1 10*3/uL (ref 0.0–0.1)
Basophils Relative: 1 %
Eosinophils Absolute: 0.1 10*3/uL (ref 0.0–0.5)
Eosinophils Relative: 1 %
HCT: 35.2 % — ABNORMAL LOW (ref 36.0–46.0)
Hemoglobin: 11.3 g/dL — ABNORMAL LOW (ref 12.0–15.0)
Immature Granulocytes: 5 %
Lymphocytes Relative: 29 %
Lymphs Abs: 2.8 10*3/uL (ref 0.7–4.0)
MCH: 28.3 pg (ref 26.0–34.0)
MCHC: 32.1 g/dL (ref 30.0–36.0)
MCV: 88.2 fL (ref 80.0–100.0)
Monocytes Absolute: 1.4 10*3/uL — ABNORMAL HIGH (ref 0.1–1.0)
Monocytes Relative: 15 %
Neutro Abs: 5 10*3/uL (ref 1.7–7.7)
Neutrophils Relative %: 49 %
Platelet Count: 217 10*3/uL (ref 150–400)
RBC: 3.99 MIL/uL (ref 3.87–5.11)
RDW: 17.2 % — ABNORMAL HIGH (ref 11.5–15.5)
WBC Count: 9.8 10*3/uL (ref 4.0–10.5)
nRBC: 0.2 % (ref 0.0–0.2)

## 2020-09-16 LAB — CMP (CANCER CENTER ONLY)
ALT: 47 U/L — ABNORMAL HIGH (ref 0–44)
AST: 88 U/L — ABNORMAL HIGH (ref 15–41)
Albumin: 3.5 g/dL (ref 3.5–5.0)
Alkaline Phosphatase: 547 U/L — ABNORMAL HIGH (ref 38–126)
Anion gap: 7 (ref 5–15)
BUN: <5 mg/dL — ABNORMAL LOW (ref 8–23)
CO2: 28 mmol/L (ref 22–32)
Calcium: 9.2 mg/dL (ref 8.9–10.3)
Chloride: 100 mmol/L (ref 98–111)
Creatinine: 0.74 mg/dL (ref 0.44–1.00)
GFR, Estimated: >60 mL/min (ref >60)
Glucose, Bld: 97 mg/dL (ref 70–99)
Potassium: 3.9 mmol/L (ref 3.5–5.1)
Sodium: 135 mmol/L (ref 135–145)
Total Bilirubin: 0.4 mg/dL (ref 0.3–1.2)
Total Protein: 6.9 g/dL (ref 6.5–8.1)

## 2020-09-16 MED ORDER — LEUCOVORIN CALCIUM INJECTION 350 MG
400.0000 mg/m2 | Freq: Once | INTRAMUSCULAR | Status: AC
  Administered 2020-09-16: 644 mg via INTRAVENOUS
  Filled 2020-09-16: qty 32.2

## 2020-09-16 MED ORDER — SODIUM CHLORIDE 0.9 % IV SOLN
125.0000 mg/m2 | Freq: Once | INTRAVENOUS | Status: AC
  Administered 2020-09-16: 200 mg via INTRAVENOUS
  Filled 2020-09-16: qty 10

## 2020-09-16 MED ORDER — DEXTROSE 5 % IV SOLN
Freq: Once | INTRAVENOUS | Status: AC
  Filled 2020-09-16: qty 250

## 2020-09-16 MED ORDER — SODIUM CHLORIDE 0.9 % IV SOLN
10.0000 mg | Freq: Once | INTRAVENOUS | Status: AC
  Administered 2020-09-16: 10 mg via INTRAVENOUS
  Filled 2020-09-16: qty 10

## 2020-09-16 MED ORDER — ATROPINE SULFATE 1 MG/ML IJ SOLN
0.5000 mg | Freq: Once | INTRAMUSCULAR | Status: DC | PRN
  Filled 2020-09-16: qty 1

## 2020-09-16 MED ORDER — SODIUM CHLORIDE 0.9 % IV SOLN
2400.0000 mg/m2 | INTRAVENOUS | Status: DC
  Administered 2020-09-16: 3850 mg via INTRAVENOUS
  Filled 2020-09-16: qty 77

## 2020-09-16 MED ORDER — PALONOSETRON HCL INJECTION 0.25 MG/5ML
0.2500 mg | Freq: Once | INTRAVENOUS | Status: AC
  Administered 2020-09-16: 0.25 mg via INTRAVENOUS
  Filled 2020-09-16: qty 5

## 2020-09-16 MED ORDER — SODIUM CHLORIDE 0.9 % IV SOLN
150.0000 mg | Freq: Once | INTRAVENOUS | Status: AC
  Administered 2020-09-16: 150 mg via INTRAVENOUS
  Filled 2020-09-16: qty 150

## 2020-09-16 NOTE — Progress Notes (Signed)
Per Leander Rams, NP ok to treat with AST 88.

## 2020-09-16 NOTE — Patient Instructions (Signed)
[facility]  Discharge Instructions: Thank you for choosing [facility] to provide your oncology and hematology care.   If you have a lab appointment with the [facility], please go directly to the [facility] and check in at the registration area.   Wear comfortable clothing and clothing appropriate for easy access to any Portacath or PICC line.   We strive to give you quality time with your provider. You may need to reschedule your appointment if you arrive late (15 or more minutes).  Arriving late affects you and other patients whose appointments are after yours.  Also, if you miss three or more appointments without notifying the office, you may be dismissed from the clinic at the provider's discretion.      For prescription refill requests, have your pharmacy contact our office and allow 72 hours for refills to be completed.    Today you received the following chemotherapy and/or immunotherapy agents  irinotecan, leucovorin, fluorouracil   To help prevent nausea and vomiting after your treatment, we encourage you to take your nausea medication as directed.  BELOW ARE SYMPTOMS THAT SHOULD BE REPORTED IMMEDIATELY: *FEVER GREATER THAN 100.4 F (38 C) OR HIGHER *CHILLS OR SWEATING *NAUSEA AND VOMITING THAT IS NOT CONTROLLED WITH YOUR NAUSEA MEDICATION *UNUSUAL SHORTNESS OF BREATH *UNUSUAL BRUISING OR BLEEDING *URINARY PROBLEMS (pain or burning when urinating, or frequent urination) *BOWEL PROBLEMS (unusual diarrhea, constipation, pain near the anus) TENDERNESS IN MOUTH AND THROAT WITH OR WITHOUT PRESENCE OF ULCERS (sore throat, sores in mouth, or a toothache) UNUSUAL RASH, SWELLING OR PAIN  UNUSUAL VAGINAL DISCHARGE OR ITCHING   Items with * indicate a potential emergency and should be followed up as soon as possible or go to the Emergency Department if any problems should occur.  Please show the CHEMOTHERAPY ALERT CARD or IMMUNOTHERAPY ALERT  CARD at check-in to the Emergency Department and triage nurse.  Should you have questions after your visit or need to cancel or reschedule your appointment, please contact [facility]  Dept: [phone]  and follow the prompts.  Office hours are 8:00 a.m. to 4:30 p.m. Monday - Friday. Please note that voicemails left after 4:00 p.m. may not be returned until the following business day.  We are closed weekends and major holidays. You have access to a nurse at all times for urgent questions. Please call the main number to the clinic Dept: [phone] and follow the prompts.   For any non-urgent questions, you may also contact your provider using MyChart. We now offer e-Visits for anyone 23 and older to request care online for non-urgent symptoms. For details visit [email or web address].   Also download the MyChart app! Go to the app store, search "MyChart", open the app, select [facility], and log in with your MyChart username and password.  Due to Covid, a mask is required upon entering the hospital/clinic. If you do not have a mask, one will be given to you upon arrival. For doctor visits, patients may have 1 support person aged 66 or older with them. For treatment visits, patients cannot have anyone with them due to current Covid guidelines and our immunocompromised population.   Irinotecan injection What is this medication? IRINOTECAN (ir in oh TEE kan ) is a chemotherapy drug. It is used to treatcolon and rectal cancer. This medicine may be used for other purposes; ask your health care provider orpharmacist if you have questions. COMMON BRAND NAME(S): Camptosar What should  I tell my care team before I take this medication? They need to know if you have any of these conditions: dehydration diarrhea infection (especially a virus infection such as chickenpox, cold sores, or herpes) liver disease low blood counts, like low white cell, platelet, or red cell  counts low levels of calcium, magnesium, or potassium in the blood recent or ongoing radiation therapy an unusual or allergic reaction to irinotecan, other medicines, foods, dyes, or preservatives pregnant or trying to get pregnant breast-feeding How should I use this medication? This drug is given as an infusion into a vein. It is administered in a hospitalor clinic by a specially trained health care professional. Talk to your pediatrician regarding the use of this medicine in children.Special care may be needed. Overdosage: If you think you have taken too much of this medicine contact apoison control center or emergency room at once. NOTE: This medicine is only for you. Do not share this medicine with others. What if I miss a dose? It is important not to miss your dose. Call your doctor or health careprofessional if you are unable to keep an appointment. What may interact with this medication? Do not take this medicine with any of the following medications: cobicistat itraconazole This medicine may interact with the following medications: antiviral medicines for HIV or AIDS certain antibiotics like rifampin or rifabutin certain medicines for fungal infections like ketoconazole, posaconazole, and voriconazole certain medicines for seizures like carbamazepine, phenobarbital, phenotoin clarithromycin gemfibrozil nefazodone [facility]. John's Wort This list may not describe all possible interactions. Give your health care provider a list of all the medicines, herbs, non-prescription drugs, or dietary supplements you use. Also tell them if you smoke, drink alcohol, or use illegaldrugs. Some items may interact with your medicine. What should I watch for while using this medication? Your condition will be monitored carefully while you are receiving this medicine. You will need important blood work done while you are taking thismedicine. This drug may make you feel generally unwell. This is not  uncommon, as chemotherapy can affect healthy cells as well as cancer cells. Report any side effects. Continue your course of treatment even though you feel ill unless yourdoctor tells you to stop. In some cases, you may be given additional medicines to help with side effects.Follow all directions for their use. You may get drowsy or dizzy. Do not drive, use machinery, or do anything that needs mental alertness until you know how this medicine affects you. Do not stand or sit up quickly, especially if you are an older patient. This reducesthe risk of dizzy or fainting spells. Call your health care professional for advice if you get a fever, chills, or sore throat, or other symptoms of a cold or flu. Do not treat yourself. This medicine decreases your body's ability to fight infections. Try to avoid beingaround people who are sick. Avoid taking products that contain aspirin, acetaminophen, ibuprofen, naproxen, or ketoprofen unless instructed by your doctor. These medicines may hide afever. This medicine may increase your risk to bruise or bleed. Call your doctor orhealth care professional if you notice any unusual bleeding. Be careful brushing and flossing your teeth or using a toothpick because you may get an infection or bleed more easily. If you have any dental work done,tell your dentist you are receiving this medicine. Do not become pregnant while taking this medicine or for 6 months after stopping it. Women should inform their health care professional if they wish to become pregnant or think they  might be pregnant. Men should not father a child while taking this medicine and for 3 months after stopping it. There is potential for serious side effects to an unborn child. Talk to your health careprofessional for more information. Do not breast-feed an infant while taking this medicine or for 7 days afterstopping it. This medicine has caused ovarian failure in some women. This medicine may make it more  difficult to get pregnant. Talk to your health care professional if [place] concerned about your fertility. This medicine has caused decreased sperm counts in some men. This may make it more difficult to father a child. Talk to your health care professional if [place] concerned about your fertility. What side effects may I notice from receiving this medication? Side effects that you should report to your doctor or health care professionalas soon as possible: allergic reactions like skin rash, itching or hives, swelling of the face, lips, or tongue chest pain diarrhea flushing, runny nose, sweating during infusion low blood counts - this medicine may decrease the number of white blood cells, red blood cells and platelets. You may be at increased risk for infections and bleeding. nausea, vomiting pain, swelling, warmth in the leg signs of decreased platelets or bleeding - bruising, pinpoint red spots on the skin, black, tarry stools, blood in the urine signs of infection - fever or chills, cough, sore throat, pain or difficulty passing urine signs of decreased red blood cells - unusually weak or tired, fainting spells, lightheadedness Side effects that usually do not require medical attention (report to yourdoctor or health care professional if they continue or are bothersome): constipation hair loss headache loss of appetite mouth sores stomach pain This list may not describe all possible side effects. Call your doctor for medical advice about side effects. You may report side effects to FDA at1-800-FDA-1088. Where should I keep my medication? This drug is given in a hospital or clinic and will not be stored at home. NOTE: This sheet is a summary. It may not cover all possible information. If you have questions about this medicine, talk to your doctor, pharmacist, orhealth care provider.  2022 Elsevier/Gold Standard (2019-01-17 17:46:13)  Leucovorin injection What is this  medication? LEUCOVORIN (loo koe VOR in) is used to prevent or treat the harmful effects of some medicines. This medicine is used to treat anemia caused by a low amount of folic acid in the body. It is also used with 5-fluorouracil (5-FU) to treatcolon cancer. This medicine may be used for other purposes; ask your health care provider orpharmacist if you have questions. What should I tell my care team before I take this medication? They need to know if you have any of these conditions: anemia from low levels of vitamin B-12 in the blood an unusual or allergic reaction to leucovorin, folic acid, other medicines, foods, dyes, or preservatives pregnant or trying to get pregnant breast-feeding How should I use this medication? This medicine is for injection into a muscle or into a vein. It is given by ahealth care professional in a hospital or clinic setting. Talk to your pediatrician regarding the use of this medicine in children.Special care may be needed. Overdosage: If you think you have taken too much of this medicine contact apoison control center or emergency room at once. NOTE: This medicine is only for you. Do not share this medicine with others. What if I miss a dose? This does not apply. What may interact with this medication? capecitabine fluorouracil phenobarbital phenytoin primidone trimethoprim-sulfamethoxazole  This list may not describe all possible interactions. Give your health care provider a list of all the medicines, herbs, non-prescription drugs, or dietary supplements you use. Also tell them if you smoke, drink alcohol, or use illegaldrugs. Some items may interact with your medicine. What should I watch for while using this medication? Your condition will be monitored carefully while you are receiving thismedicine. This medicine may increase the side effects of 5-fluorouracil, 5-FU. Tell your doctor or health care professional if you have diarrhea or mouth sores that donot  get better or that get worse. What side effects may I notice from receiving this medication? Side effects that you should report to your doctor or health care professionalas soon as possible: allergic reactions like skin rash, itching or hives, swelling of the face, lips, or tongue breathing problems fever, infection mouth sores unusual bleeding or bruising unusually weak or tired Side effects that usually do not require medical attention (report to yourdoctor or health care professional if they continue or are bothersome): constipation or diarrhea loss of appetite nausea, vomiting This list may not describe all possible side effects. Call your doctor for medical advice about side effects. You may report side effects to FDA at1-800-FDA-1088. Where should I keep my medication? This drug is given in a hospital or clinic and will not be stored at home. NOTE: This sheet is a summary. It may not cover all possible information. If you have questions about this medicine, talk to your doctor, pharmacist, orhealth care provider.  2022 Elsevier/Gold Standard (2007-08-23 16:50:29)  Fluorouracil, 5-FU injection What is this medication? FLUOROURACIL, 5-FU (flure oh YOOR a sil) is a chemotherapy drug. It slows the growth of cancer cells. This medicine is used to treat many types of cancer like breast cancer, colon or rectal cancer, pancreatic cancer, and stomachcancer. This medicine may be used for other purposes; ask your health care provider orpharmacist if you have questions. COMMON BRAND NAME(S): Adrucil What should I tell my care team before I take this medication? They need to know if you have any of these conditions: blood disorders dihydropyrimidine dehydrogenase (DPD) deficiency infection (especially a virus infection such as chickenpox, cold sores, or herpes) kidney disease liver disease malnourished, poor nutrition recent or ongoing radiation therapy an unusual or allergic reaction to  fluorouracil, other chemotherapy, other medicines, foods, dyes, or preservatives pregnant or trying to get pregnant breast-feeding How should I use this medication? This drug is given as an infusion or injection into a vein. It is administeredin a hospital or clinic by a specially trained health care professional. Talk to your pediatrician regarding the use of this medicine in children.Special care may be needed. Overdosage: If you think you have taken too much of this medicine contact apoison control center or emergency room at once. NOTE: This medicine is only for you. Do not share this medicine with others. What if I miss a dose? It is important not to miss your dose. Call your doctor or health careprofessional if you are unable to keep an appointment. What may interact with this medication? Do not take this medicine with any of the following medications: live virus vaccines This medicine may also interact with the following medications: medicines that treat or prevent blood clots like warfarin, enoxaparin, and dalteparin This list may not describe all possible interactions. Give your health care provider a list of all the medicines, herbs, non-prescription drugs, or dietary supplements you use. Also tell them if you smoke, drink alcohol, or use illegaldrugs.  Some items may interact with your medicine. What should I watch for while using this medication? Visit your doctor for checks on your progress. This drug may make you feel generally unwell. This is not uncommon, as chemotherapy can affect healthy cells as well as cancer cells. Report any side effects. Continue your course oftreatment even though you feel ill unless your doctor tells you to stop. In some cases, you may be given additional medicines to help with side effects.Follow all directions for their use. Call your doctor or health care professional for advice if you get a fever, chills or sore throat, or other symptoms of a cold or  flu. Do not treat yourself. This drug decreases your body's ability to fight infections. Try toavoid being around people who are sick. This medicine may increase your risk to bruise or bleed. Call your doctor orhealth care professional if you notice any unusual bleeding. Be careful brushing and flossing your teeth or using a toothpick because you may get an infection or bleed more easily. If you have any dental work done,tell your dentist you are receiving this medicine. Avoid taking products that contain aspirin, acetaminophen, ibuprofen, naproxen, or ketoprofen unless instructed by your doctor. These medicines may hide afever. Do not become pregnant while taking this medicine. Women should inform their doctor if they wish to become pregnant or think they might be pregnant. There is a potential for serious side effects to an unborn child. Talk to your health care professional or pharmacist for more information. Do not breast-feed aninfant while taking this medicine. Men should inform their doctor if they wish to father a child. This medicinemay lower sperm counts. Do not treat diarrhea with over the counter products. Contact your doctor ifyou have diarrhea that lasts more than 2 days or if it is severe and watery. This medicine can make you more sensitive to the sun. Keep out of the sun. If you cannot avoid being in the sun, wear protective clothing and use sunscreen.Do not use sun lamps or tanning beds/booths. What side effects may I notice from receiving this medication? Side effects that you should report to your doctor or health care professionalas soon as possible: allergic reactions like skin rash, itching or hives, swelling of the face, lips, or tongue low blood counts - this medicine may decrease the number of white blood cells, red blood cells and platelets. You may be at increased risk for infections and bleeding. signs of infection - fever or chills, cough, sore throat, pain or difficulty  passing urine signs of decreased platelets or bleeding - bruising, pinpoint red spots on the skin, black, tarry stools, blood in the urine signs of decreased red blood cells - unusually weak or tired, fainting spells, lightheadedness breathing problems changes in vision chest pain mouth sores nausea and vomiting pain, swelling, redness at site where injected pain, tingling, numbness in the hands or feet redness, swelling, or sores on hands or feet stomach pain unusual bleeding Side effects that usually do not require medical attention (report to yourdoctor or health care professional if they continue or are bothersome): changes in finger or toe nails diarrhea dry or itchy skin hair loss headache loss of appetite sensitivity of eyes to the light stomach upset unusually teary eyes This list may not describe all possible side effects. Call your doctor for medical advice about side effects. You may report side effects to FDA at1-800-FDA-1088. Where should I keep my medication? This drug is given in a hospital or clinic  and will not be stored at home. NOTE: This sheet is a summary. It may not cover all possible information. If you have questions about this medicine, talk to your doctor, pharmacist, orhealth care provider.  2022 Elsevier/Gold Standard (2019-01-17 15:00:03)

## 2020-09-16 NOTE — Progress Notes (Signed)
  [facility] OFFICE PROGRESS NOTE   Diagnosis:  Pancreas cancer  INTERVAL HISTORY:   Kirsten [name] returns as scheduled. She completed cycle 5 FOLFIRINOX on 09/03/2020. She denies nausea/vomiting after chemotherapy. No mouth sores. Diarrhea, 2-3 per day, for 3 days. Cold sensitivity lasted 5 days. Has persistent numbness/tingling in the fingertips. Notes difficulty buttoning clothes. Abdominal pain continues to be improved.   Objective:  Vital signs in last 24 hours:  Blood pressure (!) 156/61, pulse 93, temperature (!) 97.5 F (36.4 C), temperature source Temporal, resp. rate 18, weight 125 lb (56.7 kg), SpO2 100 %.    HEENT: no thrush or ulcers.  Resp: lungs clear. Cardio: regular rate and rhythm.  GI: abdomen soft and nontender. No hepatomegaly. Vascular: no leg edema. Neuro: vibratory sense moderately decreased over the fingertips per tuning fork exam.  Skin: palms without erythema. Portacath without erythema.    Lab Results:  Lab Results  Component Value Date   WBC 9.8 09/16/2020   HGB 11.3 (L) 09/16/2020   HCT 35.2 (L) 09/16/2020   MCV 88.2 09/16/2020   PLT 217 09/16/2020   NEUTROABS 5.0 09/16/2020    Imaging:  No results found.  Medications: I have reviewed the patient's current medications.  Assessment/Plan:  1. Metastatic pancreas cancer Right upper quadrant ultrasound 06/17/2020-multiple hypoechoic liver lesions CT abdomen/pelvis 06/17/2020- pancreas tail mass, multiple liver metastases, enlarged periaortic nodes, right ovary metastasis?,  Nodularity at the left paracolic gutter and posterior/inferior stomach suggesting peritoneal tumor spread CT chest 06/17/2020- scattered small pulmonary nodules consistent with metastatic disease Ultrasound-guided biopsy of a left liver mass 06/18/2020- adenocarcinoma, morphology compatible with metastatic pancreatic adenocarcinoma Cycle 1 FOLFOX 07/01/2020 Cycle 2 FOLFOX 07/16/2020 Cycle 3 FOLFOX 07/31/2020 Cycle  4 FOLFIRINOX 08/13/2020 Cycle 5 FOLFIRINOX 09/03/2020, Udenyca CTs 09/13/2020-unchanged nodule medial left lower lobe; stable nodal and likely peritoneal disease in the abdomen/pelvis; unchanged pancreatic tail mass; multifocal hepatic metastases, mildly progressive Cycle 6 FOLFIRINOX 09/16/2020, Oxaliplatin held 2.  Pain secondary to #1 3.  Weight loss 4.  Family history of pancreas cancer 5.  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 Huettner appears stable. She has completed 5 cycles of systemic therapy. Clinically she has improved. Pain is better. CA 19.9 is better. Restaging CTs with overall stable disease. Plan to proceed with treatment today as scheduled. Oxaliplatin will be held due to neuropathy.   CBC from today reviewed. Counts adequate to proceed with treatment.   She will return for follow-up in 2 weeks.   Patient seen with Dr. Benay Spice.    Ned Card ANP/GNP-BC   09/16/2020  9:22 AM This was a shared visit with Ned Card.  We reviewed the CT results with Ms. Novakovich.  I reviewed the CT images.  There is been slight enlargement of multiple liver lesions.  Her performance status has improved since beginning chemotherapy and the CA 19-9 was lower last month.  There are no new sites of metastatic disease.  We decided to continue treatment with FOLFIRINOX.  We will follow her clinical status and CA 19-9 closely.  We will plan for repeat imaging in 2 months.  I was present for greater than 50% of today's visit.  I performed medical decision making.  [name], MD

## 2020-09-17 LAB — CANCER ANTIGEN 19-9: CA 19-9: 35803 U/mL — ABNORMAL HIGH (ref 0–35)

## 2020-09-18 ENCOUNTER — Other Ambulatory Visit: Payer: Self-pay

## 2020-09-18 ENCOUNTER — Inpatient Hospital Stay: Payer: Medicaid Other

## 2020-09-18 VITALS — BP 155/98 | HR 84 | Temp 98.2°F | Resp 18

## 2020-09-18 DIAGNOSIS — Z5111 Encounter for antineoplastic chemotherapy: Secondary | ICD-10-CM | POA: Diagnosis not present

## 2020-09-18 DIAGNOSIS — C252 Malignant neoplasm of tail of pancreas: Secondary | ICD-10-CM

## 2020-09-18 MED ORDER — PEGFILGRASTIM-BMEZ 6 MG/0.6ML ~~LOC~~ SOSY
6.0000 mg | PREFILLED_SYRINGE | Freq: Once | SUBCUTANEOUS | Status: AC
  Administered 2020-09-18: 6 mg via SUBCUTANEOUS

## 2020-09-18 MED ORDER — HEPARIN SOD (PORK) LOCK FLUSH 100 UNIT/ML IV SOLN
500.0000 [IU] | Freq: Once | INTRAVENOUS | Status: AC | PRN
  Administered 2020-09-18: 500 [IU]
  Filled 2020-09-18: qty 5

## 2020-09-18 MED ORDER — SODIUM CHLORIDE 0.9% FLUSH
10.0000 mL | INTRAVENOUS | Status: DC | PRN
  Administered 2020-09-18: 10 mL
  Filled 2020-09-18: qty 10

## 2020-09-18 MED ORDER — PEGFILGRASTIM-CBQV 6 MG/0.6ML ~~LOC~~ SOSY: 6.0000 mg | PREFILLED_SYRINGE | Freq: Once | SUBCUTANEOUS | Status: DC

## 2020-09-18 NOTE — Progress Notes (Addendum)
No auth required for Ziextenzo. Udenyca changed to [employer] per insurance preference per PA team.  Acquanetta Belling, RPH, BCPS, BCOP 09/18/2020 12:14 PM

## 2020-09-18 NOTE — Patient Instructions (Addendum)
[facility]  Discharge Instructions: Thank you for choosing [facility] to provide your oncology and hematology care.   If you have a lab appointment with the [facility], please go directly to the [facility] and check in at the registration area.   Wear comfortable clothing and clothing appropriate for easy access to any Portacath or PICC line.   We strive to give you quality time with your provider. You may need to reschedule your appointment if you arrive late (15 or more minutes).  Arriving late affects you and other patients whose appointments are after yours.  Also, if you miss three or more appointments without notifying the office, you may be dismissed from the clinic at the provider's discretion.      For prescription refill requests, have your pharmacy contact our office and allow 72 hours for refills to be completed.    Today you received the following chemotherapy and/or immunotherapy agents Ziextenzo      To help prevent nausea and vomiting after your treatment, we encourage you to take your nausea medication as directed.  BELOW ARE SYMPTOMS THAT SHOULD BE REPORTED IMMEDIATELY: *FEVER GREATER THAN 100.4 F (38 C) OR HIGHER *CHILLS OR SWEATING *NAUSEA AND VOMITING THAT IS NOT CONTROLLED WITH YOUR NAUSEA MEDICATION *UNUSUAL SHORTNESS OF BREATH *UNUSUAL BRUISING OR BLEEDING *URINARY PROBLEMS (pain or burning when urinating, or frequent urination) *BOWEL PROBLEMS (unusual diarrhea, constipation, pain near the anus) TENDERNESS IN MOUTH AND THROAT WITH OR WITHOUT PRESENCE OF ULCERS (sore throat, sores in mouth, or a toothache) UNUSUAL RASH, SWELLING OR PAIN  UNUSUAL VAGINAL DISCHARGE OR ITCHING   Items with * indicate a potential emergency and should be followed up as soon as possible or go to the Emergency Department if any problems should occur.  Please show the CHEMOTHERAPY ALERT CARD or IMMUNOTHERAPY ALERT CARD at check-in to the  Emergency Department and triage nurse.  Should you have questions after your visit or need to cancel or reschedule your appointment, please contact [facility]  Dept: [phone]  and follow the prompts.  Office hours are 8:00 a.m. to 4:30 p.m. Monday - Friday. Please note that voicemails left after 4:00 p.m. may not be returned until the following business day.  We are closed weekends and major holidays. You have access to a nurse at all times for urgent questions. Please call the main number to the clinic Dept: [phone] and follow the prompts.   For any non-urgent questions, you may also contact your provider using MyChart. We now offer e-Visits for anyone 12 and older to request care online for non-urgent symptoms. For details visit [email or web address].   Also download the MyChart app! Go to the app store, search "MyChart", open the app, select [facility], and log in with your MyChart username and password.  Due to Covid, a mask is required upon entering the hospital/clinic. If you do not have a mask, one will be given to you upon arrival. For doctor visits, patients may have 1 support person aged 107 or older with them. For treatment visits, patients cannot have anyone with them due to current Covid guidelines and our immunocompromised population.   Pegfilgrastim injection What is this medication? PEGFILGRASTIM (PEG fil gra stim) is a long-acting granulocyte colony-stimulating factor that stimulates the growth of neutrophils, a type of white blood cell important in the body's fight against infection. It is used to reduce the incidence of fever and infection in patients  with certain types of cancer who are receiving chemotherapy that affects the bone marrow, and toincrease survival after being exposed to high doses of radiation. This medicine may be used for other purposes; ask your health care provider orpharmacist if you have questions. COMMON BRAND NAME(S):  Rexene Edison, Ziextenzo What should I tell my care team before I take this medication? They need to know if you have any of these conditions: kidney disease latex allergy ongoing radiation therapy sickle cell disease skin reactions to acrylic adhesives (On-Body Injector only) an unusual or allergic reaction to pegfilgrastim, filgrastim, other medicines, foods, dyes, or preservatives pregnant or trying to get pregnant breast-feeding How should I use this medication? This medicine is for injection under the skin. If you get this medicine at home, you will be taught how to prepare and give the pre-filled syringe or how to use the On-body Injector. Refer to the patient Instructions for Use for detailed instructions. Use exactly as directed. Tell your healthcare provider immediately if you suspect that the On-body Injector may not have performed as intended or if you suspect the use of the On-body Injector resulted in a missedor partial dose. It is important that you put your used needles and syringes in a special sharps container. Do not put them in a trash can. If you do not have a sharpscontainer, call your pharmacist or healthcare provider to get one. Talk to your pediatrician regarding the use of this medicine in children. Whilethis drug may be prescribed for selected conditions, precautions do apply. Overdosage: If you think you have taken too much of this medicine contact apoison control center or emergency room at once. NOTE: This medicine is only for you. Do not share this medicine with others. What if I miss a dose? It is important not to miss your dose. Call your doctor or health care professional if you miss your dose. If you miss a dose due to an On-body Injector failure or leakage, a new dose should be administered as soon aspossible using a single prefilled syringe for manual use. What may interact with this medication? Interactions have not been studied. This  list may not describe all possible interactions. Give your health care provider a list of all the medicines, herbs, non-prescription drugs, or dietary supplements you use. Also tell them if you smoke, drink alcohol, or use illegaldrugs. Some items may interact with your medicine. What should I watch for while using this medication? Your condition will be monitored carefully while you are receiving thismedicine. You may need blood work done while you are taking this medicine. Talk to your health care provider about your risk of cancer. You may be more atrisk for certain types of cancer if you take this medicine. If you are going to need a MRI, CT scan, or other procedure, tell your doctorthat you are using this medicine (On-Body Injector only). What side effects may I notice from receiving this medication? Side effects that you should report to your doctor or health care professionalas soon as possible: allergic reactions (skin rash, itching or hives, swelling of the face, lips, or tongue) back pain dizziness fever pain, redness, or irritation at site where injected pinpoint red spots on the skin red or dark-brown urine shortness of breath or breathing problems stomach or side pain, or pain at the shoulder swelling tiredness trouble passing urine or change in the amount of urine unusual bruising or bleeding Side effects that usually do not require medical attention (report to yourdoctor  or health care professional if they continue or are bothersome): bone pain muscle pain This list may not describe all possible side effects. Call your doctor for medical advice about side effects. You may report side effects to FDA at1-800-FDA-1088. Where should I keep my medication? Keep out of the reach of children. If you are using this medicine at home, you will be instructed on how to storeit. Throw away any unused medicine after the expiration date on the label. NOTE: This sheet is a summary. It may  not cover all possible information. If you have questions about this medicine, talk to your doctor, pharmacist, orhealth care provider.  2022 Elsevier/Gold Standard (2020-03-15 11:54:14)

## 2020-09-27 ENCOUNTER — Other Ambulatory Visit: Payer: Self-pay | Admitting: Oncology

## 2020-09-30 ENCOUNTER — Encounter: Payer: Self-pay | Admitting: Nurse Practitioner

## 2020-09-30 ENCOUNTER — Inpatient Hospital Stay: Payer: Medicaid Other

## 2020-09-30 ENCOUNTER — Other Ambulatory Visit: Payer: Self-pay

## 2020-09-30 ENCOUNTER — Inpatient Hospital Stay (HOSPITAL_BASED_OUTPATIENT_CLINIC_OR_DEPARTMENT_OTHER): Payer: Medicaid Other | Admitting: Nurse Practitioner

## 2020-09-30 ENCOUNTER — Inpatient Hospital Stay: Payer: Medicaid Other | Attending: Oncology

## 2020-09-30 VITALS — BP 153/87 | HR 93 | Temp 97.8°F | Resp 20 | Ht 63.0 in | Wt 123.6 lb

## 2020-09-30 DIAGNOSIS — C787 Secondary malignant neoplasm of liver and intrahepatic bile duct: Secondary | ICD-10-CM | POA: Insufficient documentation

## 2020-09-30 DIAGNOSIS — C252 Malignant neoplasm of tail of pancreas: Secondary | ICD-10-CM

## 2020-09-30 DIAGNOSIS — Z5111 Encounter for antineoplastic chemotherapy: Secondary | ICD-10-CM | POA: Diagnosis present

## 2020-09-30 DIAGNOSIS — C7800 Secondary malignant neoplasm of unspecified lung: Secondary | ICD-10-CM | POA: Insufficient documentation

## 2020-09-30 DIAGNOSIS — D701 Agranulocytosis secondary to cancer chemotherapy: Secondary | ICD-10-CM | POA: Diagnosis not present

## 2020-09-30 LAB — CMP (CANCER CENTER ONLY)
ALT: 55 U/L — ABNORMAL HIGH (ref 0–44)
AST: 55 U/L — ABNORMAL HIGH (ref 15–41)
Albumin: 3.5 g/dL (ref 3.5–5.0)
Alkaline Phosphatase: 460 U/L — ABNORMAL HIGH (ref 38–126)
Anion gap: 9 (ref 5–15)
BUN: <5 mg/dL — ABNORMAL LOW (ref 8–23)
CO2: 25 mmol/L (ref 22–32)
Calcium: 9.3 mg/dL (ref 8.9–10.3)
Chloride: 106 mmol/L (ref 98–111)
Creatinine: 0.76 mg/dL (ref 0.44–1.00)
GFR, Estimated: >60 mL/min (ref >60)
Glucose, Bld: 122 mg/dL — ABNORMAL HIGH (ref 70–99)
Potassium: 3.3 mmol/L — ABNORMAL LOW (ref 3.5–5.1)
Sodium: 140 mmol/L (ref 135–145)
Total Bilirubin: 0.4 mg/dL (ref 0.3–1.2)
Total Protein: 6.8 g/dL (ref 6.5–8.1)

## 2020-09-30 LAB — CBC WITH DIFFERENTIAL (CANCER CENTER ONLY)
Abs Immature Granulocytes: 0.07 10*3/uL (ref 0.00–0.07)
Basophils Absolute: 0.1 10*3/uL (ref 0.0–0.1)
Basophils Relative: 2 %
Eosinophils Absolute: 0.2 10*3/uL (ref 0.0–0.5)
Eosinophils Relative: 4 %
HCT: 35.7 % — ABNORMAL LOW (ref 36.0–46.0)
Hemoglobin: 11.2 g/dL — ABNORMAL LOW (ref 12.0–15.0)
Immature Granulocytes: 1 %
Lymphocytes Relative: 35 %
Lymphs Abs: 2.3 10*3/uL (ref 0.7–4.0)
MCH: 28.3 pg (ref 26.0–34.0)
MCHC: 31.4 g/dL (ref 30.0–36.0)
MCV: 90.2 fL (ref 80.0–100.0)
Monocytes Absolute: 1.1 10*3/uL — ABNORMAL HIGH (ref 0.1–1.0)
Monocytes Relative: 17 %
Neutro Abs: 2.8 10*3/uL (ref 1.7–7.7)
Neutrophils Relative %: 41 %
Platelet Count: 260 10*3/uL (ref 150–400)
RBC: 3.96 MIL/uL (ref 3.87–5.11)
RDW: 17.7 % — ABNORMAL HIGH (ref 11.5–15.5)
WBC Count: 6.6 10*3/uL (ref 4.0–10.5)
nRBC: 0 % (ref 0.0–0.2)

## 2020-09-30 MED ORDER — SODIUM CHLORIDE 0.9 % IV SOLN
400.0000 mg/m2 | Freq: Once | INTRAVENOUS | Status: AC
  Administered 2020-09-30: 644 mg via INTRAVENOUS
  Filled 2020-09-30: qty 32.2

## 2020-09-30 MED ORDER — ATROPINE SULFATE 1 MG/ML IJ SOLN
0.5000 mg | Freq: Once | INTRAMUSCULAR | Status: AC | PRN
  Administered 2020-09-30: 0.5 mg via INTRAVENOUS
  Filled 2020-09-30: qty 1

## 2020-09-30 MED ORDER — SODIUM CHLORIDE 0.9 % IV SOLN
10.0000 mg | Freq: Once | INTRAVENOUS | Status: AC
  Administered 2020-09-30: 10 mg via INTRAVENOUS
  Filled 2020-09-30: qty 1

## 2020-09-30 MED ORDER — DEXTROSE 5 % IV SOLN
Freq: Once | INTRAVENOUS | Status: AC
  Filled 2020-09-30: qty 250

## 2020-09-30 MED ORDER — SODIUM CHLORIDE 0.9% FLUSH
10.0000 mL | INTRAVENOUS | Status: DC | PRN
  Administered 2020-09-30: 10 mL
  Filled 2020-09-30: qty 10

## 2020-09-30 MED ORDER — OXALIPLATIN CHEMO INJECTION 100 MG/20ML
85.0000 mg/m2 | Freq: Once | INTRAVENOUS | Status: AC
  Administered 2020-09-30: 135 mg via INTRAVENOUS
  Filled 2020-09-30: qty 20

## 2020-09-30 MED ORDER — SODIUM CHLORIDE 0.9 % IV SOLN
150.0000 mg | Freq: Once | INTRAVENOUS | Status: AC
  Administered 2020-09-30: 150 mg via INTRAVENOUS
  Filled 2020-09-30: qty 5

## 2020-09-30 MED ORDER — SODIUM CHLORIDE 0.9 % IV SOLN
125.0000 mg/m2 | Freq: Once | INTRAVENOUS | Status: AC
  Administered 2020-09-30: 200 mg via INTRAVENOUS
  Filled 2020-09-30: qty 10

## 2020-09-30 MED ORDER — PALONOSETRON HCL INJECTION 0.25 MG/5ML
0.2500 mg | Freq: Once | INTRAVENOUS | Status: AC
Start: 2020-09-30 — End: 2020-09-30
  Administered 2020-09-30: 0.25 mg via INTRAVENOUS
  Filled 2020-09-30: qty 5

## 2020-09-30 MED ORDER — SODIUM CHLORIDE 0.9 % IV SOLN
2400.0000 mg/m2 | INTRAVENOUS | Status: DC
  Administered 2020-09-30: 3850 mg via INTRAVENOUS
  Filled 2020-09-30: qty 77

## 2020-09-30 NOTE — Progress Notes (Signed)
  [facility] no thrush or ulcers. OFFICE PROGRESS NOTE   Diagnosis: Pancreas cancer  INTERVAL HISTORY:   Kirsten [name] returns as scheduled.  She completed cycle 6 FOLFIRINOX 09/16/2020.  Oxaliplatin was held due to neuropathy.  No numbness or tingling in the hands or feet.  No cold sensitivity.  She notes improvement with activity involving use of the fingers.  No nausea or vomiting.  No mouth sores.  Minimal diarrhea.  Abdominal pain continues to be improved.  She estimates wearing each fentanyl patch for 5 to 7 days.  She has taken no breakthrough pain medication for 2 weeks.  Objective:  Vital signs in last 24 hours:  Blood pressure (!) 153/87, pulse 93, temperature 97.8 F (36.6 C), temperature source Oral, resp. rate 20, height '5\' 3"'$  (1.6 m), weight 123 lb 9.6 oz (56.1 kg), SpO2 100 %.    HEENT: No thrush or ulcers. Resp: Lungs clear bilaterally. Cardio: Regular rate and rhythm. GI: Abdomen soft.  Liver edge is palpable, associated tenderness. Vascular: No leg edema. Neuro: Vibratory sense mildly decreased over the fingertips on the right, moderately decreased on the left, per tuning fork exam. Skin: Palms without erythema. Port-A-Cath without erythema.   Lab Results:  Lab Results  Component Value Date   WBC 6.6 09/30/2020   HGB 11.2 (L) 09/30/2020   HCT 35.7 (L) 09/30/2020   MCV 90.2 09/30/2020   PLT 260 09/30/2020   NEUTROABS 2.8 09/30/2020    Imaging:  No results found.  Medications: I have reviewed the patient's current medications.  Assessment/Plan:  1. Metastatic pancreas cancer Right upper quadrant ultrasound 06/17/2020-multiple hypoechoic liver lesions CT abdomen/pelvis 06/17/2020- pancreas tail mass, multiple liver metastases, enlarged periaortic nodes, right ovary metastasis?,  Nodularity at the left paracolic gutter and posterior/inferior stomach suggesting peritoneal tumor spread CT chest 06/17/2020- scattered small pulmonary nodules  consistent with metastatic disease Ultrasound-guided biopsy of a left liver mass 06/18/2020- adenocarcinoma, morphology compatible with metastatic pancreatic adenocarcinoma Cycle 1 FOLFOX 07/01/2020 Cycle 2 FOLFOX 07/16/2020 Cycle 3 FOLFOX 07/31/2020 Cycle 4 FOLFIRINOX 08/13/2020 Cycle 5 FOLFIRINOX 09/03/2020, Udenyca CTs 09/13/2020-unchanged nodule medial left lower lobe; stable nodal and likely peritoneal disease in the abdomen/pelvis; unchanged pancreatic tail mass; multifocal hepatic metastases, mildly progressive Cycle 6 FOLFIRINOX 09/16/2020, Oxaliplatin held Cycle 7 FOLFIRINOX 09/30/2020 2.  Pain secondary to #1 3.  Weight loss 4.  Family history of pancreas cancer 5.  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  She has completed 6 cycles of systemic therapy.  Oxaliplatin was held with cycle 6 due to neuropathy symptoms interfering with activity.  Symptoms are better.  Plan to proceed with cycle 7 today as scheduled, resume Oxaliplatin.  Kirsten [name] agrees with this plan.  We reviewed the CBC from today.  Counts adequate to proceed with treatment.  She will return for lab, follow-up, cycle 7 FOLFIRINOX in 2 weeks.  She will contact the office in the interim with any problems.  Plan reviewed with Dr. Benay Spice.    Ned Card ANP/GNP-BC   09/30/2020  9:35 AM

## 2020-09-30 NOTE — Patient Instructions (Signed)
Implanted Port Home Guide An implanted port is a device that is placed under the skin. It is usually placed in the chest. The device can be used to give IV medicine, to take blood, or for dialysis. You may have an implanted port if: You need IV medicine that would be irritating to the small veins in your hands or arms. You need IV medicines, such as antibiotics, for a long period of time. You need IV nutrition for a long period of time. You need dialysis. When you have a port, your health care provider can choose to use the port instead of veins in your arms for these procedures. You may have fewer limitations when using a port than you would if you used other types of long-term IVs, and you will likely be able to return to normal activities afteryour incision heals. An implanted port has two main parts: Reservoir. The reservoir is the part where a needle is inserted to give medicines or draw blood. The reservoir is round. After it is placed, it appears as a small, raised area under your skin. Catheter. The catheter is a thin, flexible tube that connects the reservoir to a vein. Medicine that is inserted into the reservoir goes into the catheter and then into the vein. How is my port accessed? To access your port: A numbing cream may be placed on the skin over the port site. Your health care provider will put on a mask and sterile gloves. The skin over your port will be cleaned carefully with a germ-killing soap and allowed to dry. Your health care provider will gently pinch the port and insert a needle into it. Your health care provider will check for a blood return to make sure the port is in the vein and is not clogged. If your port needs to remain accessed to get medicine continuously (constant infusion), your health care provider will place a clear bandage (dressing) over the needle site. The dressing and needle will need to be changed every week, or as told by your health care provider. What  is flushing? Flushing helps keep the port from getting clogged. Follow instructions from your health care provider about how and when to flush the port. Ports are usually flushed with saline solution or a medicine called heparin. The need for flushing will depend on how the port is used: If the port is only used from time to time to give medicines or draw blood, the port may need to be flushed: Before and after medicines have been given. Before and after blood has been drawn. As part of routine maintenance. Flushing may be recommended every 4-6 weeks. If a constant infusion is running, the port may not need to be flushed. Throw away any syringes in a disposal container that is meant for sharp items (sharps container). You can buy a sharps container from a pharmacy, or you can make one by using an empty hard plastic bottle with a cover. How long will my port stay implanted? The port can stay in for as long as your health care provider thinks it is needed. When it is time for the port to come out, a surgery will be done to remove it. The surgery will be similar to the procedure that was done to putthe port in. Follow these instructions at home:  Flush your port as told by your health care provider. If you need an infusion over several days, follow instructions from your health care provider about how to take   care of your port site. Make sure you: Wash your hands with soap and water before you change your dressing. If soap and water are not available, use alcohol-based hand sanitizer. Change your dressing as told by your health care provider. Place any used dressings or infusion bags into a plastic bag. Throw that bag in the trash. Keep the dressing that covers the needle clean and dry. Do not get it wet. Do not use scissors or sharp objects near the tube. Keep the tube clamped, unless it is being used. Check your port site every day for signs of infection. Check for: Redness, swelling, or  pain. Fluid or blood. Pus or a bad smell. Protect the skin around the port site. Avoid wearing bra straps that rub or irritate the site. Protect the skin around your port from seat belts. Place a soft pad over your chest if needed. Bathe or shower as told by your health care provider. The site may get wet as long as you are not actively receiving an infusion. Return to your normal activities as told by your health care provider. Ask your health care provider what activities are safe for you. Carry a medical alert card or wear a medical alert bracelet at all times. This will let health care providers know that you have an implanted port in case of an emergency. Get help right away if: You have redness, swelling, or pain at the port site. You have fluid or blood coming from your port site. You have pus or a bad smell coming from the port site. You have a fever. Summary Implanted ports are usually placed in the chest for long-term IV access. Follow instructions from your health care provider about flushing the port and changing bandages (dressings). Take care of the area around your port by avoiding clothing that puts pressure on the area, and by watching for signs of infection. Protect the skin around your port from seat belts. Place a soft pad over your chest if needed. Get help right away if you have a fever or you have redness, swelling, pain, drainage, or a bad smell at the port site. This information is not intended to replace advice given to you by your health care provider. Make sure you discuss any questions you have with your healthcare provider. Document Revised: 07/03/2019 Document Reviewed: 07/03/2019 Elsevier Patient Education  2022 [employer]  

## 2020-09-30 NOTE — Patient Instructions (Signed)
[facility]  Discharge Instructions: Thank you for choosing [facility] to provide your oncology and hematology care.   If you have a lab appointment with the [facility], please go directly to the [facility] and check in at the registration area.   Wear comfortable clothing and clothing appropriate for easy access to any Portacath or PICC line.   We strive to give you quality time with your provider. You may need to reschedule your appointment if you arrive late (15 or more minutes).  Arriving late affects you and other patients whose appointments are after yours.  Also, if you miss three or more appointments without notifying the office, you may be dismissed from the clinic at the provider's discretion.      For prescription refill requests, have your pharmacy contact our office and allow 72 hours for refills to be completed.    Today you received the following chemotherapy and/or immunotherapy agents: irinotecan, folfox, leucovorin, floururacil.    To help prevent nausea and vomiting after your treatment, we encourage you to take your nausea medication as directed.  BELOW ARE SYMPTOMS THAT SHOULD BE REPORTED IMMEDIATELY: *FEVER GREATER THAN 100.4 F (38 C) OR HIGHER *CHILLS OR SWEATING *NAUSEA AND VOMITING THAT IS NOT CONTROLLED WITH YOUR NAUSEA MEDICATION *UNUSUAL SHORTNESS OF BREATH *UNUSUAL BRUISING OR BLEEDING *URINARY PROBLEMS (pain or burning when urinating, or frequent urination) *BOWEL PROBLEMS (unusual diarrhea, constipation, pain near the anus) TENDERNESS IN MOUTH AND THROAT WITH OR WITHOUT PRESENCE OF ULCERS (sore throat, sores in mouth, or a toothache) UNUSUAL RASH, SWELLING OR PAIN  UNUSUAL VAGINAL DISCHARGE OR ITCHING   Items with * indicate a potential emergency and should be followed up as soon as possible or go to the Emergency Department if any problems should occur.  Please show the CHEMOTHERAPY ALERT CARD or  IMMUNOTHERAPY ALERT CARD at check-in to the Emergency Department and triage nurse.  Should you have questions after your visit or need to cancel or reschedule your appointment, please contact [facility]  Dept: [phone]  and follow the prompts.  Office hours are 8:00 a.m. to 4:30 p.m. Monday - Friday. Please note that voicemails left after 4:00 p.m. may not be returned until the following business day.  We are closed weekends and major holidays. You have access to a nurse at all times for urgent questions. Please call the main number to the clinic Dept: [phone] and follow the prompts.   For any non-urgent questions, you may also contact your provider using MyChart. We now offer e-Visits for anyone 39 and older to request care online for non-urgent symptoms. For details visit [email or web address].   Also download the MyChart app! Go to the app store, search "MyChart", open the app, select [facility], and log in with your MyChart username and password.  Due to Covid, a mask is required upon entering the hospital/clinic. If you do not have a mask, one will be given to you upon arrival. For doctor visits, patients may have 1 support person aged 73 or older with them. For treatment visits, patients cannot have anyone with them due to current Covid guidelines and our immunocompromised population.   Irinotecan injection What is this medication? IRINOTECAN (ir in oh TEE kan ) is a chemotherapy drug. It is used to treatcolon and rectal cancer. This medicine may be used for other purposes; ask your health care provider orpharmacist if you have questions. COMMON BRAND NAME(S): Camptosar What  should I tell my care team before I take this medication? They need to know if you have any of these conditions: dehydration diarrhea infection (especially a virus infection such as chickenpox, cold sores, or herpes) liver disease low blood counts, like low white cell, platelet,  or red cell counts low levels of calcium, magnesium, or potassium in the blood recent or ongoing radiation therapy an unusual or allergic reaction to irinotecan, other medicines, foods, dyes, or preservatives pregnant or trying to get pregnant breast-feeding How should I use this medication? This drug is given as an infusion into a vein. It is administered in a hospitalor clinic by a specially trained health care professional. Talk to your pediatrician regarding the use of this medicine in children.Special care may be needed. Overdosage: If you think you have taken too much of this medicine contact apoison control center or emergency room at once. NOTE: This medicine is only for you. Do not share this medicine with others. What if I miss a dose? It is important not to miss your dose. Call your doctor or health careprofessional if you are unable to keep an appointment. What may interact with this medication? Do not take this medicine with any of the following medications: cobicistat itraconazole This medicine may interact with the following medications: antiviral medicines for HIV or AIDS certain antibiotics like rifampin or rifabutin certain medicines for fungal infections like ketoconazole, posaconazole, and voriconazole certain medicines for seizures like carbamazepine, phenobarbital, phenotoin clarithromycin gemfibrozil nefazodone [facility] John's Wort This list may not describe all possible interactions. Give your health care provider a list of all the medicines, herbs, non-prescription drugs, or dietary supplements you use. Also tell them if you smoke, drink alcohol, or use illegaldrugs. Some items may interact with your medicine. What should I watch for while using this medication? Your condition will be monitored carefully while you are receiving this medicine. You will need important blood work done while you are taking thismedicine. This drug may make you feel generally unwell. This  is not uncommon, as chemotherapy can affect healthy cells as well as cancer cells. Report any side effects. Continue your course of treatment even though you feel ill unless yourdoctor tells you to stop. In some cases, you may be given additional medicines to help with side effects.Follow all directions for their use. You may get drowsy or dizzy. Do not drive, use machinery, or do anything that needs mental alertness until you know how this medicine affects you. Do not stand or sit up quickly, especially if you are an older patient. This reducesthe risk of dizzy or fainting spells. Call your health care professional for advice if you get a fever, chills, or sore throat, or other symptoms of a cold or flu. Do not treat yourself. This medicine decreases your body's ability to fight infections. Try to avoid beingaround people who are sick. Avoid taking products that contain aspirin, acetaminophen, ibuprofen, naproxen, or ketoprofen unless instructed by your doctor. These medicines may hide afever. This medicine may increase your risk to bruise or bleed. Call your doctor orhealth care professional if you notice any unusual bleeding. Be careful brushing and flossing your teeth or using a toothpick because you may get an infection or bleed more easily. If you have any dental work done,tell your dentist you are receiving this medicine. Do not become pregnant while taking this medicine or for 6 months after stopping it. Women should inform their health care professional if they wish to become pregnant or think  they might be pregnant. Men should not father a child while taking this medicine and for 3 months after stopping it. There is potential for serious side effects to an unborn child. Talk to your health careprofessional for more information. Do not breast-feed an infant while taking this medicine or for 7 days afterstopping it. This medicine has caused ovarian failure in some women. This medicine may make it  more difficult to get pregnant. Talk to your health care professional if Ventura Sellers concerned about your fertility. This medicine has caused decreased sperm counts in some men. This may make it more difficult to father a child. Talk to your health care professional if Ventura Sellers concerned about your fertility. What side effects may I notice from receiving this medication? Side effects that you should report to your doctor or health care professionalas soon as possible: allergic reactions like skin rash, itching or hives, swelling of the face, lips, or tongue chest pain diarrhea flushing, runny nose, sweating during infusion low blood counts - this medicine may decrease the number of white blood cells, red blood cells and platelets. You may be at increased risk for infections and bleeding. nausea, vomiting pain, swelling, warmth in the leg signs of decreased platelets or bleeding - bruising, pinpoint red spots on the skin, black, tarry stools, blood in the urine signs of infection - fever or chills, cough, sore throat, pain or difficulty passing urine signs of decreased red blood cells - unusually weak or tired, fainting spells, lightheadedness Side effects that usually do not require medical attention (report to yourdoctor or health care professional if they continue or are bothersome): constipation hair loss headache loss of appetite mouth sores stomach pain This list may not describe all possible side effects. Call your doctor for medical advice about side effects. You may report side effects to FDA at1-800-FDA-1088. Where should I keep my medication? This drug is given in a hospital or clinic and will not be stored at home. NOTE: This sheet is a summary. It may not cover all possible information. If you have questions about this medicine, talk to your doctor, pharmacist, orhealth care provider.  2022 Elsevier/Gold Standard (2019-01-17 17:46:13)  Oxaliplatin Injection What is this  medication? OXALIPLATIN (ox AL i PLA tin) is a chemotherapy drug. It targets fast dividing cells, like cancer cells, and causes these cells to die. This medicine is usedto treat cancers of the colon and rectum, and many other cancers. This medicine may be used for other purposes; ask your health care provider orpharmacist if you have questions. COMMON BRAND NAME(S): Eloxatin What should I tell my care team before I take this medication? They need to know if you have any of these conditions: heart disease history of irregular heartbeat liver disease low blood counts, like white cells, platelets, or red blood cells lung or breathing disease, like asthma take medicines that treat or prevent blood clots tingling of the fingers or toes, or other nerve disorder an unusual or allergic reaction to oxaliplatin, other chemotherapy, other medicines, foods, dyes, or preservatives pregnant or trying to get pregnant breast-feeding How should I use this medication? This drug is given as an infusion into a vein. It is administered in a hospitalor clinic by a specially trained health care professional. Talk to your pediatrician regarding the use of this medicine in children.Special care may be needed. Overdosage: If you think you have taken too much of this medicine contact apoison control center or emergency room at once. NOTE: This medicine is only  for you. Do not share this medicine with others. What if I miss a dose? It is important not to miss a dose. Call your doctor or health careprofessional if you are unable to keep an appointment. What may interact with this medication? Do not take this medicine with any of the following medications: cisapride dronedarone pimozide thioridazine This medicine may also interact with the following medications: aspirin and aspirin-like medicines certain medicines that treat or prevent blood clots like warfarin, apixaban, dabigatran, and  rivaroxaban cisplatin cyclosporine diuretics medicines for infection like acyclovir, adefovir, amphotericin B, bacitracin, cidofovir, foscarnet, ganciclovir, gentamicin, pentamidine, vancomycin NSAIDs, medicines for pain and inflammation, like ibuprofen or naproxen other medicines that prolong the QT interval (an abnormal heart rhythm) pamidronate zoledronic acid This list may not describe all possible interactions. Give your health care provider a list of all the medicines, herbs, non-prescription drugs, or dietary supplements you use. Also tell them if you smoke, drink alcohol, or use illegaldrugs. Some items may interact with your medicine. What should I watch for while using this medication? Your condition will be monitored carefully while you are receiving thismedicine. You may need blood work done while you are taking this medicine. This medicine may make you feel generally unwell. This is not uncommon as chemotherapy can affect healthy cells as well as cancer cells. Report any side effects. Continue your course of treatment even though you feel ill unless yourhealthcare professional tells you to stop. This medicine can make you more sensitive to cold. Do not drink cold drinks or use ice. Cover exposed skin before coming in contact with cold temperatures or cold objects. When out in cold weather wear warm clothing and cover your mouth and nose to warm the air that goes into your lungs. Tell your doctor if you getsensitive to the cold. Do not become pregnant while taking this medicine or for 9 months after stopping it. Women should inform their health care professional if they wish to become pregnant or think they might be pregnant. Men should not father a child while taking this medicine and for 6 months after stopping it. There is potential for serious side effects to an unborn child. Talk to your health careprofessional for more information. Do not breast-feed a child while taking this  medicine or for 3 months afterstopping it. This medicine has caused ovarian failure in some women. This medicine may make it more difficult to get pregnant. Talk to your health care professional if Ventura Sellers concerned about your fertility. This medicine has caused decreased sperm counts in some men. This may make it more difficult to father a child. Talk to your health care professional if Ventura Sellers concerned about your fertility. This medicine may increase your risk of getting an infection. Call your health care professional for advice if you get a fever, chills, or sore throat, or other symptoms of a cold or flu. Do not treat yourself. Try to avoid beingaround people who are sick. Avoid taking medicines that contain aspirin, acetaminophen, ibuprofen, naproxen, or ketoprofen unless instructed by your health care professional.These medicines may hide a fever. Be careful brushing or flossing your teeth or using a toothpick because you may get an infection or bleed more easily. If you have any dental work done, Primary school teacher you are receiving this medicine. What side effects may I notice from receiving this medication? Side effects that you should report to your doctor or health care professionalas soon as possible: allergic reactions like skin rash, itching or hives, swelling of  the face, lips, or tongue breathing problems cough low blood counts - this medicine may decrease the number of white blood cells, red blood cells, and platelets. You may be at increased risk for infections and bleeding nausea, vomiting pain, redness, or irritation at site where injected pain, tingling, numbness in the hands or feet signs and symptoms of bleeding such as bloody or black, tarry stools; red or dark brown urine; spitting up blood or brown material that looks like coffee grounds; red spots on the skin; unusual bruising or bleeding from the eyes, gums, or nose signs and symptoms of a dangerous change in heartbeat or  heart rhythm like chest pain; dizziness; fast, irregular heartbeat; palpitations; feeling faint or lightheaded; falls signs and symptoms of infection like fever; chills; cough; sore throat; pain or trouble passing urine signs and symptoms of liver injury like dark yellow or brown urine; general ill feeling or flu-like symptoms; light-colored stools; loss of appetite; nausea; right upper belly pain; unusually weak or tired; yellowing of the eyes or skin signs and symptoms of low red blood cells or anemia such as unusually weak or tired; feeling faint or lightheaded; falls signs and symptoms of muscle injury like dark urine; trouble passing urine or change in the amount of urine; unusually weak or tired; muscle pain; back pain Side effects that usually do not require medical attention (report to yourdoctor or health care professional if they continue or are bothersome): changes in taste diarrhea gas hair loss loss of appetite mouth sores This list may not describe all possible side effects. Call your doctor for medical advice about side effects. You may report side effects to FDA at1-800-FDA-1088. Where should I keep my medication? This drug is given in a hospital or clinic and will not be stored at home. NOTE: This sheet is a summary. It may not cover all possible information. If you have questions about this medicine, talk to your doctor, pharmacist, orhealth care provider.  2022 Elsevier/Gold Standard (2018-07-06 12:20:35)  Leucovorin injection What is this medication? LEUCOVORIN (loo koe VOR in) is used to prevent or treat the harmful effects of some medicines. This medicine is used to treat anemia caused by a low amount of folic acid in the body. It is also used with 5-fluorouracil (5-FU) to treatcolon cancer. This medicine may be used for other purposes; ask your health care provider orpharmacist if you have questions. What should I tell my care team before I take this medication? They  need to know if you have any of these conditions: anemia from low levels of vitamin B-12 in the blood an unusual or allergic reaction to leucovorin, folic acid, other medicines, foods, dyes, or preservatives pregnant or trying to get pregnant breast-feeding How should I use this medication? This medicine is for injection into a muscle or into a vein. It is given by ahealth care professional in a hospital or clinic setting. Talk to your pediatrician regarding the use of this medicine in children.Special care may be needed. Overdosage: If you think you have taken too much of this medicine contact apoison control center or emergency room at once. NOTE: This medicine is only for you. Do not share this medicine with others. What if I miss a dose? This does not apply. What may interact with this medication? capecitabine fluorouracil phenobarbital phenytoin primidone trimethoprim-sulfamethoxazole This list may not describe all possible interactions. Give your health care provider a list of all the medicines, herbs, non-prescription drugs, or dietary supplements you use. Also  tell them if you smoke, drink alcohol, or use illegaldrugs. Some items may interact with your medicine. What should I watch for while using this medication? Your condition will be monitored carefully while you are receiving thismedicine. This medicine may increase the side effects of 5-fluorouracil, 5-FU. Tell your doctor or health care professional if you have diarrhea or mouth sores that donot get better or that get worse. What side effects may I notice from receiving this medication? Side effects that you should report to your doctor or health care professionalas soon as possible: allergic reactions like skin rash, itching or hives, swelling of the face, lips, or tongue breathing problems fever, infection mouth sores unusual bleeding or bruising unusually weak or tired Side effects that usually do not require medical  attention (report to yourdoctor or health care professional if they continue or are bothersome): constipation or diarrhea loss of appetite nausea, vomiting This list may not describe all possible side effects. Call your doctor for medical advice about side effects. You may report side effects to FDA at1-800-FDA-1088. Where should I keep my medication? This drug is given in a hospital or clinic and will not be stored at home. NOTE: This sheet is a summary. It may not cover all possible information. If you have questions about this medicine, talk to your doctor, pharmacist, orhealth care provider.  2022 Elsevier/Gold Standard (2007-08-23 16:50:29)  Fluorouracil, 5-FU injection What is this medication? FLUOROURACIL, 5-FU (flure oh YOOR a sil) is a chemotherapy drug. It slows the growth of cancer cells. This medicine is used to treat many types of cancer like breast cancer, colon or rectal cancer, pancreatic cancer, and stomachcancer. This medicine may be used for other purposes; ask your health care provider orpharmacist if you have questions. COMMON BRAND NAME(S): Adrucil What should I tell my care team before I take this medication? They need to know if you have any of these conditions: blood disorders dihydropyrimidine dehydrogenase (DPD) deficiency infection (especially a virus infection such as chickenpox, cold sores, or herpes) kidney disease liver disease malnourished, poor nutrition recent or ongoing radiation therapy an unusual or allergic reaction to fluorouracil, other chemotherapy, other medicines, foods, dyes, or preservatives pregnant or trying to get pregnant breast-feeding How should I use this medication? This drug is given as an infusion or injection into a vein. It is administeredin a hospital or clinic by a specially trained health care professional. Talk to your pediatrician regarding the use of this medicine in children.Special care may be needed. Overdosage: If you  think you have taken too much of this medicine contact apoison control center or emergency room at once. NOTE: This medicine is only for you. Do not share this medicine with others. What if I miss a dose? It is important not to miss your dose. Call your doctor or health careprofessional if you are unable to keep an appointment. What may interact with this medication? Do not take this medicine with any of the following medications: live virus vaccines This medicine may also interact with the following medications: medicines that treat or prevent blood clots like warfarin, enoxaparin, and dalteparin This list may not describe all possible interactions. Give your health care provider a list of all the medicines, herbs, non-prescription drugs, or dietary supplements you use. Also tell them if you smoke, drink alcohol, or use illegaldrugs. Some items may interact with your medicine. What should I watch for while using this medication? Visit your doctor for checks on your progress. This drug may make  you feel generally unwell. This is not uncommon, as chemotherapy can affect healthy cells as well as cancer cells. Report any side effects. Continue your course oftreatment even though you feel ill unless your doctor tells you to stop. In some cases, you may be given additional medicines to help with side effects.Follow all directions for their use. Call your doctor or health care professional for advice if you get a fever, chills or sore throat, or other symptoms of a cold or flu. Do not treat yourself. This drug decreases your body's ability to fight infections. Try toavoid being around people who are sick. This medicine may increase your risk to bruise or bleed. Call your doctor orhealth care professional if you notice any unusual bleeding. Be careful brushing and flossing your teeth or using a toothpick because you may get an infection or bleed more easily. If you have any dental work done,tell your dentist  you are receiving this medicine. Avoid taking products that contain aspirin, acetaminophen, ibuprofen, naproxen, or ketoprofen unless instructed by your doctor. These medicines may hide afever. Do not become pregnant while taking this medicine. Women should inform their doctor if they wish to become pregnant or think they might be pregnant. There is a potential for serious side effects to an unborn child. Talk to your health care professional or pharmacist for more information. Do not breast-feed aninfant while taking this medicine. Men should inform their doctor if they wish to father a child. This medicinemay lower sperm counts. Do not treat diarrhea with over the counter products. Contact your doctor ifyou have diarrhea that lasts more than 2 days or if it is severe and watery. This medicine can make you more sensitive to the sun. Keep out of the sun. If you cannot avoid being in the sun, wear protective clothing and use sunscreen.Do not use sun lamps or tanning beds/booths. What side effects may I notice from receiving this medication? Side effects that you should report to your doctor or health care professionalas soon as possible: allergic reactions like skin rash, itching or hives, swelling of the face, lips, or tongue low blood counts - this medicine may decrease the number of white blood cells, red blood cells and platelets. You may be at increased risk for infections and bleeding. signs of infection - fever or chills, cough, sore throat, pain or difficulty passing urine signs of decreased platelets or bleeding - bruising, pinpoint red spots on the skin, black, tarry stools, blood in the urine signs of decreased red blood cells - unusually weak or tired, fainting spells, lightheadedness breathing problems changes in vision chest pain mouth sores nausea and vomiting pain, swelling, redness at site where injected pain, tingling, numbness in the hands or feet redness, swelling, or sores on  hands or feet stomach pain unusual bleeding Side effects that usually do not require medical attention (report to yourdoctor or health care professional if they continue or are bothersome): changes in finger or toe nails diarrhea dry or itchy skin hair loss headache loss of appetite sensitivity of eyes to the light stomach upset unusually teary eyes This list may not describe all possible side effects. Call your doctor for medical advice about side effects. You may report side effects to FDA at1-800-FDA-1088. Where should I keep my medication? This drug is given in a hospital or clinic and will not be stored at home. NOTE: This sheet is a summary. It may not cover all possible information. If you have questions about this medicine, talk  to your doctor, pharmacist, orhealth care provider.  2022 Elsevier/Gold Standard (2019-01-17 15:00:03)

## 2020-09-30 NOTE — Addendum Note (Signed)
Addended by: [name] B on: 09/30/2020 10:18 AM   Modules accepted: Orders

## 2020-10-01 ENCOUNTER — Other Ambulatory Visit: Payer: Self-pay | Admitting: Nurse Practitioner

## 2020-10-02 ENCOUNTER — Other Ambulatory Visit: Payer: Self-pay

## 2020-10-02 ENCOUNTER — Inpatient Hospital Stay: Payer: Medicaid Other

## 2020-10-02 ENCOUNTER — Ambulatory Visit: Payer: Medicaid Other

## 2020-10-02 VITALS — BP 157/83 | HR 74 | Temp 98.2°F | Resp 18

## 2020-10-02 DIAGNOSIS — Z5111 Encounter for antineoplastic chemotherapy: Secondary | ICD-10-CM | POA: Diagnosis not present

## 2020-10-02 DIAGNOSIS — C252 Malignant neoplasm of tail of pancreas: Secondary | ICD-10-CM

## 2020-10-02 MED ORDER — HEPARIN SOD (PORK) LOCK FLUSH 100 UNIT/ML IV SOLN
500.0000 [IU] | Freq: Once | INTRAVENOUS | Status: AC | PRN
Start: 2020-10-02 — End: 2020-10-02
  Administered 2020-10-02: 500 [IU]
  Filled 2020-10-02: qty 5

## 2020-10-02 MED ORDER — PEGFILGRASTIM-BMEZ 6 MG/0.6ML ~~LOC~~ SOSY
6.0000 mg | PREFILLED_SYRINGE | Freq: Once | SUBCUTANEOUS | Status: AC
  Administered 2020-10-02: 6 mg via SUBCUTANEOUS

## 2020-10-02 MED ORDER — SODIUM CHLORIDE 0.9% FLUSH
10.0000 mL | INTRAVENOUS | Status: DC | PRN
  Administered 2020-10-02: 10 mL
  Filled 2020-10-02: qty 10

## 2020-10-02 NOTE — Patient Instructions (Signed)

## 2020-10-13 ENCOUNTER — Other Ambulatory Visit: Payer: Self-pay | Admitting: Oncology

## 2020-10-14 ENCOUNTER — Inpatient Hospital Stay: Payer: Medicaid Other

## 2020-10-14 ENCOUNTER — Encounter: Payer: Self-pay | Admitting: Nurse Practitioner

## 2020-10-14 ENCOUNTER — Inpatient Hospital Stay (HOSPITAL_BASED_OUTPATIENT_CLINIC_OR_DEPARTMENT_OTHER): Payer: Medicaid Other | Admitting: Nurse Practitioner

## 2020-10-14 ENCOUNTER — Other Ambulatory Visit: Payer: Self-pay

## 2020-10-14 VITALS — BP 156/87 | HR 92 | Temp 98.3°F | Resp 18 | Ht 63.0 in | Wt 123.2 lb

## 2020-10-14 DIAGNOSIS — C252 Malignant neoplasm of tail of pancreas: Secondary | ICD-10-CM

## 2020-10-14 DIAGNOSIS — Z5111 Encounter for antineoplastic chemotherapy: Secondary | ICD-10-CM | POA: Diagnosis not present

## 2020-10-14 LAB — CMP (CANCER CENTER ONLY)
ALT: 55 U/L — ABNORMAL HIGH (ref 0–44)
AST: 56 U/L — ABNORMAL HIGH (ref 15–41)
Albumin: 3.3 g/dL — ABNORMAL LOW (ref 3.5–5.0)
Alkaline Phosphatase: 412 U/L — ABNORMAL HIGH (ref 38–126)
Anion gap: 9 (ref 5–15)
BUN: 5 mg/dL — ABNORMAL LOW (ref 8–23)
CO2: 25 mmol/L (ref 22–32)
Calcium: 9.6 mg/dL (ref 8.9–10.3)
Chloride: 104 mmol/L (ref 98–111)
Creatinine: 0.7 mg/dL (ref 0.44–1.00)
GFR, Estimated: >60 mL/min (ref >60)
Glucose, Bld: 93 mg/dL (ref 70–99)
Potassium: 3.6 mmol/L (ref 3.5–5.1)
Sodium: 138 mmol/L (ref 135–145)
Total Bilirubin: 0.5 mg/dL (ref 0.3–1.2)
Total Protein: 7.1 g/dL (ref 6.5–8.1)

## 2020-10-14 LAB — CBC WITH DIFFERENTIAL (CANCER CENTER ONLY)
Abs Immature Granulocytes: 0.21 10*3/uL — ABNORMAL HIGH (ref 0.00–0.07)
Basophils Absolute: 0.1 10*3/uL (ref 0.0–0.1)
Basophils Relative: 1 %
Eosinophils Absolute: 0.2 10*3/uL (ref 0.0–0.5)
Eosinophils Relative: 2 %
HCT: 36.1 % (ref 36.0–46.0)
Hemoglobin: 11.6 g/dL — ABNORMAL LOW (ref 12.0–15.0)
Immature Granulocytes: 2 %
Lymphocytes Relative: 33 %
Lymphs Abs: 3.3 10*3/uL (ref 0.7–4.0)
MCH: 29.1 pg (ref 26.0–34.0)
MCHC: 32.1 g/dL (ref 30.0–36.0)
MCV: 90.5 fL (ref 80.0–100.0)
Monocytes Absolute: 1.3 10*3/uL — ABNORMAL HIGH (ref 0.1–1.0)
Monocytes Relative: 13 %
Neutro Abs: 4.9 10*3/uL (ref 1.7–7.7)
Neutrophils Relative %: 49 %
Platelet Count: 200 10*3/uL (ref 150–400)
RBC: 3.99 MIL/uL (ref 3.87–5.11)
RDW: 17.7 % — ABNORMAL HIGH (ref 11.5–15.5)
WBC Count: 10 10*3/uL (ref 4.0–10.5)
nRBC: 0.2 % (ref 0.0–0.2)

## 2020-10-14 MED ORDER — SODIUM CHLORIDE 0.9% FLUSH
10.0000 mL | INTRAVENOUS | Status: DC | PRN
  Filled 2020-10-14: qty 10

## 2020-10-14 MED ORDER — SODIUM CHLORIDE 0.9 % IV SOLN
10.0000 mg | Freq: Once | INTRAVENOUS | Status: AC
  Administered 2020-10-14: 10 mg via INTRAVENOUS
  Filled 2020-10-14: qty 1

## 2020-10-14 MED ORDER — SODIUM CHLORIDE 0.9 % IV SOLN
150.0000 mg | Freq: Once | INTRAVENOUS | Status: AC
  Administered 2020-10-14: 150 mg via INTRAVENOUS
  Filled 2020-10-14: qty 5

## 2020-10-14 MED ORDER — PALONOSETRON HCL INJECTION 0.25 MG/5ML
0.2500 mg | Freq: Once | INTRAVENOUS | Status: AC
  Administered 2020-10-14: 0.25 mg via INTRAVENOUS
  Filled 2020-10-14: qty 5

## 2020-10-14 MED ORDER — FLUOROURACIL CHEMO INJECTION 5 GM/100ML
2400.0000 mg/m2 | INTRAVENOUS | Status: DC
  Administered 2020-10-14: 3850 mg via INTRAVENOUS
  Filled 2020-10-14: qty 77

## 2020-10-14 MED ORDER — SODIUM CHLORIDE 0.9 % IV SOLN
400.0000 mg/m2 | Freq: Once | INTRAVENOUS | Status: AC
  Administered 2020-10-14: 644 mg via INTRAVENOUS
  Filled 2020-10-14: qty 32.2

## 2020-10-14 MED ORDER — SODIUM CHLORIDE 0.9 % IV SOLN
125.0000 mg/m2 | Freq: Once | INTRAVENOUS | Status: AC
  Administered 2020-10-14: 200 mg via INTRAVENOUS
  Filled 2020-10-14: qty 10

## 2020-10-14 MED ORDER — ATROPINE SULFATE 1 MG/ML IJ SOLN
0.5000 mg | Freq: Once | INTRAMUSCULAR | Status: AC | PRN
  Administered 2020-10-14: 0.5 mg via INTRAVENOUS
  Filled 2020-10-14: qty 1

## 2020-10-14 MED ORDER — SODIUM CHLORIDE 0.9 % IV SOLN
Freq: Once | INTRAVENOUS | Status: AC
  Filled 2020-10-14: qty 250

## 2020-10-14 NOTE — Patient Instructions (Signed)

## 2020-10-14 NOTE — Patient Instructions (Signed)
[facility]  Discharge Instructions: Thank you for choosing [facility] to provide your oncology and hematology care.   If you have a lab appointment with the [facility], please go directly to the [facility] and check in at the registration area.   Wear comfortable clothing and clothing appropriate for easy access to any Portacath or PICC line.   We strive to give you quality time with your provider. You may need to reschedule your appointment if you arrive late (15 or more minutes).  Arriving late affects you and other patients whose appointments are after yours.  Also, if you miss three or more appointments without notifying the office, you may be dismissed from the clinic at the provider's discretion.      For prescription refill requests, have your pharmacy contact our office and allow 72 hours for refills to be completed.    Today you received the following chemotherapy and/or immunotherapy agents: irinotecan, leucovorin, floururacil.    To help prevent nausea and vomiting after your treatment, we encourage you to take your nausea medication as directed.  BELOW ARE SYMPTOMS THAT SHOULD BE REPORTED IMMEDIATELY: *FEVER GREATER THAN 100.4 F (38 C) OR HIGHER *CHILLS OR SWEATING *NAUSEA AND VOMITING THAT IS NOT CONTROLLED WITH YOUR NAUSEA MEDICATION *UNUSUAL SHORTNESS OF BREATH *UNUSUAL BRUISING OR BLEEDING *URINARY PROBLEMS (pain or burning when urinating, or frequent urination) *BOWEL PROBLEMS (unusual diarrhea, constipation, pain near the anus) TENDERNESS IN MOUTH AND THROAT WITH OR WITHOUT PRESENCE OF ULCERS (sore throat, sores in mouth, or a toothache) UNUSUAL RASH, SWELLING OR PAIN  UNUSUAL VAGINAL DISCHARGE OR ITCHING   Items with * indicate a potential emergency and should be followed up as soon as possible or go to the Emergency Department if any problems should occur.  Please show the CHEMOTHERAPY ALERT CARD or  IMMUNOTHERAPY ALERT CARD at check-in to the Emergency Department and triage nurse.  Should you have questions after your visit or need to cancel or reschedule your appointment, please contact [facility]  Dept: [phone]  and follow the prompts.  Office hours are 8:00 a.m. to 4:30 p.m. Monday - Friday. Please note that voicemails left after 4:00 p.m. may not be returned until the following business day.  We are closed weekends and major holidays. You have access to a nurse at all times for urgent questions. Please call the main number to the clinic Dept: [phone] and follow the prompts.   For any non-urgent questions, you may also contact your provider using MyChart. We now offer e-Visits for anyone 50 and older to request care online for non-urgent symptoms. For details visit [email or web address].   Also download the MyChart app! Go to the app store, search "MyChart", open the app, select [facility], and log in with your MyChart username and password.  Due to Covid, a mask is required upon entering the hospital/clinic. If you do not have a mask, one will be given to you upon arrival. For doctor visits, patients may have 1 support person aged 27 or older with them. For treatment visits, patients cannot have anyone with them due to current Covid guidelines and our immunocompromised population.   Irinotecan injection What is this medication? IRINOTECAN (ir in oh TEE kan ) is a chemotherapy drug. It is used to treatcolon and rectal cancer. This medicine may be used for other purposes; ask your health care provider orpharmacist if you have questions. COMMON BRAND NAME(S): Camptosar What should  I tell my care team before I take this medication? They need to know if you have any of these conditions: dehydration diarrhea infection (especially a virus infection such as chickenpox, cold sores, or herpes) liver disease low blood counts, like low white cell, platelet,  or red cell counts low levels of calcium, magnesium, or potassium in the blood recent or ongoing radiation therapy an unusual or allergic reaction to irinotecan, other medicines, foods, dyes, or preservatives pregnant or trying to get pregnant breast-feeding How should I use this medication? This drug is given as an infusion into a vein. It is administered in a hospitalor clinic by a specially trained health care professional. Talk to your pediatrician regarding the use of this medicine in children.Special care may be needed. Overdosage: If you think you have taken too much of this medicine contact apoison control center or emergency room at once. NOTE: This medicine is only for you. Do not share this medicine with others. What if I miss a dose? It is important not to miss your dose. Call your doctor or health careprofessional if you are unable to keep an appointment. What may interact with this medication? Do not take this medicine with any of the following medications: cobicistat itraconazole This medicine may interact with the following medications: antiviral medicines for HIV or AIDS certain antibiotics like rifampin or rifabutin certain medicines for fungal infections like ketoconazole, posaconazole, and voriconazole certain medicines for seizures like carbamazepine, phenobarbital, phenotoin clarithromycin gemfibrozil nefazodone [facility]. John's Wort This list may not describe all possible interactions. Give your health care provider a list of all the medicines, herbs, non-prescription drugs, or dietary supplements you use. Also tell them if you smoke, drink alcohol, or use illegaldrugs. Some items may interact with your medicine. What should I watch for while using this medication? Your condition will be monitored carefully while you are receiving this medicine. You will need important blood work done while you are taking thismedicine. This drug may make you feel generally unwell. This  is not uncommon, as chemotherapy can affect healthy cells as well as cancer cells. Report any side effects. Continue your course of treatment even though you feel ill unless yourdoctor tells you to stop. In some cases, you may be given additional medicines to help with side effects.Follow all directions for their use. You may get drowsy or dizzy. Do not drive, use machinery, or do anything that needs mental alertness until you know how this medicine affects you. Do not stand or sit up quickly, especially if you are an older patient. This reducesthe risk of dizzy or fainting spells. Call your health care professional for advice if you get a fever, chills, or sore throat, or other symptoms of a cold or flu. Do not treat yourself. This medicine decreases your body's ability to fight infections. Try to avoid beingaround people who are sick. Avoid taking products that contain aspirin, acetaminophen, ibuprofen, naproxen, or ketoprofen unless instructed by your doctor. These medicines may hide afever. This medicine may increase your risk to bruise or bleed. Call your doctor orhealth care professional if you notice any unusual bleeding. Be careful brushing and flossing your teeth or using a toothpick because you may get an infection or bleed more easily. If you have any dental work done,tell your dentist you are receiving this medicine. Do not become pregnant while taking this medicine or for 6 months after stopping it. Women should inform their health care professional if they wish to become pregnant or think they  might be pregnant. Men should not father a child while taking this medicine and for 3 months after stopping it. There is potential for serious side effects to an unborn child. Talk to your health careprofessional for more information. Do not breast-feed an infant while taking this medicine or for 7 days afterstopping it. This medicine has caused ovarian failure in some women. This medicine may make it  more difficult to get pregnant. Talk to your health care professional if [place] Sellers concerned about your fertility. This medicine has caused decreased sperm counts in some men. This may make it more difficult to father a child. Talk to your health care professional if [place] Sellers concerned about your fertility. What side effects may I notice from receiving this medication? Side effects that you should report to your doctor or health care professionalas soon as possible: allergic reactions like skin rash, itching or hives, swelling of the face, lips, or tongue chest pain diarrhea flushing, runny nose, sweating during infusion low blood counts - this medicine may decrease the number of white blood cells, red blood cells and platelets. You may be at increased risk for infections and bleeding. nausea, vomiting pain, swelling, warmth in the leg signs of decreased platelets or bleeding - bruising, pinpoint red spots on the skin, black, tarry stools, blood in the urine signs of infection - fever or chills, cough, sore throat, pain or difficulty passing urine signs of decreased red blood cells - unusually weak or tired, fainting spells, lightheadedness Side effects that usually do not require medical attention (report to yourdoctor or health care professional if they continue or are bothersome): constipation hair loss headache loss of appetite mouth sores stomach pain This list may not describe all possible side effects. Call your doctor for medical advice about side effects. You may report side effects to FDA at1-800-FDA-1088. Where should I keep my medication? This drug is given in a hospital or clinic and will not be stored at home. NOTE: This sheet is a summary. It may not cover all possible information. If you have questions about this medicine, talk to your doctor, pharmacist, orhealth care provider.  2022 Elsevier/Gold Standard (2019-01-17 17:46:13)   Leucovorin injection What is this  medication? LEUCOVORIN (loo koe VOR in) is used to prevent or treat the harmful effects of some medicines. This medicine is used to treat anemia caused by a low amount of folic acid in the body. It is also used with 5-fluorouracil (5-FU) to treatcolon cancer. This medicine may be used for other purposes; ask your health care provider orpharmacist if you have questions. What should I tell my care team before I take this medication? They need to know if you have any of these conditions: anemia from low levels of vitamin B-12 in the blood an unusual or allergic reaction to leucovorin, folic acid, other medicines, foods, dyes, or preservatives pregnant or trying to get pregnant breast-feeding How should I use this medication? This medicine is for injection into a muscle or into a vein. It is given by ahealth care professional in a hospital or clinic setting. Talk to your pediatrician regarding the use of this medicine in children.Special care may be needed. Overdosage: If you think you have taken too much of this medicine contact apoison control center or emergency room at once. NOTE: This medicine is only for you. Do not share this medicine with others. What if I miss a dose? This does not apply. What may interact with this medication? capecitabine fluorouracil phenobarbital phenytoin primidone  trimethoprim-sulfamethoxazole This list may not describe all possible interactions. Give your health care provider a list of all the medicines, herbs, non-prescription drugs, or dietary supplements you use. Also tell them if you smoke, drink alcohol, or use illegaldrugs. Some items may interact with your medicine. What should I watch for while using this medication? Your condition will be monitored carefully while you are receiving thismedicine. This medicine may increase the side effects of 5-fluorouracil, 5-FU. Tell your doctor or health care professional if you have diarrhea or mouth sores that donot  get better or that get worse. What side effects may I notice from receiving this medication? Side effects that you should report to your doctor or health care professionalas soon as possible: allergic reactions like skin rash, itching or hives, swelling of the face, lips, or tongue breathing problems fever, infection mouth sores unusual bleeding or bruising unusually weak or tired Side effects that usually do not require medical attention (report to yourdoctor or health care professional if they continue or are bothersome): constipation or diarrhea loss of appetite nausea, vomiting This list may not describe all possible side effects. Call your doctor for medical advice about side effects. You may report side effects to FDA at1-800-FDA-1088. Where should I keep my medication? This drug is given in a hospital or clinic and will not be stored at home. NOTE: This sheet is a summary. It may not cover all possible information. If you have questions about this medicine, talk to your doctor, pharmacist, orhealth care provider.  2022 Elsevier/Gold Standard (2007-08-23 16:50:29)  Fluorouracil, 5-FU injection What is this medication? FLUOROURACIL, 5-FU (flure oh YOOR a sil) is a chemotherapy drug. It slows the growth of cancer cells. This medicine is used to treat many types of cancer like breast cancer, colon or rectal cancer, pancreatic cancer, and stomachcancer. This medicine may be used for other purposes; ask your health care provider orpharmacist if you have questions. COMMON BRAND NAME(S): Adrucil What should I tell my care team before I take this medication? They need to know if you have any of these conditions: blood disorders dihydropyrimidine dehydrogenase (DPD) deficiency infection (especially a virus infection such as chickenpox, cold sores, or herpes) kidney disease liver disease malnourished, poor nutrition recent or ongoing radiation therapy an unusual or allergic reaction to  fluorouracil, other chemotherapy, other medicines, foods, dyes, or preservatives pregnant or trying to get pregnant breast-feeding How should I use this medication? This drug is given as an infusion or injection into a vein. It is administeredin a hospital or clinic by a specially trained health care professional. Talk to your pediatrician regarding the use of this medicine in children.Special care may be needed. Overdosage: If you think you have taken too much of this medicine contact apoison control center or emergency room at once. NOTE: This medicine is only for you. Do not share this medicine with others. What if I miss a dose? It is important not to miss your dose. Call your doctor or health careprofessional if you are unable to keep an appointment. What may interact with this medication? Do not take this medicine with any of the following medications: live virus vaccines This medicine may also interact with the following medications: medicines that treat or prevent blood clots like warfarin, enoxaparin, and dalteparin This list may not describe all possible interactions. Give your health care provider a list of all the medicines, herbs, non-prescription drugs, or dietary supplements you use. Also tell them if you smoke, drink alcohol, or use  illegaldrugs. Some items may interact with your medicine. What should I watch for while using this medication? Visit your doctor for checks on your progress. This drug may make you feel generally unwell. This is not uncommon, as chemotherapy can affect healthy cells as well as cancer cells. Report any side effects. Continue your course oftreatment even though you feel ill unless your doctor tells you to stop. In some cases, you may be given additional medicines to help with side effects.Follow all directions for their use. Call your doctor or health care professional for advice if you get a fever, chills or sore throat, or other symptoms of a cold or  flu. Do not treat yourself. This drug decreases your body's ability to fight infections. Try toavoid being around people who are sick. This medicine may increase your risk to bruise or bleed. Call your doctor orhealth care professional if you notice any unusual bleeding. Be careful brushing and flossing your teeth or using a toothpick because you may get an infection or bleed more easily. If you have any dental work done,tell your dentist you are receiving this medicine. Avoid taking products that contain aspirin, acetaminophen, ibuprofen, naproxen, or ketoprofen unless instructed by your doctor. These medicines may hide afever. Do not become pregnant while taking this medicine. Women should inform their doctor if they wish to become pregnant or think they might be pregnant. There is a potential for serious side effects to an unborn child. Talk to your health care professional or pharmacist for more information. Do not breast-feed aninfant while taking this medicine. Men should inform their doctor if they wish to father a child. This medicinemay lower sperm counts. Do not treat diarrhea with over the counter products. Contact your doctor ifyou have diarrhea that lasts more than 2 days or if it is severe and watery. This medicine can make you more sensitive to the sun. Keep out of the sun. If you cannot avoid being in the sun, wear protective clothing and use sunscreen.Do not use sun lamps or tanning beds/booths. What side effects may I notice from receiving this medication? Side effects that you should report to your doctor or health care professionalas soon as possible: allergic reactions like skin rash, itching or hives, swelling of the face, lips, or tongue low blood counts - this medicine may decrease the number of white blood cells, red blood cells and platelets. You may be at increased risk for infections and bleeding. signs of infection - fever or chills, cough, sore throat, pain or difficulty  passing urine signs of decreased platelets or bleeding - bruising, pinpoint red spots on the skin, black, tarry stools, blood in the urine signs of decreased red blood cells - unusually weak or tired, fainting spells, lightheadedness breathing problems changes in vision chest pain mouth sores nausea and vomiting pain, swelling, redness at site where injected pain, tingling, numbness in the hands or feet redness, swelling, or sores on hands or feet stomach pain unusual bleeding Side effects that usually do not require medical attention (report to yourdoctor or health care professional if they continue or are bothersome): changes in finger or toe nails diarrhea dry or itchy skin hair loss headache loss of appetite sensitivity of eyes to the light stomach upset unusually teary eyes This list may not describe all possible side effects. Call your doctor for medical advice about side effects. You may report side effects to FDA at1-800-FDA-1088. Where should I keep my medication? This drug is given in a hospital or  clinic and will not be stored at home. NOTE: This sheet is a summary. It may not cover all possible information. If you have questions about this medicine, talk to your doctor, pharmacist, orhealth care provider.  2022 Elsevier/Gold Standard (2019-01-17 15:00:03)

## 2020-10-14 NOTE — Progress Notes (Signed)
  [facility] OFFICE PROGRESS NOTE   Diagnosis: Pancreas cancer  INTERVAL HISTORY:   Kirsten [name] returns as scheduled.  She completed cycle 7 FOLFIRINOX 09/30/2020.  She denies nausea/vomiting.  No mouth sores.  No diarrhea.  Cold sensitivity lasted about 5 days.  Hands and feet do not feel numb.  Digits 2 and 3 on both hands feel cold to touch to her.  No associated pain/discomfort.  She notes hyperpigmentation on both palms.  Pain continues to be improved.  She has discontinued the pain patch and is not requiring breakthrough pain medication.  Objective:  Vital signs in last 24 hours:  Blood pressure (!) 156/87, pulse 92, temperature 98.3 F (36.8 C), temperature source Oral, resp. rate 18, height '5\' 3"'$  (1.6 m), weight 123 lb 3.2 oz (55.9 kg), SpO2 100 %.    HEENT: No thrush or ulcers. Resp: Lungs clear bilaterally. Cardio: Regular rate and rhythm. GI: Abdomen soft and nontender.  No hepatomegaly. Vascular: No leg edema. Neuro: Moderate decrease in vibratory sense over the fingertips per tuning fork exam. Skin: Palms with scattered areas of hyperpigmentation.  No skin breakdown.  Hands/fingers warm to touch.  No perceptible temperature difference noted by my examination. Port-A-Cath without erythema.   Lab Results:  Lab Results  Component Value Date   WBC 10.0 10/14/2020   HGB 11.6 (L) 10/14/2020   HCT 36.1 10/14/2020   MCV 90.5 10/14/2020   PLT 200 10/14/2020   NEUTROABS 4.9 10/14/2020    Imaging:  No results found.  Medications: I have reviewed the patient's current medications.  Assessment/Plan: 1. Metastatic pancreas cancer Right upper quadrant ultrasound 06/17/2020-multiple hypoechoic liver lesions CT abdomen/pelvis 06/17/2020- pancreas tail mass, multiple liver metastases, enlarged periaortic nodes, right ovary metastasis?,  Nodularity at the left paracolic gutter and posterior/inferior stomach suggesting peritoneal tumor spread CT chest 06/17/2020-  scattered small pulmonary nodules consistent with metastatic disease Ultrasound-guided biopsy of a left liver mass 06/18/2020- adenocarcinoma, morphology compatible with metastatic pancreatic adenocarcinoma Cycle 1 FOLFOX 07/01/2020 Cycle 2 FOLFOX 07/16/2020 Cycle 3 FOLFOX 07/31/2020 Cycle 4 FOLFIRINOX 08/13/2020 Cycle 5 FOLFIRINOX 09/03/2020, Udenyca CTs 09/13/2020-unchanged nodule medial left lower lobe; stable nodal and likely peritoneal disease in the abdomen/pelvis; unchanged pancreatic tail mass; multifocal hepatic metastases, mildly progressive Cycle 6 FOLFIRINOX 09/16/2020, Oxaliplatin held Cycle 7 FOLFIRINOX 09/30/2020 Cycle 8 FOLFIRINOX 10/14/2020, oxaliplatin held 2.  Pain secondary to #1 3.  Weight loss 4.  Family history of pancreas cancer 5.  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  She has completed 7 cycles of FOLFIRINOX.  Plan to proceed with cycle 8 today as scheduled.  Oxaliplatin will be held due to neuropathy symptoms.  We reviewed the CBC from today.  Counts adequate to proceed with treatment.  She will return for lab, follow-up, cycle 9 FOLFIRINOX in 2 weeks.  She will contact the office in the interim with any problems.  Plan reviewed with Dr. Benay Spice.    Ned Card ANP/GNP-BC   10/14/2020  9:16 AM

## 2020-10-15 ENCOUNTER — Other Ambulatory Visit: Payer: Medicaid Other

## 2020-10-15 ENCOUNTER — Ambulatory Visit: Payer: Medicaid Other | Admitting: Oncology

## 2020-10-15 ENCOUNTER — Ambulatory Visit: Payer: Medicaid Other

## 2020-10-15 LAB — CANCER ANTIGEN 19-9: CA 19-9: 22088 U/mL — ABNORMAL HIGH (ref 0–35)

## 2020-10-16 ENCOUNTER — Inpatient Hospital Stay: Payer: Medicaid Other

## 2020-10-16 ENCOUNTER — Other Ambulatory Visit: Payer: Self-pay

## 2020-10-16 VITALS — BP 146/89 | HR 83 | Temp 98.4°F | Resp 18

## 2020-10-16 DIAGNOSIS — Z5111 Encounter for antineoplastic chemotherapy: Secondary | ICD-10-CM | POA: Diagnosis not present

## 2020-10-16 DIAGNOSIS — C252 Malignant neoplasm of tail of pancreas: Secondary | ICD-10-CM

## 2020-10-16 MED ORDER — PEGFILGRASTIM-BMEZ 6 MG/0.6ML ~~LOC~~ SOSY
6.0000 mg | PREFILLED_SYRINGE | Freq: Once | SUBCUTANEOUS | Status: AC
  Administered 2020-10-16: 6 mg via SUBCUTANEOUS

## 2020-10-16 MED ORDER — HEPARIN SOD (PORK) LOCK FLUSH 100 UNIT/ML IV SOLN
500.0000 [IU] | Freq: Once | INTRAVENOUS | Status: AC | PRN
  Administered 2020-10-16: 500 [IU]

## 2020-10-16 MED ORDER — SODIUM CHLORIDE 0.9% FLUSH
10.0000 mL | INTRAVENOUS | Status: DC | PRN
Start: 2020-10-16 — End: 2020-10-16
  Administered 2020-10-16: 10 mL

## 2020-10-16 NOTE — Patient Instructions (Signed)

## 2020-10-17 ENCOUNTER — Ambulatory Visit: Payer: Medicaid Other

## 2020-10-27 ENCOUNTER — Other Ambulatory Visit: Payer: Self-pay | Admitting: Oncology

## 2020-10-28 ENCOUNTER — Inpatient Hospital Stay: Payer: Medicaid Other

## 2020-10-28 ENCOUNTER — Other Ambulatory Visit: Payer: Self-pay

## 2020-10-28 ENCOUNTER — Inpatient Hospital Stay (HOSPITAL_BASED_OUTPATIENT_CLINIC_OR_DEPARTMENT_OTHER): Payer: Medicaid Other | Admitting: Oncology

## 2020-10-28 VITALS — BP 145/85 | HR 75 | Temp 97.8°F | Resp 18 | Ht 63.0 in | Wt 126.4 lb

## 2020-10-28 DIAGNOSIS — C252 Malignant neoplasm of tail of pancreas: Secondary | ICD-10-CM

## 2020-10-28 DIAGNOSIS — Z5111 Encounter for antineoplastic chemotherapy: Secondary | ICD-10-CM | POA: Diagnosis not present

## 2020-10-28 DIAGNOSIS — Z95828 Presence of other vascular implants and grafts: Secondary | ICD-10-CM

## 2020-10-28 LAB — CBC WITH DIFFERENTIAL (CANCER CENTER ONLY)
Abs Immature Granulocytes: 0.12 10*3/uL — ABNORMAL HIGH (ref 0.00–0.07)
Basophils Absolute: 0.1 10*3/uL (ref 0.0–0.1)
Basophils Relative: 1 %
Eosinophils Absolute: 0.2 10*3/uL (ref 0.0–0.5)
Eosinophils Relative: 3 %
HCT: 37.3 % (ref 36.0–46.0)
Hemoglobin: 11.7 g/dL — ABNORMAL LOW (ref 12.0–15.0)
Immature Granulocytes: 1 %
Lymphocytes Relative: 31 %
Lymphs Abs: 2.6 10*3/uL (ref 0.7–4.0)
MCH: 29.3 pg (ref 26.0–34.0)
MCHC: 31.4 g/dL (ref 30.0–36.0)
MCV: 93.3 fL (ref 80.0–100.0)
Monocytes Absolute: 1.3 10*3/uL — ABNORMAL HIGH (ref 0.1–1.0)
Monocytes Relative: 16 %
Neutro Abs: 4.1 10*3/uL (ref 1.7–7.7)
Neutrophils Relative %: 48 %
Platelet Count: 275 10*3/uL (ref 150–400)
RBC: 4 MIL/uL (ref 3.87–5.11)
RDW: 18.1 % — ABNORMAL HIGH (ref 11.5–15.5)
WBC Count: 8.4 10*3/uL (ref 4.0–10.5)
nRBC: 0 % (ref 0.0–0.2)

## 2020-10-28 LAB — CMP (CANCER CENTER ONLY)
ALT: 49 U/L — ABNORMAL HIGH (ref 0–44)
AST: 56 U/L — ABNORMAL HIGH (ref 15–41)
Albumin: 3.3 g/dL — ABNORMAL LOW (ref 3.5–5.0)
Alkaline Phosphatase: 321 U/L — ABNORMAL HIGH (ref 38–126)
Anion gap: 8 (ref 5–15)
BUN: 7 mg/dL — ABNORMAL LOW (ref 8–23)
CO2: 28 mmol/L (ref 22–32)
Calcium: 9.6 mg/dL (ref 8.9–10.3)
Chloride: 104 mmol/L (ref 98–111)
Creatinine: 0.88 mg/dL (ref 0.44–1.00)
GFR, Estimated: >60 mL/min (ref >60)
Glucose, Bld: 119 mg/dL — ABNORMAL HIGH (ref 70–99)
Potassium: 3.8 mmol/L (ref 3.5–5.1)
Sodium: 140 mmol/L (ref 135–145)
Total Bilirubin: 0.5 mg/dL (ref 0.3–1.2)
Total Protein: 7 g/dL (ref 6.5–8.1)

## 2020-10-28 MED ORDER — SODIUM CHLORIDE 0.9 % IV SOLN
2400.0000 mg/m2 | INTRAVENOUS | Status: DC
  Administered 2020-10-28: 3850 mg via INTRAVENOUS
  Filled 2020-10-28: qty 77

## 2020-10-28 MED ORDER — PALONOSETRON HCL INJECTION 0.25 MG/5ML
0.2500 mg | Freq: Once | INTRAVENOUS | Status: AC
  Administered 2020-10-28: 0.25 mg via INTRAVENOUS
  Filled 2020-10-28: qty 5

## 2020-10-28 MED ORDER — SODIUM CHLORIDE 0.9 % IV SOLN
10.0000 mg | Freq: Once | INTRAVENOUS | Status: AC
  Administered 2020-10-28: 10 mg via INTRAVENOUS
  Filled 2020-10-28: qty 1

## 2020-10-28 MED ORDER — SODIUM CHLORIDE 0.9% FLUSH
10.0000 mL | INTRAVENOUS | Status: DC | PRN
  Administered 2020-10-28: 10 mL

## 2020-10-28 MED ORDER — ATROPINE SULFATE 1 MG/ML IJ SOLN
0.5000 mg | Freq: Once | INTRAMUSCULAR | Status: AC | PRN
  Administered 2020-10-28: 0.5 mg via INTRAVENOUS
  Filled 2020-10-28: qty 1

## 2020-10-28 MED ORDER — SODIUM CHLORIDE 0.9 % IV SOLN
400.0000 mg/m2 | Freq: Once | INTRAVENOUS | Status: AC
  Administered 2020-10-28: 644 mg via INTRAVENOUS
  Filled 2020-10-28: qty 32.2

## 2020-10-28 MED ORDER — SODIUM CHLORIDE 0.9 % IV SOLN: Freq: Once | INTRAVENOUS | Status: AC

## 2020-10-28 MED ORDER — SODIUM CHLORIDE 0.9 % IV SOLN
150.0000 mg | Freq: Once | INTRAVENOUS | Status: AC
  Administered 2020-10-28: 150 mg via INTRAVENOUS
  Filled 2020-10-28: qty 5

## 2020-10-28 MED ORDER — SODIUM CHLORIDE 0.9 % IV SOLN
125.0000 mg/m2 | Freq: Once | INTRAVENOUS | Status: AC
  Administered 2020-10-28: 200 mg via INTRAVENOUS
  Filled 2020-10-28: qty 10

## 2020-10-28 NOTE — Progress Notes (Signed)
  [facility] OFFICE PROGRESS NOTE   Diagnosis: Pancreas cancer  INTERVAL HISTORY:   Ms. Clowney completed another cycle of chemotherapy on 10/14/2020.  Oxaliplatin was held secondary to neuropathy symptoms.  No nausea/vomiting or diarrhea.  The tip of the tongue is sore.  Neuropathy symptoms are partially improved.  She has a feeling that her fingers are "asleep "intermittently.  She is no longer taking pain medication.  Objective:  Vital signs in last 24 hours:  Blood pressure (!) 145/85, pulse 75, temperature 97.8 F (36.6 C), temperature source Oral, resp. rate 18, height '5\' 3"'$  (1.6 m), weight 126 lb 6.4 oz (57.3 kg), SpO2 99 %.    HEENT: No thrush or ulcers, hyperpigmentation of the tongue and mucosa Resp: Lungs clear bilaterally Cardio: Regular rate and rhythm GI: No hepatosplenomegaly, no mass, nontender Vascular: No leg edema  Skin: Hyperpigmentation of the hands  Portacath/PICC-without erythema  Lab Results:  Lab Results  Component Value Date   WBC 8.4 10/28/2020   HGB 11.7 (L) 10/28/2020   HCT 37.3 10/28/2020   MCV 93.3 10/28/2020   PLT 275 10/28/2020   NEUTROABS 4.1 10/28/2020    CMP  Lab Results  Component Value Date   NA 140 10/28/2020   K 3.8 10/28/2020   CL 104 10/28/2020   CO2 28 10/28/2020   GLUCOSE 119 (H) 10/28/2020   BUN 7 (L) 10/28/2020   CREATININE 0.88 10/28/2020   CALCIUM 9.6 10/28/2020   PROT 7.0 10/28/2020   ALBUMIN 3.3 (L) 10/28/2020   AST 56 (H) 10/28/2020   ALT 49 (H) 10/28/2020   ALKPHOS 321 (H) 10/28/2020   BILITOT 0.5 10/28/2020   GFRNONAA >60 10/28/2020    Medications: I have reviewed the patient's current medications.   Assessment/Plan: Metastatic pancreas cancer Right upper quadrant ultrasound 06/17/2020-multiple hypoechoic liver lesions CT abdomen/pelvis 06/17/2020- pancreas tail mass, multiple liver metastases, enlarged periaortic nodes, right ovary metastasis?,  Nodularity at the left paracolic gutter  and posterior/inferior stomach suggesting peritoneal tumor spread CT chest 06/17/2020- scattered small pulmonary nodules consistent with metastatic disease Ultrasound-guided biopsy of a left liver mass 06/18/2020- adenocarcinoma, morphology compatible with metastatic pancreatic adenocarcinoma Cycle 1 FOLFOX 07/01/2020 Cycle 2 FOLFOX 07/16/2020 Cycle 3 FOLFOX 07/31/2020 Cycle 4 FOLFIRINOX 08/13/2020 Cycle 5 FOLFIRINOX 09/03/2020, Udenyca CTs 09/13/2020-unchanged nodule medial left lower lobe; stable nodal and likely peritoneal disease in the abdomen/pelvis; unchanged pancreatic tail mass; multifocal hepatic metastases, mildly progressive Cycle 6 FOLFIRINOX 09/16/2020, Oxaliplatin held Cycle 7 FOLFIRINOX 09/30/2020 Cycle 8 FOLFIRINOX 10/14/2020, oxaliplatin held Cycle 9 FOLFIRINOX 10/28/2020, oxaliplatin held 2.  Pain secondary to #1 3.  Weight loss 4.  Family history of pancreas cancer 5.  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 Coppler appears stable.  She continues to have symptoms of oxaliplatin neuropathy.  Oxaliplatin will be held again today.  She will completed another cycle of FOLFIRI with G-CSF support beginning today.  Ms. Aguino will return for an office visit and chemotherapy in 2 weeks.  She will undergo a restaging CT evaluation after cycle 10 FOLFIRINOX.  The CA 19-9 was lower on 10/14/2020. We will repeat the CA 19-9 when she returns in 2 weeks.  [name], MD  10/28/2020  10:29 AM

## 2020-10-28 NOTE — Patient Instructions (Signed)
[facility]  Discharge Instructions: Thank you for choosing [facility] to provide your oncology and hematology care.   If you have a lab appointment with the [facility], please go directly to the [facility] and check in at the registration area.   Wear comfortable clothing and clothing appropriate for easy access to any Portacath or PICC line.   We strive to give you quality time with your provider. You may need to reschedule your appointment if you arrive late (15 or more minutes).  Arriving late affects you and other patients whose appointments are after yours.  Also, if you miss three or more appointments without notifying the office, you may be dismissed from the clinic at the provider's discretion.      For prescription refill requests, have your pharmacy contact our office and allow 72 hours for refills to be completed.    Today you received the following chemotherapy and/or immunotherapy agents: irinotecan, leucovorin, floururacil.    To help prevent nausea and vomiting after your treatment, we encourage you to take your nausea medication as directed.  BELOW ARE SYMPTOMS THAT SHOULD BE REPORTED IMMEDIATELY: *FEVER GREATER THAN 100.4 F (38 C) OR HIGHER *CHILLS OR SWEATING *NAUSEA AND VOMITING THAT IS NOT CONTROLLED WITH YOUR NAUSEA MEDICATION *UNUSUAL SHORTNESS OF BREATH *UNUSUAL BRUISING OR BLEEDING *URINARY PROBLEMS (pain or burning when urinating, or frequent urination) *BOWEL PROBLEMS (unusual diarrhea, constipation, pain near the anus) TENDERNESS IN MOUTH AND THROAT WITH OR WITHOUT PRESENCE OF ULCERS (sore throat, sores in mouth, or a toothache) UNUSUAL RASH, SWELLING OR PAIN  UNUSUAL VAGINAL DISCHARGE OR ITCHING   Items with * indicate a potential emergency and should be followed up as soon as possible or go to the Emergency Department if any problems should occur.  Please show the CHEMOTHERAPY ALERT CARD or  IMMUNOTHERAPY ALERT CARD at check-in to the Emergency Department and triage nurse.  Should you have questions after your visit or need to cancel or reschedule your appointment, please contact [facility]  Dept: [phone]  and follow the prompts.  Office hours are 8:00 a.m. to 4:30 p.m. Monday - Friday. Please note that voicemails left after 4:00 p.m. may not be returned until the following business day.  We are closed weekends and major holidays. You have access to a nurse at all times for urgent questions. Please call the main number to the clinic Dept: [phone] and follow the prompts.   For any non-urgent questions, you may also contact your provider using MyChart. We now offer e-Visits for anyone 40 and older to request care online for non-urgent symptoms. For details visit [email or web address].   Also download the MyChart app! Go to the app store, search "MyChart", open the app, select [facility], and log in with your MyChart username and password.  Due to Covid, a mask is required upon entering the hospital/clinic. If you do not have a mask, one will be given to you upon arrival. For doctor visits, patients may have 1 support person aged 60 or older with them. For treatment visits, patients cannot have anyone with them due to current Covid guidelines and our immunocompromised population.   Irinotecan injection What is this medication? IRINOTECAN (ir in oh TEE kan ) is a chemotherapy drug. It is used to treatcolon and rectal cancer. This medicine may be used for other purposes; ask your health care provider orpharmacist if you have questions. COMMON BRAND NAME(S): Camptosar What should  I tell my care team before I take this medication? They need to know if you have any of these conditions: dehydration diarrhea infection (especially a virus infection such as chickenpox, cold sores, or herpes) liver disease low blood counts, like low white cell, platelet,  or red cell counts low levels of calcium, magnesium, or potassium in the blood recent or ongoing radiation therapy an unusual or allergic reaction to irinotecan, other medicines, foods, dyes, or preservatives pregnant or trying to get pregnant breast-feeding How should I use this medication? This drug is given as an infusion into a vein. It is administered in a hospitalor clinic by a specially trained health care professional. Talk to your pediatrician regarding the use of this medicine in children.Special care may be needed. Overdosage: If you think you have taken too much of this medicine contact apoison control center or emergency room at once. NOTE: This medicine is only for you. Do not share this medicine with others. What if I miss a dose? It is important not to miss your dose. Call your doctor or health careprofessional if you are unable to keep an appointment. What may interact with this medication? Do not take this medicine with any of the following medications: cobicistat itraconazole This medicine may interact with the following medications: antiviral medicines for HIV or AIDS certain antibiotics like rifampin or rifabutin certain medicines for fungal infections like ketoconazole, posaconazole, and voriconazole certain medicines for seizures like carbamazepine, phenobarbital, phenotoin clarithromycin gemfibrozil nefazodone [facility]. John's Wort This list may not describe all possible interactions. Give your health care provider a list of all the medicines, herbs, non-prescription drugs, or dietary supplements you use. Also tell them if you smoke, drink alcohol, or use illegaldrugs. Some items may interact with your medicine. What should I watch for while using this medication? Your condition will be monitored carefully while you are receiving this medicine. You will need important blood work done while you are taking thismedicine. This drug may make you feel generally unwell. This  is not uncommon, as chemotherapy can affect healthy cells as well as cancer cells. Report any side effects. Continue your course of treatment even though you feel ill unless yourdoctor tells you to stop. In some cases, you may be given additional medicines to help with side effects.Follow all directions for their use. You may get drowsy or dizzy. Do not drive, use machinery, or do anything that needs mental alertness until you know how this medicine affects you. Do not stand or sit up quickly, especially if you are an older patient. This reducesthe risk of dizzy or fainting spells. Call your health care professional for advice if you get a fever, chills, or sore throat, or other symptoms of a cold or flu. Do not treat yourself. This medicine decreases your body's ability to fight infections. Try to avoid beingaround people who are sick. Avoid taking products that contain aspirin, acetaminophen, ibuprofen, naproxen, or ketoprofen unless instructed by your doctor. These medicines may hide afever. This medicine may increase your risk to bruise or bleed. Call your doctor orhealth care professional if you notice any unusual bleeding. Be careful brushing and flossing your teeth or using a toothpick because you may get an infection or bleed more easily. If you have any dental work done,tell your dentist you are receiving this medicine. Do not become pregnant while taking this medicine or for 6 months after stopping it. Women should inform their health care professional if they wish to become pregnant or think they  might be pregnant. Men should not father a child while taking this medicine and for 3 months after stopping it. There is potential for serious side effects to an unborn child. Talk to your health careprofessional for more information. Do not breast-feed an infant while taking this medicine or for 7 days afterstopping it. This medicine has caused ovarian failure in some women. This medicine may make it  more difficult to get pregnant. Talk to your health care professional if [place] concerned about your fertility. This medicine has caused decreased sperm counts in some men. This may make it more difficult to father a child. Talk to your health care professional if [place] concerned about your fertility. What side effects may I notice from receiving this medication? Side effects that you should report to your doctor or health care professionalas soon as possible: allergic reactions like skin rash, itching or hives, swelling of the face, lips, or tongue chest pain diarrhea flushing, runny nose, sweating during infusion low blood counts - this medicine may decrease the number of white blood cells, red blood cells and platelets. You may be at increased risk for infections and bleeding. nausea, vomiting pain, swelling, warmth in the leg signs of decreased platelets or bleeding - bruising, pinpoint red spots on the skin, black, tarry stools, blood in the urine signs of infection - fever or chills, cough, sore throat, pain or difficulty passing urine signs of decreased red blood cells - unusually weak or tired, fainting spells, lightheadedness Side effects that usually do not require medical attention (report to yourdoctor or health care professional if they continue or are bothersome): constipation hair loss headache loss of appetite mouth sores stomach pain This list may not describe all possible side effects. Call your doctor for medical advice about side effects. You may report side effects to FDA at1-800-FDA-1088. Where should I keep my medication? This drug is given in a hospital or clinic and will not be stored at home. NOTE: This sheet is a summary. It may not cover all possible information. If you have questions about this medicine, talk to your doctor, pharmacist, orhealth care provider.  2022 Elsevier/Gold Standard (2019-01-17 17:46:13)   Leucovorin injection What is this  medication? LEUCOVORIN (loo koe VOR in) is used to prevent or treat the harmful effects of some medicines. This medicine is used to treat anemia caused by a low amount of folic acid in the body. It is also used with 5-fluorouracil (5-FU) to treatcolon cancer. This medicine may be used for other purposes; ask your health care provider orpharmacist if you have questions. What should I tell my care team before I take this medication? They need to know if you have any of these conditions: anemia from low levels of vitamin B-12 in the blood an unusual or allergic reaction to leucovorin, folic acid, other medicines, foods, dyes, or preservatives pregnant or trying to get pregnant breast-feeding How should I use this medication? This medicine is for injection into a muscle or into a vein. It is given by ahealth care professional in a hospital or clinic setting. Talk to your pediatrician regarding the use of this medicine in children.Special care may be needed. Overdosage: If you think you have taken too much of this medicine contact apoison control center or emergency room at once. NOTE: This medicine is only for you. Do not share this medicine with others. What if I miss a dose? This does not apply. What may interact with this medication? capecitabine fluorouracil phenobarbital phenytoin primidone  trimethoprim-sulfamethoxazole This list may not describe all possible interactions. Give your health care provider a list of all the medicines, herbs, non-prescription drugs, or dietary supplements you use. Also tell them if you smoke, drink alcohol, or use illegaldrugs. Some items may interact with your medicine. What should I watch for while using this medication? Your condition will be monitored carefully while you are receiving thismedicine. This medicine may increase the side effects of 5-fluorouracil, 5-FU. Tell your doctor or health care professional if you have diarrhea or mouth sores that donot  get better or that get worse. What side effects may I notice from receiving this medication? Side effects that you should report to your doctor or health care professionalas soon as possible: allergic reactions like skin rash, itching or hives, swelling of the face, lips, or tongue breathing problems fever, infection mouth sores unusual bleeding or bruising unusually weak or tired Side effects that usually do not require medical attention (report to yourdoctor or health care professional if they continue or are bothersome): constipation or diarrhea loss of appetite nausea, vomiting This list may not describe all possible side effects. Call your doctor for medical advice about side effects. You may report side effects to FDA at1-800-FDA-1088. Where should I keep my medication? This drug is given in a hospital or clinic and will not be stored at home. NOTE: This sheet is a summary. It may not cover all possible information. If you have questions about this medicine, talk to your doctor, pharmacist, orhealth care provider.  2022 Elsevier/Gold Standard (2007-08-23 16:50:29)  Fluorouracil, 5-FU injection What is this medication? FLUOROURACIL, 5-FU (flure oh YOOR a sil) is a chemotherapy drug. It slows the growth of cancer cells. This medicine is used to treat many types of cancer like breast cancer, colon or rectal cancer, pancreatic cancer, and stomachcancer. This medicine may be used for other purposes; ask your health care provider orpharmacist if you have questions. COMMON BRAND NAME(S): Adrucil What should I tell my care team before I take this medication? They need to know if you have any of these conditions: blood disorders dihydropyrimidine dehydrogenase (DPD) deficiency infection (especially a virus infection such as chickenpox, cold sores, or herpes) kidney disease liver disease malnourished, poor nutrition recent or ongoing radiation therapy an unusual or allergic reaction to  fluorouracil, other chemotherapy, other medicines, foods, dyes, or preservatives pregnant or trying to get pregnant breast-feeding How should I use this medication? This drug is given as an infusion or injection into a vein. It is administeredin a hospital or clinic by a specially trained health care professional. Talk to your pediatrician regarding the use of this medicine in children.Special care may be needed. Overdosage: If you think you have taken too much of this medicine contact apoison control center or emergency room at once. NOTE: This medicine is only for you. Do not share this medicine with others. What if I miss a dose? It is important not to miss your dose. Call your doctor or health careprofessional if you are unable to keep an appointment. What may interact with this medication? Do not take this medicine with any of the following medications: live virus vaccines This medicine may also interact with the following medications: medicines that treat or prevent blood clots like warfarin, enoxaparin, and dalteparin This list may not describe all possible interactions. Give your health care provider a list of all the medicines, herbs, non-prescription drugs, or dietary supplements you use. Also tell them if you smoke, drink alcohol, or use  illegaldrugs. Some items may interact with your medicine. What should I watch for while using this medication? Visit your doctor for checks on your progress. This drug may make you feel generally unwell. This is not uncommon, as chemotherapy can affect healthy cells as well as cancer cells. Report any side effects. Continue your course oftreatment even though you feel ill unless your doctor tells you to stop. In some cases, you may be given additional medicines to help with side effects.Follow all directions for their use. Call your doctor or health care professional for advice if you get a fever, chills or sore throat, or other symptoms of a cold or  flu. Do not treat yourself. This drug decreases your body's ability to fight infections. Try toavoid being around people who are sick. This medicine may increase your risk to bruise or bleed. Call your doctor orhealth care professional if you notice any unusual bleeding. Be careful brushing and flossing your teeth or using a toothpick because you may get an infection or bleed more easily. If you have any dental work done,tell your dentist you are receiving this medicine. Avoid taking products that contain aspirin, acetaminophen, ibuprofen, naproxen, or ketoprofen unless instructed by your doctor. These medicines may hide afever. Do not become pregnant while taking this medicine. Women should inform their doctor if they wish to become pregnant or think they might be pregnant. There is a potential for serious side effects to an unborn child. Talk to your health care professional or pharmacist for more information. Do not breast-feed aninfant while taking this medicine. Men should inform their doctor if they wish to father a child. This medicinemay lower sperm counts. Do not treat diarrhea with over the counter products. Contact your doctor ifyou have diarrhea that lasts more than 2 days or if it is severe and watery. This medicine can make you more sensitive to the sun. Keep out of the sun. If you cannot avoid being in the sun, wear protective clothing and use sunscreen.Do not use sun lamps or tanning beds/booths. What side effects may I notice from receiving this medication? Side effects that you should report to your doctor or health care professionalas soon as possible: allergic reactions like skin rash, itching or hives, swelling of the face, lips, or tongue low blood counts - this medicine may decrease the number of white blood cells, red blood cells and platelets. You may be at increased risk for infections and bleeding. signs of infection - fever or chills, cough, sore throat, pain or difficulty  passing urine signs of decreased platelets or bleeding - bruising, pinpoint red spots on the skin, black, tarry stools, blood in the urine signs of decreased red blood cells - unusually weak or tired, fainting spells, lightheadedness breathing problems changes in vision chest pain mouth sores nausea and vomiting pain, swelling, redness at site where injected pain, tingling, numbness in the hands or feet redness, swelling, or sores on hands or feet stomach pain unusual bleeding Side effects that usually do not require medical attention (report to yourdoctor or health care professional if they continue or are bothersome): changes in finger or toe nails diarrhea dry or itchy skin hair loss headache loss of appetite sensitivity of eyes to the light stomach upset unusually teary eyes This list may not describe all possible side effects. Call your doctor for medical advice about side effects. You may report side effects to FDA at1-800-FDA-1088. Where should I keep my medication? This drug is given in a hospital or  clinic and will not be stored at home. NOTE: This sheet is a summary. It may not cover all possible information. If you have questions about this medicine, talk to your doctor, pharmacist, orhealth care provider.  2022 Elsevier/Gold Standard (2019-01-17 15:00:03)

## 2020-10-28 NOTE — Patient Instructions (Signed)
Implanted Port Home Guide An implanted port is a device that is placed under the skin. It is usually placed in the chest. The device can be used to give IV medicine, to take blood, or for dialysis. You may have an implanted port if: You need IV medicine that would be irritating to the small veins in your hands or arms. You need IV medicines, such as antibiotics, for a long period of time. You need IV nutrition for a long period of time. You need dialysis. When you have a port, your health care provider can choose to use the port instead of veins in your arms for these procedures. You may have fewer limitations when using a port than you would if you used other types of long-term IVs, and you will likely be able to return to normal activities afteryour incision heals. An implanted port has two main parts: Reservoir. The reservoir is the part where a needle is inserted to give medicines or draw blood. The reservoir is round. After it is placed, it appears as a small, raised area under your skin. Catheter. The catheter is a thin, flexible tube that connects the reservoir to a vein. Medicine that is inserted into the reservoir goes into the catheter and then into the vein. How is my port accessed? To access your port: A numbing cream may be placed on the skin over the port site. Your health care provider will put on a mask and sterile gloves. The skin over your port will be cleaned carefully with a germ-killing soap and allowed to dry. Your health care provider will gently pinch the port and insert a needle into it. Your health care provider will check for a blood return to make sure the port is in the vein and is not clogged. If your port needs to remain accessed to get medicine continuously (constant infusion), your health care provider will place a clear bandage (dressing) over the needle site. The dressing and needle will need to be changed every week, or as told by your health care provider. What  is flushing? Flushing helps keep the port from getting clogged. Follow instructions from your health care provider about how and when to flush the port. Ports are usually flushed with saline solution or a medicine called heparin. The need for flushing will depend on how the port is used: If the port is only used from time to time to give medicines or draw blood, the port may need to be flushed: Before and after medicines have been given. Before and after blood has been drawn. As part of routine maintenance. Flushing may be recommended every 4-6 weeks. If a constant infusion is running, the port may not need to be flushed. Throw away any syringes in a disposal container that is meant for sharp items (sharps container). You can buy a sharps container from a pharmacy, or you can make one by using an empty hard plastic bottle with a cover. How long will my port stay implanted? The port can stay in for as long as your health care provider thinks it is needed. When it is time for the port to come out, a surgery will be done to remove it. The surgery will be similar to the procedure that was done to putthe port in. Follow these instructions at home:  Flush your port as told by your health care provider. If you need an infusion over several days, follow instructions from your health care provider about how to take   care of your port site. Make sure you: Wash your hands with soap and water before you change your dressing. If soap and water are not available, use alcohol-based hand sanitizer. Change your dressing as told by your health care provider. Place any used dressings or infusion bags into a plastic bag. Throw that bag in the trash. Keep the dressing that covers the needle clean and dry. Do not get it wet. Do not use scissors or sharp objects near the tube. Keep the tube clamped, unless it is being used. Check your port site every day for signs of infection. Check for: Redness, swelling, or  pain. Fluid or blood. Pus or a bad smell. Protect the skin around the port site. Avoid wearing bra straps that rub or irritate the site. Protect the skin around your port from seat belts. Place a soft pad over your chest if needed. Bathe or shower as told by your health care provider. The site may get wet as long as you are not actively receiving an infusion. Return to your normal activities as told by your health care provider. Ask your health care provider what activities are safe for you. Carry a medical alert card or wear a medical alert bracelet at all times. This will let health care providers know that you have an implanted port in case of an emergency. Get help right away if: You have redness, swelling, or pain at the port site. You have fluid or blood coming from your port site. You have pus or a bad smell coming from the port site. You have a fever. Summary Implanted ports are usually placed in the chest for long-term IV access. Follow instructions from your health care provider about flushing the port and changing bandages (dressings). Take care of the area around your port by avoiding clothing that puts pressure on the area, and by watching for signs of infection. Protect the skin around your port from seat belts. Place a soft pad over your chest if needed. Get help right away if you have a fever or you have redness, swelling, pain, drainage, or a bad smell at the port site. This information is not intended to replace advice given to you by your health care provider. Make sure you discuss any questions you have with your healthcare provider. Document Revised: 07/03/2019 Document Reviewed: 07/03/2019 Elsevier Patient Education  2022 [employer]  

## 2020-10-30 ENCOUNTER — Other Ambulatory Visit: Payer: Self-pay

## 2020-10-30 ENCOUNTER — Ambulatory Visit: Payer: Medicaid Other

## 2020-10-30 ENCOUNTER — Inpatient Hospital Stay: Payer: Medicaid Other

## 2020-10-30 VITALS — BP 127/89 | HR 84 | Temp 98.3°F | Resp 20

## 2020-10-30 DIAGNOSIS — C252 Malignant neoplasm of tail of pancreas: Secondary | ICD-10-CM

## 2020-10-30 DIAGNOSIS — Z5111 Encounter for antineoplastic chemotherapy: Secondary | ICD-10-CM | POA: Diagnosis not present

## 2020-10-30 MED ORDER — SODIUM CHLORIDE 0.9% FLUSH
10.0000 mL | INTRAVENOUS | Status: DC | PRN
Start: 2020-10-30 — End: 2020-10-30
  Administered 2020-10-30: 10 mL

## 2020-10-30 MED ORDER — PEGFILGRASTIM-BMEZ 6 MG/0.6ML ~~LOC~~ SOSY
6.0000 mg | PREFILLED_SYRINGE | Freq: Once | SUBCUTANEOUS | Status: AC
  Administered 2020-10-30: 6 mg via SUBCUTANEOUS

## 2020-10-30 MED ORDER — HEPARIN SOD (PORK) LOCK FLUSH 100 UNIT/ML IV SOLN
500.0000 [IU] | Freq: Once | INTRAVENOUS | Status: AC | PRN
  Administered 2020-10-30: 500 [IU]

## 2020-10-30 NOTE — Patient Instructions (Signed)

## 2020-11-04 ENCOUNTER — Other Ambulatory Visit: Payer: Self-pay | Admitting: Oncology

## 2020-11-09 ENCOUNTER — Other Ambulatory Visit: Payer: Self-pay | Admitting: Oncology

## 2020-11-11 ENCOUNTER — Inpatient Hospital Stay: Payer: Medicaid Other

## 2020-11-11 ENCOUNTER — Inpatient Hospital Stay (HOSPITAL_BASED_OUTPATIENT_CLINIC_OR_DEPARTMENT_OTHER): Payer: Medicaid Other | Admitting: Oncology

## 2020-11-11 ENCOUNTER — Inpatient Hospital Stay: Payer: Medicaid Other | Attending: Oncology

## 2020-11-11 ENCOUNTER — Other Ambulatory Visit: Payer: Self-pay

## 2020-11-11 VITALS — BP 147/91 | HR 97 | Temp 97.8°F | Resp 20 | Ht 63.0 in | Wt 128.6 lb

## 2020-11-11 DIAGNOSIS — Z5111 Encounter for antineoplastic chemotherapy: Secondary | ICD-10-CM | POA: Insufficient documentation

## 2020-11-11 DIAGNOSIS — C252 Malignant neoplasm of tail of pancreas: Secondary | ICD-10-CM | POA: Diagnosis present

## 2020-11-11 DIAGNOSIS — C787 Secondary malignant neoplasm of liver and intrahepatic bile duct: Secondary | ICD-10-CM | POA: Diagnosis not present

## 2020-11-11 DIAGNOSIS — D701 Agranulocytosis secondary to cancer chemotherapy: Secondary | ICD-10-CM | POA: Diagnosis not present

## 2020-11-11 LAB — CBC WITH DIFFERENTIAL (CANCER CENTER ONLY)
Abs Immature Granulocytes: 0.09 10*3/uL — ABNORMAL HIGH (ref 0.00–0.07)
Basophils Absolute: 0.1 10*3/uL (ref 0.0–0.1)
Basophils Relative: 1 %
Eosinophils Absolute: 0.3 10*3/uL (ref 0.0–0.5)
Eosinophils Relative: 3 %
HCT: 36.6 % (ref 36.0–46.0)
Hemoglobin: 11.4 g/dL — ABNORMAL LOW (ref 12.0–15.0)
Immature Granulocytes: 1 %
Lymphocytes Relative: 33 %
Lymphs Abs: 2.5 10*3/uL (ref 0.7–4.0)
MCH: 29.5 pg (ref 26.0–34.0)
MCHC: 31.1 g/dL (ref 30.0–36.0)
MCV: 94.6 fL (ref 80.0–100.0)
Monocytes Absolute: 1.1 10*3/uL — ABNORMAL HIGH (ref 0.1–1.0)
Monocytes Relative: 14 %
Neutro Abs: 3.6 10*3/uL (ref 1.7–7.7)
Neutrophils Relative %: 48 %
Platelet Count: 258 10*3/uL (ref 150–400)
RBC: 3.87 MIL/uL (ref 3.87–5.11)
RDW: 17.5 % — ABNORMAL HIGH (ref 11.5–15.5)
WBC Count: 7.6 10*3/uL (ref 4.0–10.5)
nRBC: 0 % (ref 0.0–0.2)

## 2020-11-11 LAB — CMP (CANCER CENTER ONLY)
ALT: 46 U/L — ABNORMAL HIGH (ref 0–44)
AST: 43 U/L — ABNORMAL HIGH (ref 15–41)
Albumin: 3.5 g/dL (ref 3.5–5.0)
Alkaline Phosphatase: 257 U/L — ABNORMAL HIGH (ref 38–126)
Anion gap: 8 (ref 5–15)
BUN: 9 mg/dL (ref 8–23)
CO2: 27 mmol/L (ref 22–32)
Calcium: 10 mg/dL (ref 8.9–10.3)
Chloride: 106 mmol/L (ref 98–111)
Creatinine: 0.78 mg/dL (ref 0.44–1.00)
GFR, Estimated: >60 mL/min (ref >60)
Glucose, Bld: 149 mg/dL — ABNORMAL HIGH (ref 70–99)
Potassium: 3.8 mmol/L (ref 3.5–5.1)
Sodium: 141 mmol/L (ref 135–145)
Total Bilirubin: 0.5 mg/dL (ref 0.3–1.2)
Total Protein: 6.8 g/dL (ref 6.5–8.1)

## 2020-11-11 MED ORDER — SODIUM CHLORIDE 0.9 % IV SOLN
400.0000 mg/m2 | Freq: Once | INTRAVENOUS | Status: AC
  Administered 2020-11-11: 644 mg via INTRAVENOUS
  Filled 2020-11-11: qty 32.2

## 2020-11-11 MED ORDER — PALONOSETRON HCL INJECTION 0.25 MG/5ML
0.2500 mg | Freq: Once | INTRAVENOUS | Status: AC
  Administered 2020-11-11: 0.25 mg via INTRAVENOUS
  Filled 2020-11-11: qty 5

## 2020-11-11 MED ORDER — SODIUM CHLORIDE 0.9 % IV SOLN
150.0000 mg | Freq: Once | INTRAVENOUS | Status: AC
  Administered 2020-11-11: 150 mg via INTRAVENOUS
  Filled 2020-11-11: qty 150

## 2020-11-11 MED ORDER — SODIUM CHLORIDE 0.9 % IV SOLN
125.0000 mg/m2 | Freq: Once | INTRAVENOUS | Status: AC
  Administered 2020-11-11: 200 mg via INTRAVENOUS
  Filled 2020-11-11: qty 10

## 2020-11-11 MED ORDER — SODIUM CHLORIDE 0.9 % IV SOLN
2400.0000 mg/m2 | INTRAVENOUS | Status: DC
  Administered 2020-11-11: 3850 mg via INTRAVENOUS
  Filled 2020-11-11: qty 77

## 2020-11-11 MED ORDER — SODIUM CHLORIDE 0.9 % IV SOLN: INTRAVENOUS | Status: DC

## 2020-11-11 MED ORDER — SODIUM CHLORIDE 0.9 % IV SOLN
10.0000 mg | Freq: Once | INTRAVENOUS | Status: AC
  Administered 2020-11-11: 10 mg via INTRAVENOUS
  Filled 2020-11-11: qty 10

## 2020-11-11 MED ORDER — ATROPINE SULFATE 1 MG/ML IJ SOLN
0.5000 mg | Freq: Once | INTRAMUSCULAR | Status: AC | PRN
  Administered 2020-11-11: 0.5 mg via INTRAVENOUS
  Filled 2020-11-11: qty 1

## 2020-11-11 NOTE — Patient Instructions (Addendum)
[facility]  Discharge Instructions: Thank you for choosing [facility] to provide your oncology and hematology care.   If you have a lab appointment with the [facility], please go directly to the [facility] and check in at the registration area.   Wear comfortable clothing and clothing appropriate for easy access to any Portacath or PICC line.   We strive to give you quality time with your provider. You may need to reschedule your appointment if you arrive late (15 or more minutes).  Arriving late affects you and other patients whose appointments are after yours.  Also, if you miss three or more appointments without notifying the office, you may be dismissed from the clinic at the provider's discretion.      For prescription refill requests, have your pharmacy contact our office and allow 72 hours for refills to be completed.    Today you received the following chemotherapy and/or immunotherapy agents irinotecan, leucovorin, 61fu       To help prevent nausea and vomiting after your treatment, we encourage you to take your nausea medication as directed.  BELOW ARE SYMPTOMS THAT SHOULD BE REPORTED IMMEDIATELY: *FEVER GREATER THAN 100.4 F (38 C) OR HIGHER *CHILLS OR SWEATING *NAUSEA AND VOMITING THAT IS NOT CONTROLLED WITH YOUR NAUSEA MEDICATION *UNUSUAL SHORTNESS OF BREATH *UNUSUAL BRUISING OR BLEEDING *URINARY PROBLEMS (pain or burning when urinating, or frequent urination) *BOWEL PROBLEMS (unusual diarrhea, constipation, pain near the anus) TENDERNESS IN MOUTH AND THROAT WITH OR WITHOUT PRESENCE OF ULCERS (sore throat, sores in mouth, or a toothache) UNUSUAL RASH, SWELLING OR PAIN  UNUSUAL VAGINAL DISCHARGE OR ITCHING   Items with * indicate a potential emergency and should be followed up as soon as possible or go to the Emergency Department if any problems should occur.  Please show the CHEMOTHERAPY ALERT CARD or IMMUNOTHERAPY ALERT CARD  at check-in to the Emergency Department and triage nurse.  Should you have questions after your visit or need to cancel or reschedule your appointment, please contact [facility]  Dept: [phone]  and follow the prompts.  Office hours are 8:00 a.m. to 4:30 p.m. Monday - Friday. Please note that voicemails left after 4:00 p.m. may not be returned until the following business day.  We are closed weekends and major holidays. You have access to a nurse at all times for urgent questions. Please call the main number to the clinic Dept: [phone] and follow the prompts.   For any non-urgent questions, you may also contact your provider using MyChart. We now offer e-Visits for anyone 47 and older to request care online for non-urgent symptoms. For details visit [email or web address].   Also download the MyChart app! Go to the app store, search "MyChart", open the app, select [facility], and log in with your MyChart username and password.  Due to Covid, a mask is required upon entering the hospital/clinic. If you do not have a mask, one will be given to you upon arrival. For doctor visits, patients may have 1 support person aged 86 or older with them. For treatment visits, patients cannot have anyone with them due to current Covid guidelines and our immunocompromised population.   Irinotecan injection What is this medication? IRINOTECAN (ir in oh TEE kan ) is a chemotherapy drug. It is used to treat colon and rectal cancer. This medicine may be used for other purposes; ask your health care provider or pharmacist if you have questions. COMMON  BRAND NAME(S): Camptosar What should I tell my care team before I take this medication? They need to know if you have any of these conditions: dehydration diarrhea infection (especially a virus infection such as chickenpox, cold sores, or herpes) liver disease low blood counts, like low white cell, platelet, or red cell  counts low levels of calcium, magnesium, or potassium in the blood recent or ongoing radiation therapy an unusual or allergic reaction to irinotecan, other medicines, foods, dyes, or preservatives pregnant or trying to get pregnant breast-feeding How should I use this medication? This drug is given as an infusion into a vein. It is administered in a hospital or clinic by a specially trained health care professional. Talk to your pediatrician regarding the use of this medicine in children. Special care may be needed. Overdosage: If you think you have taken too much of this medicine contact a poison control center or emergency room at once. NOTE: This medicine is only for you. Do not share this medicine with others. What if I miss a dose? It is important not to miss your dose. Call your doctor or health care professional if you are unable to keep an appointment. What may interact with this medication? Do not take this medicine with any of the following medications: cobicistat itraconazole This medicine may interact with the following medications: antiviral medicines for HIV or AIDS certain antibiotics like rifampin or rifabutin certain medicines for fungal infections like ketoconazole, posaconazole, and voriconazole certain medicines for seizures like carbamazepine, phenobarbital, phenotoin clarithromycin gemfibrozil nefazodone [facility] John's Wort This list may not describe all possible interactions. Give your health care provider a list of all the medicines, herbs, non-prescription drugs, or dietary supplements you use. Also tell them if you smoke, drink alcohol, or use illegal drugs. Some items may interact with your medicine. What should I watch for while using this medication? Your condition will be monitored carefully while you are receiving this medicine. You will need important blood work done while you are taking this medicine. This drug may make you feel generally unwell. This is not  uncommon, as chemotherapy can affect healthy cells as well as cancer cells. Report any side effects. Continue your course of treatment even though you feel ill unless your doctor tells you to stop. In some cases, you may be given additional medicines to help with side effects. Follow all directions for their use. You may get drowsy or dizzy. Do not drive, use machinery, or do anything that needs mental alertness until you know how this medicine affects you. Do not stand or sit up quickly, especially if you are an older patient. This reduces the risk of dizzy or fainting spells. Call your health care professional for advice if you get a fever, chills, or sore throat, or other symptoms of a cold or flu. Do not treat yourself. This medicine decreases your body's ability to fight infections. Try to avoid being around people who are sick. Avoid taking products that contain aspirin, acetaminophen, ibuprofen, naproxen, or ketoprofen unless instructed by your doctor. These medicines may hide a fever. This medicine may increase your risk to bruise or bleed. Call your doctor or health care professional if you notice any unusual bleeding. Be careful brushing and flossing your teeth or using a toothpick because you may get an infection or bleed more easily. If you have any dental work done, tell your dentist you are receiving this medicine. Do not become pregnant while taking this medicine or for 6 months after  stopping it. Women should inform their health care professional if they wish to become pregnant or think they might be pregnant. Men should not father a child while taking this medicine and for 3 months after stopping it. There is potential for serious side effects to an unborn child. Talk to your health care professional for more information. Do not breast-feed an infant while taking this medicine or for 7 days after stopping it. This medicine has caused ovarian failure in some women. This medicine may make it  more difficult to get pregnant. Talk to your health care professional if you are concerned about your fertility. This medicine has caused decreased sperm counts in some men. This may make it more difficult to father a child. Talk to your health care professional if you are concerned about your fertility. What side effects may I notice from receiving this medication? Side effects that you should report to your doctor or health care professional as soon as possible: allergic reactions like skin rash, itching or hives, swelling of the face, lips, or tongue chest pain diarrhea flushing, runny nose, sweating during infusion low blood counts - this medicine may decrease the number of white blood cells, red blood cells and platelets. You may be at increased risk for infections and bleeding. nausea, vomiting pain, swelling, warmth in the leg signs of decreased platelets or bleeding - bruising, pinpoint red spots on the skin, black, tarry stools, blood in the urine signs of infection - fever or chills, cough, sore throat, pain or difficulty passing urine signs of decreased red blood cells - unusually weak or tired, fainting spells, lightheadedness Side effects that usually do not require medical attention (report to your doctor or health care professional if they continue or are bothersome): constipation hair loss headache loss of appetite mouth sores stomach pain This list may not describe all possible side effects. Call your doctor for medical advice about side effects. You may report side effects to FDA at 1-800-FDA-1088. Where should I keep my medication? This drug is given in a hospital or clinic and will not be stored at home. NOTE: This sheet is a summary. It may not cover all possible information. If you have questions about this medicine, talk to your doctor, pharmacist, or health care provider.  2022 Elsevier/Gold Standard (2019-01-17 17:46:13)  Leucovorin injection What is this  medication? LEUCOVORIN (loo koe VOR in) is used to prevent or treat the harmful effects of some medicines. This medicine is used to treat anemia caused by a low amount of folic acid in the body. It is also used with 5-fluorouracil (5-FU) to treat colon cancer. This medicine may be used for other purposes; ask your health care provider or pharmacist if you have questions. What should I tell my care team before I take this medication? They need to know if you have any of these conditions: anemia from low levels of vitamin B-12 in the blood an unusual or allergic reaction to leucovorin, folic acid, other medicines, foods, dyes, or preservatives pregnant or trying to get pregnant breast-feeding How should I use this medication? This medicine is for injection into a muscle or into a vein. It is given by a health care professional in a hospital or clinic setting. Talk to your pediatrician regarding the use of this medicine in children. Special care may be needed. Overdosage: If you think you have taken too much of this medicine contact a poison control center or emergency room at once. NOTE: This medicine is only  for you. Do not share this medicine with others. What if I miss a dose? This does not apply. What may interact with this medication? capecitabine fluorouracil phenobarbital phenytoin primidone trimethoprim-sulfamethoxazole This list may not describe all possible interactions. Give your health care provider a list of all the medicines, herbs, non-prescription drugs, or dietary supplements you use. Also tell them if you smoke, drink alcohol, or use illegal drugs. Some items may interact with your medicine. What should I watch for while using this medication? Your condition will be monitored carefully while you are receiving this medicine. This medicine may increase the side effects of 5-fluorouracil, 5-FU. Tell your doctor or health care professional if you have diarrhea or mouth sores  that do not get better or that get worse. What side effects may I notice from receiving this medication? Side effects that you should report to your doctor or health care professional as soon as possible: allergic reactions like skin rash, itching or hives, swelling of the face, lips, or tongue breathing problems fever, infection mouth sores unusual bleeding or bruising unusually weak or tired Side effects that usually do not require medical attention (report to your doctor or health care professional if they continue or are bothersome): constipation or diarrhea loss of appetite nausea, vomiting This list may not describe all possible side effects. Call your doctor for medical advice about side effects. You may report side effects to FDA at 1-800-FDA-1088. Where should I keep my medication? This drug is given in a hospital or clinic and will not be stored at home. NOTE: This sheet is a summary. It may not cover all possible information. If you have questions about this medicine, talk to your doctor, pharmacist, or health care provider.  2022 Elsevier/Gold Standard (2007-08-23 16:50:29)  Fluorouracil, 5-FU injection What is this medication? FLUOROURACIL, 5-FU (flure oh YOOR a sil) is a chemotherapy drug. It slows the growth of cancer cells. This medicine is used to treat many types of cancer like breast cancer, colon or rectal cancer, pancreatic cancer, and stomach cancer. This medicine may be used for other purposes; ask your health care provider or pharmacist if you have questions. COMMON BRAND NAME(S): Adrucil What should I tell my care team before I take this medication? They need to know if you have any of these conditions: blood disorders dihydropyrimidine dehydrogenase (DPD) deficiency infection (especially a virus infection such as chickenpox, cold sores, or herpes) kidney disease liver disease malnourished, poor nutrition recent or ongoing radiation therapy an unusual or  allergic reaction to fluorouracil, other chemotherapy, other medicines, foods, dyes, or preservatives pregnant or trying to get pregnant breast-feeding How should I use this medication? This drug is given as an infusion or injection into a vein. It is administered in a hospital or clinic by a specially trained health care professional. Talk to your pediatrician regarding the use of this medicine in children. Special care may be needed. Overdosage: If you think you have taken too much of this medicine contact a poison control center or emergency room at once. NOTE: This medicine is only for you. Do not share this medicine with others. What if I miss a dose? It is important not to miss your dose. Call your doctor or health care professional if you are unable to keep an appointment. What may interact with this medication? Do not take this medicine with any of the following medications: live virus vaccines This medicine may also interact with the following medications: medicines that treat or prevent blood  clots like warfarin, enoxaparin, and dalteparin This list may not describe all possible interactions. Give your health care provider a list of all the medicines, herbs, non-prescription drugs, or dietary supplements you use. Also tell them if you smoke, drink alcohol, or use illegal drugs. Some items may interact with your medicine. What should I watch for while using this medication? Visit your doctor for checks on your progress. This drug may make you feel generally unwell. This is not uncommon, as chemotherapy can affect healthy cells as well as cancer cells. Report any side effects. Continue your course of treatment even though you feel ill unless your doctor tells you to stop. In some cases, you may be given additional medicines to help with side effects. Follow all directions for their use. Call your doctor or health care professional for advice if you get a fever, chills or sore throat, or  other symptoms of a cold or flu. Do not treat yourself. This drug decreases your body's ability to fight infections. Try to avoid being around people who are sick. This medicine may increase your risk to bruise or bleed. Call your doctor or health care professional if you notice any unusual bleeding. Be careful brushing and flossing your teeth or using a toothpick because you may get an infection or bleed more easily. If you have any dental work done, tell your dentist you are receiving this medicine. Avoid taking products that contain aspirin, acetaminophen, ibuprofen, naproxen, or ketoprofen unless instructed by your doctor. These medicines may hide a fever. Do not become pregnant while taking this medicine. Women should inform their doctor if they wish to become pregnant or think they might be pregnant. There is a potential for serious side effects to an unborn child. Talk to your health care professional or pharmacist for more information. Do not breast-feed an infant while taking this medicine. Men should inform their doctor if they wish to father a child. This medicine may lower sperm counts. Do not treat diarrhea with over the counter products. Contact your doctor if you have diarrhea that lasts more than 2 days or if it is severe and watery. This medicine can make you more sensitive to the sun. Keep out of the sun. If you cannot avoid being in the sun, wear protective clothing and use sunscreen. Do not use sun lamps or tanning beds/booths. What side effects may I notice from receiving this medication? Side effects that you should report to your doctor or health care professional as soon as possible: allergic reactions like skin rash, itching or hives, swelling of the face, lips, or tongue low blood counts - this medicine may decrease the number of white blood cells, red blood cells and platelets. You may be at increased risk for infections and bleeding. signs of infection - fever or chills,  cough, sore throat, pain or difficulty passing urine signs of decreased platelets or bleeding - bruising, pinpoint red spots on the skin, black, tarry stools, blood in the urine signs of decreased red blood cells - unusually weak or tired, fainting spells, lightheadedness breathing problems changes in vision chest pain mouth sores nausea and vomiting pain, swelling, redness at site where injected pain, tingling, numbness in the hands or feet redness, swelling, or sores on hands or feet stomach pain unusual bleeding Side effects that usually do not require medical attention (report to your doctor or health care professional if they continue or are bothersome): changes in finger or toe nails diarrhea dry or itchy skin hair  loss headache loss of appetite sensitivity of eyes to the light stomach upset unusually teary eyes This list may not describe all possible side effects. Call your doctor for medical advice about side effects. You may report side effects to FDA at 1-800-FDA-1088. Where should I keep my medication? This drug is given in a hospital or clinic and will not be stored at home. NOTE: This sheet is a summary. It may not cover all possible information. If you have questions about this medicine, talk to your doctor, pharmacist, or health care provider.  2022 Elsevier/Gold Standard (2019-01-17 15:00:03)

## 2020-11-11 NOTE — Progress Notes (Signed)
  [facility] OFFICE PROGRESS NOTE   Diagnosis: Pancreas cancer  INTERVAL HISTORY:   Kirsten [name] completed another cycle of FOLFIRI on 10/28/2020.  No nausea/vomiting, mouth sores, or diarrhea.  She continues to have numbness in the fingers.  This does not interfere with activity.  No abdominal pain.  No new complaint.  Objective:  Vital signs in last 24 hours:  Blood pressure (!) 147/91, pulse 97, temperature 97.8 F (36.6 C), temperature source Oral, resp. rate 20, height '5\' 3"'$  (1.6 m), weight 128 lb 9.6 oz (58.3 kg), SpO2 100 %.    HEENT: No thrush or ulcers Resp: Lungs clear bilaterally Cardio: Regular rate and rhythm GI: No hepatosplenomegaly, mild tenderness in the right subcostal region, no mass Vascular: No leg edema  Skin: Mild hyperpigmentation of the hands  Portacath/PICC-without erythema  Lab Results:  Lab Results  Component Value Date   WBC 7.6 11/11/2020   HGB 11.4 (L) 11/11/2020   HCT 36.6 11/11/2020   MCV 94.6 11/11/2020   PLT 258 11/11/2020   NEUTROABS 3.6 11/11/2020    CMP  Lab Results  Component Value Date   NA 140 10/28/2020   K 3.8 10/28/2020   CL 104 10/28/2020   CO2 28 10/28/2020   GLUCOSE 119 (H) 10/28/2020   BUN 7 (L) 10/28/2020   CREATININE 0.88 10/28/2020   CALCIUM 9.6 10/28/2020   PROT 7.0 10/28/2020   ALBUMIN 3.3 (L) 10/28/2020   AST 56 (H) 10/28/2020   ALT 49 (H) 10/28/2020   ALKPHOS 321 (H) 10/28/2020   BILITOT 0.5 10/28/2020   GFRNONAA >60 10/28/2020    Lab Results  Component Value Date   [id number] 22,088 (H) 10/14/2020     Medications: I have reviewed the patient's current medications.   Assessment/Plan: Metastatic pancreas cancer Right upper quadrant ultrasound 06/17/2020-multiple hypoechoic liver lesions CT abdomen/pelvis 06/17/2020- pancreas tail mass, multiple liver metastases, enlarged periaortic nodes, right ovary metastasis?,  Nodularity at the left paracolic gutter and posterior/inferior stomach  suggesting peritoneal tumor spread CT chest 06/17/2020- scattered small pulmonary nodules consistent with metastatic disease Ultrasound-guided biopsy of a left liver mass 06/18/2020- adenocarcinoma, morphology compatible with metastatic pancreatic adenocarcinoma Cycle 1 FOLFOX 07/01/2020 Cycle 2 FOLFOX 07/16/2020 Cycle 3 FOLFOX 07/31/2020 Cycle 4 FOLFIRINOX 08/13/2020 Cycle 5 FOLFIRINOX 09/03/2020, Udenyca CTs 09/13/2020-unchanged nodule medial left lower lobe; stable nodal and likely peritoneal disease in the abdomen/pelvis; unchanged pancreatic tail mass; multifocal hepatic metastases, mildly progressive Cycle 6 FOLFIRINOX 09/16/2020, Oxaliplatin held Cycle 7 FOLFIRINOX 09/30/2020 Cycle 8 FOLFIRINOX 10/14/2020, oxaliplatin held Cycle 9 FOLFIRINOX 10/28/2020, oxaliplatin held Cycle 10 FOLFIRINOX 11/11/2020, oxaliplatin held 2.  Pain secondary to #1 3.  Weight loss 4.  Family history of pancreas cancer 5.  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 Sorn appears stable.  She will complete another cycle of chemotherapy today.  Oxaliplatin will remain on hold due to neuropathy symptoms.  We will consider adding oxaliplatin for a few more cycles if the neuropathy symptoms improved.  She will return for an office visit and chemotherapy in 2 weeks.  We will follow-up on the CA 19-9 from today.  [name], MD  11/11/2020  9:04 AM

## 2020-11-11 NOTE — Patient Instructions (Signed)
Implanted Port Home Guide An implanted port is a device that is placed under the skin. It is usually placed in the chest. The device can be used to give IV medicine, to take blood, or for dialysis. You may have an implanted port if: You need IV medicine that would be irritating to the small veins in your hands or arms. You need IV medicines, such as antibiotics, for a long period of time. You need IV nutrition for a long period of time. You need dialysis. When you have a port, your health care provider can choose to use the port instead of veins in your arms for these procedures. You may have fewer limitations when using a port than you would if you used other types of long-term IVs, and you will likely be able to return to normal activities after your incision heals. An implanted port has two main parts: Reservoir. The reservoir is the part where a needle is inserted to give medicines or draw blood. The reservoir is round. After it is placed, it appears as a small, raised area under your skin. Catheter. The catheter is a thin, flexible tube that connects the reservoir to a vein. Medicine that is inserted into the reservoir goes into the catheter and then into the vein. How is my port accessed? To access your port: A numbing cream may be placed on the skin over the port site. Your health care provider will put on a mask and sterile gloves. The skin over your port will be cleaned carefully with a germ-killing soap and allowed to dry. Your health care provider will gently pinch the port and insert a needle into it. Your health care provider will check for a blood return to make sure the port is in the vein and is not clogged. If your port needs to remain accessed to get medicine continuously (constant infusion), your health care provider will place a clear bandage (dressing) over the needle site. The dressing and needle will need to be changed every week, or as told by your health care provider. What  is flushing? Flushing helps keep the port from getting clogged. Follow instructions from your health care provider about how and when to flush the port. Ports are usually flushed with saline solution or a medicine called heparin. The need for flushing will depend on how the port is used: If the port is only used from time to time to give medicines or draw blood, the port may need to be flushed: Before and after medicines have been given. Before and after blood has been drawn. As part of routine maintenance. Flushing may be recommended every 4-6 weeks. If a constant infusion is running, the port may not need to be flushed. Throw away any syringes in a disposal container that is meant for sharp items (sharps container). You can buy a sharps container from a pharmacy, or you can make one by using an empty hard plastic bottle with a cover. How long will my port stay implanted? The port can stay in for as long as your health care provider thinks it is needed. When it is time for the port to come out, a surgery will be done to remove it. The surgery will be similar to the procedure that was done to put the port in. Follow these instructions at home:  Flush your port as told by your health care provider. If you need an infusion over several days, follow instructions from your health care provider about how   to take care of your port site. Make sure you: Wash your hands with soap and water before you change your dressing. If soap and water are not available, use alcohol-based hand sanitizer. Change your dressing as told by your health care provider. Place any used dressings or infusion bags into a plastic bag. Throw that bag in the trash. Keep the dressing that covers the needle clean and dry. Do not get it wet. Do not use scissors or sharp objects near the tube. Keep the tube clamped, unless it is being used. Check your port site every day for signs of infection. Check for: Redness, swelling, or  pain. Fluid or blood. Pus or a bad smell. Protect the skin around the port site. Avoid wearing bra straps that rub or irritate the site. Protect the skin around your port from seat belts. Place a soft pad over your chest if needed. Bathe or shower as told by your health care provider. The site may get wet as long as you are not actively receiving an infusion. Return to your normal activities as told by your health care provider. Ask your health care provider what activities are safe for you. Carry a medical alert card or wear a medical alert bracelet at all times. This will let health care providers know that you have an implanted port in case of an emergency. Get help right away if: You have redness, swelling, or pain at the port site. You have fluid or blood coming from your port site. You have pus or a bad smell coming from the port site. You have a fever. Summary Implanted ports are usually placed in the chest for long-term IV access. Follow instructions from your health care provider about flushing the port and changing bandages (dressings). Take care of the area around your port by avoiding clothing that puts pressure on the area, and by watching for signs of infection. Protect the skin around your port from seat belts. Place a soft pad over your chest if needed. Get help right away if you have a fever or you have redness, swelling, pain, drainage, or a bad smell at the port site. This information is not intended to replace advice given to you by your health care provider. Make sure you discuss any questions you have with your health care provider. Document Revised: 05/08/2020 Document Reviewed: 07/03/2019 [employer] Patient Education  2022 [employer]  

## 2020-11-11 NOTE — Progress Notes (Signed)
Patient presents for treatment. RN assessment completed along with the following:  Treatment Conditions (labs/vitals/weight) reviewed - Yes, and within treatment parameters.   Oncology Treatment Attestation completed for current therapy- Yes, on date 06/27/20 Informed consent completed and reflects current therapy/intent - Yes, on date 07/01/20 Provider progress note reviewed - Yes, today's provider note was reviewed. Treatment/Antibody/Supportive plan reviewed - Blank single:[id number]::"Yes, , and there are no adjustments needed for today's treatment."} S&H and other orders reviewed - Yes, and there are no additional orders identified. Previous treatment date reviewed - Yes, and the appropriate amount of time has elapsed between treatments.  Patient to proceed with treatment.

## 2020-11-12 LAB — CANCER ANTIGEN 19-9: CA 19-9: 11754 U/mL — ABNORMAL HIGH (ref 0–35)

## 2020-11-13 ENCOUNTER — Other Ambulatory Visit: Payer: Self-pay

## 2020-11-13 ENCOUNTER — Inpatient Hospital Stay: Payer: Medicaid Other

## 2020-11-13 DIAGNOSIS — Z5111 Encounter for antineoplastic chemotherapy: Secondary | ICD-10-CM | POA: Diagnosis not present

## 2020-11-13 DIAGNOSIS — C252 Malignant neoplasm of tail of pancreas: Secondary | ICD-10-CM

## 2020-11-13 MED ORDER — HEPARIN SOD (PORK) LOCK FLUSH 100 UNIT/ML IV SOLN
500.0000 [IU] | Freq: Once | INTRAVENOUS | Status: AC | PRN
  Administered 2020-11-13: 500 [IU]

## 2020-11-13 MED ORDER — SODIUM CHLORIDE 0.9% FLUSH
10.0000 mL | INTRAVENOUS | Status: DC | PRN
  Administered 2020-11-13: 10 mL

## 2020-11-13 MED ORDER — PEGFILGRASTIM-BMEZ 6 MG/0.6ML ~~LOC~~ SOSY
6.0000 mg | PREFILLED_SYRINGE | Freq: Once | SUBCUTANEOUS | Status: AC
  Administered 2020-11-13: 6 mg via SUBCUTANEOUS

## 2020-11-13 NOTE — Patient Instructions (Signed)

## 2020-11-24 ENCOUNTER — Other Ambulatory Visit: Payer: Self-pay | Admitting: Oncology

## 2020-11-25 ENCOUNTER — Other Ambulatory Visit: Payer: Self-pay

## 2020-11-25 ENCOUNTER — Inpatient Hospital Stay (HOSPITAL_BASED_OUTPATIENT_CLINIC_OR_DEPARTMENT_OTHER): Payer: Medicaid Other | Admitting: Nurse Practitioner

## 2020-11-25 ENCOUNTER — Inpatient Hospital Stay: Payer: Medicaid Other

## 2020-11-25 ENCOUNTER — Encounter: Payer: Self-pay | Admitting: Nurse Practitioner

## 2020-11-25 VITALS — BP 144/85 | HR 89 | Temp 97.5°F | Resp 18 | Wt 131.6 lb

## 2020-11-25 DIAGNOSIS — Z5111 Encounter for antineoplastic chemotherapy: Secondary | ICD-10-CM | POA: Diagnosis not present

## 2020-11-25 DIAGNOSIS — C252 Malignant neoplasm of tail of pancreas: Secondary | ICD-10-CM | POA: Diagnosis not present

## 2020-11-25 LAB — CMP (CANCER CENTER ONLY)
ALT: 30 U/L (ref 0–44)
AST: 41 U/L (ref 15–41)
Albumin: 3.7 g/dL (ref 3.5–5.0)
Alkaline Phosphatase: 229 U/L — ABNORMAL HIGH (ref 38–126)
Anion gap: 7 (ref 5–15)
BUN: 7 mg/dL — ABNORMAL LOW (ref 8–23)
CO2: 28 mmol/L (ref 22–32)
Calcium: 10 mg/dL (ref 8.9–10.3)
Chloride: 105 mmol/L (ref 98–111)
Creatinine: 0.74 mg/dL (ref 0.44–1.00)
GFR, Estimated: >60 mL/min (ref >60)
Glucose, Bld: 157 mg/dL — ABNORMAL HIGH (ref 70–99)
Potassium: 3.8 mmol/L (ref 3.5–5.1)
Sodium: 140 mmol/L (ref 135–145)
Total Bilirubin: 0.4 mg/dL (ref 0.3–1.2)
Total Protein: 6.6 g/dL (ref 6.5–8.1)

## 2020-11-25 LAB — CBC WITH DIFFERENTIAL (CANCER CENTER ONLY)
Abs Immature Granulocytes: 0.03 10*3/uL (ref 0.00–0.07)
Basophils Absolute: 0.1 10*3/uL (ref 0.0–0.1)
Basophils Relative: 1 %
Eosinophils Absolute: 0.3 10*3/uL (ref 0.0–0.5)
Eosinophils Relative: 5 %
HCT: 35.6 % — ABNORMAL LOW (ref 36.0–46.0)
Hemoglobin: 11.1 g/dL — ABNORMAL LOW (ref 12.0–15.0)
Immature Granulocytes: 1 %
Lymphocytes Relative: 36 %
Lymphs Abs: 2.2 10*3/uL (ref 0.7–4.0)
MCH: 29.9 pg (ref 26.0–34.0)
MCHC: 31.2 g/dL (ref 30.0–36.0)
MCV: 96 fL (ref 80.0–100.0)
Monocytes Absolute: 0.9 10*3/uL (ref 0.1–1.0)
Monocytes Relative: 15 %
Neutro Abs: 2.6 10*3/uL (ref 1.7–7.7)
Neutrophils Relative %: 42 %
Platelet Count: 272 10*3/uL (ref 150–400)
RBC: 3.71 MIL/uL — ABNORMAL LOW (ref 3.87–5.11)
RDW: 16.6 % — ABNORMAL HIGH (ref 11.5–15.5)
WBC Count: 6.2 10*3/uL (ref 4.0–10.5)
nRBC: 0 % (ref 0.0–0.2)

## 2020-11-25 MED ORDER — SODIUM CHLORIDE 0.9 % IV SOLN
125.0000 mg/m2 | Freq: Once | INTRAVENOUS | Status: AC
  Administered 2020-11-25: 200 mg via INTRAVENOUS
  Filled 2020-11-25: qty 10

## 2020-11-25 MED ORDER — ATROPINE SULFATE 1 MG/ML IJ SOLN
0.5000 mg | Freq: Once | INTRAMUSCULAR | Status: AC | PRN
  Administered 2020-11-25: 0.5 mg via INTRAVENOUS
  Filled 2020-11-25: qty 1

## 2020-11-25 MED ORDER — SODIUM CHLORIDE 0.9 % IV SOLN
400.0000 mg/m2 | Freq: Once | INTRAVENOUS | Status: AC
  Administered 2020-11-25: 644 mg via INTRAVENOUS
  Filled 2020-11-25: qty 32.2

## 2020-11-25 MED ORDER — SODIUM CHLORIDE 0.9% FLUSH
10.0000 mL | INTRAVENOUS | Status: DC | PRN
  Administered 2020-11-25: 10 mL

## 2020-11-25 MED ORDER — SODIUM CHLORIDE 0.9 % IV SOLN
10.0000 mg | Freq: Once | INTRAVENOUS | Status: AC
  Administered 2020-11-25: 10 mg via INTRAVENOUS
  Filled 2020-11-25: qty 1

## 2020-11-25 MED ORDER — SODIUM CHLORIDE 0.9 % IV SOLN
150.0000 mg | Freq: Once | INTRAVENOUS | Status: AC
  Administered 2020-11-25: 150 mg via INTRAVENOUS
  Filled 2020-11-25: qty 5

## 2020-11-25 MED ORDER — PALONOSETRON HCL INJECTION 0.25 MG/5ML
0.2500 mg | Freq: Once | INTRAVENOUS | Status: AC
  Administered 2020-11-25: 0.25 mg via INTRAVENOUS
  Filled 2020-11-25: qty 5

## 2020-11-25 MED ORDER — SODIUM CHLORIDE 0.9 % IV SOLN
2400.0000 mg/m2 | INTRAVENOUS | Status: DC
  Administered 2020-11-25: 3850 mg via INTRAVENOUS
  Filled 2020-11-25: qty 77

## 2020-11-25 MED ORDER — SODIUM CHLORIDE 0.9 % IV SOLN: Freq: Once | INTRAVENOUS | Status: AC

## 2020-11-25 NOTE — Patient Instructions (Signed)
Implanted Port Home Guide An implanted port is a device that is placed under the skin. It is usually placed in the chest. The device can be used to give IV medicine, to take blood, or for dialysis. You may have an implanted port if: You need IV medicine that would be irritating to the small veins in your hands or arms. You need IV medicines, such as antibiotics, for a long period of time. You need IV nutrition for a long period of time. You need dialysis. When you have a port, your health care provider can choose to use the port instead of veins in your arms for these procedures. You may have fewer limitations when using a port than you would if you used other types of long-term IVs, and you will likely be able to return to normal activities after your incision heals. An implanted port has two main parts: Reservoir. The reservoir is the part where a needle is inserted to give medicines or draw blood. The reservoir is round. After it is placed, it appears as a small, raised area under your skin. Catheter. The catheter is a thin, flexible tube that connects the reservoir to a vein. Medicine that is inserted into the reservoir goes into the catheter and then into the vein. How is my port accessed? To access your port: A numbing cream may be placed on the skin over the port site. Your health care provider will put on a mask and sterile gloves. The skin over your port will be cleaned carefully with a germ-killing soap and allowed to dry. Your health care provider will gently pinch the port and insert a needle into it. Your health care provider will check for a blood return to make sure the port is in the vein and is not clogged. If your port needs to remain accessed to get medicine continuously (constant infusion), your health care provider will place a clear bandage (dressing) over the needle site. The dressing and needle will need to be changed every week, or as told by your health care provider. What  is flushing? Flushing helps keep the port from getting clogged. Follow instructions from your health care provider about how and when to flush the port. Ports are usually flushed with saline solution or a medicine called heparin. The need for flushing will depend on how the port is used: If the port is only used from time to time to give medicines or draw blood, the port may need to be flushed: Before and after medicines have been given. Before and after blood has been drawn. As part of routine maintenance. Flushing may be recommended every 4-6 weeks. If a constant infusion is running, the port may not need to be flushed. Throw away any syringes in a disposal container that is meant for sharp items (sharps container). You can buy a sharps container from a pharmacy, or you can make one by using an empty hard plastic bottle with a cover. How long will my port stay implanted? The port can stay in for as long as your health care provider thinks it is needed. When it is time for the port to come out, a surgery will be done to remove it. The surgery will be similar to the procedure that was done to put the port in. Follow these instructions at home:  Flush your port as told by your health care provider. If you need an infusion over several days, follow instructions from your health care provider about how   to take care of your port site. Make sure you: Wash your hands with soap and water before you change your dressing. If soap and water are not available, use alcohol-based hand sanitizer. Change your dressing as told by your health care provider. Place any used dressings or infusion bags into a plastic bag. Throw that bag in the trash. Keep the dressing that covers the needle clean and dry. Do not get it wet. Do not use scissors or sharp objects near the tube. Keep the tube clamped, unless it is being used. Check your port site every day for signs of infection. Check for: Redness, swelling, or  pain. Fluid or blood. Pus or a bad smell. Protect the skin around the port site. Avoid wearing bra straps that rub or irritate the site. Protect the skin around your port from seat belts. Place a soft pad over your chest if needed. Bathe or shower as told by your health care provider. The site may get wet as long as you are not actively receiving an infusion. Return to your normal activities as told by your health care provider. Ask your health care provider what activities are safe for you. Carry a medical alert card or wear a medical alert bracelet at all times. This will let health care providers know that you have an implanted port in case of an emergency. Get help right away if: You have redness, swelling, or pain at the port site. You have fluid or blood coming from your port site. You have pus or a bad smell coming from the port site. You have a fever. Summary Implanted ports are usually placed in the chest for long-term IV access. Follow instructions from your health care provider about flushing the port and changing bandages (dressings). Take care of the area around your port by avoiding clothing that puts pressure on the area, and by watching for signs of infection. Protect the skin around your port from seat belts. Place a soft pad over your chest if needed. Get help right away if you have a fever or you have redness, swelling, pain, drainage, or a bad smell at the port site. This information is not intended to replace advice given to you by your health care provider. Make sure you discuss any questions you have with your health care provider. Document Revised: 05/08/2020 Document Reviewed: 07/03/2019 [employer] Patient Education  2022 [employer]  

## 2020-11-25 NOTE — Patient Instructions (Signed)
[facility]  Discharge Instructions: Thank you for choosing [facility] to provide your oncology and hematology care.   If you have a lab appointment with the [facility], please go directly to the [facility] and check in at the registration area.   Wear comfortable clothing and clothing appropriate for easy access to any Portacath or PICC line.   We strive to give you quality time with your provider. You may need to reschedule your appointment if you arrive late (15 or more minutes).  Arriving late affects you and other patients whose appointments are after yours.  Also, if you miss three or more appointments without notifying the office, you may be dismissed from the clinic at the provider's discretion.      For prescription refill requests, have your pharmacy contact our office and allow 72 hours for refills to be completed.    Today you received the following chemotherapy and/or immunotherapy agents irinotecan, leucovorin, 61fu       To help prevent nausea and vomiting after your treatment, we encourage you to take your nausea medication as directed.  BELOW ARE SYMPTOMS THAT SHOULD BE REPORTED IMMEDIATELY: *FEVER GREATER THAN 100.4 F (38 C) OR HIGHER *CHILLS OR SWEATING *NAUSEA AND VOMITING THAT IS NOT CONTROLLED WITH YOUR NAUSEA MEDICATION *UNUSUAL SHORTNESS OF BREATH *UNUSUAL BRUISING OR BLEEDING *URINARY PROBLEMS (pain or burning when urinating, or frequent urination) *BOWEL PROBLEMS (unusual diarrhea, constipation, pain near the anus) TENDERNESS IN MOUTH AND THROAT WITH OR WITHOUT PRESENCE OF ULCERS (sore throat, sores in mouth, or a toothache) UNUSUAL RASH, SWELLING OR PAIN  UNUSUAL VAGINAL DISCHARGE OR ITCHING   Items with * indicate a potential emergency and should be followed up as soon as possible or go to the Emergency Department if any problems should occur.  Please show the CHEMOTHERAPY ALERT CARD or IMMUNOTHERAPY ALERT CARD  at check-in to the Emergency Department and triage nurse.  Should you have questions after your visit or need to cancel or reschedule your appointment, please contact [facility]  Dept: [phone]  and follow the prompts.  Office hours are 8:00 a.m. to 4:30 p.m. Monday - Friday. Please note that voicemails left after 4:00 p.m. may not be returned until the following business day.  We are closed weekends and major holidays. You have access to a nurse at all times for urgent questions. Please call the main number to the clinic Dept: [phone] and follow the prompts.   For any non-urgent questions, you may also contact your provider using MyChart. We now offer e-Visits for anyone 47 and older to request care online for non-urgent symptoms. For details visit [email or web address].   Also download the MyChart app! Go to the app store, search "MyChart", open the app, select [facility], and log in with your MyChart username and password.  Due to Covid, a mask is required upon entering the hospital/clinic. If you do not have a mask, one will be given to you upon arrival. For doctor visits, patients may have 1 support person aged 86 or older with them. For treatment visits, patients cannot have anyone with them due to current Covid guidelines and our immunocompromised population.   Irinotecan injection What is this medication? IRINOTECAN (ir in oh TEE kan ) is a chemotherapy drug. It is used to treat colon and rectal cancer. This medicine may be used for other purposes; ask your health care provider or pharmacist if you have questions. COMMON  BRAND NAME(S): Camptosar What should I tell my care team before I take this medication? They need to know if you have any of these conditions: dehydration diarrhea infection (especially a virus infection such as chickenpox, cold sores, or herpes) liver disease low blood counts, like low white cell, platelet, or red cell  counts low levels of calcium, magnesium, or potassium in the blood recent or ongoing radiation therapy an unusual or allergic reaction to irinotecan, other medicines, foods, dyes, or preservatives pregnant or trying to get pregnant breast-feeding How should I use this medication? This drug is given as an infusion into a vein. It is administered in a hospital or clinic by a specially trained health care professional. Talk to your pediatrician regarding the use of this medicine in children. Special care may be needed. Overdosage: If you think you have taken too much of this medicine contact a poison control center or emergency room at once. NOTE: This medicine is only for you. Do not share this medicine with others. What if I miss a dose? It is important not to miss your dose. Call your doctor or health care professional if you are unable to keep an appointment. What may interact with this medication? Do not take this medicine with any of the following medications: cobicistat itraconazole This medicine may interact with the following medications: antiviral medicines for HIV or AIDS certain antibiotics like rifampin or rifabutin certain medicines for fungal infections like ketoconazole, posaconazole, and voriconazole certain medicines for seizures like carbamazepine, phenobarbital, phenotoin clarithromycin gemfibrozil nefazodone [facility] John's Wort This list may not describe all possible interactions. Give your health care provider a list of all the medicines, herbs, non-prescription drugs, or dietary supplements you use. Also tell them if you smoke, drink alcohol, or use illegal drugs. Some items may interact with your medicine. What should I watch for while using this medication? Your condition will be monitored carefully while you are receiving this medicine. You will need important blood work done while you are taking this medicine. This drug may make you feel generally unwell. This is not  uncommon, as chemotherapy can affect healthy cells as well as cancer cells. Report any side effects. Continue your course of treatment even though you feel ill unless your doctor tells you to stop. In some cases, you may be given additional medicines to help with side effects. Follow all directions for their use. You may get drowsy or dizzy. Do not drive, use machinery, or do anything that needs mental alertness until you know how this medicine affects you. Do not stand or sit up quickly, especially if you are an older patient. This reduces the risk of dizzy or fainting spells. Call your health care professional for advice if you get a fever, chills, or sore throat, or other symptoms of a cold or flu. Do not treat yourself. This medicine decreases your body's ability to fight infections. Try to avoid being around people who are sick. Avoid taking products that contain aspirin, acetaminophen, ibuprofen, naproxen, or ketoprofen unless instructed by your doctor. These medicines may hide a fever. This medicine may increase your risk to bruise or bleed. Call your doctor or health care professional if you notice any unusual bleeding. Be careful brushing and flossing your teeth or using a toothpick because you may get an infection or bleed more easily. If you have any dental work done, tell your dentist you are receiving this medicine. Do not become pregnant while taking this medicine or for 6 months after  stopping it. Women should inform their health care professional if they wish to become pregnant or think they might be pregnant. Men should not father a child while taking this medicine and for 3 months after stopping it. There is potential for serious side effects to an unborn child. Talk to your health care professional for more information. Do not breast-feed an infant while taking this medicine or for 7 days after stopping it. This medicine has caused ovarian failure in some women. This medicine may make it  more difficult to get pregnant. Talk to your health care professional if you are concerned about your fertility. This medicine has caused decreased sperm counts in some men. This may make it more difficult to father a child. Talk to your health care professional if you are concerned about your fertility. What side effects may I notice from receiving this medication? Side effects that you should report to your doctor or health care professional as soon as possible: allergic reactions like skin rash, itching or hives, swelling of the face, lips, or tongue chest pain diarrhea flushing, runny nose, sweating during infusion low blood counts - this medicine may decrease the number of white blood cells, red blood cells and platelets. You may be at increased risk for infections and bleeding. nausea, vomiting pain, swelling, warmth in the leg signs of decreased platelets or bleeding - bruising, pinpoint red spots on the skin, black, tarry stools, blood in the urine signs of infection - fever or chills, cough, sore throat, pain or difficulty passing urine signs of decreased red blood cells - unusually weak or tired, fainting spells, lightheadedness Side effects that usually do not require medical attention (report to your doctor or health care professional if they continue or are bothersome): constipation hair loss headache loss of appetite mouth sores stomach pain This list may not describe all possible side effects. Call your doctor for medical advice about side effects. You may report side effects to FDA at 1-800-FDA-1088. Where should I keep my medication? This drug is given in a hospital or clinic and will not be stored at home. NOTE: This sheet is a summary. It may not cover all possible information. If you have questions about this medicine, talk to your doctor, pharmacist, or health care provider.  2022 Elsevier/Gold Standard (2019-01-17 17:46:13)  Leucovorin injection What is this  medication? LEUCOVORIN (loo koe VOR in) is used to prevent or treat the harmful effects of some medicines. This medicine is used to treat anemia caused by a low amount of folic acid in the body. It is also used with 5-fluorouracil (5-FU) to treat colon cancer. This medicine may be used for other purposes; ask your health care provider or pharmacist if you have questions. What should I tell my care team before I take this medication? They need to know if you have any of these conditions: anemia from low levels of vitamin B-12 in the blood an unusual or allergic reaction to leucovorin, folic acid, other medicines, foods, dyes, or preservatives pregnant or trying to get pregnant breast-feeding How should I use this medication? This medicine is for injection into a muscle or into a vein. It is given by a health care professional in a hospital or clinic setting. Talk to your pediatrician regarding the use of this medicine in children. Special care may be needed. Overdosage: If you think you have taken too much of this medicine contact a poison control center or emergency room at once. NOTE: This medicine is only  for you. Do not share this medicine with others. What if I miss a dose? This does not apply. What may interact with this medication? capecitabine fluorouracil phenobarbital phenytoin primidone trimethoprim-sulfamethoxazole This list may not describe all possible interactions. Give your health care provider a list of all the medicines, herbs, non-prescription drugs, or dietary supplements you use. Also tell them if you smoke, drink alcohol, or use illegal drugs. Some items may interact with your medicine. What should I watch for while using this medication? Your condition will be monitored carefully while you are receiving this medicine. This medicine may increase the side effects of 5-fluorouracil, 5-FU. Tell your doctor or health care professional if you have diarrhea or mouth sores  that do not get better or that get worse. What side effects may I notice from receiving this medication? Side effects that you should report to your doctor or health care professional as soon as possible: allergic reactions like skin rash, itching or hives, swelling of the face, lips, or tongue breathing problems fever, infection mouth sores unusual bleeding or bruising unusually weak or tired Side effects that usually do not require medical attention (report to your doctor or health care professional if they continue or are bothersome): constipation or diarrhea loss of appetite nausea, vomiting This list may not describe all possible side effects. Call your doctor for medical advice about side effects. You may report side effects to FDA at 1-800-FDA-1088. Where should I keep my medication? This drug is given in a hospital or clinic and will not be stored at home. NOTE: This sheet is a summary. It may not cover all possible information. If you have questions about this medicine, talk to your doctor, pharmacist, or health care provider.  2022 Elsevier/Gold Standard (2007-08-23 16:50:29)  Fluorouracil, 5-FU injection What is this medication? FLUOROURACIL, 5-FU (flure oh YOOR a sil) is a chemotherapy drug. It slows the growth of cancer cells. This medicine is used to treat many types of cancer like breast cancer, colon or rectal cancer, pancreatic cancer, and stomach cancer. This medicine may be used for other purposes; ask your health care provider or pharmacist if you have questions. COMMON BRAND NAME(S): Adrucil What should I tell my care team before I take this medication? They need to know if you have any of these conditions: blood disorders dihydropyrimidine dehydrogenase (DPD) deficiency infection (especially a virus infection such as chickenpox, cold sores, or herpes) kidney disease liver disease malnourished, poor nutrition recent or ongoing radiation therapy an unusual or  allergic reaction to fluorouracil, other chemotherapy, other medicines, foods, dyes, or preservatives pregnant or trying to get pregnant breast-feeding How should I use this medication? This drug is given as an infusion or injection into a vein. It is administered in a hospital or clinic by a specially trained health care professional. Talk to your pediatrician regarding the use of this medicine in children. Special care may be needed. Overdosage: If you think you have taken too much of this medicine contact a poison control center or emergency room at once. NOTE: This medicine is only for you. Do not share this medicine with others. What if I miss a dose? It is important not to miss your dose. Call your doctor or health care professional if you are unable to keep an appointment. What may interact with this medication? Do not take this medicine with any of the following medications: live virus vaccines This medicine may also interact with the following medications: medicines that treat or prevent blood  clots like warfarin, enoxaparin, and dalteparin This list may not describe all possible interactions. Give your health care provider a list of all the medicines, herbs, non-prescription drugs, or dietary supplements you use. Also tell them if you smoke, drink alcohol, or use illegal drugs. Some items may interact with your medicine. What should I watch for while using this medication? Visit your doctor for checks on your progress. This drug may make you feel generally unwell. This is not uncommon, as chemotherapy can affect healthy cells as well as cancer cells. Report any side effects. Continue your course of treatment even though you feel ill unless your doctor tells you to stop. In some cases, you may be given additional medicines to help with side effects. Follow all directions for their use. Call your doctor or health care professional for advice if you get a fever, chills or sore throat, or  other symptoms of a cold or flu. Do not treat yourself. This drug decreases your body's ability to fight infections. Try to avoid being around people who are sick. This medicine may increase your risk to bruise or bleed. Call your doctor or health care professional if you notice any unusual bleeding. Be careful brushing and flossing your teeth or using a toothpick because you may get an infection or bleed more easily. If you have any dental work done, tell your dentist you are receiving this medicine. Avoid taking products that contain aspirin, acetaminophen, ibuprofen, naproxen, or ketoprofen unless instructed by your doctor. These medicines may hide a fever. Do not become pregnant while taking this medicine. Women should inform their doctor if they wish to become pregnant or think they might be pregnant. There is a potential for serious side effects to an unborn child. Talk to your health care professional or pharmacist for more information. Do not breast-feed an infant while taking this medicine. Men should inform their doctor if they wish to father a child. This medicine may lower sperm counts. Do not treat diarrhea with over the counter products. Contact your doctor if you have diarrhea that lasts more than 2 days or if it is severe and watery. This medicine can make you more sensitive to the sun. Keep out of the sun. If you cannot avoid being in the sun, wear protective clothing and use sunscreen. Do not use sun lamps or tanning beds/booths. What side effects may I notice from receiving this medication? Side effects that you should report to your doctor or health care professional as soon as possible: allergic reactions like skin rash, itching or hives, swelling of the face, lips, or tongue low blood counts - this medicine may decrease the number of white blood cells, red blood cells and platelets. You may be at increased risk for infections and bleeding. signs of infection - fever or chills,  cough, sore throat, pain or difficulty passing urine signs of decreased platelets or bleeding - bruising, pinpoint red spots on the skin, black, tarry stools, blood in the urine signs of decreased red blood cells - unusually weak or tired, fainting spells, lightheadedness breathing problems changes in vision chest pain mouth sores nausea and vomiting pain, swelling, redness at site where injected pain, tingling, numbness in the hands or feet redness, swelling, or sores on hands or feet stomach pain unusual bleeding Side effects that usually do not require medical attention (report to your doctor or health care professional if they continue or are bothersome): changes in finger or toe nails diarrhea dry or itchy skin hair  loss headache loss of appetite sensitivity of eyes to the light stomach upset unusually teary eyes This list may not describe all possible side effects. Call your doctor for medical advice about side effects. You may report side effects to FDA at 1-800-FDA-1088. Where should I keep my medication? This drug is given in a hospital or clinic and will not be stored at home. NOTE: This sheet is a summary. It may not cover all possible information. If you have questions about this medicine, talk to your doctor, pharmacist, or health care provider.  2022 Elsevier/Gold Standard (2019-01-17 15:00:03)

## 2020-11-25 NOTE — Progress Notes (Signed)
Patient notified of CT scan at [facility] on 12/05/20 at 1045/1100. NPO after 7 am and drink oral contrast at 9 am and 10 am. Confirmed she has contrast. Will schedule port access at 1015 prior to scan. Does not need lab that day.

## 2020-11-25 NOTE — Progress Notes (Signed)
Patient presents for treatment. RN assessment completed along with the following:  Labs/vitals reviewed - Yes, and within treatment parameters.   Weight within 10% of previous measurement - Yes Oncology Treatment Attestation completed for current therapy- Yes, on date 06/27/20 Informed consent completed and reflects current therapy/intent - Yes, on date 07/21/20             Provider progress note reviewed - Yes, today's provider note was reviewed. Treatment/Antibody/Supportive plan reviewed - Yes, and there are no adjustments needed for today's treatment. S&H and other orders reviewed - Yes, and there are no additional orders identified. Previous treatment date reviewed - Yes, and the appropriate amount of time has elapsed between treatments.  Patient to proceed with treatment.

## 2020-11-25 NOTE — Progress Notes (Signed)
  [facility] OFFICE PROGRESS NOTE   Diagnosis: Pancreas cancer  INTERVAL HISTORY:   Kirsten [name] returns as scheduled.  She completed another cycle of FOLFIRI 11/11/2020.  She denies nausea/vomiting.  No mouth sores.  She had 4 loose stools after eating "coleslaw".  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  No abdominal pain.  She is no longer taking pain medication.  She has a good appetite.  She is gaining weight.  Objective:  Vital signs in last 24 hours:  Blood pressure (!) 144/85, pulse 89, temperature (!) 97.5 F (36.4 C), temperature source Temporal, resp. rate 18, weight 131 lb 9.6 oz (59.7 kg), SpO2 100 %.    HEENT: No thrush or ulcers. Resp: Lungs clear bilaterally. Cardio: Regular rate and rhythm. GI: Abdomen is soft and nontender.  Palpable liver edge. Vascular: No leg edema. Skin: Palms with scattered areas of mild hyperpigmentation. Port-A-Cath without erythema.   Lab Results:  Lab Results  Component Value Date   WBC 6.2 11/25/2020   HGB 11.1 (L) 11/25/2020   HCT 35.6 (L) 11/25/2020   MCV 96.0 11/25/2020   PLT 272 11/25/2020   NEUTROABS 2.6 11/25/2020    Imaging:  No results found.  Medications: I have reviewed the patient's current medications.  Assessment/Plan: Metastatic pancreas cancer Right upper quadrant ultrasound 06/17/2020-multiple hypoechoic liver lesions CT abdomen/pelvis 06/17/2020- pancreas tail mass, multiple liver metastases, enlarged periaortic nodes, right ovary metastasis?,  Nodularity at the left paracolic gutter and posterior/inferior stomach suggesting peritoneal tumor spread CT chest 06/17/2020- scattered small pulmonary nodules consistent with metastatic disease Ultrasound-guided biopsy of a left liver mass 06/18/2020- adenocarcinoma, morphology compatible with metastatic pancreatic adenocarcinoma Cycle 1 FOLFOX 07/01/2020 Cycle 2 FOLFOX 07/16/2020 Cycle 3 FOLFOX 07/31/2020 Cycle 4 FOLFIRINOX 08/13/2020 Cycle 5  FOLFIRINOX 09/03/2020, Udenyca CTs 09/13/2020-unchanged nodule medial left lower lobe; stable nodal and likely peritoneal disease in the abdomen/pelvis; unchanged pancreatic tail mass; multifocal hepatic metastases, mildly progressive Cycle 6 FOLFIRINOX 09/16/2020, Oxaliplatin held Cycle 7 FOLFIRINOX 09/30/2020 Cycle 8 FOLFIRINOX 10/14/2020, oxaliplatin held Cycle 9 FOLFIRINOX 10/28/2020, oxaliplatin held Cycle 10 FOLFIRINOX 11/11/2020, oxaliplatin held Cycle 11 FOLFIRINOX 11/25/2020, oxaliplatin held 2.  Pain secondary to #1-improved 3.  Weight loss-improved 4.  Family history of pancreas cancer 5.  Neutropenia secondary chemotherapy-G-CSF added with cycle 5 FOLFIRINOX 6.  Oxaliplatin neuropathy-oxaliplatin held 09/16/2020, 10/14/2020, 10/28/2020, 11/11/2020, 11/25/2020  Disposition: Ms. Gora appears stable.  She has completed 10 cycles of FOLFIRINOX.  Oxaliplatin has been on hold due to neuropathy symptoms.  The neuropathy symptoms are unchanged.  Plan to proceed with another cycle of chemotherapy today, hold oxaliplatin.  Restaging CTs prior to next office visit.  We reviewed the CBC from today.  Counts adequate to proceed with treatment.  She will return for lab, follow-up, chemotherapy in 2 weeks.  We are available to see her sooner if needed.    Ned Card ANP/GNP-BC   11/25/2020  9:41 AM

## 2020-11-25 NOTE — Progress Notes (Signed)
Patient seen by Ned Card NP today  Vitals are within treatment parameters.  Labs reviewed by Ned Card NP and are within treatment parameters.  Per physician team, patient is ready for treatment and there are NO modifications to the treatment plan.  Per provider pt is ok to tx, some labs resulted and reviewed

## 2020-11-26 LAB — CANCER ANTIGEN 19-9: CA 19-9: 7873 U/mL — ABNORMAL HIGH (ref 0–35)

## 2020-11-27 ENCOUNTER — Other Ambulatory Visit: Payer: Self-pay

## 2020-11-27 ENCOUNTER — Inpatient Hospital Stay: Payer: Medicaid Other

## 2020-11-27 VITALS — BP 136/79 | HR 78 | Temp 98.6°F | Resp 18

## 2020-11-27 DIAGNOSIS — C252 Malignant neoplasm of tail of pancreas: Secondary | ICD-10-CM

## 2020-11-27 DIAGNOSIS — Z5111 Encounter for antineoplastic chemotherapy: Secondary | ICD-10-CM | POA: Diagnosis not present

## 2020-11-27 MED ORDER — SODIUM CHLORIDE 0.9% FLUSH
10.0000 mL | INTRAVENOUS | Status: DC | PRN
  Administered 2020-11-27: 10 mL

## 2020-11-27 MED ORDER — HEPARIN SOD (PORK) LOCK FLUSH 100 UNIT/ML IV SOLN
500.0000 [IU] | Freq: Once | INTRAVENOUS | Status: AC | PRN
  Administered 2020-11-27: 500 [IU]

## 2020-11-27 MED ORDER — PEGFILGRASTIM-BMEZ 6 MG/0.6ML ~~LOC~~ SOSY
6.0000 mg | PREFILLED_SYRINGE | Freq: Once | SUBCUTANEOUS | Status: AC
  Administered 2020-11-27: 6 mg via SUBCUTANEOUS

## 2020-11-27 NOTE — Patient Instructions (Signed)

## 2020-12-05 ENCOUNTER — Other Ambulatory Visit: Payer: Self-pay

## 2020-12-05 ENCOUNTER — Ambulatory Visit (HOSPITAL_BASED_OUTPATIENT_CLINIC_OR_DEPARTMENT_OTHER)
Admission: RE | Admit: 2020-12-05 | Discharge: 2020-12-05 | Disposition: A | Discharge status: Home / Self Care | Payer: Medicaid Other | Source: Ambulatory Visit | Attending: Nurse Practitioner | Admitting: Nurse Practitioner

## 2020-12-05 ENCOUNTER — Encounter (HOSPITAL_BASED_OUTPATIENT_CLINIC_OR_DEPARTMENT_OTHER): Payer: Self-pay

## 2020-12-05 ENCOUNTER — Inpatient Hospital Stay: Payer: Medicaid Other | Attending: Oncology

## 2020-12-05 VITALS — BP 143/91 | HR 105 | Resp 20

## 2020-12-05 DIAGNOSIS — C7800 Secondary malignant neoplasm of unspecified lung: Secondary | ICD-10-CM | POA: Insufficient documentation

## 2020-12-05 DIAGNOSIS — T451X5A Adverse effect of antineoplastic and immunosuppressive drugs, initial encounter: Secondary | ICD-10-CM | POA: Insufficient documentation

## 2020-12-05 DIAGNOSIS — Z95828 Presence of other vascular implants and grafts: Secondary | ICD-10-CM

## 2020-12-05 DIAGNOSIS — G893 Neoplasm related pain (acute) (chronic): Secondary | ICD-10-CM | POA: Insufficient documentation

## 2020-12-05 DIAGNOSIS — Z5111 Encounter for antineoplastic chemotherapy: Secondary | ICD-10-CM | POA: Insufficient documentation

## 2020-12-05 DIAGNOSIS — C252 Malignant neoplasm of tail of pancreas: Secondary | ICD-10-CM | POA: Diagnosis present

## 2020-12-05 DIAGNOSIS — D701 Agranulocytosis secondary to cancer chemotherapy: Secondary | ICD-10-CM | POA: Insufficient documentation

## 2020-12-05 DIAGNOSIS — C787 Secondary malignant neoplasm of liver and intrahepatic bile duct: Secondary | ICD-10-CM | POA: Insufficient documentation

## 2020-12-05 MED ORDER — IOHEXOL 300 MG/ML  SOLN
100.0000 mL | Freq: Once | INTRAMUSCULAR | Status: AC | PRN
  Administered 2020-12-05: 100 mL via INTRAVENOUS

## 2020-12-05 MED ORDER — HEPARIN SOD (PORK) LOCK FLUSH 100 UNIT/ML IV SOLN
500.0000 [IU] | Freq: Once | INTRAVENOUS | Status: AC
  Administered 2020-12-05: 500 [IU] via INTRAVENOUS

## 2020-12-05 MED ORDER — SODIUM CHLORIDE 0.9% FLUSH
10.0000 mL | Freq: Once | INTRAVENOUS | Status: AC
Start: 2020-12-05 — End: 2020-12-05
  Administered 2020-12-05: 10 mL via INTRAVENOUS

## 2020-12-08 ENCOUNTER — Other Ambulatory Visit: Payer: Self-pay | Admitting: Oncology

## 2020-12-09 ENCOUNTER — Inpatient Hospital Stay: Payer: Medicaid Other

## 2020-12-09 ENCOUNTER — Encounter: Payer: Self-pay | Admitting: Nurse Practitioner

## 2020-12-09 ENCOUNTER — Other Ambulatory Visit: Payer: Self-pay

## 2020-12-09 ENCOUNTER — Inpatient Hospital Stay (HOSPITAL_BASED_OUTPATIENT_CLINIC_OR_DEPARTMENT_OTHER): Payer: Medicaid Other | Admitting: Nurse Practitioner

## 2020-12-09 VITALS — BP 158/85 | HR 100 | Temp 98.4°F | Resp 18 | Ht 63.0 in | Wt 131.8 lb

## 2020-12-09 DIAGNOSIS — C252 Malignant neoplasm of tail of pancreas: Secondary | ICD-10-CM

## 2020-12-09 DIAGNOSIS — D701 Agranulocytosis secondary to cancer chemotherapy: Secondary | ICD-10-CM | POA: Diagnosis not present

## 2020-12-09 DIAGNOSIS — C7800 Secondary malignant neoplasm of unspecified lung: Secondary | ICD-10-CM | POA: Diagnosis not present

## 2020-12-09 DIAGNOSIS — C787 Secondary malignant neoplasm of liver and intrahepatic bile duct: Secondary | ICD-10-CM | POA: Diagnosis not present

## 2020-12-09 DIAGNOSIS — Z5111 Encounter for antineoplastic chemotherapy: Secondary | ICD-10-CM | POA: Diagnosis not present

## 2020-12-09 DIAGNOSIS — G893 Neoplasm related pain (acute) (chronic): Secondary | ICD-10-CM | POA: Diagnosis not present

## 2020-12-09 DIAGNOSIS — T451X5A Adverse effect of antineoplastic and immunosuppressive drugs, initial encounter: Secondary | ICD-10-CM | POA: Diagnosis not present

## 2020-12-09 LAB — CMP (CANCER CENTER ONLY)
ALT: 44 U/L (ref 0–44)
AST: 54 U/L — ABNORMAL HIGH (ref 15–41)
Albumin: 3.6 g/dL (ref 3.5–5.0)
Alkaline Phosphatase: 194 U/L — ABNORMAL HIGH (ref 38–126)
Anion gap: 10 (ref 5–15)
BUN: 14 mg/dL (ref 8–23)
CO2: 25 mmol/L (ref 22–32)
Calcium: 9.2 mg/dL (ref 8.9–10.3)
Chloride: 99 mmol/L (ref 98–111)
Creatinine: 1.04 mg/dL — ABNORMAL HIGH (ref 0.44–1.00)
GFR, Estimated: >60 mL/min (ref >60)
Glucose, Bld: 152 mg/dL — ABNORMAL HIGH (ref 70–99)
Potassium: 3.7 mmol/L (ref 3.5–5.1)
Sodium: 134 mmol/L — ABNORMAL LOW (ref 135–145)
Total Bilirubin: 0.5 mg/dL (ref 0.3–1.2)
Total Protein: 6.8 g/dL (ref 6.5–8.1)

## 2020-12-09 LAB — CBC WITH DIFFERENTIAL (CANCER CENTER ONLY)
Abs Immature Granulocytes: 0.03 10*3/uL (ref 0.00–0.07)
Basophils Absolute: 0 10*3/uL (ref 0.0–0.1)
Basophils Relative: 0 %
Eosinophils Absolute: 0 10*3/uL (ref 0.0–0.5)
Eosinophils Relative: 0 %
HCT: 33.6 % — ABNORMAL LOW (ref 36.0–46.0)
Hemoglobin: 10.9 g/dL — ABNORMAL LOW (ref 12.0–15.0)
Immature Granulocytes: 1 %
Lymphocytes Relative: 39 %
Lymphs Abs: 2.4 10*3/uL (ref 0.7–4.0)
MCH: 30.3 pg (ref 26.0–34.0)
MCHC: 32.4 g/dL (ref 30.0–36.0)
MCV: 93.3 fL (ref 80.0–100.0)
Monocytes Absolute: 1.2 10*3/uL — ABNORMAL HIGH (ref 0.1–1.0)
Monocytes Relative: 19 %
Neutro Abs: 2.5 10*3/uL (ref 1.7–7.7)
Neutrophils Relative %: 41 %
Platelet Count: 187 10*3/uL (ref 150–400)
RBC: 3.6 MIL/uL — ABNORMAL LOW (ref 3.87–5.11)
RDW: 15.7 % — ABNORMAL HIGH (ref 11.5–15.5)
WBC Count: 6.1 10*3/uL (ref 4.0–10.5)
nRBC: 0 % (ref 0.0–0.2)

## 2020-12-09 LAB — MAGNESIUM: Magnesium: 2 mg/dL (ref 1.7–2.4)

## 2020-12-09 MED ORDER — PALONOSETRON HCL INJECTION 0.25 MG/5ML
0.2500 mg | Freq: Once | INTRAVENOUS | Status: AC
  Administered 2020-12-09: 0.25 mg via INTRAVENOUS
  Filled 2020-12-09: qty 5

## 2020-12-09 MED ORDER — SODIUM CHLORIDE 0.9 % IV SOLN
400.0000 mg/m2 | Freq: Once | INTRAVENOUS | Status: AC
  Administered 2020-12-09: 644 mg via INTRAVENOUS
  Filled 2020-12-09 (×2): qty 32.2

## 2020-12-09 MED ORDER — SODIUM CHLORIDE 0.9 % IV SOLN
400.0000 mg/m2 | Freq: Once | INTRAVENOUS | Status: DC
  Filled 2020-12-09: qty 32.2

## 2020-12-09 MED ORDER — SODIUM CHLORIDE 0.9 % IV SOLN
10.0000 mg | Freq: Once | INTRAVENOUS | Status: AC
  Administered 2020-12-09: 10 mg via INTRAVENOUS
  Filled 2020-12-09: qty 10

## 2020-12-09 MED ORDER — DEXTROSE 5 % IV SOLN: Freq: Once | INTRAVENOUS | Status: AC

## 2020-12-09 MED ORDER — SODIUM CHLORIDE 0.9 % IV SOLN
2400.0000 mg/m2 | INTRAVENOUS | Status: DC
  Administered 2020-12-09: 3850 mg via INTRAVENOUS
  Filled 2020-12-09: qty 77

## 2020-12-09 MED ORDER — SODIUM CHLORIDE 0.9 % IV SOLN
150.0000 mg | Freq: Once | INTRAVENOUS | Status: AC
  Administered 2020-12-09: 150 mg via INTRAVENOUS
  Filled 2020-12-09: qty 150

## 2020-12-09 MED ORDER — SODIUM CHLORIDE 0.9 % IV SOLN
125.0000 mg/m2 | Freq: Once | INTRAVENOUS | Status: AC
  Administered 2020-12-09: 200 mg via INTRAVENOUS
  Filled 2020-12-09: qty 10

## 2020-12-09 MED ORDER — ATROPINE SULFATE 1 MG/ML IJ SOLN
0.5000 mg | Freq: Once | INTRAMUSCULAR | Status: AC | PRN
  Administered 2020-12-09: 0.5 mg via INTRAVENOUS
  Filled 2020-12-09: qty 1

## 2020-12-09 NOTE — Patient Instructions (Signed)
The chemotherapy medication bag should finish at 46 hours, 96 hours, or 7 days. For example, if your pump is scheduled for 46 hours and it was put on at 4:00 p.m., it should finish at 2:00 p.m. the day it is scheduled to come off regardless of your appointment time.     Estimated time to finish at 1200 on Wednesday, October 12.   If the display on your pump reads "Low Volume" and it is beeping, take the batteries out of the pump and come to the cancer center for it to be taken off.   If the pump alarms go off prior to the pump reading "Low Volume" then call [phone] and someone can assist you.  If the plunger comes out and the chemotherapy medication is leaking out, please use your home chemo spill kit to clean up the spill. Do NOT use paper towels or other household products.  If you have problems or questions regarding your pump, please call either [phone] (24 hours a day) or the cancer center Monday-Friday 8:00 a.m.- 4:30 p.m. at the clinic number and we will assist you. If you are unable to get assistance, then go to the nearest Emergency Department and ask the staff to contact the IV team for assistance.  [facility]  Discharge Instructions: Thank you for choosing [facility] to provide your oncology and hematology care.   If you have a lab appointment with the [facility], please go directly to the [facility] and check in at the registration area.   Wear comfortable clothing and clothing appropriate for easy access to any Portacath or PICC line.   We strive to give you quality time with your provider. You may need to reschedule your appointment if you arrive late (15 or more minutes).  Arriving late affects you and other patients whose appointments are after yours.  Also, if you miss three or more appointments without notifying the office, you may be dismissed from the clinic at the provider's discretion.      For prescription  refill requests, have your pharmacy contact our office and allow 72 hours for refills to be completed.    Today you received the following chemotherapy and/or immunotherapy agents irinotecan, leucovorin, fluorouracil     To help prevent nausea and vomiting after your treatment, we encourage you to take your nausea medication as directed.  BELOW ARE SYMPTOMS THAT SHOULD BE REPORTED IMMEDIATELY: *FEVER GREATER THAN 100.4 F (38 C) OR HIGHER *CHILLS OR SWEATING *NAUSEA AND VOMITING THAT IS NOT CONTROLLED WITH YOUR NAUSEA MEDICATION *UNUSUAL SHORTNESS OF BREATH *UNUSUAL BRUISING OR BLEEDING *URINARY PROBLEMS (pain or burning when urinating, or frequent urination) *BOWEL PROBLEMS (unusual diarrhea, constipation, pain near the anus) TENDERNESS IN MOUTH AND THROAT WITH OR WITHOUT PRESENCE OF ULCERS (sore throat, sores in mouth, or a toothache) UNUSUAL RASH, SWELLING OR PAIN  UNUSUAL VAGINAL DISCHARGE OR ITCHING   Items with * indicate a potential emergency and should be followed up as soon as possible or go to the Emergency Department if any problems should occur.  Please show the CHEMOTHERAPY ALERT CARD or IMMUNOTHERAPY ALERT CARD at check-in to the Emergency Department and triage nurse.  Should you have questions after your visit or need to cancel or reschedule your appointment, please contact [facility]  Dept: [phone]  and follow the prompts.  Office hours are 8:00 a.m. to 4:30 p.m. Monday - Friday. Please note that voicemails left after  4:00 p.m. may not be returned until the following business day.  We are closed weekends and major holidays. You have access to a nurse at all times for urgent questions. Please call the main number to the clinic Dept: [phone] and follow the prompts.   For any non-urgent questions, you may also contact your provider using MyChart. We now offer e-Visits for anyone 38 and older to request care online for non-urgent symptoms.  For details visit [email or web address].   Also download the MyChart app! Go to the app store, search "MyChart", open the app, select [facility], and log in with your MyChart username and password.  Due to Covid, a mask is required upon entering the hospital/clinic. If you do not have a mask, one will be given to you upon arrival. For doctor visits, patients may have 1 support person aged 50 or older with them. For treatment visits, patients cannot have anyone with them due to current Covid guidelines and our immunocompromised population.   Irinotecan injection What is this medication? IRINOTECAN (ir in oh TEE kan ) is a chemotherapy drug. It is used to treat colon and rectal cancer. This medicine may be used for other purposes; ask your health care provider or pharmacist if you have questions. COMMON BRAND NAME(S): Camptosar What should I tell my care team before I take this medication? They need to know if you have any of these conditions: dehydration diarrhea infection (especially a virus infection such as chickenpox, cold sores, or herpes) liver disease low blood counts, like low white cell, platelet, or red cell counts low levels of calcium, magnesium, or potassium in the blood recent or ongoing radiation therapy an unusual or allergic reaction to irinotecan, other medicines, foods, dyes, or preservatives pregnant or trying to get pregnant breast-feeding How should I use this medication? This drug is given as an infusion into a vein. It is administered in a hospital or clinic by a specially trained health care professional. Talk to your pediatrician regarding the use of this medicine in children. Special care may be needed. Overdosage: If you think you have taken too much of this medicine contact a poison control center or emergency room at once. NOTE: This medicine is only for you. Do not share this medicine with others. What if I miss a dose? It is important not to miss your  dose. Call your doctor or health care professional if you are unable to keep an appointment. What may interact with this medication? Do not take this medicine with any of the following medications: cobicistat itraconazole This medicine may interact with the following medications: antiviral medicines for HIV or AIDS certain antibiotics like rifampin or rifabutin certain medicines for fungal infections like ketoconazole, posaconazole, and voriconazole certain medicines for seizures like carbamazepine, phenobarbital, phenotoin clarithromycin gemfibrozil nefazodone St. John's Wort This list may not describe all possible interactions. Give your health care provider a list of all the medicines, herbs, non-prescription drugs, or dietary supplements you use. Also tell them if you smoke, drink alcohol, or use illegal drugs. Some items may interact with your medicine. What should I watch for while using this medication? Your condition will be monitored carefully while you are receiving this medicine. You will need important blood work done while you are taking this medicine. This drug may make you feel generally unwell. This is not uncommon, as chemotherapy can affect healthy cells as well as cancer cells. Report any side effects. Continue your course of treatment even though you feel ill  unless your doctor tells you to stop. In some cases, you may be given additional medicines to help with side effects. Follow all directions for their use. You may get drowsy or dizzy. Do not drive, use machinery, or do anything that needs mental alertness until you know how this medicine affects you. Do not stand or sit up quickly, especially if you are an older patient. This reduces the risk of dizzy or fainting spells. Call your health care professional for advice if you get a fever, chills, or sore throat, or other symptoms of a cold or flu. Do not treat yourself. This medicine decreases your body's ability to fight  infections. Try to avoid being around people who are sick. Avoid taking products that contain aspirin, acetaminophen, ibuprofen, naproxen, or ketoprofen unless instructed by your doctor. These medicines may hide a fever. This medicine may increase your risk to bruise or bleed. Call your doctor or health care professional if you notice any unusual bleeding. Be careful brushing and flossing your teeth or using a toothpick because you may get an infection or bleed more easily. If you have any dental work done, tell your dentist you are receiving this medicine. Do not become pregnant while taking this medicine or for 6 months after stopping it. Women should inform their health care professional if they wish to become pregnant or think they might be pregnant. Men should not father a child while taking this medicine and for 3 months after stopping it. There is potential for serious side effects to an unborn child. Talk to your health care professional for more information. Do not breast-feed an infant while taking this medicine or for 7 days after stopping it. This medicine has caused ovarian failure in some women. This medicine may make it more difficult to get pregnant. Talk to your health care professional if you are concerned about your fertility. This medicine has caused decreased sperm counts in some men. This may make it more difficult to father a child. Talk to your health care professional if you are concerned about your fertility. What side effects may I notice from receiving this medication? Side effects that you should report to your doctor or health care professional as soon as possible: allergic reactions like skin rash, itching or hives, swelling of the face, lips, or tongue chest pain diarrhea flushing, runny nose, sweating during infusion low blood counts - this medicine may decrease the number of white blood cells, red blood cells and platelets. You may be at increased risk for infections  and bleeding. nausea, vomiting pain, swelling, warmth in the leg signs of decreased platelets or bleeding - bruising, pinpoint red spots on the skin, black, tarry stools, blood in the urine signs of infection - fever or chills, cough, sore throat, pain or difficulty passing urine signs of decreased red blood cells - unusually weak or tired, fainting spells, lightheadedness Side effects that usually do not require medical attention (report to your doctor or health care professional if they continue or are bothersome): constipation hair loss headache loss of appetite mouth sores stomach pain This list may not describe all possible side effects. Call your doctor for medical advice about side effects. You may report side effects to FDA at 1-800-FDA-1088. Where should I keep my medication? This drug is given in a hospital or clinic and will not be stored at home. NOTE: This sheet is a summary. It may not cover all possible information. If you have questions about this medicine, talk to  your doctor, pharmacist, or health care provider.  2022 Elsevier/Gold Standard (2019-01-17 17:46:13)  Leucovorin injection What is this medication? LEUCOVORIN (loo koe VOR in) is used to prevent or treat the harmful effects of some medicines. This medicine is used to treat anemia caused by a low amount of folic acid in the body. It is also used with 5-fluorouracil (5-FU) to treat colon cancer. This medicine may be used for other purposes; ask your health care provider or pharmacist if you have questions. What should I tell my care team before I take this medication? They need to know if you have any of these conditions: anemia from low levels of vitamin B-12 in the blood an unusual or allergic reaction to leucovorin, folic acid, other medicines, foods, dyes, or preservatives pregnant or trying to get pregnant breast-feeding How should I use this medication? This medicine is for injection into a muscle or into  a vein. It is given by a health care professional in a hospital or clinic setting. Talk to your pediatrician regarding the use of this medicine in children. Special care may be needed. Overdosage: If you think you have taken too much of this medicine contact a poison control center or emergency room at once. NOTE: This medicine is only for you. Do not share this medicine with others. What if I miss a dose? This does not apply. What may interact with this medication? capecitabine fluorouracil phenobarbital phenytoin primidone trimethoprim-sulfamethoxazole This list may not describe all possible interactions. Give your health care provider a list of all the medicines, herbs, non-prescription drugs, or dietary supplements you use. Also tell them if you smoke, drink alcohol, or use illegal drugs. Some items may interact with your medicine. What should I watch for while using this medication? Your condition will be monitored carefully while you are receiving this medicine. This medicine may increase the side effects of 5-fluorouracil, 5-FU. Tell your doctor or health care professional if you have diarrhea or mouth sores that do not get better or that get worse. What side effects may I notice from receiving this medication? Side effects that you should report to your doctor or health care professional as soon as possible: allergic reactions like skin rash, itching or hives, swelling of the face, lips, or tongue breathing problems fever, infection mouth sores unusual bleeding or bruising unusually weak or tired Side effects that usually do not require medical attention (report to your doctor or health care professional if they continue or are bothersome): constipation or diarrhea loss of appetite nausea, vomiting This list may not describe all possible side effects. Call your doctor for medical advice about side effects. You may report side effects to FDA at 1-800-FDA-1088. Where should I keep  my medication? This drug is given in a hospital or clinic and will not be stored at home. NOTE: This sheet is a summary. It may not cover all possible information. If you have questions about this medicine, talk to your doctor, pharmacist, or health care provider.  2022 Elsevier/Gold Standard (2007-08-23 16:50:29)  Fluorouracil, 5-FU injection What is this medication? FLUOROURACIL, 5-FU (flure oh YOOR a sil) is a chemotherapy drug. It slows the growth of cancer cells. This medicine is used to treat many types of cancer like breast cancer, colon or rectal cancer, pancreatic cancer, and stomach cancer. This medicine may be used for other purposes; ask your health care provider or pharmacist if you have questions. COMMON BRAND NAME(S): Adrucil What should I tell my care team before I  take this medication? They need to know if you have any of these conditions: blood disorders dihydropyrimidine dehydrogenase (DPD) deficiency infection (especially a virus infection such as chickenpox, cold sores, or herpes) kidney disease liver disease malnourished, poor nutrition recent or ongoing radiation therapy an unusual or allergic reaction to fluorouracil, other chemotherapy, other medicines, foods, dyes, or preservatives pregnant or trying to get pregnant breast-feeding How should I use this medication? This drug is given as an infusion or injection into a vein. It is administered in a hospital or clinic by a specially trained health care professional. Talk to your pediatrician regarding the use of this medicine in children. Special care may be needed. Overdosage: If you think you have taken too much of this medicine contact a poison control center or emergency room at once. NOTE: This medicine is only for you. Do not share this medicine with others. What if I miss a dose? It is important not to miss your dose. Call your doctor or health care professional if you are unable to keep an  appointment. What may interact with this medication? Do not take this medicine with any of the following medications: live virus vaccines This medicine may also interact with the following medications: medicines that treat or prevent blood clots like warfarin, enoxaparin, and dalteparin This list may not describe all possible interactions. Give your health care provider a list of all the medicines, herbs, non-prescription drugs, or dietary supplements you use. Also tell them if you smoke, drink alcohol, or use illegal drugs. Some items may interact with your medicine. What should I watch for while using this medication? Visit your doctor for checks on your progress. This drug may make you feel generally unwell. This is not uncommon, as chemotherapy can affect healthy cells as well as cancer cells. Report any side effects. Continue your course of treatment even though you feel ill unless your doctor tells you to stop. In some cases, you may be given additional medicines to help with side effects. Follow all directions for their use. Call your doctor or health care professional for advice if you get a fever, chills or sore throat, or other symptoms of a cold or flu. Do not treat yourself. This drug decreases your body's ability to fight infections. Try to avoid being around people who are sick. This medicine may increase your risk to bruise or bleed. Call your doctor or health care professional if you notice any unusual bleeding. Be careful brushing and flossing your teeth or using a toothpick because you may get an infection or bleed more easily. If you have any dental work done, tell your dentist you are receiving this medicine. Avoid taking products that contain aspirin, acetaminophen, ibuprofen, naproxen, or ketoprofen unless instructed by your doctor. These medicines may hide a fever. Do not become pregnant while taking this medicine. Women should inform their doctor if they wish to become pregnant  or think they might be pregnant. There is a potential for serious side effects to an unborn child. Talk to your health care professional or pharmacist for more information. Do not breast-feed an infant while taking this medicine. Men should inform their doctor if they wish to father a child. This medicine may lower sperm counts. Do not treat diarrhea with over the counter products. Contact your doctor if you have diarrhea that lasts more than 2 days or if it is severe and watery. This medicine can make you more sensitive to the sun. Keep out of the sun. If  you cannot avoid being in the sun, wear protective clothing and use sunscreen. Do not use sun lamps or tanning beds/booths. What side effects may I notice from receiving this medication? Side effects that you should report to your doctor or health care professional as soon as possible: allergic reactions like skin rash, itching or hives, swelling of the face, lips, or tongue low blood counts - this medicine may decrease the number of white blood cells, red blood cells and platelets. You may be at increased risk for infections and bleeding. signs of infection - fever or chills, cough, sore throat, pain or difficulty passing urine signs of decreased platelets or bleeding - bruising, pinpoint red spots on the skin, black, tarry stools, blood in the urine signs of decreased red blood cells - unusually weak or tired, fainting spells, lightheadedness breathing problems changes in vision chest pain mouth sores nausea and vomiting pain, swelling, redness at site where injected pain, tingling, numbness in the hands or feet redness, swelling, or sores on hands or feet stomach pain unusual bleeding Side effects that usually do not require medical attention (report to your doctor or health care professional if they continue or are bothersome): changes in finger or toe nails diarrhea dry or itchy skin hair loss headache loss of appetite sensitivity  of eyes to the light stomach upset unusually teary eyes This list may not describe all possible side effects. Call your doctor for medical advice about side effects. You may report side effects to FDA at 1-800-FDA-1088. Where should I keep my medication? This drug is given in a hospital or clinic and will not be stored at home. NOTE: This sheet is a summary. It may not cover all possible information. If you have questions about this medicine, talk to your doctor, pharmacist, or health care provider.  2022 Elsevier/Gold Standard (2019-01-17 15:00:03)

## 2020-12-09 NOTE — Progress Notes (Signed)
Patient presents for treatment. RN assessment completed along with the following:  Labs/vitals reviewed - Yes, and within treatment parameters.  AST 54 Ok to treat Weight within 10% of previous measurement - Yes Oncology Treatment Attestation completed for current therapy- Yes, on date 06/27/20 Informed consent completed and reflects current therapy/intent - Yes, on date 07/01/20             Provider progress note reviewed - Yes, today's provider note was reviewed. Treatment/Antibody/Supportive plan reviewed - Yes, and there are no adjustments needed for today's treatment. S&H and other orders reviewed - Yes, and there are no additional orders identified. Previous treatment date reviewed - Yes, and the appropriate amount of time has elapsed between treatments. Clinic Hand Off Received from - Lattie Haw T,NP   Patient to proceed with treatment.

## 2020-12-09 NOTE — Progress Notes (Signed)
[facility] OFFICE PROGRESS NOTE   Diagnosis: Pancreas cancer  INTERVAL HISTORY:   Kirsten [name] returns as scheduled.  She completed another cycle of FOLFIRI 11/25/2020.  She denies nausea/vomiting.  No mouth sores.  No diarrhea.  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  No pain.  Objective:  Vital signs in last 24 hours:  Blood pressure (!) 158/85, pulse 100, temperature 98.4 F (36.9 C), temperature source Oral, resp. rate 18, height 5\' 3"  (1.6 m), weight 131 lb 12.8 oz (59.8 kg), SpO2 100 %.    HEENT: No thrush or ulcers. Resp: Lungs clear bilaterally. Cardio: Regular rate and rhythm. GI: Abdomen soft and nontender.  No hepatomegaly. Vascular: No leg edema. Skin: Palms without erythema. Port-A-Cath without erythema.   Lab Results:  Lab Results  Component Value Date   WBC 6.1 12/09/2020   HGB 10.9 (L) 12/09/2020   HCT 33.6 (L) 12/09/2020   MCV 93.3 12/09/2020   PLT 187 12/09/2020   NEUTROABS 2.5 12/09/2020    Imaging:  No results found.  Medications: I have reviewed the patient's current medications.  Assessment/Plan: Metastatic pancreas cancer Right upper quadrant ultrasound 06/17/2020-multiple hypoechoic liver lesions CT abdomen/pelvis 06/17/2020- pancreas tail mass, multiple liver metastases, enlarged periaortic nodes, right ovary metastasis?,  Nodularity at the left paracolic gutter and posterior/inferior stomach suggesting peritoneal tumor spread CT chest 06/17/2020- scattered small pulmonary nodules consistent with metastatic disease Ultrasound-guided biopsy of a left liver mass 06/18/2020- adenocarcinoma, morphology compatible with metastatic pancreatic adenocarcinoma Cycle 1 FOLFOX 07/01/2020 Cycle 2 FOLFOX 07/16/2020 Cycle 3 FOLFOX 07/31/2020 Cycle 4 FOLFIRINOX 08/13/2020 Cycle 5 FOLFIRINOX 09/03/2020, Udenyca CTs 09/13/2020-unchanged nodule medial left lower lobe; stable nodal and likely peritoneal disease in the abdomen/pelvis;  unchanged pancreatic tail mass; multifocal hepatic metastases, mildly progressive Cycle 6 FOLFIRINOX 09/16/2020, Oxaliplatin held Cycle 7 FOLFIRINOX 09/30/2020 Cycle 8 FOLFIRINOX 10/14/2020, oxaliplatin held Cycle 9 FOLFIRINOX 10/28/2020, oxaliplatin held Cycle 10 FOLFIRINOX 11/11/2020, oxaliplatin held Cycle 11 FOLFIRINOX 11/25/2020, oxaliplatin held CTs 12/06/2020-primary pancreatic tail tumor decreased in size.  Widespread liver metastases all decreased.  Previously visualized peritoneal nodule adjacent to the distal stomach resolved.  Left lower lobe pulmonary nodule stable.  No new or progressive metastatic disease. Cycle 12 FOLFIRI 2.  Pain secondary to #1-improved 3.  Weight loss-improved 4.  Family history of pancreas cancer 5.  Neutropenia secondary chemotherapy-G-CSF added with cycle 5 FOLFIRINOX 6.  Oxaliplatin neuropathy-oxaliplatin held 09/16/2020, 10/14/2020, 10/28/2020, 11/11/2020, 11/25/2020  Disposition: Kirsten [name] appears stable.  She has completed 11 cycles of FOLFIRINOX.  Oxaliplatin has been on hold due to neuropathy symptoms.  Restaging CTs show continued improvement, no new or progressive disease.  Report/images reviewed with Kirsten [name] and her daughter.  Dr. Benay Spice recommends continuation of FOLFIRI, no further Oxaliplatin planned due to persistent/worse neuropathy symptoms.  Cycle 12 FOLFIRI today as scheduled.  CBC and chemistry panel reviewed.  Labs adequate to proceed with FOLFIRI as above.  She will return for lab, follow-up, cycle 13 FOLFIRI in 2 weeks.  She will contact the office in the interim with any problems.  Patient seen with Dr. Benay Spice.  Ned Card ANP/GNP-BC   12/09/2020  9:36 AM This was a shared visit with Ned Card.  We reviewed the CT findings and images with Kirsten [name] and her daughter.  The primary pancreas tumor and liver metastases are decreased in size.  We recommend continuing systemic therapy.  Oxaliplatin will remain on hold secondary to  oxaliplatin neuropathy.  I was present for greater than  50% of today's visit.  I performed medical decision making.  Kirsten Manson, MD

## 2020-12-10 LAB — CANCER ANTIGEN 19-9: CA 19-9: 5654 U/mL — ABNORMAL HIGH (ref 0–35)

## 2020-12-11 ENCOUNTER — Other Ambulatory Visit: Payer: Self-pay

## 2020-12-11 ENCOUNTER — Inpatient Hospital Stay: Payer: Medicaid Other

## 2020-12-11 VITALS — BP 131/78 | HR 74 | Temp 98.1°F | Resp 18

## 2020-12-11 DIAGNOSIS — Z5111 Encounter for antineoplastic chemotherapy: Secondary | ICD-10-CM | POA: Diagnosis not present

## 2020-12-11 DIAGNOSIS — C252 Malignant neoplasm of tail of pancreas: Secondary | ICD-10-CM

## 2020-12-11 MED ORDER — PEGFILGRASTIM-BMEZ 6 MG/0.6ML ~~LOC~~ SOSY
6.0000 mg | PREFILLED_SYRINGE | Freq: Once | SUBCUTANEOUS | Status: AC
  Administered 2020-12-11: 6 mg via SUBCUTANEOUS

## 2020-12-11 MED ORDER — SODIUM CHLORIDE 0.9% FLUSH
10.0000 mL | INTRAVENOUS | Status: DC | PRN
  Administered 2020-12-11: 10 mL

## 2020-12-11 MED ORDER — HEPARIN SOD (PORK) LOCK FLUSH 100 UNIT/ML IV SOLN
500.0000 [IU] | Freq: Once | INTRAVENOUS | Status: AC | PRN
  Administered 2020-12-11: 500 [IU]

## 2020-12-11 NOTE — Patient Instructions (Signed)

## 2020-12-22 ENCOUNTER — Other Ambulatory Visit: Payer: Self-pay | Admitting: Oncology

## 2020-12-23 ENCOUNTER — Inpatient Hospital Stay (HOSPITAL_BASED_OUTPATIENT_CLINIC_OR_DEPARTMENT_OTHER): Payer: Medicaid Other | Admitting: Oncology

## 2020-12-23 ENCOUNTER — Inpatient Hospital Stay: Payer: Medicaid Other

## 2020-12-23 ENCOUNTER — Telehealth: Payer: Self-pay

## 2020-12-23 ENCOUNTER — Other Ambulatory Visit: Payer: Self-pay

## 2020-12-23 VITALS — BP 141/93 | HR 100 | Temp 97.8°F | Resp 18 | Ht 63.0 in | Wt 128.2 lb

## 2020-12-23 DIAGNOSIS — C252 Malignant neoplasm of tail of pancreas: Secondary | ICD-10-CM

## 2020-12-23 DIAGNOSIS — C787 Secondary malignant neoplasm of liver and intrahepatic bile duct: Secondary | ICD-10-CM

## 2020-12-23 DIAGNOSIS — Z5111 Encounter for antineoplastic chemotherapy: Secondary | ICD-10-CM | POA: Diagnosis not present

## 2020-12-23 LAB — CBC WITH DIFFERENTIAL (CANCER CENTER ONLY)
Abs Immature Granulocytes: 0.3 10*3/uL — ABNORMAL HIGH (ref 0.00–0.07)
Band Neutrophils: 3 %
Basophils Absolute: 0.1 10*3/uL (ref 0.0–0.1)
Basophils Relative: 1 %
Eosinophils Absolute: 0.1 10*3/uL (ref 0.0–0.5)
Eosinophils Relative: 1 %
HCT: 36.9 % (ref 36.0–46.0)
Hemoglobin: 11.6 g/dL — ABNORMAL LOW (ref 12.0–15.0)
Lymphocytes Relative: 42 %
Lymphs Abs: 2.6 10*3/uL (ref 0.7–4.0)
MCH: 30.4 pg (ref 26.0–34.0)
MCHC: 31.4 g/dL (ref 30.0–36.0)
MCV: 96.6 fL (ref 80.0–100.0)
Metamyelocytes Relative: 2 %
Monocytes Absolute: 0.6 10*3/uL (ref 0.1–1.0)
Monocytes Relative: 10 %
Myelocytes: 3 %
Neutro Abs: 2.6 10*3/uL (ref 1.7–7.7)
Neutrophils Relative %: 38 %
Platelet Count: 187 10*3/uL (ref 150–400)
RBC: 3.82 MIL/uL — ABNORMAL LOW (ref 3.87–5.11)
RDW: 15.2 % (ref 11.5–15.5)
WBC Count: 6.3 10*3/uL (ref 4.0–10.5)
nRBC: 0 % (ref 0.0–0.2)

## 2020-12-23 LAB — CMP (CANCER CENTER ONLY)
ALT: 18 U/L (ref 0–44)
AST: 25 U/L (ref 15–41)
Albumin: 3.5 g/dL (ref 3.5–5.0)
Alkaline Phosphatase: 176 U/L — ABNORMAL HIGH (ref 38–126)
Anion gap: 7 (ref 5–15)
BUN: 7 mg/dL — ABNORMAL LOW (ref 8–23)
CO2: 26 mmol/L (ref 22–32)
Calcium: 9.1 mg/dL (ref 8.9–10.3)
Chloride: 108 mmol/L (ref 98–111)
Creatinine: 0.79 mg/dL (ref 0.44–1.00)
GFR, Estimated: >60 mL/min (ref >60)
Glucose, Bld: 89 mg/dL (ref 70–99)
Potassium: 3.8 mmol/L (ref 3.5–5.1)
Sodium: 141 mmol/L (ref 135–145)
Total Bilirubin: 0.4 mg/dL (ref 0.3–1.2)
Total Protein: 6.6 g/dL (ref 6.5–8.1)

## 2020-12-23 LAB — MAGNESIUM: Magnesium: 1.9 mg/dL (ref 1.7–2.4)

## 2020-12-23 MED ORDER — SODIUM CHLORIDE 0.9 % IV SOLN
150.0000 mg | Freq: Once | INTRAVENOUS | Status: AC
  Administered 2020-12-23: 150 mg via INTRAVENOUS
  Filled 2020-12-23: qty 150

## 2020-12-23 MED ORDER — SODIUM CHLORIDE 0.9 % IV SOLN
400.0000 mg/m2 | Freq: Once | INTRAVENOUS | Status: AC
  Administered 2020-12-23: 644 mg via INTRAVENOUS
  Filled 2020-12-23: qty 32.2

## 2020-12-23 MED ORDER — SODIUM CHLORIDE 0.9 % IV SOLN
400.0000 mg/m2 | Freq: Once | INTRAVENOUS | Status: DC
  Filled 2020-12-23: qty 32.2

## 2020-12-23 MED ORDER — SODIUM CHLORIDE 0.9 % IV SOLN
Freq: Once | INTRAVENOUS | Status: AC
Start: 2020-12-23 — End: 2020-12-23

## 2020-12-23 MED ORDER — PALONOSETRON HCL INJECTION 0.25 MG/5ML
0.2500 mg | Freq: Once | INTRAVENOUS | Status: AC
  Administered 2020-12-23: 0.25 mg via INTRAVENOUS
  Filled 2020-12-23: qty 5

## 2020-12-23 MED ORDER — SODIUM CHLORIDE 0.9 % IV SOLN
2400.0000 mg/m2 | INTRAVENOUS | Status: DC
  Administered 2020-12-23: 3850 mg via INTRAVENOUS
  Filled 2020-12-23: qty 77

## 2020-12-23 MED ORDER — SODIUM CHLORIDE 0.9 % IV SOLN
125.0000 mg/m2 | Freq: Once | INTRAVENOUS | Status: AC
  Administered 2020-12-23: 200 mg via INTRAVENOUS
  Filled 2020-12-23: qty 10

## 2020-12-23 MED ORDER — ATROPINE SULFATE 1 MG/ML IV SOLN
0.5000 mg | Freq: Once | INTRAVENOUS | Status: AC | PRN
  Administered 2020-12-23: 0.5 mg via INTRAVENOUS
  Filled 2020-12-23: qty 1

## 2020-12-23 MED ORDER — SODIUM CHLORIDE 0.9 % IV SOLN
10.0000 mg | Freq: Once | INTRAVENOUS | Status: AC
  Administered 2020-12-23: 10 mg via INTRAVENOUS
  Filled 2020-12-23: qty 10

## 2020-12-23 MED ORDER — SODIUM CHLORIDE 0.9 % IV SOLN: Freq: Once | INTRAVENOUS | Status: AC

## 2020-12-23 NOTE — Patient Instructions (Signed)
[facility]  Discharge Instructions: Thank you for choosing [facility] to provide your oncology and hematology care.   If you have a lab appointment with the [facility], please go directly to the [facility] and check in at the registration area.   Wear comfortable clothing and clothing appropriate for easy access to any Portacath or PICC line.   We strive to give you quality time with your provider. You may need to reschedule your appointment if you arrive late (15 or more minutes).  Arriving late affects you and other patients whose appointments are after yours.  Also, if you miss three or more appointments without notifying the office, you may be dismissed from the clinic at the provider's discretion.      For prescription refill requests, have your pharmacy contact our office and allow 72 hours for refills to be completed.    Today you received the following chemotherapy and/or immunotherapy agents irinotecan, leucovorin, fluorouracil      To help prevent nausea and vomiting after your treatment, we encourage you to take your nausea medication as directed.  BELOW ARE SYMPTOMS THAT SHOULD BE REPORTED IMMEDIATELY: *FEVER GREATER THAN 100.4 F (38 C) OR HIGHER *CHILLS OR SWEATING *NAUSEA AND VOMITING THAT IS NOT CONTROLLED WITH YOUR NAUSEA MEDICATION *UNUSUAL SHORTNESS OF BREATH *UNUSUAL BRUISING OR BLEEDING *URINARY PROBLEMS (pain or burning when urinating, or frequent urination) *BOWEL PROBLEMS (unusual diarrhea, constipation, pain near the anus) TENDERNESS IN MOUTH AND THROAT WITH OR WITHOUT PRESENCE OF ULCERS (sore throat, sores in mouth, or a toothache) UNUSUAL RASH, SWELLING OR PAIN  UNUSUAL VAGINAL DISCHARGE OR ITCHING   Items with * indicate a potential emergency and should be followed up as soon as possible or go to the Emergency Department if any problems should occur.  Please show the CHEMOTHERAPY ALERT CARD or IMMUNOTHERAPY  ALERT CARD at check-in to the Emergency Department and triage nurse.  Should you have questions after your visit or need to cancel or reschedule your appointment, please contact [facility]  Dept: [phone]  and follow the prompts.  Office hours are 8:00 a.m. to 4:30 p.m. Monday - Friday. Please note that voicemails left after 4:00 p.m. may not be returned until the following business day.  We are closed weekends and major holidays. You have access to a nurse at all times for urgent questions. Please call the main number to the clinic Dept: [phone] and follow the prompts.   For any non-urgent questions, you may also contact your provider using MyChart. We now offer e-Visits for anyone 14 and older to request care online for non-urgent symptoms. For details visit [email or web address].   Also download the MyChart app! Go to the app store, search "MyChart", open the app, select [facility], and log in with your MyChart username and password.  Due to Covid, a mask is required upon entering the hospital/clinic. If you do not have a mask, one will be given to you upon arrival. For doctor visits, patients may have 1 support person aged 24 or older with them. For treatment visits, patients cannot have anyone with them due to current Covid guidelines and our immunocompromised population.   Irinotecan injection What is this medication? IRINOTECAN (ir in oh TEE kan ) is a chemotherapy drug. It is used to treat colon and rectal cancer. This medicine may be used for other purposes; ask your health care provider or pharmacist if you have questions. COMMON BRAND  NAME(S): Camptosar What should I tell my care team before I take this medication? They need to know if you have any of these conditions: dehydration diarrhea infection (especially a virus infection such as chickenpox, cold sores, or herpes) liver disease low blood counts, like low white cell, platelet, or red cell  counts low levels of calcium, magnesium, or potassium in the blood recent or ongoing radiation therapy an unusual or allergic reaction to irinotecan, other medicines, foods, dyes, or preservatives pregnant or trying to get pregnant breast-feeding How should I use this medication? This drug is given as an infusion into a vein. It is administered in a hospital or clinic by a specially trained health care professional. Talk to your pediatrician regarding the use of this medicine in children. Special care may be needed. Overdosage: If you think you have taken too much of this medicine contact a poison control center or emergency room at once. NOTE: This medicine is only for you. Do not share this medicine with others. What if I miss a dose? It is important not to miss your dose. Call your doctor or health care professional if you are unable to keep an appointment. What may interact with this medication? Do not take this medicine with any of the following medications: cobicistat itraconazole This medicine may interact with the following medications: antiviral medicines for HIV or AIDS certain antibiotics like rifampin or rifabutin certain medicines for fungal infections like ketoconazole, posaconazole, and voriconazole certain medicines for seizures like carbamazepine, phenobarbital, phenotoin clarithromycin gemfibrozil nefazodone [facility]. John's Wort This list may not describe all possible interactions. Give your health care provider a list of all the medicines, herbs, non-prescription drugs, or dietary supplements you use. Also tell them if you smoke, drink alcohol, or use illegal drugs. Some items may interact with your medicine. What should I watch for while using this medication? Your condition will be monitored carefully while you are receiving this medicine. You will need important blood work done while you are taking this medicine. This drug may make you feel generally unwell. This is not  uncommon, as chemotherapy can affect healthy cells as well as cancer cells. Report any side effects. Continue your course of treatment even though you feel ill unless your doctor tells you to stop. In some cases, you may be given additional medicines to help with side effects. Follow all directions for their use. You may get drowsy or dizzy. Do not drive, use machinery, or do anything that needs mental alertness until you know how this medicine affects you. Do not stand or sit up quickly, especially if you are an older patient. This reduces the risk of dizzy or fainting spells. Call your health care professional for advice if you get a fever, chills, or sore throat, or other symptoms of a cold or flu. Do not treat yourself. This medicine decreases your body's ability to fight infections. Try to avoid being around people who are sick. Avoid taking products that contain aspirin, acetaminophen, ibuprofen, naproxen, or ketoprofen unless instructed by your doctor. These medicines may hide a fever. This medicine may increase your risk to bruise or bleed. Call your doctor or health care professional if you notice any unusual bleeding. Be careful brushing and flossing your teeth or using a toothpick because you may get an infection or bleed more easily. If you have any dental work done, tell your dentist you are receiving this medicine. Do not become pregnant while taking this medicine or for 6 months after stopping  it. Women should inform their health care professional if they wish to become pregnant or think they might be pregnant. Men should not father a child while taking this medicine and for 3 months after stopping it. There is potential for serious side effects to an unborn child. Talk to your health care professional for more information. Do not breast-feed an infant while taking this medicine or for 7 days after stopping it. This medicine has caused ovarian failure in some women. This medicine may make it  more difficult to get pregnant. Talk to your health care professional if you are concerned about your fertility. This medicine has caused decreased sperm counts in some men. This may make it more difficult to father a child. Talk to your health care professional if you are concerned about your fertility. What side effects may I notice from receiving this medication? Side effects that you should report to your doctor or health care professional as soon as possible: allergic reactions like skin rash, itching or hives, swelling of the face, lips, or tongue chest pain diarrhea flushing, runny nose, sweating during infusion low blood counts - this medicine may decrease the number of white blood cells, red blood cells and platelets. You may be at increased risk for infections and bleeding. nausea, vomiting pain, swelling, warmth in the leg signs of decreased platelets or bleeding - bruising, pinpoint red spots on the skin, black, tarry stools, blood in the urine signs of infection - fever or chills, cough, sore throat, pain or difficulty passing urine signs of decreased red blood cells - unusually weak or tired, fainting spells, lightheadedness Side effects that usually do not require medical attention (report to your doctor or health care professional if they continue or are bothersome): constipation hair loss headache loss of appetite mouth sores stomach pain This list may not describe all possible side effects. Call your doctor for medical advice about side effects. You may report side effects to FDA at 1-800-FDA-1088. Where should I keep my medication? This drug is given in a hospital or clinic and will not be stored at home. NOTE: This sheet is a summary. It may not cover all possible information. If you have questions about this medicine, talk to your doctor, pharmacist, or health care provider.  2022 Elsevier/Gold Standard (2019-01-17 17:46:13)  Leucovorin injection What is this  medication? LEUCOVORIN (loo koe VOR in) is used to prevent or treat the harmful effects of some medicines. This medicine is used to treat anemia caused by a low amount of folic acid in the body. It is also used with 5-fluorouracil (5-FU) to treat colon cancer. This medicine may be used for other purposes; ask your health care provider or pharmacist if you have questions. What should I tell my care team before I take this medication? They need to know if you have any of these conditions: anemia from low levels of vitamin B-12 in the blood an unusual or allergic reaction to leucovorin, folic acid, other medicines, foods, dyes, or preservatives pregnant or trying to get pregnant breast-feeding How should I use this medication? This medicine is for injection into a muscle or into a vein. It is given by a health care professional in a hospital or clinic setting. Talk to your pediatrician regarding the use of this medicine in children. Special care may be needed. Overdosage: If you think you have taken too much of this medicine contact a poison control center or emergency room at once. NOTE: This medicine is only for  you. Do not share this medicine with others. What if I miss a dose? This does not apply. What may interact with this medication? capecitabine fluorouracil phenobarbital phenytoin primidone trimethoprim-sulfamethoxazole This list may not describe all possible interactions. Give your health care provider a list of all the medicines, herbs, non-prescription drugs, or dietary supplements you use. Also tell them if you smoke, drink alcohol, or use illegal drugs. Some items may interact with your medicine. What should I watch for while using this medication? Your condition will be monitored carefully while you are receiving this medicine. This medicine may increase the side effects of 5-fluorouracil, 5-FU. Tell your doctor or health care professional if you have diarrhea or mouth sores  that do not get better or that get worse. What side effects may I notice from receiving this medication? Side effects that you should report to your doctor or health care professional as soon as possible: allergic reactions like skin rash, itching or hives, swelling of the face, lips, or tongue breathing problems fever, infection mouth sores unusual bleeding or bruising unusually weak or tired Side effects that usually do not require medical attention (report to your doctor or health care professional if they continue or are bothersome): constipation or diarrhea loss of appetite nausea, vomiting This list may not describe all possible side effects. Call your doctor for medical advice about side effects. You may report side effects to FDA at 1-800-FDA-1088. Where should I keep my medication? This drug is given in a hospital or clinic and will not be stored at home. NOTE: This sheet is a summary. It may not cover all possible information. If you have questions about this medicine, talk to your doctor, pharmacist, or health care provider.  2022 Elsevier/Gold Standard (2007-08-23 16:50:29)  Fluorouracil, 5-FU injection What is this medication? FLUOROURACIL, 5-FU (flure oh YOOR a sil) is a chemotherapy drug. It slows the growth of cancer cells. This medicine is used to treat many types of cancer like breast cancer, colon or rectal cancer, pancreatic cancer, and stomach cancer. This medicine may be used for other purposes; ask your health care provider or pharmacist if you have questions. COMMON BRAND NAME(S): Adrucil What should I tell my care team before I take this medication? They need to know if you have any of these conditions: blood disorders dihydropyrimidine dehydrogenase (DPD) deficiency infection (especially a virus infection such as chickenpox, cold sores, or herpes) kidney disease liver disease malnourished, poor nutrition recent or ongoing radiation therapy an unusual or  allergic reaction to fluorouracil, other chemotherapy, other medicines, foods, dyes, or preservatives pregnant or trying to get pregnant breast-feeding How should I use this medication? This drug is given as an infusion or injection into a vein. It is administered in a hospital or clinic by a specially trained health care professional. Talk to your pediatrician regarding the use of this medicine in children. Special care may be needed. Overdosage: If you think you have taken too much of this medicine contact a poison control center or emergency room at once. NOTE: This medicine is only for you. Do not share this medicine with others. What if I miss a dose? It is important not to miss your dose. Call your doctor or health care professional if you are unable to keep an appointment. What may interact with this medication? Do not take this medicine with any of the following medications: live virus vaccines This medicine may also interact with the following medications: medicines that treat or prevent blood clots  like warfarin, enoxaparin, and dalteparin This list may not describe all possible interactions. Give your health care provider a list of all the medicines, herbs, non-prescription drugs, or dietary supplements you use. Also tell them if you smoke, drink alcohol, or use illegal drugs. Some items may interact with your medicine. What should I watch for while using this medication? Visit your doctor for checks on your progress. This drug may make you feel generally unwell. This is not uncommon, as chemotherapy can affect healthy cells as well as cancer cells. Report any side effects. Continue your course of treatment even though you feel ill unless your doctor tells you to stop. In some cases, you may be given additional medicines to help with side effects. Follow all directions for their use. Call your doctor or health care professional for advice if you get a fever, chills or sore throat, or  other symptoms of a cold or flu. Do not treat yourself. This drug decreases your body's ability to fight infections. Try to avoid being around people who are sick. This medicine may increase your risk to bruise or bleed. Call your doctor or health care professional if you notice any unusual bleeding. Be careful brushing and flossing your teeth or using a toothpick because you may get an infection or bleed more easily. If you have any dental work done, tell your dentist you are receiving this medicine. Avoid taking products that contain aspirin, acetaminophen, ibuprofen, naproxen, or ketoprofen unless instructed by your doctor. These medicines may hide a fever. Do not become pregnant while taking this medicine. Women should inform their doctor if they wish to become pregnant or think they might be pregnant. There is a potential for serious side effects to an unborn child. Talk to your health care professional or pharmacist for more information. Do not breast-feed an infant while taking this medicine. Men should inform their doctor if they wish to father a child. This medicine may lower sperm counts. Do not treat diarrhea with over the counter products. Contact your doctor if you have diarrhea that lasts more than 2 days or if it is severe and watery. This medicine can make you more sensitive to the sun. Keep out of the sun. If you cannot avoid being in the sun, wear protective clothing and use sunscreen. Do not use sun lamps or tanning beds/booths. What side effects may I notice from receiving this medication? Side effects that you should report to your doctor or health care professional as soon as possible: allergic reactions like skin rash, itching or hives, swelling of the face, lips, or tongue low blood counts - this medicine may decrease the number of white blood cells, red blood cells and platelets. You may be at increased risk for infections and bleeding. signs of infection - fever or chills,  cough, sore throat, pain or difficulty passing urine signs of decreased platelets or bleeding - bruising, pinpoint red spots on the skin, black, tarry stools, blood in the urine signs of decreased red blood cells - unusually weak or tired, fainting spells, lightheadedness breathing problems changes in vision chest pain mouth sores nausea and vomiting pain, swelling, redness at site where injected pain, tingling, numbness in the hands or feet redness, swelling, or sores on hands or feet stomach pain unusual bleeding Side effects that usually do not require medical attention (report to your doctor or health care professional if they continue or are bothersome): changes in finger or toe nails diarrhea dry or itchy skin hair loss  headache loss of appetite sensitivity of eyes to the light stomach upset unusually teary eyes This list may not describe all possible side effects. Call your doctor for medical advice about side effects. You may report side effects to FDA at 1-800-FDA-1088. Where should I keep my medication? This drug is given in a hospital or clinic and will not be stored at home. NOTE: This sheet is a summary. It may not cover all possible information. If you have questions about this medicine, talk to your doctor, pharmacist, or health care provider.  2022 Elsevier/Gold Standard (2019-01-17 15:00:03)  The chemotherapy medication bag should finish at 46 hours, 96 hours, or 7 days. For example, if your pump is scheduled for 46 hours and it was put on at 4:00 p.m., it should finish at 2:00 p.m. the day it is scheduled to come off regardless of your appointment time.     Estimated time to finish at 62, Wednesday December 25, 2020.   If the display on your pump reads "Low Volume" and it is beeping, take the batteries out of the pump and come to the cancer center for it to be taken off.   If the pump alarms go off prior to the pump reading "Low Volume" then call [phone]  and someone can assist you.  If the plunger comes out and the chemotherapy medication is leaking out, please use your home chemo spill kit to clean up the spill. Do NOT use paper towels or other household products.  If you have problems or questions regarding your pump, please call either [phone] (24 hours a day) or the cancer center Monday-Friday 8:00 a.m.- 4:30 p.m. at the clinic number and we will assist you. If you are unable to get assistance, then go to the nearest Emergency Department and ask the staff to contact the IV team for assistance.

## 2020-12-23 NOTE — Telephone Encounter (Signed)
Patient seen by Dr. Sherrill today ? ?Vitals are within treatment parameters. ? ?Labs reviewed by Dr. Sherrill and are within treatment parameters. ? ?Per physician team, patient is ready for treatment and there are NO modifications to the treatment plan.  ?

## 2020-12-23 NOTE — Progress Notes (Signed)
Patient presents for treatment. RN assessment completed along with the following:  Labs/vitals reviewed - Yes, and within treatment parameters.   Weight within 10% of previous measurement - Yes Oncology Treatment Attestation completed for current therapy- Yes, on date 06/27/20 Informed consent completed and reflects current therapy/intent - Yes, on date 07/21/20             Provider progress note reviewed - Yes, today's provider note was reviewed. Treatment/Antibody/Supportive plan reviewed - Yes, and there are no adjustments needed for today's treatment. S&H and other orders reviewed - Yes, and there are no additional orders identified. Previous treatment date reviewed - Yes, and the appropriate amount of time has elapsed between treatments. Clinic Hand Off Received from - none  Patient to proceed with treatment.

## 2020-12-23 NOTE — Progress Notes (Signed)
[name]   Diagnosis: Pancreas cancer  INTERVAL HISTORY:   Kirsten [name] returns as scheduled.  She complete another cycle of FOLFIRI on 12/09/2020.  She reports 2 episodes of diarrhea over the past 2 weeks.  No nausea/vomiting or mouth sores.  She continues to have numbness in the hands and feet.  She has difficulty with buttons.  Objective:  Vital signs in last 24 hours:  Blood pressure (!) 141/93, pulse 100, temperature 97.8 F (36.6 C), temperature source Oral, resp. rate 18, height 5\' 3"  (1.6 m), weight 128 lb 3.2 oz (58.2 kg), SpO2 100 %.    HEENT: No thrush or ulcers Resp: Lungs clear bilaterally Cardio: Regular rate and rhythm GI: No hepatosplenomegaly Vascular: No leg edema  Skin: Mild hyperpigmentation of the hands  Portacath/PICC-without erythema  Lab Results:  Lab Results  Component Value Date   WBC 6.3 12/23/2020   HGB 11.6 (L) 12/23/2020   HCT 36.9 12/23/2020   MCV 96.6 12/23/2020   PLT 187 12/23/2020   NEUTROABS PENDING 12/23/2020    CMP  Lab Results  Component Value Date   NA 141 12/23/2020   K 3.8 12/23/2020   CL 108 12/23/2020   CO2 26 12/23/2020   GLUCOSE 89 12/23/2020   BUN 7 (L) 12/23/2020   CREATININE 0.79 12/23/2020   CALCIUM 9.1 12/23/2020   PROT 6.6 12/23/2020   ALBUMIN 3.5 12/23/2020   AST 25 12/23/2020   ALT 18 12/23/2020   ALKPHOS 176 (H) 12/23/2020   BILITOT 0.4 12/23/2020   GFRNONAA >60 12/23/2020    Lab Results  Component Value Date   [name] 5,654 (H) 12/09/2020    Medications: I have reviewed the patient's current medications.   Assessment/Plan: Metastatic pancreas cancer Right upper quadrant ultrasound 06/17/2020-multiple hypoechoic liver lesions CT abdomen/pelvis 06/17/2020- pancreas tail mass, multiple liver metastases, enlarged periaortic nodes, right ovary metastasis?,  Nodularity at the left paracolic gutter and posterior/inferior stomach suggesting peritoneal tumor  spread CT chest 06/17/2020- scattered small pulmonary nodules consistent with metastatic disease Ultrasound-guided biopsy of a left liver mass 06/18/2020- adenocarcinoma, morphology compatible with metastatic pancreatic adenocarcinoma Cycle 1 FOLFOX 07/01/2020 Cycle 2 FOLFOX 07/16/2020 Cycle 3 FOLFOX 07/31/2020 Cycle 4 FOLFIRINOX 08/13/2020 Cycle 5 FOLFIRINOX 09/03/2020, Udenyca CTs 09/13/2020-unchanged nodule medial left lower lobe; stable nodal and likely peritoneal disease in the abdomen/pelvis; unchanged pancreatic tail mass; multifocal hepatic metastases, mildly progressive Cycle 6 FOLFIRINOX 09/16/2020, Oxaliplatin held Cycle 7 FOLFIRINOX 09/30/2020 Cycle 8 FOLFIRINOX 10/14/2020, oxaliplatin held Cycle 9 FOLFIRINOX 10/28/2020, oxaliplatin held Cycle 10 FOLFIRINOX 11/11/2020, oxaliplatin held Cycle 11 FOLFIRINOX 11/25/2020, oxaliplatin held CTs 12/06/2020-primary pancreatic tail tumor decreased in size.  Widespread liver metastases all decreased.  Previously visualized peritoneal nodule adjacent to the distal stomach resolved.  Left lower lobe pulmonary nodule stable.  No new or progressive metastatic disease. Cycle 12 FOLFIRI 12/09/2020 Cycle 13 FOLFIRI 12/23/2020 2.  Pain secondary to #1-improved 3.  Weight loss-improved 4.  Family history of pancreas cancer 5.  Neutropenia secondary chemotherapy-G-CSF added with cycle 5 FOLFIRINOX 6.  Oxaliplatin neuropathy-oxaliplatin held 09/16/2020, 10/14/2020, 10/28/2020, 11/11/2020, 11/25/2020    Disposition: Kirsten [name] appears stable.  She continues to tolerate the chemotherapy well.  She has oxaliplatin neuropathy.  Hopefully the symptoms will improve over the next few months.  She will complete another cycle of FOLFIRI today.  The CA 19-9 was lower when she was here 2 weeks ago.  Kirsten [name] will return for an office visit and chemotherapy in 2 weeks.  I encouraged her to obtain the COVID-19 and influenza vaccines.  [name], MD  12/23/2020  9:50  AM

## 2020-12-24 LAB — CANCER ANTIGEN 19-9: CA 19-9: 4535 U/mL — ABNORMAL HIGH (ref 0–35)

## 2020-12-25 ENCOUNTER — Inpatient Hospital Stay: Payer: Medicaid Other

## 2020-12-25 ENCOUNTER — Other Ambulatory Visit: Payer: Self-pay

## 2020-12-25 VITALS — BP 146/89 | HR 94 | Temp 98.4°F | Resp 18

## 2020-12-25 DIAGNOSIS — Z95828 Presence of other vascular implants and grafts: Secondary | ICD-10-CM

## 2020-12-25 DIAGNOSIS — Z5111 Encounter for antineoplastic chemotherapy: Secondary | ICD-10-CM | POA: Diagnosis not present

## 2020-12-25 DIAGNOSIS — C252 Malignant neoplasm of tail of pancreas: Secondary | ICD-10-CM

## 2020-12-25 MED ORDER — SODIUM CHLORIDE 0.9% FLUSH: 10.0000 mL | INTRAVENOUS | Status: DC | PRN

## 2020-12-25 MED ORDER — HEPARIN SOD (PORK) LOCK FLUSH 100 UNIT/ML IV SOLN: 500.0000 [IU] | Freq: Once | INTRAVENOUS | Status: DC | PRN

## 2020-12-25 MED ORDER — HEPARIN SOD (PORK) LOCK FLUSH 100 UNIT/ML IV SOLN
500.0000 [IU] | Freq: Once | INTRAVENOUS | Status: AC
  Administered 2020-12-25: 500 [IU] via INTRAVENOUS

## 2020-12-25 MED ORDER — SODIUM CHLORIDE 0.9% FLUSH
10.0000 mL | Freq: Once | INTRAVENOUS | Status: AC
  Administered 2020-12-25: 10 mL via INTRAVENOUS

## 2020-12-25 MED ORDER — PEGFILGRASTIM-BMEZ 6 MG/0.6ML ~~LOC~~ SOSY
6.0000 mg | PREFILLED_SYRINGE | Freq: Once | SUBCUTANEOUS | Status: AC
  Administered 2020-12-25: 6 mg via SUBCUTANEOUS

## 2021-01-05 ENCOUNTER — Other Ambulatory Visit: Payer: Self-pay | Admitting: Oncology

## 2021-01-06 ENCOUNTER — Other Ambulatory Visit: Payer: Self-pay

## 2021-01-06 ENCOUNTER — Inpatient Hospital Stay (HOSPITAL_BASED_OUTPATIENT_CLINIC_OR_DEPARTMENT_OTHER): Payer: Medicaid Other | Admitting: Nurse Practitioner

## 2021-01-06 ENCOUNTER — Inpatient Hospital Stay: Payer: Medicaid Other | Attending: Oncology

## 2021-01-06 ENCOUNTER — Encounter: Payer: Self-pay | Admitting: Nurse Practitioner

## 2021-01-06 ENCOUNTER — Inpatient Hospital Stay: Payer: Medicaid Other

## 2021-01-06 VITALS — BP 155/82 | HR 62 | Temp 97.7°F | Resp 18 | Ht 63.0 in | Wt 132.4 lb

## 2021-01-06 DIAGNOSIS — Z5111 Encounter for antineoplastic chemotherapy: Secondary | ICD-10-CM | POA: Diagnosis present

## 2021-01-06 DIAGNOSIS — Z23 Encounter for immunization: Secondary | ICD-10-CM | POA: Diagnosis not present

## 2021-01-06 DIAGNOSIS — D701 Agranulocytosis secondary to cancer chemotherapy: Secondary | ICD-10-CM | POA: Diagnosis not present

## 2021-01-06 DIAGNOSIS — C252 Malignant neoplasm of tail of pancreas: Secondary | ICD-10-CM | POA: Insufficient documentation

## 2021-01-06 DIAGNOSIS — C787 Secondary malignant neoplasm of liver and intrahepatic bile duct: Secondary | ICD-10-CM | POA: Diagnosis not present

## 2021-01-06 DIAGNOSIS — T451X5A Adverse effect of antineoplastic and immunosuppressive drugs, initial encounter: Secondary | ICD-10-CM | POA: Diagnosis not present

## 2021-01-06 LAB — CBC WITH DIFFERENTIAL (CANCER CENTER ONLY)
Abs Immature Granulocytes: 0.18 10*3/uL — ABNORMAL HIGH (ref 0.00–0.07)
Basophils Absolute: 0.1 10*3/uL (ref 0.0–0.1)
Basophils Relative: 1 %
Eosinophils Absolute: 0.2 10*3/uL (ref 0.0–0.5)
Eosinophils Relative: 2 %
HCT: 33.9 % — ABNORMAL LOW (ref 36.0–46.0)
Hemoglobin: 10.9 g/dL — ABNORMAL LOW (ref 12.0–15.0)
Immature Granulocytes: 2 %
Lymphocytes Relative: 31 %
Lymphs Abs: 2.4 10*3/uL (ref 0.7–4.0)
MCH: 31.8 pg (ref 26.0–34.0)
MCHC: 32.2 g/dL (ref 30.0–36.0)
MCV: 98.8 fL (ref 80.0–100.0)
Monocytes Absolute: 1.1 10*3/uL — ABNORMAL HIGH (ref 0.1–1.0)
Monocytes Relative: 14 %
Neutro Abs: 4 10*3/uL (ref 1.7–7.7)
Neutrophils Relative %: 50 %
Platelet Count: 218 10*3/uL (ref 150–400)
RBC: 3.43 MIL/uL — ABNORMAL LOW (ref 3.87–5.11)
RDW: 15.4 % (ref 11.5–15.5)
WBC Count: 7.9 10*3/uL (ref 4.0–10.5)
nRBC: 0.3 % — ABNORMAL HIGH (ref 0.0–0.2)

## 2021-01-06 LAB — MAGNESIUM: Magnesium: 2 mg/dL (ref 1.7–2.4)

## 2021-01-06 LAB — CMP (CANCER CENTER ONLY)
ALT: 26 U/L (ref 0–44)
AST: 37 U/L (ref 15–41)
Albumin: 3.4 g/dL — ABNORMAL LOW (ref 3.5–5.0)
Alkaline Phosphatase: 227 U/L — ABNORMAL HIGH (ref 38–126)
Anion gap: 6 (ref 5–15)
BUN: 9 mg/dL (ref 8–23)
CO2: 28 mmol/L (ref 22–32)
Calcium: 9 mg/dL (ref 8.9–10.3)
Chloride: 107 mmol/L (ref 98–111)
Creatinine: 0.95 mg/dL (ref 0.44–1.00)
GFR, Estimated: >60 mL/min (ref >60)
Glucose, Bld: 101 mg/dL — ABNORMAL HIGH (ref 70–99)
Potassium: 4.1 mmol/L (ref 3.5–5.1)
Sodium: 141 mmol/L (ref 135–145)
Total Bilirubin: 0.5 mg/dL (ref 0.3–1.2)
Total Protein: 6.6 g/dL (ref 6.5–8.1)

## 2021-01-06 MED ORDER — SODIUM CHLORIDE 0.9 % IV SOLN
2400.0000 mg/m2 | INTRAVENOUS | Status: DC
  Administered 2021-01-06: 3850 mg via INTRAVENOUS
  Filled 2021-01-06: qty 77

## 2021-01-06 MED ORDER — SODIUM CHLORIDE 0.9 % IV SOLN: Freq: Once | INTRAVENOUS | Status: AC

## 2021-01-06 MED ORDER — SODIUM CHLORIDE 0.9 % IV SOLN
10.0000 mg | Freq: Once | INTRAVENOUS | Status: AC
  Administered 2021-01-06: 10 mg via INTRAVENOUS
  Filled 2021-01-06: qty 1

## 2021-01-06 MED ORDER — SODIUM CHLORIDE 0.9% FLUSH
10.0000 mL | INTRAVENOUS | Status: DC | PRN
  Administered 2021-01-06: 10 mL

## 2021-01-06 MED ORDER — SODIUM CHLORIDE 0.9 % IV SOLN
150.0000 mg | Freq: Once | INTRAVENOUS | Status: AC
  Administered 2021-01-06: 150 mg via INTRAVENOUS
  Filled 2021-01-06: qty 5

## 2021-01-06 MED ORDER — ATROPINE SULFATE 1 MG/ML IV SOLN
0.5000 mg | Freq: Once | INTRAVENOUS | Status: AC | PRN
  Administered 2021-01-06: 0.5 mg via INTRAVENOUS
  Filled 2021-01-06: qty 1

## 2021-01-06 MED ORDER — SODIUM CHLORIDE 0.9 % IV SOLN
400.0000 mg/m2 | Freq: Once | INTRAVENOUS | Status: AC
  Administered 2021-01-06: 644 mg via INTRAVENOUS
  Filled 2021-01-06: qty 32.2

## 2021-01-06 MED ORDER — SODIUM CHLORIDE 0.9 % IV SOLN
125.0000 mg/m2 | Freq: Once | INTRAVENOUS | Status: AC
  Administered 2021-01-06: 200 mg via INTRAVENOUS
  Filled 2021-01-06: qty 10

## 2021-01-06 MED ORDER — PALONOSETRON HCL INJECTION 0.25 MG/5ML
0.2500 mg | Freq: Once | INTRAVENOUS | Status: AC
  Administered 2021-01-06: 0.25 mg via INTRAVENOUS
  Filled 2021-01-06: qty 5

## 2021-01-06 NOTE — Progress Notes (Signed)
  [facility] OFFICE PROGRESS NOTE   Diagnosis: Pancreas cancer  INTERVAL HISTORY:   Kirsten [name] returns as scheduled.  She completed another cycle of FOLFIRI 12/23/2020.  She denies nausea/vomiting.  No mouth sores.  No diarrhea.  She reports persistent neuropathy symptoms in the hands and now experiencing in the feet.  She denies pain  Objective:  Vital signs in last 24 hours:  Blood pressure (!) 155/82, pulse 62, temperature 97.7 F (36.5 C), temperature source Oral, resp. rate 18, height 5\' 3"  (1.6 m), weight 132 lb 6.4 oz (60.1 kg), SpO2 100 %.    HEENT: No thrush or ulcers. Resp: Lungs clear bilaterally. Cardio: Regular rate and rhythm. GI: Abdomen soft and nontender.  No hepatosplenomegaly.  No mass. Vascular: No leg edema. Port-A-Cath without erythema.  Lab Results:  Lab Results  Component Value Date   WBC 7.9 01/06/2021   HGB 10.9 (L) 01/06/2021   HCT 33.9 (L) 01/06/2021   MCV 98.8 01/06/2021   PLT 218 01/06/2021   NEUTROABS 4.0 01/06/2021    Imaging:  No results found.  Medications: I have reviewed the patient's current medications.  Assessment/Plan: Metastatic pancreas cancer Right upper quadrant ultrasound 06/17/2020-multiple hypoechoic liver lesions CT abdomen/pelvis 06/17/2020- pancreas tail mass, multiple liver metastases, enlarged periaortic nodes, right ovary metastasis?,  Nodularity at the left paracolic gutter and posterior/inferior stomach suggesting peritoneal tumor spread CT chest 06/17/2020- scattered small pulmonary nodules consistent with metastatic disease Ultrasound-guided biopsy of a left liver mass 06/18/2020- adenocarcinoma, morphology compatible with metastatic pancreatic adenocarcinoma Cycle 1 FOLFOX 07/01/2020 Cycle 2 FOLFOX 07/16/2020 Cycle 3 FOLFOX 07/31/2020 Cycle 4 FOLFIRINOX 08/13/2020 Cycle 5 FOLFIRINOX 09/03/2020, Udenyca CTs 09/13/2020-unchanged nodule medial left lower lobe; stable nodal and likely peritoneal disease in  the abdomen/pelvis; unchanged pancreatic tail mass; multifocal hepatic metastases, mildly progressive Cycle 6 FOLFIRINOX 09/16/2020, Oxaliplatin held Cycle 7 FOLFIRINOX 09/30/2020 Cycle 8 FOLFIRINOX 10/14/2020, oxaliplatin held Cycle 9 FOLFIRINOX 10/28/2020, oxaliplatin held Cycle 10 FOLFIRINOX 11/11/2020, oxaliplatin held Cycle 11 FOLFIRINOX 11/25/2020, oxaliplatin held CTs 12/06/2020-primary pancreatic tail tumor decreased in size.  Widespread liver metastases all decreased.  Previously visualized peritoneal nodule adjacent to the distal stomach resolved.  Left lower lobe pulmonary nodule stable.  No new or progressive metastatic disease. Cycle 12 FOLFIRI 12/09/2020 Cycle 13 FOLFIRI 12/23/2020 Cycle 14 FOLFIRI 01/06/2021 2.  Pain secondary to #1-improved 3.  Weight loss-improved 4.  Family history of pancreas cancer 5.  Neutropenia secondary chemotherapy-G-CSF added with cycle 5 FOLFIRINOX 6.  Oxaliplatin neuropathy-oxaliplatin held 09/16/2020, 10/14/2020, 10/28/2020, 11/11/2020, 11/25/2020    Disposition: Kirsten [name] appears stable.  She completed another cycle of FOLFIRI 2 weeks ago.  There is no clinical evidence of disease progression.  CA 19-9 from 12/23/2020 was further improved.  Plan to proceed with FOLFIRI today as scheduled.  Oxaliplatin remains on hold due to persistent neuropathy symptoms.  CBC and chemistry panel reviewed.  Labs adequate to proceed as above.  She will return for lab, follow-up, FOLFIRI in 2 weeks.    Ned Card ANP/GNP-BC   01/06/2021  8:42 AM

## 2021-01-06 NOTE — Progress Notes (Signed)
Patient presents for treatment. RN assessment completed along with the following:  Labs/vitals reviewed - Yes, and within treatment parameters.   Weight within 10% of previous measurement - Yes Oncology Treatment Attestation completed for current therapy- Yes, on date 06/27/20 Informed consent completed and reflects current therapy/intent - Yes, on date 07/01/20             Provider progress note reviewed - Today's provider note is not yet available. I reviewed the most recent oncology provider progress note in chart dated 12/23/20. Treatment/Antibody/Supportive plan reviewed - Yes, and there are no adjustments needed for today's treatment. S&H and other orders reviewed - Yes, and there are no additional orders identified. Previous treatment date reviewed - Yes, and the appropriate amount of time has elapsed between treatments. Clinic Hand Off Received from - Ned Card, NP  Patient to proceed with treatment.

## 2021-01-06 NOTE — Patient Instructions (Signed)

## 2021-01-06 NOTE — Patient Instructions (Addendum)
[facility]  Discharge Instructions: Thank you for choosing [facility] to provide your oncology and hematology care.   If you have a lab appointment with the [facility], please go directly to the [facility] and check in at the registration area.   Wear comfortable clothing and clothing appropriate for easy access to any Portacath or PICC line.   We strive to give you quality time with your provider. You may need to reschedule your appointment if you arrive late (15 or more minutes).  Arriving late affects you and other patients whose appointments are after yours.  Also, if you miss three or more appointments without notifying the office, you may be dismissed from the clinic at the provider's discretion.      For prescription refill requests, have your pharmacy contact our office and allow 72 hours for refills to be completed.    Today you received the following chemotherapy and/or immunotherapy agents irinotecan, leucovorin, fluorouracil      To help prevent nausea and vomiting after your treatment, we encourage you to take your nausea medication as directed.  BELOW ARE SYMPTOMS THAT SHOULD BE REPORTED IMMEDIATELY: *FEVER GREATER THAN 100.4 F (38 C) OR HIGHER *CHILLS OR SWEATING *NAUSEA AND VOMITING THAT IS NOT CONTROLLED WITH YOUR NAUSEA MEDICATION *UNUSUAL SHORTNESS OF BREATH *UNUSUAL BRUISING OR BLEEDING *URINARY PROBLEMS (pain or burning when urinating, or frequent urination) *BOWEL PROBLEMS (unusual diarrhea, constipation, pain near the anus) TENDERNESS IN MOUTH AND THROAT WITH OR WITHOUT PRESENCE OF ULCERS (sore throat, sores in mouth, or a toothache) UNUSUAL RASH, SWELLING OR PAIN  UNUSUAL VAGINAL DISCHARGE OR ITCHING   Items with * indicate a potential emergency and should be followed up as soon as possible or go to the Emergency Department if any problems should occur.  Please show the CHEMOTHERAPY ALERT CARD or IMMUNOTHERAPY  ALERT CARD at check-in to the Emergency Department and triage nurse.  Should you have questions after your visit or need to cancel or reschedule your appointment, please contact [facility]  Dept: [phone]  and follow the prompts.  Office hours are 8:00 a.m. to 4:30 p.m. Monday - Friday. Please note that voicemails left after 4:00 p.m. may not be returned until the following business day.  We are closed weekends and major holidays. You have access to a nurse at all times for urgent questions. Please call the main number to the clinic Dept: [phone] and follow the prompts.   For any non-urgent questions, you may also contact your provider using MyChart. We now offer e-Visits for anyone 14 and older to request care online for non-urgent symptoms. For details visit [email or web address].   Also download the MyChart app! Go to the app store, search "MyChart", open the app, select [facility], and log in with your MyChart username and password.  Due to Covid, a mask is required upon entering the hospital/clinic. If you do not have a mask, one will be given to you upon arrival. For doctor visits, patients may have 1 support person aged 24 or older with them. For treatment visits, patients cannot have anyone with them due to current Covid guidelines and our immunocompromised population.   Irinotecan injection What is this medication? IRINOTECAN (ir in oh TEE kan ) is a chemotherapy drug. It is used to treat colon and rectal cancer. This medicine may be used for other purposes; ask your health care provider or pharmacist if you have questions. COMMON BRAND  NAME(S): Camptosar What should I tell my care team before I take this medication? They need to know if you have any of these conditions: dehydration diarrhea infection (especially a virus infection such as chickenpox, cold sores, or herpes) liver disease low blood counts, like low white cell, platelet, or red cell  counts low levels of calcium, magnesium, or potassium in the blood recent or ongoing radiation therapy an unusual or allergic reaction to irinotecan, other medicines, foods, dyes, or preservatives pregnant or trying to get pregnant breast-feeding How should I use this medication? This drug is given as an infusion into a vein. It is administered in a hospital or clinic by a specially trained health care professional. Talk to your pediatrician regarding the use of this medicine in children. Special care may be needed. Overdosage: If you think you have taken too much of this medicine contact a poison control center or emergency room at once. NOTE: This medicine is only for you. Do not share this medicine with others. What if I miss a dose? It is important not to miss your dose. Call your doctor or health care professional if you are unable to keep an appointment. What may interact with this medication? Do not take this medicine with any of the following medications: cobicistat itraconazole This medicine may interact with the following medications: antiviral medicines for HIV or AIDS certain antibiotics like rifampin or rifabutin certain medicines for fungal infections like ketoconazole, posaconazole, and voriconazole certain medicines for seizures like carbamazepine, phenobarbital, phenotoin clarithromycin gemfibrozil nefazodone [facility]. John's Wort This list may not describe all possible interactions. Give your health care provider a list of all the medicines, herbs, non-prescription drugs, or dietary supplements you use. Also tell them if you smoke, drink alcohol, or use illegal drugs. Some items may interact with your medicine. What should I watch for while using this medication? Your condition will be monitored carefully while you are receiving this medicine. You will need important blood work done while you are taking this medicine. This drug may make you feel generally unwell. This is not  uncommon, as chemotherapy can affect healthy cells as well as cancer cells. Report any side effects. Continue your course of treatment even though you feel ill unless your doctor tells you to stop. In some cases, you may be given additional medicines to help with side effects. Follow all directions for their use. You may get drowsy or dizzy. Do not drive, use machinery, or do anything that needs mental alertness until you know how this medicine affects you. Do not stand or sit up quickly, especially if you are an older patient. This reduces the risk of dizzy or fainting spells. Call your health care professional for advice if you get a fever, chills, or sore throat, or other symptoms of a cold or flu. Do not treat yourself. This medicine decreases your body's ability to fight infections. Try to avoid being around people who are sick. Avoid taking products that contain aspirin, acetaminophen, ibuprofen, naproxen, or ketoprofen unless instructed by your doctor. These medicines may hide a fever. This medicine may increase your risk to bruise or bleed. Call your doctor or health care professional if you notice any unusual bleeding. Be careful brushing and flossing your teeth or using a toothpick because you may get an infection or bleed more easily. If you have any dental work done, tell your dentist you are receiving this medicine. Do not become pregnant while taking this medicine or for 6 months after stopping  it. Women should inform their health care professional if they wish to become pregnant or think they might be pregnant. Men should not father a child while taking this medicine and for 3 months after stopping it. There is potential for serious side effects to an unborn child. Talk to your health care professional for more information. Do not breast-feed an infant while taking this medicine or for 7 days after stopping it. This medicine has caused ovarian failure in some women. This medicine may make it  more difficult to get pregnant. Talk to your health care professional if you are concerned about your fertility. This medicine has caused decreased sperm counts in some men. This may make it more difficult to father a child. Talk to your health care professional if you are concerned about your fertility. What side effects may I notice from receiving this medication? Side effects that you should report to your doctor or health care professional as soon as possible: allergic reactions like skin rash, itching or hives, swelling of the face, lips, or tongue chest pain diarrhea flushing, runny nose, sweating during infusion low blood counts - this medicine may decrease the number of white blood cells, red blood cells and platelets. You may be at increased risk for infections and bleeding. nausea, vomiting pain, swelling, warmth in the leg signs of decreased platelets or bleeding - bruising, pinpoint red spots on the skin, black, tarry stools, blood in the urine signs of infection - fever or chills, cough, sore throat, pain or difficulty passing urine signs of decreased red blood cells - unusually weak or tired, fainting spells, lightheadedness Side effects that usually do not require medical attention (report to your doctor or health care professional if they continue or are bothersome): constipation hair loss headache loss of appetite mouth sores stomach pain This list may not describe all possible side effects. Call your doctor for medical advice about side effects. You may report side effects to FDA at 1-800-FDA-1088. Where should I keep my medication? This drug is given in a hospital or clinic and will not be stored at home. NOTE: This sheet is a summary. It may not cover all possible information. If you have questions about this medicine, talk to your doctor, pharmacist, or health care provider.  2022 Elsevier/Gold Standard (2019-01-17 17:46:13)  Leucovorin injection What is this  medication? LEUCOVORIN (loo koe VOR in) is used to prevent or treat the harmful effects of some medicines. This medicine is used to treat anemia caused by a low amount of folic acid in the body. It is also used with 5-fluorouracil (5-FU) to treat colon cancer. This medicine may be used for other purposes; ask your health care provider or pharmacist if you have questions. What should I tell my care team before I take this medication? They need to know if you have any of these conditions: anemia from low levels of vitamin B-12 in the blood an unusual or allergic reaction to leucovorin, folic acid, other medicines, foods, dyes, or preservatives pregnant or trying to get pregnant breast-feeding How should I use this medication? This medicine is for injection into a muscle or into a vein. It is given by a health care professional in a hospital or clinic setting. Talk to your pediatrician regarding the use of this medicine in children. Special care may be needed. Overdosage: If you think you have taken too much of this medicine contact a poison control center or emergency room at once. NOTE: This medicine is only for  you. Do not share this medicine with others. What if I miss a dose? This does not apply. What may interact with this medication? capecitabine fluorouracil phenobarbital phenytoin primidone trimethoprim-sulfamethoxazole This list may not describe all possible interactions. Give your health care provider a list of all the medicines, herbs, non-prescription drugs, or dietary supplements you use. Also tell them if you smoke, drink alcohol, or use illegal drugs. Some items may interact with your medicine. What should I watch for while using this medication? Your condition will be monitored carefully while you are receiving this medicine. This medicine may increase the side effects of 5-fluorouracil, 5-FU. Tell your doctor or health care professional if you have diarrhea or mouth sores  that do not get better or that get worse. What side effects may I notice from receiving this medication? Side effects that you should report to your doctor or health care professional as soon as possible: allergic reactions like skin rash, itching or hives, swelling of the face, lips, or tongue breathing problems fever, infection mouth sores unusual bleeding or bruising unusually weak or tired Side effects that usually do not require medical attention (report to your doctor or health care professional if they continue or are bothersome): constipation or diarrhea loss of appetite nausea, vomiting This list may not describe all possible side effects. Call your doctor for medical advice about side effects. You may report side effects to FDA at 1-800-FDA-1088. Where should I keep my medication? This drug is given in a hospital or clinic and will not be stored at home. NOTE: This sheet is a summary. It may not cover all possible information. If you have questions about this medicine, talk to your doctor, pharmacist, or health care provider.  2022 Elsevier/Gold Standard (2007-08-23 16:50:29)  Fluorouracil, 5-FU injection What is this medication? FLUOROURACIL, 5-FU (flure oh YOOR a sil) is a chemotherapy drug. It slows the growth of cancer cells. This medicine is used to treat many types of cancer like breast cancer, colon or rectal cancer, pancreatic cancer, and stomach cancer. This medicine may be used for other purposes; ask your health care provider or pharmacist if you have questions. COMMON BRAND NAME(S): Adrucil What should I tell my care team before I take this medication? They need to know if you have any of these conditions: blood disorders dihydropyrimidine dehydrogenase (DPD) deficiency infection (especially a virus infection such as chickenpox, cold sores, or herpes) kidney disease liver disease malnourished, poor nutrition recent or ongoing radiation therapy an unusual or  allergic reaction to fluorouracil, other chemotherapy, other medicines, foods, dyes, or preservatives pregnant or trying to get pregnant breast-feeding How should I use this medication? This drug is given as an infusion or injection into a vein. It is administered in a hospital or clinic by a specially trained health care professional. Talk to your pediatrician regarding the use of this medicine in children. Special care may be needed. Overdosage: If you think you have taken too much of this medicine contact a poison control center or emergency room at once. NOTE: This medicine is only for you. Do not share this medicine with others. What if I miss a dose? It is important not to miss your dose. Call your doctor or health care professional if you are unable to keep an appointment. What may interact with this medication? Do not take this medicine with any of the following medications: live virus vaccines This medicine may also interact with the following medications: medicines that treat or prevent blood clots  like warfarin, enoxaparin, and dalteparin This list may not describe all possible interactions. Give your health care provider a list of all the medicines, herbs, non-prescription drugs, or dietary supplements you use. Also tell them if you smoke, drink alcohol, or use illegal drugs. Some items may interact with your medicine. What should I watch for while using this medication? Visit your doctor for checks on your progress. This drug may make you feel generally unwell. This is not uncommon, as chemotherapy can affect healthy cells as well as cancer cells. Report any side effects. Continue your course of treatment even though you feel ill unless your doctor tells you to stop. In some cases, you may be given additional medicines to help with side effects. Follow all directions for their use. Call your doctor or health care professional for advice if you get a fever, chills or sore throat, or  other symptoms of a cold or flu. Do not treat yourself. This drug decreases your body's ability to fight infections. Try to avoid being around people who are sick. This medicine may increase your risk to bruise or bleed. Call your doctor or health care professional if you notice any unusual bleeding. Be careful brushing and flossing your teeth or using a toothpick because you may get an infection or bleed more easily. If you have any dental work done, tell your dentist you are receiving this medicine. Avoid taking products that contain aspirin, acetaminophen, ibuprofen, naproxen, or ketoprofen unless instructed by your doctor. These medicines may hide a fever. Do not become pregnant while taking this medicine. Women should inform their doctor if they wish to become pregnant or think they might be pregnant. There is a potential for serious side effects to an unborn child. Talk to your health care professional or pharmacist for more information. Do not breast-feed an infant while taking this medicine. Men should inform their doctor if they wish to father a child. This medicine may lower sperm counts. Do not treat diarrhea with over the counter products. Contact your doctor if you have diarrhea that lasts more than 2 days or if it is severe and watery. This medicine can make you more sensitive to the sun. Keep out of the sun. If you cannot avoid being in the sun, wear protective clothing and use sunscreen. Do not use sun lamps or tanning beds/booths. What side effects may I notice from receiving this medication? Side effects that you should report to your doctor or health care professional as soon as possible: allergic reactions like skin rash, itching or hives, swelling of the face, lips, or tongue low blood counts - this medicine may decrease the number of white blood cells, red blood cells and platelets. You may be at increased risk for infections and bleeding. signs of infection - fever or chills,  cough, sore throat, pain or difficulty passing urine signs of decreased platelets or bleeding - bruising, pinpoint red spots on the skin, black, tarry stools, blood in the urine signs of decreased red blood cells - unusually weak or tired, fainting spells, lightheadedness breathing problems changes in vision chest pain mouth sores nausea and vomiting pain, swelling, redness at site where injected pain, tingling, numbness in the hands or feet redness, swelling, or sores on hands or feet stomach pain unusual bleeding Side effects that usually do not require medical attention (report to your doctor or health care professional if they continue or are bothersome): changes in finger or toe nails diarrhea dry or itchy skin hair loss  headache loss of appetite sensitivity of eyes to the light stomach upset unusually teary eyes This list may not describe all possible side effects. Call your doctor for medical advice about side effects. You may report side effects to FDA at 1-800-FDA-1088. Where should I keep my medication? This drug is given in a hospital or clinic and will not be stored at home. NOTE: This sheet is a summary. It may not cover all possible information. If you have questions about this medicine, talk to your doctor, pharmacist, or health care provider.  2022 Elsevier/Gold Standard (2019-01-17 15:00:03)  The chemotherapy medication bag should finish at 46 hours, 96 hours, or 7 days. For example, if your pump is scheduled for 46 hours and it was put on at 4:00 p.m., it should finish at 2:00 p.m. the day it is scheduled to come off regardless of your appointment time.     Estimated time to finish at 1015am, Wednesday November 9th.   If the display on your pump reads "Low Volume" and it is beeping, take the batteries out of the pump and come to the cancer center for it to be taken off.   If the pump alarms go off prior to the pump reading "Low Volume" then call [phone]  and someone can assist you.  If the plunger comes out and the chemotherapy medication is leaking out, please use your home chemo spill kit to clean up the spill. Do NOT use paper towels or other household products.  If you have problems or questions regarding your pump, please call either [phone] (24 hours a day) or the cancer center Monday-Friday 8:00 a.m.- 4:30 p.m. at the clinic number and we will assist you. If you are unable to get assistance, then go to the nearest Emergency Department and ask the staff to contact the IV team for assistance.

## 2021-01-07 LAB — CANCER ANTIGEN 19-9: CA 19-9: 3042 U/mL — ABNORMAL HIGH (ref 0–35)

## 2021-01-08 ENCOUNTER — Other Ambulatory Visit (HOSPITAL_BASED_OUTPATIENT_CLINIC_OR_DEPARTMENT_OTHER): Payer: Self-pay

## 2021-01-08 ENCOUNTER — Other Ambulatory Visit: Payer: Self-pay

## 2021-01-08 ENCOUNTER — Encounter: Payer: Self-pay | Admitting: Oncology

## 2021-01-08 ENCOUNTER — Inpatient Hospital Stay: Payer: Medicaid Other

## 2021-01-08 VITALS — BP 145/79 | HR 89 | Temp 98.4°F | Resp 18

## 2021-01-08 DIAGNOSIS — C252 Malignant neoplasm of tail of pancreas: Secondary | ICD-10-CM

## 2021-01-08 DIAGNOSIS — Z5111 Encounter for antineoplastic chemotherapy: Secondary | ICD-10-CM | POA: Diagnosis not present

## 2021-01-08 MED ORDER — HEPARIN SOD (PORK) LOCK FLUSH 100 UNIT/ML IV SOLN
500.0000 [IU] | Freq: Once | INTRAVENOUS | Status: AC | PRN
  Administered 2021-01-08: 500 [IU]

## 2021-01-08 MED ORDER — PEGFILGRASTIM-BMEZ 6 MG/0.6ML ~~LOC~~ SOSY
6.0000 mg | PREFILLED_SYRINGE | Freq: Once | SUBCUTANEOUS | Status: AC
  Administered 2021-01-08: 6 mg via SUBCUTANEOUS

## 2021-01-08 MED ORDER — SODIUM CHLORIDE 0.9% FLUSH
10.0000 mL | INTRAVENOUS | Status: DC | PRN
  Administered 2021-01-08: 10 mL

## 2021-01-08 MED ORDER — INFLUENZA VAC SPLIT QUAD 0.5 ML IM SUSY
PREFILLED_SYRINGE | INTRAMUSCULAR | 0 refills | Status: DC
  Filled 2021-01-08: qty 0.5, 1d supply, fill #0

## 2021-01-08 NOTE — Patient Instructions (Signed)

## 2021-01-19 ENCOUNTER — Other Ambulatory Visit: Payer: Self-pay | Admitting: Oncology

## 2021-01-20 ENCOUNTER — Inpatient Hospital Stay: Payer: Medicaid Other

## 2021-01-20 ENCOUNTER — Inpatient Hospital Stay (HOSPITAL_BASED_OUTPATIENT_CLINIC_OR_DEPARTMENT_OTHER): Payer: Medicaid Other | Admitting: Oncology

## 2021-01-20 ENCOUNTER — Other Ambulatory Visit: Payer: Self-pay

## 2021-01-20 ENCOUNTER — Telehealth: Payer: Self-pay

## 2021-01-20 VITALS — BP 138/79 | HR 89

## 2021-01-20 VITALS — BP 163/91 | HR 100 | Temp 98.0°F | Resp 19 | Ht 63.0 in | Wt 132.0 lb

## 2021-01-20 DIAGNOSIS — C252 Malignant neoplasm of tail of pancreas: Secondary | ICD-10-CM | POA: Diagnosis not present

## 2021-01-20 DIAGNOSIS — Z5111 Encounter for antineoplastic chemotherapy: Secondary | ICD-10-CM | POA: Diagnosis not present

## 2021-01-20 LAB — CBC WITH DIFFERENTIAL (CANCER CENTER ONLY)
Abs Immature Granulocytes: 0.06 10*3/uL (ref 0.00–0.07)
Basophils Absolute: 0.1 10*3/uL (ref 0.0–0.1)
Basophils Relative: 1 %
Eosinophils Absolute: 0.2 10*3/uL (ref 0.0–0.5)
Eosinophils Relative: 3 %
HCT: 33.3 % — ABNORMAL LOW (ref 36.0–46.0)
Hemoglobin: 10.6 g/dL — ABNORMAL LOW (ref 12.0–15.0)
Immature Granulocytes: 1 %
Lymphocytes Relative: 35 %
Lymphs Abs: 3.2 10*3/uL (ref 0.7–4.0)
MCH: 31 pg (ref 26.0–34.0)
MCHC: 31.8 g/dL (ref 30.0–36.0)
MCV: 97.4 fL (ref 80.0–100.0)
Monocytes Absolute: 1.1 10*3/uL — ABNORMAL HIGH (ref 0.1–1.0)
Monocytes Relative: 11 %
Neutro Abs: 4.7 10*3/uL (ref 1.7–7.7)
Neutrophils Relative %: 49 %
Platelet Count: 261 10*3/uL (ref 150–400)
RBC: 3.42 MIL/uL — ABNORMAL LOW (ref 3.87–5.11)
RDW: 15.9 % — ABNORMAL HIGH (ref 11.5–15.5)
WBC Count: 9.3 10*3/uL (ref 4.0–10.5)
nRBC: 0 % (ref 0.0–0.2)

## 2021-01-20 LAB — CMP (CANCER CENTER ONLY)
ALT: 38 U/L (ref 0–44)
AST: 38 U/L (ref 15–41)
Albumin: 3.8 g/dL (ref 3.5–5.0)
Alkaline Phosphatase: 253 U/L — ABNORMAL HIGH (ref 38–126)
Anion gap: 9 (ref 5–15)
BUN: 8 mg/dL (ref 8–23)
CO2: 27 mmol/L (ref 22–32)
Calcium: 10.5 mg/dL — ABNORMAL HIGH (ref 8.9–10.3)
Chloride: 107 mmol/L (ref 98–111)
Creatinine: 0.98 mg/dL (ref 0.44–1.00)
GFR, Estimated: >60 mL/min (ref >60)
Glucose, Bld: 131 mg/dL — ABNORMAL HIGH (ref 70–99)
Potassium: 4.1 mmol/L (ref 3.5–5.1)
Sodium: 143 mmol/L (ref 135–145)
Total Bilirubin: 0.4 mg/dL (ref 0.3–1.2)
Total Protein: 6.9 g/dL (ref 6.5–8.1)

## 2021-01-20 LAB — MAGNESIUM: Magnesium: 2 mg/dL (ref 1.7–2.4)

## 2021-01-20 MED ORDER — SODIUM CHLORIDE 0.9 % IV SOLN
2400.0000 mg/m2 | INTRAVENOUS | Status: DC
  Administered 2021-01-20: 3850 mg via INTRAVENOUS
  Filled 2021-01-20: qty 77

## 2021-01-20 MED ORDER — SODIUM CHLORIDE 0.9 % IV SOLN
400.0000 mg/m2 | Freq: Once | INTRAVENOUS | Status: AC
  Administered 2021-01-20: 644 mg via INTRAVENOUS
  Filled 2021-01-20: qty 32.2

## 2021-01-20 MED ORDER — PALONOSETRON HCL INJECTION 0.25 MG/5ML
0.2500 mg | Freq: Once | INTRAVENOUS | Status: AC
  Administered 2021-01-20: 0.25 mg via INTRAVENOUS
  Filled 2021-01-20: qty 5

## 2021-01-20 MED ORDER — SODIUM CHLORIDE 0.9 % IV SOLN
10.0000 mg | Freq: Once | INTRAVENOUS | Status: AC
  Administered 2021-01-20: 10 mg via INTRAVENOUS
  Filled 2021-01-20: qty 1

## 2021-01-20 MED ORDER — ATROPINE SULFATE 1 MG/ML IV SOLN
0.5000 mg | Freq: Once | INTRAVENOUS | Status: AC | PRN
  Administered 2021-01-20: 0.5 mg via INTRAVENOUS
  Filled 2021-01-20: qty 1

## 2021-01-20 MED ORDER — DEXTROSE 5 % IV SOLN: Freq: Once | INTRAVENOUS | Status: AC

## 2021-01-20 MED ORDER — SODIUM CHLORIDE 0.9 % IV SOLN
150.0000 mg | Freq: Once | INTRAVENOUS | Status: AC
  Administered 2021-01-20: 150 mg via INTRAVENOUS
  Filled 2021-01-20: qty 5

## 2021-01-20 MED ORDER — SODIUM CHLORIDE 0.9 % IV SOLN
125.0000 mg/m2 | Freq: Once | INTRAVENOUS | Status: AC
  Administered 2021-01-20: 200 mg via INTRAVENOUS
  Filled 2021-01-20: qty 10

## 2021-01-20 NOTE — Telephone Encounter (Signed)
Patient seen by Dr. Sherrill today ? ?Vitals are within treatment parameters. ? ?Labs reviewed by Dr. Sherrill and are within treatment parameters. ? ?Per physician team, patient is ready for treatment and there are NO modifications to the treatment plan.  ?

## 2021-01-20 NOTE — Progress Notes (Signed)
Patient presents for treatment. RN assessment completed along with the following:  Labs/vitals reviewed - Yes, and within treatment parameters.   Weight within 10% of previous measurement - Yes Oncology Treatment Attestation completed for current therapy- Yes, on date 06/27/20 Informed consent completed and reflects current therapy/intent - Yes, on date 07/01/20             Provider progress note reviewed - Yes, today's provider note was reviewed. Treatment/Antibody/Supportive plan reviewed - Yes, and there are no adjustments needed for today's treatment. S&H and other orders reviewed - Yes, and there are no additional orders identified. Flu shot at pumpstop Previous treatment date reviewed - Yes, and the appropriate amount of time has elapsed between treatments. Clinic Hand Off Received from - Danae Orleans, LPN  Patient to proceed with treatment.

## 2021-01-20 NOTE — Progress Notes (Signed)
[facility] OFFICE PROGRESS NOTE   Diagnosis: Pancreas cancer  INTERVAL HISTORY:   Kirsten [name] completed no cycle FOLFIRI on 01/07/2020.  No nausea/vomiting, mouth sores, or diarrhea.  She continues to have numbness in the extremities.  She reports bilateral lower leg swelling that improves with elevation.  No pain.  Objective:  Vital signs in last 24 hours:  Blood pressure (!) 163/91, pulse 100, temperature 98 F (36.7 C), temperature source Oral, resp. rate 19, height 5\' 3"  (1.6 m), weight 132 lb (59.9 kg), SpO2 100 %.    HEENT: No thrush or ulcers Resp: Lungs clear bilaterally Cardio: Regular rate and rhythm GI: No hepatosplenomegaly, no mass, nontender Vascular: No leg edema   Portacath/PICC-without erythema  Lab Results:  Lab Results  Component Value Date   WBC 9.3 01/20/2021   HGB 10.6 (L) 01/20/2021   HCT 33.3 (L) 01/20/2021   MCV 97.4 01/20/2021   PLT 261 01/20/2021   NEUTROABS 4.7 01/20/2021    CMP  Lab Results  Component Value Date   NA 141 01/06/2021   K 4.1 01/06/2021   CL 107 01/06/2021   CO2 28 01/06/2021   GLUCOSE 101 (H) 01/06/2021   BUN 9 01/06/2021   CREATININE 0.95 01/06/2021   CALCIUM 9.0 01/06/2021   PROT 6.6 01/06/2021   ALBUMIN 3.4 (L) 01/06/2021   AST 37 01/06/2021   ALT 26 01/06/2021   ALKPHOS 227 (H) 01/06/2021   BILITOT 0.5 01/06/2021   GFRNONAA >60 01/06/2021    Lab Results  Component Value Date   DJS970 3,042 (H) 01/06/2021     Medications: I have reviewed the patient's current medications.   Assessment/Plan: Metastatic pancreas cancer Right upper quadrant ultrasound 06/17/2020-multiple hypoechoic liver lesions CT abdomen/pelvis 06/17/2020- pancreas tail mass, multiple liver metastases, enlarged periaortic nodes, right ovary metastasis?,  Nodularity at the left paracolic gutter and posterior/inferior stomach suggesting peritoneal tumor spread CT chest 06/17/2020- scattered small pulmonary nodules  consistent with metastatic disease Ultrasound-guided biopsy of a left liver mass 06/18/2020- adenocarcinoma, morphology compatible with metastatic pancreatic adenocarcinoma Cycle 1 FOLFOX 07/01/2020 Cycle 2 FOLFOX 07/16/2020 Cycle 3 FOLFOX 07/31/2020 Cycle 4 FOLFIRINOX 08/13/2020 Cycle 5 FOLFIRINOX 09/03/2020, Udenyca CTs 09/13/2020-unchanged nodule medial left lower lobe; stable nodal and likely peritoneal disease in the abdomen/pelvis; unchanged pancreatic tail mass; multifocal hepatic metastases, mildly progressive Cycle 6 FOLFIRINOX 09/16/2020, Oxaliplatin held Cycle 7 FOLFIRINOX 09/30/2020 Cycle 8 FOLFIRINOX 10/14/2020, oxaliplatin held Cycle 9 FOLFIRINOX 10/28/2020, oxaliplatin held Cycle 10 FOLFIRINOX 11/11/2020, oxaliplatin held Cycle 11 FOLFIRINOX 11/25/2020, oxaliplatin held CTs 12/06/2020-primary pancreatic tail tumor decreased in size.  Widespread liver metastases all decreased.  Previously visualized peritoneal nodule adjacent to the distal stomach resolved.  Left lower lobe pulmonary nodule stable.  No new or progressive metastatic disease. Cycle 12 FOLFIRI 12/09/2020 Cycle 13 FOLFIRI 12/23/2020 Cycle 14 FOLFIRI 01/06/2021 Cycle 15 FOLFIRI 01/20/2021 2.  Pain secondary to #1-improved 3.  Weight loss-improved 4.  Family history of pancreas cancer 5.  Neutropenia secondary chemotherapy-G-CSF added with cycle 5 FOLFIRINOX 6.  Oxaliplatin neuropathy-oxaliplatin held 09/16/2020, 10/14/2020, 10/28/2020, 11/11/2020, 11/25/2020     Disposition: Kirsten [name] continues to tolerate the chemotherapy well.  She appears stable.  The CA 19-9 was lower when she was here 2 weeks ago.  We will follow-up on the CA 19-9 from today.  She will return for an office visit and chemotherapy in 2 weeks. Kirsten [name] will receive an influenza vaccine when she is here for the pump disconnect on 01/22/2021.  [name], MD  01/20/2021  9:21 AM

## 2021-01-20 NOTE — Patient Instructions (Addendum)
The chemotherapy medication bag should finish at 46 hours, 96 hours, or 7 days. For example, if your pump is scheduled for 46 hours and it was put on at 4:00 p.m., it should finish at 2:00 p.m. the day it is scheduled to come off regardless of your appointment time.     Estimated time to finish at  11:00 Wednesday, November 23,2022.   If the display on your pump reads "Low Volume" and it is beeping, take the batteries out of the pump and come to the cancer center for it to be taken off.   If the pump alarms go off prior to the pump reading "Low Volume" then call [phone] and someone can assist you.  If the plunger comes out and the chemotherapy medication is leaking out, please use your home chemo spill kit to clean up the spill. Do NOT use paper towels or other household products.  If you have problems or questions regarding your pump, please call either [phone] (24 hours a day) or the cancer center Monday-Friday 8:00 a.m.- 4:30 p.m. at the clinic number and we will assist you. If you are unable to get assistance, then go to the nearest Emergency Department and ask the staff to contact the IV team for assistance.  [facility]   Discharge Instructions: Thank you for choosing [facility] to provide your oncology and hematology care.   If you have a lab appointment with the Cancer Center, please go directly to the Cancer Center and check in at the registration area.   Wear comfortable clothing and clothing appropriate for easy access to any Portacath or PICC line.   We strive to give you quality time with your provider. You may need to reschedule your appointment if you arrive late (15 or more minutes).  Arriving late affects you and other patients whose appointments are after yours.  Also, if you miss three or more appointments without notifying the office, you may be dismissed from the clinic at the provider's discretion.      For  prescription refill requests, have your pharmacy contact our office and allow 72 hours for refills to be completed.    Today you received the following chemotherapy and/or immunotherapy agents Irinotecan, leucovorin, fluorouracil      To help prevent nausea and vomiting after your treatment, we encourage you to take your nausea medication as directed.  BELOW ARE SYMPTOMS THAT SHOULD BE REPORTED IMMEDIATELY: *FEVER GREATER THAN 100.4 F (38 C) OR HIGHER *CHILLS OR SWEATING *NAUSEA AND VOMITING THAT IS NOT CONTROLLED WITH YOUR NAUSEA MEDICATION *UNUSUAL SHORTNESS OF BREATH *UNUSUAL BRUISING OR BLEEDING *URINARY PROBLEMS (pain or burning when urinating, or frequent urination) *BOWEL PROBLEMS (unusual diarrhea, constipation, pain near the anus) TENDERNESS IN MOUTH AND THROAT WITH OR WITHOUT PRESENCE OF ULCERS (sore throat, sores in mouth, or a toothache) UNUSUAL RASH, SWELLING OR PAIN  UNUSUAL VAGINAL DISCHARGE OR ITCHING   Items with * indicate a potential emergency and should be followed up as soon as possible or go to the Emergency Department if any problems should occur.  Please show the CHEMOTHERAPY ALERT CARD or IMMUNOTHERAPY ALERT CARD at check-in to the Emergency Department and triage nurse.  Should you have questions after your visit or need to cancel or reschedule your appointment, please contact [facility]  Dept: [phone]  and follow the prompts.  Office hours are 8:00 a.m. to 4:30 p.m. Monday - Friday. Please note that voicemails   left after 4:00 p.m. may not be returned until the following business day.  We are closed weekends and major holidays. You have access to a nurse at all times for urgent questions. Please call the main number to the clinic Dept: [phone] and follow the prompts.   For any non-urgent questions, you may also contact your provider using MyChart. We now offer e-Visits for anyone 85 and older to request care online for  non-urgent symptoms. For details visit [email or web address].   Also download the MyChart app! Go to the app store, search "MyChart", open the app, select [facility], and log in with your MyChart username and password.  Due to Covid, a mask is required upon entering the hospital/clinic. If you do not have a mask, one will be given to you upon arrival. For doctor visits, patients may have 1 support person aged 60 or older with them. For treatment visits, patients cannot have anyone with them due to current Covid guidelines and our immunocompromised population.   Irinotecan injection What is this medication? IRINOTECAN (ir in oh TEE kan ) is a chemotherapy drug. It is used to treat colon and rectal cancer. This medicine may be used for other purposes; ask your health care provider or pharmacist if you have questions. COMMON BRAND NAME(S): Camptosar What should I tell my care team before I take this medication? They need to know if you have any of these conditions: dehydration diarrhea infection (especially a virus infection such as chickenpox, cold sores, or herpes) liver disease low blood counts, like low white cell, platelet, or red cell counts low levels of calcium, magnesium, or potassium in the blood recent or ongoing radiation therapy an unusual or allergic reaction to irinotecan, other medicines, foods, dyes, or preservatives pregnant or trying to get pregnant breast-feeding How should I use this medication? This drug is given as an infusion into a vein. It is administered in a hospital or clinic by a specially trained health care professional. Talk to your pediatrician regarding the use of this medicine in children. Special care may be needed. Overdosage: If you think you have taken too much of this medicine contact a poison control center or emergency room at once. NOTE: This medicine is only for you. Do not share this medicine with others. What if I miss a dose? It is important  not to miss your dose. Call your doctor or health care professional if you are unable to keep an appointment. What may interact with this medication? Do not take this medicine with any of the following medications: cobicistat itraconazole This medicine may interact with the following medications: antiviral medicines for HIV or AIDS certain antibiotics like rifampin or rifabutin certain medicines for fungal infections like ketoconazole, posaconazole, and voriconazole certain medicines for seizures like carbamazepine, phenobarbital, phenotoin clarithromycin gemfibrozil nefazodone St. John's Wort This list may not describe all possible interactions. Give your health care provider a list of all the medicines, herbs, non-prescription drugs, or dietary supplements you use. Also tell them if you smoke, drink alcohol, or use illegal drugs. Some items may interact with your medicine. What should I watch for while using this medication? Your condition will be monitored carefully while you are receiving this medicine. You will need important blood work done while you are taking this medicine. This drug may make you feel generally unwell. This is not uncommon, as chemotherapy can affect healthy cells as well as cancer cells. Report any side effects. Continue your course of treatment even though you  feel ill unless your doctor tells you to stop. In some cases, you may be given additional medicines to help with side effects. Follow all directions for their use. You may get drowsy or dizzy. Do not drive, use machinery, or do anything that needs mental alertness until you know how this medicine affects you. Do not stand or sit up quickly, especially if you are an older patient. This reduces the risk of dizzy or fainting spells. Call your health care professional for advice if you get a fever, chills, or sore throat, or other symptoms of a cold or flu. Do not treat yourself. This medicine decreases your body's  ability to fight infections. Try to avoid being around people who are sick. Avoid taking products that contain aspirin, acetaminophen, ibuprofen, naproxen, or ketoprofen unless instructed by your doctor. These medicines may hide a fever. This medicine may increase your risk to bruise or bleed. Call your doctor or health care professional if you notice any unusual bleeding. Be careful brushing and flossing your teeth or using a toothpick because you may get an infection or bleed more easily. If you have any dental work done, tell your dentist you are receiving this medicine. Do not become pregnant while taking this medicine or for 6 months after stopping it. Women should inform their health care professional if they wish to become pregnant or think they might be pregnant. Men should not father a child while taking this medicine and for 3 months after stopping it. There is potential for serious side effects to an unborn child. Talk to your health care professional for more information. Do not breast-feed an infant while taking this medicine or for 7 days after stopping it. This medicine has caused ovarian failure in some women. This medicine may make it more difficult to get pregnant. Talk to your health care professional if you are concerned about your fertility. This medicine has caused decreased sperm counts in some men. This may make it more difficult to father a child. Talk to your health care professional if you are concerned about your fertility. What side effects may I notice from receiving this medication? Side effects that you should report to your doctor or health care professional as soon as possible: allergic reactions like skin rash, itching or hives, swelling of the face, lips, or tongue chest pain diarrhea flushing, runny nose, sweating during infusion low blood counts - this medicine may decrease the number of white blood cells, red blood cells and platelets. You may be at increased risk  for infections and bleeding. nausea, vomiting pain, swelling, warmth in the leg signs of decreased platelets or bleeding - bruising, pinpoint red spots on the skin, black, tarry stools, blood in the urine signs of infection - fever or chills, cough, sore throat, pain or difficulty passing urine signs of decreased red blood cells - unusually weak or tired, fainting spells, lightheadedness Side effects that usually do not require medical attention (report to your doctor or health care professional if they continue or are bothersome): constipation hair loss headache loss of appetite mouth sores stomach pain This list may not describe all possible side effects. Call your doctor for medical advice about side effects. You may report side effects to FDA at 1-800-FDA-1088. Where should I keep my medication? This drug is given in a hospital or clinic and will not be stored at home. NOT Leucovorin injection What is this medication? LEUCOVORIN (loo koe VOR in) is used to prevent or treat the harmful   effects of some medicines. This medicine is used to treat anemia caused by a low amount of folic acid in the body. It is also used with 5-fluorouracil (5-FU) to treat colon cancer. This medicine may be used for other purposes; ask your health care provider or pharmacist if you have questions. What should I tell my care team before I take this medication? They need to know if you have any of these conditions: anemia from low levels of vitamin B-12 in the blood an unusual or allergic reaction to leucovorin, folic acid, other medicines, foods, dyes, or preservatives pregnant or trying to get pregnant breast-feeding How should I use this medication? This medicine is for injection into a muscle or into a vein. It is given by a health care professional in a hospital or clinic setting. Talk to your pediatrician regarding the use of this medicine in children. Special care may be needed. Overdosage: If you  think you have taken too much of this medicine contact a poison control center or emergency room at once. NOTE: This medicine is only for you. Do not share this medicine with others. What if I miss a dose? This does not apply. What may interact with this medication? capecitabine fluorouracil phenobarbital phenytoin primidone trimethoprim-sulfamethoxazole This list may not describe all possible interactions. Give your health care provider a list of all the medicines, herbs, non-prescription drugs, or dietary supplements you use. Also tell them if you smoke, drink alcohol, or use illegal drugs. Some items may interact with your medicine. What should I watch for while using this medication? Your condition will be monitored carefully while you are receiving this medicine. This medicine may increase the side effects of 5-fluorouracil, 5-FU. Tell your doctor or health care professional if you have diarrhea or mouth sores that do not get better or that get worse. What side effects may I notice from receiving this medication? Side effects that you should report to your doctor or health care professional as soon as possible: allergic reactions like skin rash, itching or hives, swelling of the face, lips, or tongue breathing problems fever, infection mouth sores unusual bleeding or bruising unusually weak or tired Side effects that usually do not require medical attention (report to your doctor or health care professional if they continue or are bothersome): constipation or diarrhea loss of appetite nausea, vomiting This list may not describe all possible side effects. Call your doctor for medical advice about side effects. You may report side effects to FDA at 1-800-FDA-1088. Where should I keep my medication? This drug is given in a hospital or clinic and will not be stored at home. NOTE: This sheet is a summary. It may not cover all possible information. If you have questions about this  medicine, talk to your doctor, pharmacist, or health care provider.  2022 [employer]/Gold Standard (2007-08-25 00:00:00)  Fluorouracil, 5-FU injection What is this medication? FLUOROURACIL, 5-FU (flure oh YOOR a sil) is a chemotherapy drug. It slows the growth of cancer cells. This medicine is used to treat many types of cancer like breast cancer, colon or rectal cancer, pancreatic cancer, and stomach cancer. This medicine may be used for other purposes; ask your health care provider or pharmacist if you have questions. COMMON BRAND NAME(S): Adrucil What should I tell my care team before I take this medication? They need to know if you have any of these conditions: blood disorders dihydropyrimidine dehydrogenase (DPD) deficiency infection (especially a virus infection such as chickenpox, cold sores, or herpes) kidney   disease liver disease malnourished, poor nutrition recent or ongoing radiation therapy an unusual or allergic reaction to fluorouracil, other chemotherapy, other medicines, foods, dyes, or preservatives pregnant or trying to get pregnant breast-feeding How should I use this medication? This drug is given as an infusion or injection into a vein. It is administered in a hospital or clinic by a specially trained health care professional. Talk to your pediatrician regarding the use of this medicine in children. Special care may be needed. Overdosage: If you think you have taken too much of this medicine contact a poison control center or emergency room at once. NOTE: This medicine is only for you. Do not share this medicine with others. What if I miss a dose? It is important not to miss your dose. Call your doctor or health care professional if you are unable to keep an appointment. What may interact with this medication? Do not take this medicine with any of the following medications: live virus vaccines This medicine may also interact with the following medications: medicines  that treat or prevent blood clots like warfarin, enoxaparin, and dalteparin This list may not describe all possible interactions. Give your health care provider a list of all the medicines, herbs, non-prescription drugs, or dietary supplements you use. Also tell them if you smoke, drink alcohol, or use illegal drugs. Some items may interact with your medicine. What should I watch for while using this medication? Visit your doctor for checks on your progress. This drug may make you feel generally unwell. This is not uncommon, as chemotherapy can affect healthy cells as well as cancer cells. Report any side effects. Continue your course of treatment even though you feel ill unless your doctor tells you to stop. In some cases, you may be given additional medicines to help with side effects. Follow all directions for their use. Call your doctor or health care professional for advice if you get a fever, chills or sore throat, or other symptoms of a cold or flu. Do not treat yourself. This drug decreases your body's ability to fight infections. Try to avoid being around people who are sick. This medicine may increase your risk to bruise or bleed. Call your doctor or health care professional if you notice any unusual bleeding. Be careful brushing and flossing your teeth or using a toothpick because you may get an infection or bleed more easily. If you have any dental work done, tell your dentist you are receiving this medicine. Avoid taking products that contain aspirin, acetaminophen, ibuprofen, naproxen, or ketoprofen unless instructed by your doctor. These medicines may hide a fever. Do not become pregnant while taking this medicine. Women should inform their doctor if they wish to become pregnant or think they might be pregnant. There is a potential for serious side effects to an unborn child. Talk to your health care professional or pharmacist for more information. Do not breast-feed an infant while taking  this medicine. Men should inform their doctor if they wish to father a child. This medicine may lower sperm counts. Do not treat diarrhea with over the counter products. Contact your doctor if you have diarrhea that lasts more than 2 days or if it is severe and watery. This medicine can make you more sensitive to the sun. Keep out of the sun. If you cannot avoid being in the sun, wear protective clothing and use sunscreen. Do not use sun lamps or tanning beds/booths. What side effects may I notice from receiving this medication? Side effects   that you should report to your doctor or health care professional as soon as possible: allergic reactions like skin rash, itching or hives, swelling of the face, lips, or tongue low blood counts - this medicine may decrease the number of white blood cells, red blood cells and platelets. You may be at increased risk for infections and bleeding. signs of infection - fever or chills, cough, sore throat, pain or difficulty passing urine signs of decreased platelets or bleeding - bruising, pinpoint red spots on the skin, black, tarry stools, blood in the urine signs of decreased red blood cells - unusually weak or tired, fainting spells, lightheadedness breathing problems changes in vision chest pain mouth sores nausea and vomiting pain, swelling, redness at site where injected pain, tingling, numbness in the hands or feet redness, swelling, or sores on hands or feet stomach pain unusual bleeding Side effects that usually do not require medical attention (report to your doctor or health care professional if they continue or are bothersome): changes in finger or toe nails diarrhea dry or itchy skin hair loss headache loss of appetite sensitivity of eyes to the light stomach upset unusually teary eyes This list may not describe all possible side effects. Call your doctor for medical advice about side effects. You may report side effects to FDA at  1-800-FDA-1088. Where should I keep my medication? This drug is given in a hospital or clinic and will not be stored at home. NOTE: This sheet is a summary. It may not cover all possible information. If you have questions about this medicine, talk to your doctor, pharmacist, or health care provider.  2022 [employer]/Gold Standard (2020-11-05 00:00:00)

## 2021-01-21 LAB — CANCER ANTIGEN 19-9: CA 19-9: 2788 U/mL — ABNORMAL HIGH (ref 0–35)

## 2021-01-22 ENCOUNTER — Other Ambulatory Visit: Payer: Self-pay

## 2021-01-22 ENCOUNTER — Inpatient Hospital Stay: Payer: Medicaid Other

## 2021-01-22 VITALS — BP 156/101 | HR 88 | Temp 97.8°F | Resp 20

## 2021-01-22 DIAGNOSIS — C252 Malignant neoplasm of tail of pancreas: Secondary | ICD-10-CM

## 2021-01-22 DIAGNOSIS — Z5111 Encounter for antineoplastic chemotherapy: Secondary | ICD-10-CM | POA: Diagnosis not present

## 2021-01-22 DIAGNOSIS — Z0000 Encounter for general adult medical examination without abnormal findings: Secondary | ICD-10-CM

## 2021-01-22 MED ORDER — INFLUENZA VAC SPLIT QUAD 0.5 ML IM SUSY
0.5000 mL | PREFILLED_SYRINGE | Freq: Once | INTRAMUSCULAR | Status: AC
  Administered 2021-01-22: 0.5 mL via INTRAMUSCULAR
  Filled 2021-01-22: qty 0.5

## 2021-01-22 MED ORDER — HEPARIN SOD (PORK) LOCK FLUSH 100 UNIT/ML IV SOLN
500.0000 [IU] | Freq: Once | INTRAVENOUS | Status: AC | PRN
  Administered 2021-01-22: 500 [IU]

## 2021-01-22 MED ORDER — PEGFILGRASTIM-BMEZ 6 MG/0.6ML ~~LOC~~ SOSY
6.0000 mg | PREFILLED_SYRINGE | Freq: Once | SUBCUTANEOUS | Status: AC
  Administered 2021-01-22: 6 mg via SUBCUTANEOUS

## 2021-01-22 MED ORDER — SODIUM CHLORIDE 0.9% FLUSH
10.0000 mL | INTRAVENOUS | Status: DC | PRN
  Administered 2021-01-22: 10 mL

## 2021-01-22 NOTE — Patient Instructions (Addendum)
[facility]  Discharge Instructions: Thank you for choosing [facility] to provide your oncology and hematology care.   If you have a lab appointment with the [facility], please go directly to the [facility] and check in at the registration area.   Wear comfortable clothing and clothing appropriate for easy access to any Portacath or PICC line.   We strive to give you quality time with your provider. You may need to reschedule your appointment if you arrive late (15 or more minutes).  Arriving late affects you and other patients whose appointments are after yours.  Also, if you miss three or more appointments without notifying the office, you may be dismissed from the clinic at the provider's discretion.      For prescription refill requests, have your pharmacy contact our office and allow 72 hours for refills to be completed.    Today you received the following chemotherapy and/or immunotherapy agents: 31fu and Ziextenzo  Fluorouracil, 5-FU injection What is this medication? FLUOROURACIL, 5-FU (flure oh YOOR a sil) is a chemotherapy drug. It slows the growth of cancer cells. This medicine is used to treat many types of cancer like breast cancer, colon or rectal cancer, pancreatic cancer, and stomach cancer. This medicine may be used for other purposes; ask your health care provider or pharmacist if you have questions. COMMON BRAND NAME(S): Adrucil What should I tell my care team before I take this medication? They need to know if you have any of these conditions: blood disorders dihydropyrimidine dehydrogenase (DPD) deficiency infection (especially a virus infection such as chickenpox, cold sores, or herpes) kidney disease liver disease malnourished, poor nutrition recent or ongoing radiation therapy an unusual or allergic reaction to fluorouracil, other chemotherapy, other medicines, foods, dyes, or preservatives pregnant or trying to get  pregnant breast-feeding How should I use this medication? This drug is given as an infusion or injection into a vein. It is administered in a hospital or clinic by a specially trained health care professional. Talk to your pediatrician regarding the use of this medicine in children. Special care may be needed. Overdosage: If you think you have taken too much of this medicine contact a poison control center or emergency room at once. NOTE: This medicine is only for you. Do not share this medicine with others. What if I miss a dose? It is important not to miss your dose. Call your doctor or health care professional if you are unable to keep an appointment. What may interact with this medication? Do not take this medicine with any of the following medications: live virus vaccines This medicine may also interact with the following medications: medicines that treat or prevent blood clots like warfarin, enoxaparin, and dalteparin This list may not describe all possible interactions. Give your health care provider a list of all the medicines, herbs, non-prescription drugs, or dietary supplements you use. Also tell them if you smoke, drink alcohol, or use illegal drugs. Some items may interact with your medicine. What should I watch for while using this medication? Visit your doctor for checks on your progress. This drug may make you feel generally unwell. This is not uncommon, as chemotherapy can affect healthy cells as well as cancer cells. Report any side effects. Continue your course of treatment even though you feel ill unless your doctor tells you to stop. In some cases, you may be given additional medicines to help with side effects. Follow all directions for their use.  Call your doctor or health care professional for advice if you get a fever, chills or sore throat, or other symptoms of a cold or flu. Do not treat yourself. This drug decreases your body's ability to fight infections. Try to avoid  being around people who are sick. This medicine may increase your risk to bruise or bleed. Call your doctor or health care professional if you notice any unusual bleeding. Be careful brushing and flossing your teeth or using a toothpick because you may get an infection or bleed more easily. If you have any dental work done, tell your dentist you are receiving this medicine. Avoid taking products that contain aspirin, acetaminophen, ibuprofen, naproxen, or ketoprofen unless instructed by your doctor. These medicines may hide a fever. Do not become pregnant while taking this medicine. Women should inform their doctor if they wish to become pregnant or think they might be pregnant. There is a potential for serious side effects to an unborn child. Talk to your health care professional or pharmacist for more information. Do not breast-feed an infant while taking this medicine. Men should inform their doctor if they wish to father a child. This medicine may lower sperm counts. Do not treat diarrhea with over the counter products. Contact your doctor if you have diarrhea that lasts more than 2 days or if it is severe and watery. This medicine can make you more sensitive to the sun. Keep out of the sun. If you cannot avoid being in the sun, wear protective clothing and use sunscreen. Do not use sun lamps or tanning beds/booths. What side effects may I notice from receiving this medication? Side effects that you should report to your doctor or health care professional as soon as possible: allergic reactions like skin rash, itching or hives, swelling of the face, lips, or tongue low blood counts - this medicine may decrease the number of white blood cells, red blood cells and platelets. You may be at increased risk for infections and bleeding. signs of infection - fever or chills, cough, sore throat, pain or difficulty passing urine signs of decreased platelets or bleeding - bruising, pinpoint red spots on the  skin, black, tarry stools, blood in the urine signs of decreased red blood cells - unusually weak or tired, fainting spells, lightheadedness breathing problems changes in vision chest pain mouth sores nausea and vomiting pain, swelling, redness at site where injected pain, tingling, numbness in the hands or feet redness, swelling, or sores on hands or feet stomach pain unusual bleeding Side effects that usually do not require medical attention (report to your doctor or health care professional if they continue or are bothersome): changes in finger or toe nails diarrhea dry or itchy skin hair loss headache loss of appetite sensitivity of eyes to the light stomach upset unusually teary eyes This list may not describe all possible side effects. Call your doctor for medical advice about side effects. You may report side effects to FDA at 1-800-FDA-1088. Where should I keep my medication? This drug is given in a hospital or clinic and will not be stored at home. NOTE: This sheet is a summary. It may not cover all possible information. If you have questions about this medicine, talk to your doctor, pharmacist, or health care provider.  2022 Elsevier/Gold Standard (2020-11-05 00:00:00)  Pegfilgrastim Injection What is this medication? PEGFILGRASTIM (PEG fil gra stim) lowers the risk of infection in people who are receiving chemotherapy. It works by Building control surveyor make more white blood  cells, which protects your body from infection. It may also be used to help people who have been exposed to high doses of radiation. This medicine may be used for other purposes; ask your health care provider or pharmacist if you have questions. COMMON BRAND NAME(S): Fulphila, Neulasta, Nyvepria, UDENYCA, Ziextenzo, Flu vaccine What should I tell my care team before I take this medication? They need to know if you have any of these conditions: Kidney disease Latex allergy Ongoing radiation  therapy Sickle cell disease Skin reactions to acrylic adhesives (On-Body Injector only) An unusual or allergic reaction to pegfilgrastim, filgrastim, other medications, foods, dyes, or preservatives Pregnant or trying to get pregnant Breast-feeding How should I use this medication? This medication is for injection under the skin. If you get this medication at home, you will be taught how to prepare and give the pre-filled syringe or how to use the On-body Injector. Refer to the patient Instructions for Use for detailed instructions. Use exactly as directed. Tell your care team immediately if you suspect that the On-body Injector may not have performed as intended or if you suspect the use of the On-body Injector resulted in a missed or partial dose. It is important that you put your used needles and syringes in a special sharps container. Do not put them in a trash can. If you do not have a sharps container, call your pharmacist or care team to get one. Talk to your care team about the use of this medication in children. While this medication may be prescribed for selected conditions, precautions do apply. Overdosage: If you think you have taken too much of this medicine contact a poison control center or emergency room at once. NOTE: This medicine is only for you. Do not share this medicine with others. What if I miss a dose? It is important not to miss your dose. Call your care team if you miss your dose. If you miss a dose due to an On-body Injector failure or leakage, a new dose should be administered as soon as possible using a single prefilled syringe for manual use. What may interact with this medication? Interactions have not been studied. This list may not describe all possible interactions. Give your health care provider a list of all the medicines, herbs, non-prescription drugs, or dietary supplements you use. Also tell them if you smoke, drink alcohol, or use illegal drugs. Some items may  interact with your medicine. What should I watch for while using this medication? Your condition will be monitored carefully while you are receiving this medication. You may need blood work done while you are taking this medication. Talk to your care team about your risk of cancer. You may be more at risk for certain types of cancer if you take this medication. If you are going to need a MRI, CT scan, or other procedure, tell your care team that you are using this medication (On-Body Injector only). What side effects may I notice from receiving this medication? Side effects that you should report to your care team as soon as possible: Allergic reactions--skin rash, itching, hives, swelling of the face, lips, tongue, or throat Capillary leak syndrome--stomach or muscle pain, unusual weakness or fatigue, feeling faint or lightheaded, decrease in the amount of urine, swelling of the ankles, hands, or feet, trouble breathing High white blood cell level--fever, fatigue, trouble breathing, night sweats, change in vision, weight loss Inflammation of the aorta--fever, fatigue, back, chest, or stomach pain, severe headache Kidney injury (glomerulonephritis)--decrease  in the amount of urine, red or dark brown urine, foamy or bubbly urine, swelling of the ankles, hands, or feet Shortness of breath or trouble breathing Spleen injury--pain in upper left stomach or shoulder Unusual bruising or bleeding Side effects that usually do not require medical attention (report to your care team if they continue or are bothersome): Bone pain Pain in the hands or feet This list may not describe all possible side effects. Call your doctor for medical advice about side effects. You may report side effects to FDA at 1-800-FDA-1088. Where should I keep my medication? Keep out of the reach of children. If you are using this medication at home, you will be instructed on how to store it. Throw away any unused medication after  the expiration date on the label. NOTE: This sheet is a summary. It may not cover all possible information. If you have questions about this medicine, talk to your doctor, pharmacist, or health care provider.  2022 Elsevier/Gold Standard (2020-11-05 00:00:00)   Influenza Virus Vaccine injection What is this medication? INFLUENZA VIRUS VACCINE (in floo EN zuh VAHY ruhs vak SEEN) helps to reduce the risk of getting influenza also known as the flu. The vaccine only helps protect you against some strains of the flu. This medicine may be used for other purposes; ask your health care provider or pharmacist if you have questions. COMMON BRAND NAME(S): Afluria, Afluria Quadrivalent, Agriflu, Alfuria, FLUAD, FLUAD Quadrivalent, Fluarix, Fluarix Quadrivalent, Flublok, Flublok Quadrivalent, FLUCELVAX, FLUCELVAX Quadrivalent, Flulaval, Flulaval Quadrivalent, Fluvirin, Fluzone, Fluzone High-Dose, Fluzone Intradermal, Fluzone Quadrivalent What should I tell my care team before I take this medication? They need to know if you have any of these conditions: bleeding disorder like hemophilia fever or infection Guillain-Barre syndrome or other neurological problems immune system problems infection with the human immunodeficiency virus (HIV) or AIDS low blood platelet counts multiple sclerosis an unusual or allergic reaction to influenza virus vaccine, latex, other medicines, foods, dyes, or preservatives. Different brands of vaccines contain different allergens. Some may contain latex or eggs. Talk to your doctor about your allergies to make sure that you get the right vaccine. pregnant or trying to get pregnant breast-feeding How should I use this medication? This vaccine is for injection into a muscle or under the skin. It is given by a health care professional. A copy of Vaccine Information Statements will be given before each vaccination. Read this sheet carefully each time. The sheet may change  frequently. Talk to your healthcare provider to see which vaccines are right for you. Some vaccines should not be used in all age groups. Overdosage: If you think you have taken too much of this medicine contact a poison control center or emergency room at once. NOTE: This medicine is only for you. Do not share this medicine with others. What if I miss a dose? This does not apply. What may interact with this medication? chemotherapy or radiation therapy medicines that lower your immune system like etanercept, anakinra, infliximab, and adalimumab medicines that treat or prevent blood clots like warfarin phenytoin steroid medicines like prednisone or cortisone theophylline vaccines This list may not describe all possible interactions. Give your health care provider a list of all the medicines, herbs, non-prescription drugs, or dietary supplements you use. Also tell them if you smoke, drink alcohol, or use illegal drugs. Some items may interact with your medicine. What should I watch for while using this medication? Report any side effects that do not go away within 3 days  to your doctor or health care professional. Call your health care provider if any unusual symptoms occur within 6 weeks of receiving this vaccine. You may still catch the flu, but the illness is not usually as bad. You cannot get the flu from the vaccine. The vaccine will not protect against colds or other illnesses that may cause fever. The vaccine is needed every year. What side effects may I notice from receiving this medication? Side effects that you should report to your doctor or health care professional as soon as possible: allergic reactions like skin rash, itching or hives, swelling of the face, lips, or tongue Side effects that usually do not require medical attention (report to your doctor or health care professional if they continue or are bothersome): fever headache muscle aches and pains pain, tenderness,  redness, or swelling at the injection site tiredness This list may not describe all possible side effects. Call your doctor for medical advice about side effects. You may report side effects to FDA at 1-800-FDA-1088. Where should I keep my medication? The vaccine will be given by a health care professional in a clinic, pharmacy, doctor's office, or other health care setting. You will not be given vaccine doses to store at home. NOTE: This sheet is a summary. It may not cover all possible information. If you have questions about this medicine, talk to your doctor, pharmacist, or health care provider.  2022 Elsevier/Gold Standard (2020-11-05 00:00:00)     To help prevent nausea and vomiting after your treatment, we encourage you to take your nausea medication as directed.  BELOW ARE SYMPTOMS THAT SHOULD BE REPORTED IMMEDIATELY: *FEVER GREATER THAN 100.4 F (38 C) OR HIGHER *CHILLS OR SWEATING *NAUSEA AND VOMITING THAT IS NOT CONTROLLED WITH YOUR NAUSEA MEDICATION *UNUSUAL SHORTNESS OF BREATH *UNUSUAL BRUISING OR BLEEDING *URINARY PROBLEMS (pain or burning when urinating, or frequent urination) *BOWEL PROBLEMS (unusual diarrhea, constipation, pain near the anus) TENDERNESS IN MOUTH AND THROAT WITH OR WITHOUT PRESENCE OF ULCERS (sore throat, sores in mouth, or a toothache) UNUSUAL RASH, SWELLING OR PAIN  UNUSUAL VAGINAL DISCHARGE OR ITCHING   Items with * indicate a potential emergency and should be followed up as soon as possible or go to the Emergency Department if any problems should occur.  Please show the CHEMOTHERAPY ALERT CARD or IMMUNOTHERAPY ALERT CARD at check-in to the Emergency Department and triage nurse.  Should you have questions after your visit or need to cancel or reschedule your appointment, please contact [facility]  Dept: [phone]  and follow the prompts.  Office hours are 8:00 a.m. to 4:30 p.m. Monday - Friday. Please note that  voicemails left after 4:00 p.m. may not be returned until the following business day.  We are closed weekends and major holidays. You have access to a nurse at all times for urgent questions. Please call the main number to the clinic Dept: [phone] and follow the prompts.   For any non-urgent questions, you may also contact your provider using MyChart. We now offer e-Visits for anyone 12 and older to request care online for non-urgent symptoms. For details visit [email or web address].   Also download the MyChart app! Go to the app store, search "MyChart", open the app, select [facility], and log in with your MyChart username and password.  Due to Covid, a mask is required upon entering the hospital/clinic. If you do not have a mask, one will be given to you upon arrival. For doctor visits, patients may  have 1 support person aged 75 or older with them. For treatment visits, patients cannot have anyone with them due to current Covid guidelines and our immunocompromised population.

## 2021-02-01 ENCOUNTER — Other Ambulatory Visit: Payer: Self-pay | Admitting: Oncology

## 2021-02-03 ENCOUNTER — Telehealth: Payer: Self-pay

## 2021-02-03 ENCOUNTER — Encounter: Payer: Self-pay | Admitting: Nurse Practitioner

## 2021-02-03 ENCOUNTER — Inpatient Hospital Stay: Payer: Medicaid Other | Attending: Oncology

## 2021-02-03 ENCOUNTER — Inpatient Hospital Stay: Payer: Medicaid Other

## 2021-02-03 ENCOUNTER — Inpatient Hospital Stay (HOSPITAL_BASED_OUTPATIENT_CLINIC_OR_DEPARTMENT_OTHER): Payer: Medicaid Other | Admitting: Nurse Practitioner

## 2021-02-03 ENCOUNTER — Other Ambulatory Visit: Payer: Self-pay

## 2021-02-03 VITALS — BP 160/86 | HR 101 | Temp 97.8°F | Resp 20 | Ht 63.0 in | Wt 133.2 lb

## 2021-02-03 VITALS — BP 131/77 | HR 84

## 2021-02-03 DIAGNOSIS — C787 Secondary malignant neoplasm of liver and intrahepatic bile duct: Secondary | ICD-10-CM

## 2021-02-03 DIAGNOSIS — C252 Malignant neoplasm of tail of pancreas: Secondary | ICD-10-CM

## 2021-02-03 DIAGNOSIS — Z5111 Encounter for antineoplastic chemotherapy: Secondary | ICD-10-CM | POA: Diagnosis present

## 2021-02-03 DIAGNOSIS — D701 Agranulocytosis secondary to cancer chemotherapy: Secondary | ICD-10-CM | POA: Diagnosis not present

## 2021-02-03 DIAGNOSIS — T451X5A Adverse effect of antineoplastic and immunosuppressive drugs, initial encounter: Secondary | ICD-10-CM | POA: Diagnosis not present

## 2021-02-03 LAB — CMP (CANCER CENTER ONLY)
ALT: 20 U/L (ref 0–44)
AST: 27 U/L (ref 15–41)
Albumin: 3.7 g/dL (ref 3.5–5.0)
Alkaline Phosphatase: 217 U/L — ABNORMAL HIGH (ref 38–126)
Anion gap: 7 (ref 5–15)
BUN: 8 mg/dL (ref 8–23)
CO2: 28 mmol/L (ref 22–32)
Calcium: 9.9 mg/dL (ref 8.9–10.3)
Chloride: 107 mmol/L (ref 98–111)
Creatinine: 0.79 mg/dL (ref 0.44–1.00)
GFR, Estimated: >60 mL/min (ref >60)
Glucose, Bld: 110 mg/dL — ABNORMAL HIGH (ref 70–99)
Potassium: 3.9 mmol/L (ref 3.5–5.1)
Sodium: 142 mmol/L (ref 135–145)
Total Bilirubin: 0.3 mg/dL (ref 0.3–1.2)
Total Protein: 6.6 g/dL (ref 6.5–8.1)

## 2021-02-03 LAB — CBC WITH DIFFERENTIAL (CANCER CENTER ONLY)
Abs Immature Granulocytes: 0.09 10*3/uL — ABNORMAL HIGH (ref 0.00–0.07)
Basophils Absolute: 0.1 10*3/uL (ref 0.0–0.1)
Basophils Relative: 1 %
Eosinophils Absolute: 0.2 10*3/uL (ref 0.0–0.5)
Eosinophils Relative: 2 %
HCT: 33.8 % — ABNORMAL LOW (ref 36.0–46.0)
Hemoglobin: 10.6 g/dL — ABNORMAL LOW (ref 12.0–15.0)
Immature Granulocytes: 1 %
Lymphocytes Relative: 31 %
Lymphs Abs: 3.1 10*3/uL (ref 0.7–4.0)
MCH: 30.4 pg (ref 26.0–34.0)
MCHC: 31.4 g/dL (ref 30.0–36.0)
MCV: 96.8 fL (ref 80.0–100.0)
Monocytes Absolute: 0.9 10*3/uL (ref 0.1–1.0)
Monocytes Relative: 9 %
Neutro Abs: 5.6 10*3/uL (ref 1.7–7.7)
Neutrophils Relative %: 56 %
Platelet Count: 255 10*3/uL (ref 150–400)
RBC: 3.49 MIL/uL — ABNORMAL LOW (ref 3.87–5.11)
RDW: 15.6 % — ABNORMAL HIGH (ref 11.5–15.5)
WBC Count: 10 10*3/uL (ref 4.0–10.5)
nRBC: 0 % (ref 0.0–0.2)

## 2021-02-03 LAB — MAGNESIUM: Magnesium: 1.9 mg/dL (ref 1.7–2.4)

## 2021-02-03 MED ORDER — SODIUM CHLORIDE 0.9 % IV SOLN: INTRAVENOUS | Status: DC

## 2021-02-03 MED ORDER — SODIUM CHLORIDE 0.9 % IV SOLN
2400.0000 mg/m2 | INTRAVENOUS | Status: DC
  Administered 2021-02-03: 3850 mg via INTRAVENOUS
  Filled 2021-02-03: qty 77

## 2021-02-03 MED ORDER — PALONOSETRON HCL INJECTION 0.25 MG/5ML
0.2500 mg | Freq: Once | INTRAVENOUS | Status: AC
  Administered 2021-02-03: 0.25 mg via INTRAVENOUS
  Filled 2021-02-03: qty 5

## 2021-02-03 MED ORDER — SODIUM CHLORIDE 0.9 % IV SOLN
125.0000 mg/m2 | Freq: Once | INTRAVENOUS | Status: AC
  Administered 2021-02-03: 200 mg via INTRAVENOUS
  Filled 2021-02-03: qty 10

## 2021-02-03 MED ORDER — SODIUM CHLORIDE 0.9 % IV SOLN
400.0000 mg/m2 | Freq: Once | INTRAVENOUS | Status: AC
  Administered 2021-02-03: 644 mg via INTRAVENOUS
  Filled 2021-02-03: qty 32.2

## 2021-02-03 MED ORDER — SODIUM CHLORIDE 0.9 % IV SOLN
10.0000 mg | Freq: Once | INTRAVENOUS | Status: AC
  Administered 2021-02-03: 10 mg via INTRAVENOUS
  Filled 2021-02-03: qty 1

## 2021-02-03 MED ORDER — SODIUM CHLORIDE 0.9 % IV SOLN
150.0000 mg | Freq: Once | INTRAVENOUS | Status: AC
  Administered 2021-02-03: 150 mg via INTRAVENOUS
  Filled 2021-02-03: qty 5

## 2021-02-03 MED ORDER — ATROPINE SULFATE 1 MG/ML IV SOLN
0.5000 mg | Freq: Once | INTRAVENOUS | Status: AC | PRN
  Administered 2021-02-03: 0.5 mg via INTRAVENOUS
  Filled 2021-02-03: qty 1

## 2021-02-03 NOTE — Progress Notes (Signed)
  [facility] OFFICE PROGRESS NOTE   Diagnosis: Pancreas cancer  INTERVAL HISTORY:   Kirsten [name] returns as scheduled.  She completed another cycle of FOLFIRI 01/20/2021.  She denies nausea/vomiting.  No mouth sores.  No diarrhea.  Neuropathy symptoms are unchanged.  She denies pain.  She describes her appetite as "okay".  Objective:  Vital signs in last 24 hours:  Blood pressure (!) 160/86, pulse (!) 101, temperature 97.8 F (36.6 C), temperature source Oral, resp. rate 20, height 5\' 3"  (1.6 m), weight 133 lb 3.2 oz (60.4 kg), SpO2 100 %.    HEENT: No thrush or ulcers. Resp: Lungs clear bilaterally. Cardio: Regular rate and rhythm. GI: Abdomen soft and nontender.  No hepatomegaly. Vascular: No leg edema.  Calves soft and nontender. Skin: Palms without erythema. Port-A-Cath without erythema.   Lab Results:  Lab Results  Component Value Date   WBC 10.0 02/03/2021   HGB 10.6 (L) 02/03/2021   HCT 33.8 (L) 02/03/2021   MCV 96.8 02/03/2021   PLT 255 02/03/2021   NEUTROABS 5.6 02/03/2021    Imaging:  No results found.  Medications: I have reviewed the patient's current medications.  Assessment/Plan: Metastatic pancreas cancer Right upper quadrant ultrasound 06/17/2020-multiple hypoechoic liver lesions CT abdomen/pelvis 06/17/2020- pancreas tail mass, multiple liver metastases, enlarged periaortic nodes, right ovary metastasis?,  Nodularity at the left paracolic gutter and posterior/inferior stomach suggesting peritoneal tumor spread CT chest 06/17/2020- scattered small pulmonary nodules consistent with metastatic disease Ultrasound-guided biopsy of a left liver mass 06/18/2020- adenocarcinoma, morphology compatible with metastatic pancreatic adenocarcinoma Cycle 1 FOLFOX 07/01/2020 Cycle 2 FOLFOX 07/16/2020 Cycle 3 FOLFOX 07/31/2020 Cycle 4 FOLFIRINOX 08/13/2020 Cycle 5 FOLFIRINOX 09/03/2020, Udenyca CTs 09/13/2020-unchanged nodule medial left lower lobe; stable  nodal and likely peritoneal disease in the abdomen/pelvis; unchanged pancreatic tail mass; multifocal hepatic metastases, mildly progressive Cycle 6 FOLFIRINOX 09/16/2020, Oxaliplatin held Cycle 7 FOLFIRINOX 09/30/2020 Cycle 8 FOLFIRINOX 10/14/2020, oxaliplatin held Cycle 9 FOLFIRINOX 10/28/2020, oxaliplatin held Cycle 10 FOLFIRINOX 11/11/2020, oxaliplatin held Cycle 11 FOLFIRINOX 11/25/2020, oxaliplatin held CTs 12/06/2020-primary pancreatic tail tumor decreased in size.  Widespread liver metastases all decreased.  Previously visualized peritoneal nodule adjacent to the distal stomach resolved.  Left lower lobe pulmonary nodule stable.  No new or progressive metastatic disease. Cycle 12 FOLFIRI 12/09/2020 Cycle 13 FOLFIRI 12/23/2020 Cycle 14 FOLFIRI 01/06/2021 Cycle 15 FOLFIRI 01/20/2021 Cycle 16 FOLFIRI 02/03/2021 2.  Pain secondary to #1-improved 3.  Weight loss-improved 4.  Family history of pancreas cancer 5.  Neutropenia secondary chemotherapy-G-CSF added with cycle 5 FOLFIRINOX 6.  Oxaliplatin neuropathy-oxaliplatin held 09/16/2020, 10/14/2020, 10/28/2020, 11/11/2020, 11/25/2020    Disposition: Kirsten [name] appears stable.  There is no clinical evidence of disease progression.  Plan to proceed with cycle 16 FOLFIRI today as scheduled.  Restaging CTs after cycle 17.  CBC and chemistry panel from today reviewed.  Labs adequate to proceed with treatment.  She will return for lab, follow-up, cycle 17 FOLFIRI in 2 weeks.  She will contact the office in the interim with any problems.    Ned Card ANP/GNP-BC   02/03/2021  9:23 AM

## 2021-02-03 NOTE — Progress Notes (Signed)
Patient presents for treatment. RN assessment completed along with the following:  Labs/vitals reviewed - Yes, and within treatment parameters.   Weight within 10% of previous measurement - Yes Oncology Treatment Attestation completed for current therapy- Yes, on date 06/27/20 Informed consent completed and reflects current therapy/intent - Yes, on date 07/01/20             Provider progress note reviewed - Yes, today's provider note was reviewed. Treatment/Antibody/Supportive plan reviewed - Yes, and there are no adjustments needed for today's treatment. S&H and other orders reviewed - Yes, and there are no additional orders identified. Previous treatment date reviewed - Yes, and the appropriate amount of time has elapsed between treatments.   Patient to proceed with treatment.

## 2021-02-03 NOTE — Telephone Encounter (Signed)
Patient seen by Lisa Thomas NP today  Vitals are within treatment parameters.  Labs reviewed by Lisa Thomas NP and are within treatment parameters.  Per physician team, patient is ready for treatment and there are NO modifications to the treatment plan.     

## 2021-02-03 NOTE — Patient Instructions (Addendum)
[facility]  Discharge Instructions: Thank you for choosing [facility] to provide your oncology and hematology care.   If you have a lab appointment with the [facility], please go directly to the [facility] and check in at the registration area.   Wear comfortable clothing and clothing appropriate for easy access to any Portacath or PICC line.   We strive to give you quality time with your provider. You may need to reschedule your appointment if you arrive late (15 or more minutes).  Arriving late affects you and other patients whose appointments are after yours.  Also, if you miss three or more appointments without notifying the office, you may be dismissed from the clinic at the provider's discretion.      For prescription refill requests, have your pharmacy contact our office and allow 72 hours for refills to be completed.    Today you received the following chemotherapy and/or immunotherapy agents Irimptecan, Leucovorin, 85f  Irinotecan injection What is this medication? IRINOTECAN (ir in oh TEE kan ) is a chemotherapy drug. It is used to treat colon and rectal cancer. This medicine may be used for other purposes; ask your health care provider or pharmacist if you have questions. COMMON BRAND NAME(S): Camptosar What should I tell my care team before I take this medication? They need to know if you have any of these conditions: dehydration diarrhea infection (especially a virus infection such as chickenpox, cold sores, or herpes) liver disease low blood counts, like low white cell, platelet, or red cell counts low levels of calcium, magnesium, or potassium in the blood recent or ongoing radiation therapy an unusual or allergic reaction to irinotecan, other medicines, foods, dyes, or preservatives pregnant or trying to get pregnant breast-feeding How should I use this medication? This drug is given as an infusion into a vein. It is  administered in a hospital or clinic by a specially trained health care professional. Talk to your pediatrician regarding the use of this medicine in children. Special care may be needed. Overdosage: If you think you have taken too much of this medicine contact a poison control center or emergency room at once. NOTE: This medicine is only for you. Do not share this medicine with others. What if I miss a dose? It is important not to miss your dose. Call your doctor or health care professional if you are unable to keep an appointment. What may interact with this medication? Do not take this medicine with any of the following medications: cobicistat itraconazole This medicine may interact with the following medications: antiviral medicines for HIV or AIDS certain antibiotics like rifampin or rifabutin certain medicines for fungal infections like ketoconazole, posaconazole, and voriconazole certain medicines for seizures like carbamazepine, phenobarbital, phenotoin clarithromycin gemfibrozil nefazodone [facility]. John's Wort This list may not describe all possible interactions. Give your health care provider a list of all the medicines, herbs, non-prescription drugs, or dietary supplements you use. Also tell them if you smoke, drink alcohol, or use illegal drugs. Some items may interact with your medicine. What should I watch for while using this medication? Your condition will be monitored carefully while you are receiving this medicine. You will need important blood work done while you are taking this medicine. This drug may make you feel generally unwell. This is not uncommon, as chemotherapy can affect healthy cells as well as cancer cells. Report any side effects. Continue your course of treatment even though you feel ill  unless your doctor tells you to stop. In some cases, you may be given additional medicines to help with side effects. Follow all directions for their use. You may get drowsy or  dizzy. Do not drive, use machinery, or do anything that needs mental alertness until you know how this medicine affects you. Do not stand or sit up quickly, especially if you are an older patient. This reduces the risk of dizzy or fainting spells. Call your health care professional for advice if you get a fever, chills, or sore throat, or other symptoms of a cold or flu. Do not treat yourself. This medicine decreases your body's ability to fight infections. Try to avoid being around people who are sick. Avoid taking products that contain aspirin, acetaminophen, ibuprofen, naproxen, or ketoprofen unless instructed by your doctor. These medicines may hide a fever. This medicine may increase your risk to bruise or bleed. Call your doctor or health care professional if you notice any unusual bleeding. Be careful brushing and flossing your teeth or using a toothpick because you may get an infection or bleed more easily. If you have any dental work done, tell your dentist you are receiving this medicine. Do not become pregnant while taking this medicine or for 6 months after stopping it. Women should inform their health care professional if they wish to become pregnant or think they might be pregnant. Men should not father a child while taking this medicine and for 3 months after stopping it. There is potential for serious side effects to an unborn child. Talk to your health care professional for more information. Do not breast-feed an infant while taking this medicine or for 7 days after stopping it. This medicine has caused ovarian failure in some women. This medicine may make it more difficult to get pregnant. Talk to your health care professional if you are concerned about your fertility. This medicine has caused decreased sperm counts in some men. This may make it more difficult to father a child. Talk to your health care professional if you are concerned about your fertility. What side effects may I notice  from receiving this medication? Side effects that you should report to your doctor or health care professional as soon as possible: allergic reactions like skin rash, itching or hives, swelling of the face, lips, or tongue chest pain diarrhea flushing, runny nose, sweating during infusion low blood counts - this medicine may decrease the number of white blood cells, red blood cells and platelets. You may be at increased risk for infections and bleeding. nausea, vomiting pain, swelling, warmth in the leg signs of decreased platelets or bleeding - bruising, pinpoint red spots on the skin, black, tarry stools, blood in the urine signs of infection - fever or chills, cough, sore throat, pain or difficulty passing urine signs of decreased red blood cells - unusually weak or tired, fainting spells, lightheadedness Side effects that usually do not require medical attention (report to your doctor or health care professional if they continue or are bothersome): constipation hair loss headache loss of appetite mouth sores stomach pain This list may not describe all possible side effects. Call your doctor for medical advice about side effects. You may report side effects to FDA at 1-800-FDA-1088. Where should I keep my medication? This drug is given in a hospital or clinic and will not be stored at home. NOTE: This sheet is a summary. It may not cover all possible information. If you have questions about this medicine, talk to  your doctor, pharmacist, or health care provider.  2022 Elsevier/Gold Standard (2020-11-05 00:00:00)  Leucovorin injection What is this medication? LEUCOVORIN (loo koe VOR in) is used to prevent or treat the harmful effects of some medicines. This medicine is used to treat anemia caused by a low amount of folic acid in the body. It is also used with 5-fluorouracil (5-FU) to treat colon cancer. This medicine may be used for other purposes; ask your health care provider or  pharmacist if you have questions. What should I tell my care team before I take this medication? They need to know if you have any of these conditions: anemia from low levels of vitamin B-12 in the blood an unusual or allergic reaction to leucovorin, folic acid, other medicines, foods, dyes, or preservatives pregnant or trying to get pregnant breast-feeding How should I use this medication? This medicine is for injection into a muscle or into a vein. It is given by a health care professional in a hospital or clinic setting. Talk to your pediatrician regarding the use of this medicine in children. Special care may be needed. Overdosage: If you think you have taken too much of this medicine contact a poison control center or emergency room at once. NOTE: This medicine is only for you. Do not share this medicine with others. What if I miss a dose? This does not apply. What may interact with this medication? capecitabine fluorouracil phenobarbital phenytoin primidone trimethoprim-sulfamethoxazole This list may not describe all possible interactions. Give your health care provider a list of all the medicines, herbs, non-prescription drugs, or dietary supplements you use. Also tell them if you smoke, drink alcohol, or use illegal drugs. Some items may interact with your medicine. What should I watch for while using this medication? Your condition will be monitored carefully while you are receiving this medicine. This medicine may increase the side effects of 5-fluorouracil, 5-FU. Tell your doctor or health care professional if you have diarrhea or mouth sores that do not get better or that get worse. What side effects may I notice from receiving this medication? Side effects that you should report to your doctor or health care professional as soon as possible: allergic reactions like skin rash, itching or hives, swelling of the face, lips, or tongue breathing problems fever, infection mouth  sores unusual bleeding or bruising unusually weak or tired Side effects that usually do not require medical attention (report to your doctor or health care professional if they continue or are bothersome): constipation or diarrhea loss of appetite nausea, vomiting This list may not describe all possible side effects. Call your doctor for medical advice about side effects. You may report side effects to FDA at 1-800-FDA-1088. Where should I keep my medication? This drug is given in a hospital or clinic and will not be stored at home. NOTE: This sheet is a summary. It may not cover all possible information. If you have questions about this medicine, talk to your doctor, pharmacist, or health care provider.  2022 Elsevier/Gold Standard (2007-08-25 00:00:00)  Fluorouracil, 5-FU injection What is this medication? FLUOROURACIL, 5-FU (flure oh YOOR a sil) is a chemotherapy drug. It slows the growth of cancer cells. This medicine is used to treat many types of cancer like breast cancer, colon or rectal cancer, pancreatic cancer, and stomach cancer. This medicine may be used for other purposes; ask your health care provider or pharmacist if you have questions. COMMON BRAND NAME(S): Adrucil What should I tell my care team before I   take this medication? They need to know if you have any of these conditions: blood disorders dihydropyrimidine dehydrogenase (DPD) deficiency infection (especially a virus infection such as chickenpox, cold sores, or herpes) kidney disease liver disease malnourished, poor nutrition recent or ongoing radiation therapy an unusual or allergic reaction to fluorouracil, other chemotherapy, other medicines, foods, dyes, or preservatives pregnant or trying to get pregnant breast-feeding How should I use this medication? This drug is given as an infusion or injection into a vein. It is administered in a hospital or clinic by a specially trained health care professional. Talk  to your pediatrician regarding the use of this medicine in children. Special care may be needed. Overdosage: If you think you have taken too much of this medicine contact a poison control center or emergency room at once. NOTE: This medicine is only for you. Do not share this medicine with others. What if I miss a dose? It is important not to miss your dose. Call your doctor or health care professional if you are unable to keep an appointment. What may interact with this medication? Do not take this medicine with any of the following medications: live virus vaccines This medicine may also interact with the following medications: medicines that treat or prevent blood clots like warfarin, enoxaparin, and dalteparin This list may not describe all possible interactions. Give your health care provider a list of all the medicines, herbs, non-prescription drugs, or dietary supplements you use. Also tell them if you smoke, drink alcohol, or use illegal drugs. Some items may interact with your medicine. What should I watch for while using this medication? Visit your doctor for checks on your progress. This drug may make you feel generally unwell. This is not uncommon, as chemotherapy can affect healthy cells as well as cancer cells. Report any side effects. Continue your course of treatment even though you feel ill unless your doctor tells you to stop. In some cases, you may be given additional medicines to help with side effects. Follow all directions for their use. Call your doctor or health care professional for advice if you get a fever, chills or sore throat, or other symptoms of a cold or flu. Do not treat yourself. This drug decreases your body's ability to fight infections. Try to avoid being around people who are sick. This medicine may increase your risk to bruise or bleed. Call your doctor or health care professional if you notice any unusual bleeding. Be careful brushing and flossing your teeth or  using a toothpick because you may get an infection or bleed more easily. If you have any dental work done, tell your dentist you are receiving this medicine. Avoid taking products that contain aspirin, acetaminophen, ibuprofen, naproxen, or ketoprofen unless instructed by your doctor. These medicines may hide a fever. Do not become pregnant while taking this medicine. Women should inform their doctor if they wish to become pregnant or think they might be pregnant. There is a potential for serious side effects to an unborn child. Talk to your health care professional or pharmacist for more information. Do not breast-feed an infant while taking this medicine. Men should inform their doctor if they wish to father a child. This medicine may lower sperm counts. Do not treat diarrhea with over the counter products. Contact your doctor if you have diarrhea that lasts more than 2 days or if it is severe and watery. This medicine can make you more sensitive to the sun. Keep out of the sun. If   you cannot avoid being in the sun, wear protective clothing and use sunscreen. Do not use sun lamps or tanning beds/booths. What side effects may I notice from receiving this medication? Side effects that you should report to your doctor or health care professional as soon as possible: allergic reactions like skin rash, itching or hives, swelling of the face, lips, or tongue low blood counts - this medicine may decrease the number of white blood cells, red blood cells and platelets. You may be at increased risk for infections and bleeding. signs of infection - fever or chills, cough, sore throat, pain or difficulty passing urine signs of decreased platelets or bleeding - bruising, pinpoint red spots on the skin, black, tarry stools, blood in the urine signs of decreased red blood cells - unusually weak or tired, fainting spells, lightheadedness breathing problems changes in vision chest pain mouth sores nausea and  vomiting pain, swelling, redness at site where injected pain, tingling, numbness in the hands or feet redness, swelling, or sores on hands or feet stomach pain unusual bleeding Side effects that usually do not require medical attention (report to your doctor or health care professional if they continue or are bothersome): changes in finger or toe nails diarrhea dry or itchy skin hair loss headache loss of appetite sensitivity of eyes to the light stomach upset unusually teary eyes This list may not describe all possible side effects. Call your doctor for medical advice about side effects. You may report side effects to FDA at 1-800-FDA-1088. Where should I keep my medication? This drug is given in a hospital or clinic and will not be stored at home. NOTE: This sheet is a summary. It may not cover all possible information. If you have questions about this medicine, talk to your doctor, pharmacist, or health care provider.  2022 Elsevier/Gold Standard (2020-11-05 00:00:00)           To help prevent nausea and vomiting after your treatment, we encourage you to take your nausea medication as directed.  BELOW ARE SYMPTOMS THAT SHOULD BE REPORTED IMMEDIATELY: *FEVER GREATER THAN 100.4 F (38 C) OR HIGHER *CHILLS OR SWEATING *NAUSEA AND VOMITING THAT IS NOT CONTROLLED WITH YOUR NAUSEA MEDICATION *UNUSUAL SHORTNESS OF BREATH *UNUSUAL BRUISING OR BLEEDING *URINARY PROBLEMS (pain or burning when urinating, or frequent urination) *BOWEL PROBLEMS (unusual diarrhea, constipation, pain near the anus) TENDERNESS IN MOUTH AND THROAT WITH OR WITHOUT PRESENCE OF ULCERS (sore throat, sores in mouth, or a toothache) UNUSUAL RASH, SWELLING OR PAIN  UNUSUAL VAGINAL DISCHARGE OR ITCHING   Items with * indicate a potential emergency and should be followed up as soon as possible or go to the Emergency Department if any problems should occur.  Please show the CHEMOTHERAPY ALERT CARD or  IMMUNOTHERAPY ALERT CARD at check-in to the Emergency Department and triage nurse.  Should you have questions after your visit or need to cancel or reschedule your appointment, please contact [facility]  Dept: [phone]  and follow the prompts.  Office hours are 8:00 a.m. to 4:30 p.m. Monday - Friday. Please note that voicemails left after 4:00 p.m. may not be returned until the following business day.  We are closed weekends and major holidays. You have access to a nurse at all times for urgent questions. Please call the main number to the clinic Dept: [phone] and follow the prompts.   For any non-urgent questions, you may also contact your provider using MyChart. We now offer e-Visits for anyone  18 and older to request care online for non-urgent symptoms. For details visit [email or web address].   Also download the MyChart app! Go to the app store, search "MyChart", open the app, select [facility], and log in with your MyChart username and password.  Due to Covid, a mask is required upon entering the hospital/clinic. If you do not have a mask, one will be given to you upon arrival. For doctor visits, patients may have 1 support person aged 37 or older with them. For treatment visits, patients cannot have anyone with them due to current Covid guidelines and our immunocompromised population.   The chemotherapy medication bag should finish at 46 hours, 96 hours, or 7 days. For example, if your pump is scheduled for 46 hours and it was put on at 4:00 p.m., it should finish at 2:00 p.m. the day it is scheduled to come off regardless of your appointment time.     Estimated time to finish at 11:45 am. on Wednesday 02/05/2021.   If the display on your pump reads "Low Volume" and it is beeping, take the batteries out of the pump and come to the cancer center for it to be taken off.   If the pump alarms go off prior to the pump reading "Low Volume" then call  [phone] and someone can assist you.  If the plunger comes out and the chemotherapy medication is leaking out, please use your home chemo spill kit to clean up the spill. Do NOT use paper towels or other household products.  If you have problems or questions regarding your pump, please call either [phone] (24 hours a day) or the cancer center Monday-Friday 8:00 a.m.- 4:30 p.m. at the clinic number and we will assist you. If you are unable to get assistance, then go to the nearest Emergency Department and ask the staff to contact the IV team for assistance.

## 2021-02-04 LAB — CANCER ANTIGEN 19-9: CA 19-9: 2893 U/mL — ABNORMAL HIGH (ref 0–35)

## 2021-02-05 ENCOUNTER — Inpatient Hospital Stay: Payer: Medicaid Other

## 2021-02-05 ENCOUNTER — Other Ambulatory Visit (HOSPITAL_BASED_OUTPATIENT_CLINIC_OR_DEPARTMENT_OTHER): Payer: Self-pay

## 2021-02-05 ENCOUNTER — Ambulatory Visit: Payer: Medicaid Other | Attending: Internal Medicine

## 2021-02-05 ENCOUNTER — Other Ambulatory Visit: Payer: Self-pay

## 2021-02-05 VITALS — BP 148/85 | HR 86 | Temp 98.5°F | Resp 19

## 2021-02-05 DIAGNOSIS — C252 Malignant neoplasm of tail of pancreas: Secondary | ICD-10-CM

## 2021-02-05 DIAGNOSIS — Z23 Encounter for immunization: Secondary | ICD-10-CM

## 2021-02-05 DIAGNOSIS — Z5111 Encounter for antineoplastic chemotherapy: Secondary | ICD-10-CM | POA: Diagnosis not present

## 2021-02-05 MED ORDER — HEPARIN SOD (PORK) LOCK FLUSH 100 UNIT/ML IV SOLN
500.0000 [IU] | Freq: Once | INTRAVENOUS | Status: AC | PRN
  Administered 2021-02-05: 500 [IU]

## 2021-02-05 MED ORDER — PEGFILGRASTIM-BMEZ 6 MG/0.6ML ~~LOC~~ SOSY
6.0000 mg | PREFILLED_SYRINGE | Freq: Once | SUBCUTANEOUS | Status: AC
  Administered 2021-02-05: 6 mg via SUBCUTANEOUS

## 2021-02-05 MED ORDER — SODIUM CHLORIDE 0.9% FLUSH
10.0000 mL | INTRAVENOUS | Status: DC | PRN
  Administered 2021-02-05: 10 mL

## 2021-02-05 MED ORDER — PFIZER-BIONT COVID-19 VAC-TRIS 30 MCG/0.3ML IM SUSP
INTRAMUSCULAR | 0 refills | Status: DC
Start: 2021-02-05 — End: 2021-03-06
  Filled 2021-02-05: qty 0.3, 1d supply, fill #0

## 2021-02-05 NOTE — Progress Notes (Signed)
   Covid-19 Vaccination Clinic  Name:  Kirsten [name]    MRN: [id number] DOB: May 14, 1960  02/05/2021  [name] was observed post Covid-19 immunization for 15 minutes without incident. She was provided with Vaccine Information Sheet and instruction to access the V-Safe system.   [name] was instructed to call 911 with any severe reactions post vaccine: Difficulty breathing  Swelling of face and throat  A fast heartbeat  A bad rash all over body  Dizziness and weakness   Immunizations Administered     Name Date Dose VIS Date Route   PFIZER Comrnaty(Gray TOP) Covid-19 Vaccine 02/05/2021 12:22 PM 0.3 mL 10/30/2020 Intramuscular   Manufacturer: [place]   Lot: [id number]   [place]: [phone]

## 2021-02-05 NOTE — Patient Instructions (Signed)
[facility]  Discharge Instructions: Thank you for choosing [facility] to provide your oncology and hematology care.   If you have a lab appointment with the [facility], please go directly to the [facility] and check in at the registration area.   Wear comfortable clothing and clothing appropriate for easy access to any Portacath or PICC line.   We strive to give you quality time with your provider. You may need to reschedule your appointment if you arrive late (15 or more minutes).  Arriving late affects you and other patients whose appointments are after yours.  Also, if you miss three or more appointments without notifying the office, you may be dismissed from the clinic at the provider's discretion.      For prescription refill requests, have your pharmacy contact our office and allow 72 hours for refills to be completed.    Today you received the following Ziextenzo     To help prevent nausea and vomiting after your treatment, we encourage you to take your nausea medication as directed.  BELOW ARE SYMPTOMS THAT SHOULD BE REPORTED IMMEDIATELY: *FEVER GREATER THAN 100.4 F (38 C) OR HIGHER *CHILLS OR SWEATING *NAUSEA AND VOMITING THAT IS NOT CONTROLLED WITH YOUR NAUSEA MEDICATION *UNUSUAL SHORTNESS OF BREATH *UNUSUAL BRUISING OR BLEEDING *URINARY PROBLEMS (pain or burning when urinating, or frequent urination) *BOWEL PROBLEMS (unusual diarrhea, constipation, pain near the anus) TENDERNESS IN MOUTH AND THROAT WITH OR WITHOUT PRESENCE OF ULCERS (sore throat, sores in mouth, or a toothache) UNUSUAL RASH, SWELLING OR PAIN  UNUSUAL VAGINAL DISCHARGE OR ITCHING   Items with * indicate a potential emergency and should be followed up as soon as possible or go to the Emergency Department if any problems should occur.  Please show the CHEMOTHERAPY ALERT CARD or IMMUNOTHERAPY ALERT CARD at check-in to the Emergency Department and triage  nurse.  Should you have questions after your visit or need to cancel or reschedule your appointment, please contact [facility]  Dept: [phone]  and follow the prompts.  Office hours are 8:00 a.m. to 4:30 p.m. Monday - Friday. Please note that voicemails left after 4:00 p.m. may not be returned until the following business day.  We are closed weekends and major holidays. You have access to a nurse at all times for urgent questions. Please call the main number to the clinic Dept: [phone] and follow the prompts.   For any non-urgent questions, you may also contact your provider using MyChart. We now offer e-Visits for anyone 81 and older to request care online for non-urgent symptoms. For details visit [email or web address].   Also download the MyChart app! Go to the app store, search "MyChart", open the app, select [facility], and log in with your MyChart username and password.  Due to Covid, a mask is required upon entering the hospital/clinic. If you do not have a mask, one will be given to you upon arrival. For doctor visits, patients may have 1 support person aged 8 or older with them. For treatment visits, patients cannot have anyone with them due to current Covid guidelines and our immunocompromised population.   Pegfilgrastim Injection What is this medication? PEGFILGRASTIM (PEG fil gra stim) lowers the risk of infection in people who are receiving chemotherapy. It works by Building control surveyor make more white blood cells, which protects your body from infection. It may also be used to help people who have been exposed to high doses  of radiation. This medicine may be used for other purposes; ask your health care provider or pharmacist if you have questions. COMMON BRAND NAME(S): Rexene Edison, Ziextenzo What should I tell my care team before I take this medication? They need to know if you have any of these conditions: Kidney  disease Latex allergy Ongoing radiation therapy Sickle cell disease Skin reactions to acrylic adhesives (On-Body Injector only) An unusual or allergic reaction to pegfilgrastim, filgrastim, other medications, foods, dyes, or preservatives Pregnant or trying to get pregnant Breast-feeding How should I use this medication? This medication is for injection under the skin. If you get this medication at home, you will be taught how to prepare and give the pre-filled syringe or how to use the On-body Injector. Refer to the patient Instructions for Use for detailed instructions. Use exactly as directed. Tell your care team immediately if you suspect that the On-body Injector may not have performed as intended or if you suspect the use of the On-body Injector resulted in a missed or partial dose. It is important that you put your used needles and syringes in a special sharps container. Do not put them in a trash can. If you do not have a sharps container, call your pharmacist or care team to get one. Talk to your care team about the use of this medication in children. While this medication may be prescribed for selected conditions, precautions do apply. Overdosage: If you think you have taken too much of this medicine contact a poison control center or emergency room at once. NOTE: This medicine is only for you. Do not share this medicine with others. What if I miss a dose? It is important not to miss your dose. Call your care team if you miss your dose. If you miss a dose due to an On-body Injector failure or leakage, a new dose should be administered as soon as possible using a single prefilled syringe for manual use. What may interact with this medication? Interactions have not been studied. This list may not describe all possible interactions. Give your health care provider a list of all the medicines, herbs, non-prescription drugs, or dietary supplements you use. Also tell them if you smoke, drink  alcohol, or use illegal drugs. Some items may interact with your medicine. What should I watch for while using this medication? Your condition will be monitored carefully while you are receiving this medication. You may need blood work done while you are taking this medication. Talk to your care team about your risk of cancer. You may be more at risk for certain types of cancer if you take this medication. If you are going to need a MRI, CT scan, or other procedure, tell your care team that you are using this medication (On-Body Injector only). What side effects may I notice from receiving this medication? Side effects that you should report to your care team as soon as possible: Allergic reactions--skin rash, itching, hives, swelling of the face, lips, tongue, or throat Capillary leak syndrome--stomach or muscle pain, unusual weakness or fatigue, feeling faint or lightheaded, decrease in the amount of urine, swelling of the ankles, hands, or feet, trouble breathing High white blood cell level--fever, fatigue, trouble breathing, night sweats, change in vision, weight loss Inflammation of the aorta--fever, fatigue, back, chest, or stomach pain, severe headache Kidney injury (glomerulonephritis)--decrease in the amount of urine, red or dark brown urine, foamy or bubbly urine, swelling of the ankles, hands, or feet Shortness of breath  or trouble breathing Spleen injury--pain in upper left stomach or shoulder Unusual bruising or bleeding Side effects that usually do not require medical attention (report to your care team if they continue or are bothersome): Bone pain Pain in the hands or feet This list may not describe all possible side effects. Call your doctor for medical advice about side effects. You may report side effects to FDA at 1-800-FDA-1088. Where should I keep my medication? Keep out of the reach of children. If you are using this medication at home, you will be instructed on how to  store it. Throw away any unused medication after the expiration date on the label. NOTE: This sheet is a summary. It may not cover all possible information. If you have questions about this medicine, talk to your doctor, pharmacist, or health care provider.  2022 Elsevier/Gold Standard (2020-11-05 00:00:00)

## 2021-02-16 ENCOUNTER — Other Ambulatory Visit: Payer: Self-pay | Admitting: Oncology

## 2021-02-17 ENCOUNTER — Encounter: Payer: Self-pay | Admitting: Nurse Practitioner

## 2021-02-17 ENCOUNTER — Inpatient Hospital Stay: Payer: Medicaid Other

## 2021-02-17 ENCOUNTER — Other Ambulatory Visit: Payer: Self-pay

## 2021-02-17 ENCOUNTER — Inpatient Hospital Stay (HOSPITAL_BASED_OUTPATIENT_CLINIC_OR_DEPARTMENT_OTHER): Payer: Medicaid Other | Admitting: Nurse Practitioner

## 2021-02-17 VITALS — BP 161/83 | HR 96 | Temp 98.1°F | Resp 20 | Ht 63.0 in | Wt 133.0 lb

## 2021-02-17 DIAGNOSIS — C252 Malignant neoplasm of tail of pancreas: Secondary | ICD-10-CM

## 2021-02-17 DIAGNOSIS — Z5111 Encounter for antineoplastic chemotherapy: Secondary | ICD-10-CM | POA: Diagnosis not present

## 2021-02-17 LAB — CBC WITH DIFFERENTIAL (CANCER CENTER ONLY)
Abs Immature Granulocytes: 0.1 10*3/uL — ABNORMAL HIGH (ref 0.00–0.07)
Basophils Absolute: 0.1 10*3/uL (ref 0.0–0.1)
Basophils Relative: 1 %
Eosinophils Absolute: 0.3 10*3/uL (ref 0.0–0.5)
Eosinophils Relative: 3 %
HCT: 36.4 % (ref 36.0–46.0)
Hemoglobin: 11.3 g/dL — ABNORMAL LOW (ref 12.0–15.0)
Immature Granulocytes: 1 %
Lymphocytes Relative: 33 %
Lymphs Abs: 2.7 10*3/uL (ref 0.7–4.0)
MCH: 30.1 pg (ref 26.0–34.0)
MCHC: 31 g/dL (ref 30.0–36.0)
MCV: 97.1 fL (ref 80.0–100.0)
Monocytes Absolute: 1 10*3/uL (ref 0.1–1.0)
Monocytes Relative: 12 %
Neutro Abs: 4.2 10*3/uL (ref 1.7–7.7)
Neutrophils Relative %: 50 %
Platelet Count: 243 10*3/uL (ref 150–400)
RBC: 3.75 MIL/uL — ABNORMAL LOW (ref 3.87–5.11)
RDW: 15.4 % (ref 11.5–15.5)
WBC Count: 8.3 10*3/uL (ref 4.0–10.5)
nRBC: 0 % (ref 0.0–0.2)

## 2021-02-17 LAB — CMP (CANCER CENTER ONLY)
ALT: 22 U/L (ref 0–44)
AST: 28 U/L (ref 15–41)
Albumin: 3.7 g/dL (ref 3.5–5.0)
Alkaline Phosphatase: 226 U/L — ABNORMAL HIGH (ref 38–126)
Anion gap: 7 (ref 5–15)
BUN: 10 mg/dL (ref 8–23)
CO2: 30 mmol/L (ref 22–32)
Calcium: 10.1 mg/dL (ref 8.9–10.3)
Chloride: 105 mmol/L (ref 98–111)
Creatinine: 0.89 mg/dL (ref 0.44–1.00)
GFR, Estimated: >60 mL/min (ref >60)
Glucose, Bld: 110 mg/dL — ABNORMAL HIGH (ref 70–99)
Potassium: 3.9 mmol/L (ref 3.5–5.1)
Sodium: 142 mmol/L (ref 135–145)
Total Bilirubin: 0.3 mg/dL (ref 0.3–1.2)
Total Protein: 6.8 g/dL (ref 6.5–8.1)

## 2021-02-17 LAB — MAGNESIUM: Magnesium: 1.9 mg/dL (ref 1.7–2.4)

## 2021-02-17 MED ORDER — SODIUM CHLORIDE 0.9 % IV SOLN
125.0000 mg/m2 | Freq: Once | INTRAVENOUS | Status: AC
  Administered 2021-02-17: 11:00:00 200 mg via INTRAVENOUS
  Filled 2021-02-17: qty 10

## 2021-02-17 MED ORDER — PALONOSETRON HCL INJECTION 0.25 MG/5ML
0.2500 mg | Freq: Once | INTRAVENOUS | Status: AC
  Administered 2021-02-17: 10:00:00 0.25 mg via INTRAVENOUS
  Filled 2021-02-17: qty 5

## 2021-02-17 MED ORDER — SODIUM CHLORIDE 0.9 % IV SOLN
400.0000 mg/m2 | Freq: Once | INTRAVENOUS | Status: AC
  Administered 2021-02-17: 11:00:00 644 mg via INTRAVENOUS
  Filled 2021-02-17: qty 32.2

## 2021-02-17 MED ORDER — SODIUM CHLORIDE 0.9 % IV SOLN
150.0000 mg | Freq: Once | INTRAVENOUS | Status: AC
  Administered 2021-02-17: 10:00:00 150 mg via INTRAVENOUS
  Filled 2021-02-17: qty 5

## 2021-02-17 MED ORDER — SODIUM CHLORIDE 0.9 % IV SOLN
10.0000 mg | Freq: Once | INTRAVENOUS | Status: AC
  Administered 2021-02-17: 10:00:00 10 mg via INTRAVENOUS
  Filled 2021-02-17: qty 1

## 2021-02-17 MED ORDER — ATROPINE SULFATE 1 MG/ML IV SOLN
0.5000 mg | Freq: Once | INTRAVENOUS | Status: AC | PRN
  Administered 2021-02-17: 11:00:00 0.5 mg via INTRAVENOUS
  Filled 2021-02-17: qty 1

## 2021-02-17 MED ORDER — SODIUM CHLORIDE 0.9 % IV SOLN
2400.0000 mg/m2 | INTRAVENOUS | Status: DC
  Administered 2021-02-17: 13:00:00 3850 mg via INTRAVENOUS
  Filled 2021-02-17: qty 77

## 2021-02-17 MED ORDER — SODIUM CHLORIDE 0.9% FLUSH: 10.0000 mL | INTRAVENOUS | Status: DC | PRN

## 2021-02-17 MED ORDER — HEPARIN SOD (PORK) LOCK FLUSH 100 UNIT/ML IV SOLN: 500.0000 [IU] | Freq: Once | INTRAVENOUS | Status: DC | PRN

## 2021-02-17 MED ORDER — SODIUM CHLORIDE 0.9 % IV SOLN: INTRAVENOUS | Status: DC

## 2021-02-17 NOTE — Progress Notes (Signed)
°  [facility] OFFICE PROGRESS NOTE   Diagnosis: Pancreas cancer  INTERVAL HISTORY:   Kirsten [name] returns as scheduled.  She completed another cycle of FOLFIRI 02/03/2021.  She denies nausea/vomiting.  No mouth sores.  No diarrhea.  No abdominal pain.  Objective:  Vital signs in last 24 hours:  Blood pressure (!) 161/83, pulse 96, temperature 98.1 F (36.7 C), temperature source Oral, resp. rate 20, height 5\' 3"  (1.6 m), weight 133 lb (60.3 kg), SpO2 100 %.    HEENT: No thrush or ulcers. Resp: Lungs clear bilaterally. Cardio: Regular rate and rhythm. GI: Abdomen soft and nontender.  No hepatomegaly.  No mass. Vascular: No leg edema.  Calves soft and nontender. Skin: Palms without erythema. Port-A-Cath without erythema.  Lab Results:  Lab Results  Component Value Date   WBC 8.3 02/17/2021   HGB 11.3 (L) 02/17/2021   HCT 36.4 02/17/2021   MCV 97.1 02/17/2021   PLT 243 02/17/2021   NEUTROABS 4.2 02/17/2021    Imaging:  No results found.  Medications: I have reviewed the patient's current medications.  Assessment/Plan: Metastatic pancreas cancer Right upper quadrant ultrasound 06/17/2020-multiple hypoechoic liver lesions CT abdomen/pelvis 06/17/2020- pancreas tail mass, multiple liver metastases, enlarged periaortic nodes, right ovary metastasis?,  Nodularity at the left paracolic gutter and posterior/inferior stomach suggesting peritoneal tumor spread CT chest 06/17/2020- scattered small pulmonary nodules consistent with metastatic disease Ultrasound-guided biopsy of a left liver mass 06/18/2020- adenocarcinoma, morphology compatible with metastatic pancreatic adenocarcinoma Cycle 1 FOLFOX 07/01/2020 Cycle 2 FOLFOX 07/16/2020 Cycle 3 FOLFOX 07/31/2020 Cycle 4 FOLFIRINOX 08/13/2020 Cycle 5 FOLFIRINOX 09/03/2020, Udenyca CTs 09/13/2020-unchanged nodule medial left lower lobe; stable nodal and likely peritoneal disease in the abdomen/pelvis; unchanged pancreatic tail  mass; multifocal hepatic metastases, mildly progressive Cycle 6 FOLFIRINOX 09/16/2020, Oxaliplatin held Cycle 7 FOLFIRINOX 09/30/2020 Cycle 8 FOLFIRINOX 10/14/2020, oxaliplatin held Cycle 9 FOLFIRINOX 10/28/2020, oxaliplatin held Cycle 10 FOLFIRINOX 11/11/2020, oxaliplatin held Cycle 11 FOLFIRINOX 11/25/2020, oxaliplatin held CTs 12/06/2020-primary pancreatic tail tumor decreased in size.  Widespread liver metastases all decreased.  Previously visualized peritoneal nodule adjacent to the distal stomach resolved.  Left lower lobe pulmonary nodule stable.  No new or progressive metastatic disease. Cycle 12 FOLFIRI 12/09/2020 Cycle 13 FOLFIRI 12/23/2020 Cycle 14 FOLFIRI 01/06/2021 Cycle 15 FOLFIRI 01/20/2021 Cycle 16 FOLFIRI 02/03/2021 Cycle 17 FOLFIRI 02/17/2021 2.  Pain secondary to #1-improved 3.  Weight loss-improved 4.  Family history of pancreas cancer 5.  Neutropenia secondary chemotherapy-G-CSF added with cycle 5 FOLFIRINOX 6.  Oxaliplatin neuropathy-oxaliplatin held 09/16/2020, 10/14/2020, 10/28/2020, 11/11/2020, 11/25/2020    Disposition: Kirsten [name] appears well.  She has completed 16 cycles of FOLFIRI.  There is no clinical evidence of disease progression.  Plan to proceed with cycle 17 today as scheduled.  Restaging CTs prior to next office visit.  CBC and chemistry panel reviewed.  Labs adequate to proceed as above.  She will return for lab, follow-up, FOLFIRI in approximately 2 weeks.  She will contact the office in the interim with any problems.    Ned Card ANP/GNP-BC   02/17/2021  8:59 AM

## 2021-02-17 NOTE — Progress Notes (Signed)
Patient seen by Lisa Thomas NP today  Vitals are within treatment parameters.  Labs reviewed by Lisa Thomas NP and are within treatment parameters.  Per physician team, patient is ready for treatment and there are NO modifications to the treatment plan.     

## 2021-02-17 NOTE — Progress Notes (Signed)
Patient presents for treatment. RN assessment completed along with the following:  Labs/vitals reviewed - Yes, and within treatment parameters.   Weight within 10% of previous measurement - Yes Oncology Treatment Attestation completed for current therapy- Yes, on date 06/27/20 Informed consent completed and reflects current therapy/intent - Yes, on date 07/01/20             Provider progress note reviewed - Yes, today's provider note was reviewed. Treatment/Antibody/Supportive plan reviewed - Yes, and there are no adjustments needed for today's treatment. S&H and other orders reviewed - Yes, and there are no additional orders identified. Previous treatment date reviewed - Yes, and the appropriate amount of time has elapsed between treatments. Clinic Hand Off Received from - Ned Card, NP  Patient to proceed with treatment.

## 2021-02-17 NOTE — Patient Instructions (Signed)

## 2021-02-17 NOTE — Patient Instructions (Signed)
[facility]  Discharge Instructions: Thank you for choosing [facility] to provide your oncology and hematology care.   If you have a lab appointment with the [facility], please go directly to the [facility] and check in at the registration area.   Wear comfortable clothing and clothing appropriate for easy access to any Portacath or PICC line.   We strive to give you quality time with your provider. You may need to reschedule your appointment if you arrive late (15 or more minutes).  Arriving late affects you and other patients whose appointments are after yours.  Also, if you miss three or more appointments without notifying the office, you may be dismissed from the clinic at the providers discretion.      For prescription refill requests, have your pharmacy contact our office and allow 72 hours for refills to be completed.    Today you received the following chemotherapy and/or immunotherapy agents Irimptecan, Leucovorin, 49f  Irinotecan injection What is this medication? IRINOTECAN (ir in oh TEE kan ) is a chemotherapy drug. It is used to treat colon and rectal cancer. This medicine may be used for other purposes; ask your health care provider or pharmacist if you have questions. COMMON BRAND NAME(S): Camptosar What should I tell my care team before I take this medication? They need to know if you have any of these conditions: dehydration diarrhea infection (especially a virus infection such as chickenpox, cold sores, or herpes) liver disease low blood counts, like low white cell, platelet, or red cell counts low levels of calcium, magnesium, or potassium in the blood recent or ongoing radiation therapy an unusual or allergic reaction to irinotecan, other medicines, foods, dyes, or preservatives pregnant or trying to get pregnant breast-feeding How should I use this medication? This drug is given as an infusion into a vein. It is  administered in a hospital or clinic by a specially trained health care professional. Talk to your pediatrician regarding the use of this medicine in children. Special care may be needed. Overdosage: If you think you have taken too much of this medicine contact a poison control center or emergency room at once. NOTE: This medicine is only for you. Do not share this medicine with others. What if I miss a dose? It is important not to miss your dose. Call your doctor or health care professional if you are unable to keep an appointment. What may interact with this medication? Do not take this medicine with any of the following medications: cobicistat itraconazole This medicine may interact with the following medications: antiviral medicines for HIV or AIDS certain antibiotics like rifampin or rifabutin certain medicines for fungal infections like ketoconazole, posaconazole, and voriconazole certain medicines for seizures like carbamazepine, phenobarbital, phenotoin clarithromycin gemfibrozil nefazodone [facility]. John's Wort This list may not describe all possible interactions. Give your health care provider a list of all the medicines, herbs, non-prescription drugs, or dietary supplements you use. Also tell them if you smoke, drink alcohol, or use illegal drugs. Some items may interact with your medicine. What should I watch for while using this medication? Your condition will be monitored carefully while you are receiving this medicine. You will need important blood work done while you are taking this medicine. This drug may make you feel generally unwell. This is not uncommon, as chemotherapy can affect healthy cells as well as cancer cells. Report any side effects. Continue your course of treatment even though you feel ill  unless your doctor tells you to stop. In some cases, you may be given additional medicines to help with side effects. Follow all directions for their use. You may get drowsy or  dizzy. Do not drive, use machinery, or do anything that needs mental alertness until you know how this medicine affects you. Do not stand or sit up quickly, especially if you are an older patient. This reduces the risk of dizzy or fainting spells. Call your health care professional for advice if you get a fever, chills, or sore throat, or other symptoms of a cold or flu. Do not treat yourself. This medicine decreases your body's ability to fight infections. Try to avoid being around people who are sick. Avoid taking products that contain aspirin, acetaminophen, ibuprofen, naproxen, or ketoprofen unless instructed by your doctor. These medicines may hide a fever. This medicine may increase your risk to bruise or bleed. Call your doctor or health care professional if you notice any unusual bleeding. Be careful brushing and flossing your teeth or using a toothpick because you may get an infection or bleed more easily. If you have any dental work done, tell your dentist you are receiving this medicine. Do not become pregnant while taking this medicine or for 6 months after stopping it. Women should inform their health care professional if they wish to become pregnant or think they might be pregnant. Men should not father a child while taking this medicine and for 3 months after stopping it. There is potential for serious side effects to an unborn child. Talk to your health care professional for more information. Do not breast-feed an infant while taking this medicine or for 7 days after stopping it. This medicine has caused ovarian failure in some women. This medicine may make it more difficult to get pregnant. Talk to your health care professional if you are concerned about your fertility. This medicine has caused decreased sperm counts in some men. This may make it more difficult to father a child. Talk to your health care professional if you are concerned about your fertility. What side effects may I notice  from receiving this medication? Side effects that you should report to your doctor or health care professional as soon as possible: allergic reactions like skin rash, itching or hives, swelling of the face, lips, or tongue chest pain diarrhea flushing, runny nose, sweating during infusion low blood counts - this medicine may decrease the number of white blood cells, red blood cells and platelets. You may be at increased risk for infections and bleeding. nausea, vomiting pain, swelling, warmth in the leg signs of decreased platelets or bleeding - bruising, pinpoint red spots on the skin, black, tarry stools, blood in the urine signs of infection - fever or chills, cough, sore throat, pain or difficulty passing urine signs of decreased red blood cells - unusually weak or tired, fainting spells, lightheadedness Side effects that usually do not require medical attention (report to your doctor or health care professional if they continue or are bothersome): constipation hair loss headache loss of appetite mouth sores stomach pain This list may not describe all possible side effects. Call your doctor for medical advice about side effects. You may report side effects to FDA at 1-800-FDA-1088. Where should I keep my medication? This drug is given in a hospital or clinic and will not be stored at home. NOTE: This sheet is a summary. It may not cover all possible information. If you have questions about this medicine, talk to  your doctor, pharmacist, or health care provider.  2022 Elsevier/Gold Standard (2020-11-05 00:00:00)  Leucovorin injection What is this medication? LEUCOVORIN (loo koe VOR in) is used to prevent or treat the harmful effects of some medicines. This medicine is used to treat anemia caused by a low amount of folic acid in the body. It is also used with 5-fluorouracil (5-FU) to treat colon cancer. This medicine may be used for other purposes; ask your health care provider or  pharmacist if you have questions. What should I tell my care team before I take this medication? They need to know if you have any of these conditions: anemia from low levels of vitamin B-12 in the blood an unusual or allergic reaction to leucovorin, folic acid, other medicines, foods, dyes, or preservatives pregnant or trying to get pregnant breast-feeding How should I use this medication? This medicine is for injection into a muscle or into a vein. It is given by a health care professional in a hospital or clinic setting. Talk to your pediatrician regarding the use of this medicine in children. Special care may be needed. Overdosage: If you think you have taken too much of this medicine contact a poison control center or emergency room at once. NOTE: This medicine is only for you. Do not share this medicine with others. What if I miss a dose? This does not apply. What may interact with this medication? capecitabine fluorouracil phenobarbital phenytoin primidone trimethoprim-sulfamethoxazole This list may not describe all possible interactions. Give your health care provider a list of all the medicines, herbs, non-prescription drugs, or dietary supplements you use. Also tell them if you smoke, drink alcohol, or use illegal drugs. Some items may interact with your medicine. What should I watch for while using this medication? Your condition will be monitored carefully while you are receiving this medicine. This medicine may increase the side effects of 5-fluorouracil, 5-FU. Tell your doctor or health care professional if you have diarrhea or mouth sores that do not get better or that get worse. What side effects may I notice from receiving this medication? Side effects that you should report to your doctor or health care professional as soon as possible: allergic reactions like skin rash, itching or hives, swelling of the face, lips, or tongue breathing problems fever, infection mouth  sores unusual bleeding or bruising unusually weak or tired Side effects that usually do not require medical attention (report to your doctor or health care professional if they continue or are bothersome): constipation or diarrhea loss of appetite nausea, vomiting This list may not describe all possible side effects. Call your doctor for medical advice about side effects. You may report side effects to FDA at 1-800-FDA-1088. Where should I keep my medication? This drug is given in a hospital or clinic and will not be stored at home. NOTE: This sheet is a summary. It may not cover all possible information. If you have questions about this medicine, talk to your doctor, pharmacist, or health care provider.  2022 Elsevier/Gold Standard (2007-08-25 00:00:00)  Fluorouracil, 5-FU injection What is this medication? FLUOROURACIL, 5-FU (flure oh YOOR a sil) is a chemotherapy drug. It slows the growth of cancer cells. This medicine is used to treat many types of cancer like breast cancer, colon or rectal cancer, pancreatic cancer, and stomach cancer. This medicine may be used for other purposes; ask your health care provider or pharmacist if you have questions. COMMON BRAND NAME(S): Adrucil What should I tell my care team before I  take this medication? They need to know if you have any of these conditions: blood disorders dihydropyrimidine dehydrogenase (DPD) deficiency infection (especially a virus infection such as chickenpox, cold sores, or herpes) kidney disease liver disease malnourished, poor nutrition recent or ongoing radiation therapy an unusual or allergic reaction to fluorouracil, other chemotherapy, other medicines, foods, dyes, or preservatives pregnant or trying to get pregnant breast-feeding How should I use this medication? This drug is given as an infusion or injection into a vein. It is administered in a hospital or clinic by a specially trained health care professional. Talk  to your pediatrician regarding the use of this medicine in children. Special care may be needed. Overdosage: If you think you have taken too much of this medicine contact a poison control center or emergency room at once. NOTE: This medicine is only for you. Do not share this medicine with others. What if I miss a dose? It is important not to miss your dose. Call your doctor or health care professional if you are unable to keep an appointment. What may interact with this medication? Do not take this medicine with any of the following medications: live virus vaccines This medicine may also interact with the following medications: medicines that treat or prevent blood clots like warfarin, enoxaparin, and dalteparin This list may not describe all possible interactions. Give your health care provider a list of all the medicines, herbs, non-prescription drugs, or dietary supplements you use. Also tell them if you smoke, drink alcohol, or use illegal drugs. Some items may interact with your medicine. What should I watch for while using this medication? Visit your doctor for checks on your progress. This drug may make you feel generally unwell. This is not uncommon, as chemotherapy can affect healthy cells as well as cancer cells. Report any side effects. Continue your course of treatment even though you feel ill unless your doctor tells you to stop. In some cases, you may be given additional medicines to help with side effects. Follow all directions for their use. Call your doctor or health care professional for advice if you get a fever, chills or sore throat, or other symptoms of a cold or flu. Do not treat yourself. This drug decreases your body's ability to fight infections. Try to avoid being around people who are sick. This medicine may increase your risk to bruise or bleed. Call your doctor or health care professional if you notice any unusual bleeding. Be careful brushing and flossing your teeth or  using a toothpick because you may get an infection or bleed more easily. If you have any dental work done, tell your dentist you are receiving this medicine. Avoid taking products that contain aspirin, acetaminophen, ibuprofen, naproxen, or ketoprofen unless instructed by your doctor. These medicines may hide a fever. Do not become pregnant while taking this medicine. Women should inform their doctor if they wish to become pregnant or think they might be pregnant. There is a potential for serious side effects to an unborn child. Talk to your health care professional or pharmacist for more information. Do not breast-feed an infant while taking this medicine. Men should inform their doctor if they wish to father a child. This medicine may lower sperm counts. Do not treat diarrhea with over the counter products. Contact your doctor if you have diarrhea that lasts more than 2 days or if it is severe and watery. This medicine can make you more sensitive to the sun. Keep out of the sun. If  you cannot avoid being in the sun, wear protective clothing and use sunscreen. Do not use sun lamps or tanning beds/booths. What side effects may I notice from receiving this medication? Side effects that you should report to your doctor or health care professional as soon as possible: allergic reactions like skin rash, itching or hives, swelling of the face, lips, or tongue low blood counts - this medicine may decrease the number of white blood cells, red blood cells and platelets. You may be at increased risk for infections and bleeding. signs of infection - fever or chills, cough, sore throat, pain or difficulty passing urine signs of decreased platelets or bleeding - bruising, pinpoint red spots on the skin, black, tarry stools, blood in the urine signs of decreased red blood cells - unusually weak or tired, fainting spells, lightheadedness breathing problems changes in vision chest pain mouth sores nausea and  vomiting pain, swelling, redness at site where injected pain, tingling, numbness in the hands or feet redness, swelling, or sores on hands or feet stomach pain unusual bleeding Side effects that usually do not require medical attention (report to your doctor or health care professional if they continue or are bothersome): changes in finger or toe nails diarrhea dry or itchy skin hair loss headache loss of appetite sensitivity of eyes to the light stomach upset unusually teary eyes This list may not describe all possible side effects. Call your doctor for medical advice about side effects. You may report side effects to FDA at 1-800-FDA-1088. Where should I keep my medication? This drug is given in a hospital or clinic and will not be stored at home. NOTE: This sheet is a summary. It may not cover all possible information. If you have questions about this medicine, talk to your doctor, pharmacist, or health care provider.  2022 Elsevier/Gold Standard (2020-11-05 00:00:00)           To help prevent nausea and vomiting after your treatment, we encourage you to take your nausea medication as directed.  BELOW ARE SYMPTOMS THAT SHOULD BE REPORTED IMMEDIATELY: *FEVER GREATER THAN 100.4 F (38 C) OR HIGHER *CHILLS OR SWEATING *NAUSEA AND VOMITING THAT IS NOT CONTROLLED WITH YOUR NAUSEA MEDICATION *UNUSUAL SHORTNESS OF BREATH *UNUSUAL BRUISING OR BLEEDING *URINARY PROBLEMS (pain or burning when urinating, or frequent urination) *BOWEL PROBLEMS (unusual diarrhea, constipation, pain near the anus) TENDERNESS IN MOUTH AND THROAT WITH OR WITHOUT PRESENCE OF ULCERS (sore throat, sores in mouth, or a toothache) UNUSUAL RASH, SWELLING OR PAIN  UNUSUAL VAGINAL DISCHARGE OR ITCHING   Items with * indicate a potential emergency and should be followed up as soon as possible or go to the Emergency Department if any problems should occur.  Please show the CHEMOTHERAPY ALERT CARD or  IMMUNOTHERAPY ALERT CARD at check-in to the Emergency Department and triage nurse.  Should you have questions after your visit or need to cancel or reschedule your appointment, please contact [facility]  Dept: [phone]  and follow the prompts.  Office hours are 8:00 a.m. to 4:30 p.m. Monday - Friday. Please note that voicemails left after 4:00 p.m. may not be returned until the following business day.  We are closed weekends and major holidays. You have access to a nurse at all times for urgent questions. Please call the main number to the clinic Dept: [phone] and follow the prompts.   For any non-urgent questions, you may also contact your provider using MyChart. We now offer e-Visits for anyone  18 and older to request care online for non-urgent symptoms. For details visit [email or web address].   Also download the MyChart app! Go to the app store, search "MyChart", open the app, select [facility], and log in with your MyChart username and password.  Due to Covid, a mask is required upon entering the hospital/clinic. If you do not have a mask, one will be given to you upon arrival. For doctor visits, patients may have 1 support person aged 35 or older with them. For treatment visits, patients cannot have anyone with them due to current Covid guidelines and our immunocompromised population.   The chemotherapy medication bag should finish at 46 hours, 96 hours, or 7 days. For example, if your pump is scheduled for 46 hours and it was put on at 4:00 p.m., it should finish at 2:00 p.m. the day it is scheduled to come off regardless of your appointment time.     Estimated time to finish at 1030am on Wednesday 02/19/21.   If the display on your pump reads "Low Volume" and it is beeping, take the batteries out of the pump and come to the cancer center for it to be taken off.   If the pump alarms go off prior to the pump reading "Low Volume" then call [phone]  and someone can assist you.  If the plunger comes out and the chemotherapy medication is leaking out, please use your home chemo spill kit to clean up the spill. Do NOT use paper towels or other household products.  If you have problems or questions regarding your pump, please call either [phone] (24 hours a day) or the cancer center Monday-Friday 8:00 a.m.- 4:30 p.m. at the clinic number and we will assist you. If you are unable to get assistance, then go to the nearest Emergency Department and ask the staff to contact the IV team for assistance.

## 2021-02-18 LAB — CANCER ANTIGEN 19-9: CA 19-9: 3027 U/mL — ABNORMAL HIGH (ref 0–35)

## 2021-02-19 ENCOUNTER — Inpatient Hospital Stay: Payer: Medicaid Other

## 2021-02-19 ENCOUNTER — Other Ambulatory Visit: Payer: Self-pay

## 2021-02-19 VITALS — BP 144/81 | HR 84 | Temp 98.4°F | Resp 18

## 2021-02-19 DIAGNOSIS — C252 Malignant neoplasm of tail of pancreas: Secondary | ICD-10-CM

## 2021-02-19 DIAGNOSIS — Z5111 Encounter for antineoplastic chemotherapy: Secondary | ICD-10-CM | POA: Diagnosis not present

## 2021-02-19 MED ORDER — SODIUM CHLORIDE 0.9% FLUSH
10.0000 mL | INTRAVENOUS | Status: DC | PRN
  Administered 2021-02-19: 10:00:00 10 mL

## 2021-02-19 MED ORDER — HEPARIN SOD (PORK) LOCK FLUSH 100 UNIT/ML IV SOLN
500.0000 [IU] | Freq: Once | INTRAVENOUS | Status: AC | PRN
  Administered 2021-02-19: 10:00:00 500 [IU]

## 2021-02-19 MED ORDER — PEGFILGRASTIM-BMEZ 6 MG/0.6ML ~~LOC~~ SOSY
6.0000 mg | PREFILLED_SYRINGE | Freq: Once | SUBCUTANEOUS | Status: AC
  Administered 2021-02-19: 10:00:00 6 mg via SUBCUTANEOUS

## 2021-02-19 NOTE — Patient Instructions (Signed)
Kirsten [name]   Discharge Instructions: Thank you for choosing [facility] to provide your oncology and hematology care.   If you have a lab appointment with the [facility], please go directly to the [facility] and check in at the registration area.   Wear comfortable clothing and clothing appropriate for easy access to any Portacath or PICC line.   We strive to give you quality time with your provider. You may need to reschedule your appointment if you arrive late (15 or more minutes).  Arriving late affects you and other patients whose appointments are after yours.  Also, if you miss three or more appointments without notifying the office, you may be dismissed from the clinic at the providers discretion.      For prescription refill requests, have your pharmacy contact our office and allow 72 hours for refills to be completed.    Today you received the following Ziextenzo   To help prevent nausea and vomiting after your treatment, we encourage you to take your nausea medication as directed.  BELOW ARE SYMPTOMS THAT SHOULD BE REPORTED IMMEDIATELY: *FEVER GREATER THAN 100.4 F (38 C) OR HIGHER *CHILLS OR SWEATING *NAUSEA AND VOMITING THAT IS NOT CONTROLLED WITH YOUR NAUSEA MEDICATION *UNUSUAL SHORTNESS OF BREATH *UNUSUAL BRUISING OR BLEEDING *URINARY PROBLEMS (pain or burning when urinating, or frequent urination) *BOWEL PROBLEMS (unusual diarrhea, constipation, pain near the anus) TENDERNESS IN MOUTH AND THROAT WITH OR WITHOUT PRESENCE OF ULCERS (sore throat, sores in mouth, or a toothache) UNUSUAL RASH, SWELLING OR PAIN  UNUSUAL VAGINAL DISCHARGE OR ITCHING   Items with * indicate a potential emergency and should be followed up as soon as possible or go to the Emergency Department if any problems should occur.  Please show the CHEMOTHERAPY ALERT CARD or IMMUNOTHERAPY ALERT CARD at check-in to the Emergency Department and triage  nurse.  Should you have questions after your visit or need to cancel or reschedule your appointment, please contact [facility]  Dept: [phone]  and follow the prompts.  Office hours are 8:00 a.m. to 4:30 p.m. Monday - Friday. Please note that voicemails left after 4:00 p.m. may not be returned until the following business day.  We are closed weekends and major holidays. You have access to a nurse at all times for urgent questions. Please call the main number to the clinic Dept: [phone] and follow the prompts.   For any non-urgent questions, you may also contact your provider using MyChart. We now offer e-Visits for anyone 11 and older to request care online for non-urgent symptoms. For details visit [email or web address].   Also download the MyChart app! Go to the app store, search "MyChart", open the app, select [facility], and log in with your MyChart username and password.  Due to Covid, a mask is required upon entering the hospital/clinic. If you do not have a mask, one will be given to you upon arrival. For doctor visits, patients may have 1 support person aged 54 or older with them. For treatment visits, patients cannot have anyone with them due to current Covid guidelines and our immunocompromised population.   Pegfilgrastim Injection What is this medication? PEGFILGRASTIM (PEG fil gra stim) lowers the risk of infection in people who are receiving chemotherapy. It works by Building control surveyor make more white blood cells, which protects your body from infection. It may also be used to help people who have been exposed to high doses of  radiation. This medicine may be used for other purposes; ask your health care provider or pharmacist if you have questions. COMMON BRAND NAME(S): Rexene Edison, Ziextenzo What should I tell my care team before I take this medication? They need to know if you have any of these conditions: Kidney  disease Latex allergy Ongoing radiation therapy Sickle cell disease Skin reactions to acrylic adhesives (On-Body Injector only) An unusual or allergic reaction to pegfilgrastim, filgrastim, other medications, foods, dyes, or preservatives Pregnant or trying to get pregnant Breast-feeding How should I use this medication? This medication is for injection under the skin. If you get this medication at home, you will be taught how to prepare and give the pre-filled syringe or how to use the On-body Injector. Refer to the patient Instructions for Use for detailed instructions. Use exactly as directed. Tell your care team immediately if you suspect that the On-body Injector may not have performed as intended or if you suspect the use of the On-body Injector resulted in a missed or partial dose. It is important that you put your used needles and syringes in a special sharps container. Do not put them in a trash can. If you do not have a sharps container, call your pharmacist or care team to get one. Talk to your care team about the use of this medication in children. While this medication may be prescribed for selected conditions, precautions do apply. Overdosage: If you think you have taken too much of this medicine contact a poison control center or emergency room at once. NOTE: This medicine is only for you. Do not share this medicine with others. What if I miss a dose? It is important not to miss your dose. Call your care team if you miss your dose. If you miss a dose due to an On-body Injector failure or leakage, a new dose should be administered as soon as possible using a single prefilled syringe for manual use. What may interact with this medication? Interactions have not been studied. This list may not describe all possible interactions. Give your health care provider a list of all the medicines, herbs, non-prescription drugs, or dietary supplements you use. Also tell them if you smoke, drink  alcohol, or use illegal drugs. Some items may interact with your medicine. What should I watch for while using this medication? Your condition will be monitored carefully while you are receiving this medication. You may need blood work done while you are taking this medication. Talk to your care team about your risk of cancer. You may be more at risk for certain types of cancer if you take this medication. If you are going to need a MRI, CT scan, or other procedure, tell your care team that you are using this medication (On-Body Injector only). What side effects may I notice from receiving this medication? Side effects that you should report to your care team as soon as possible: Allergic reactions--skin rash, itching, hives, swelling of the face, lips, tongue, or throat Capillary leak syndrome--stomach or muscle pain, unusual weakness or fatigue, feeling faint or lightheaded, decrease in the amount of urine, swelling of the ankles, hands, or feet, trouble breathing High white blood cell level--fever, fatigue, trouble breathing, night sweats, change in vision, weight loss Inflammation of the aorta--fever, fatigue, back, chest, or stomach pain, severe headache Kidney injury (glomerulonephritis)--decrease in the amount of urine, red or dark brown urine, foamy or bubbly urine, swelling of the ankles, hands, or feet Shortness of breath or  trouble breathing Spleen injury--pain in upper left stomach or shoulder Unusual bruising or bleeding Side effects that usually do not require medical attention (report to your care team if they continue or are bothersome): Bone pain Pain in the hands or feet This list may not describe all possible side effects. Call your doctor for medical advice about side effects. You may report side effects to FDA at 1-800-FDA-1088. Where should I keep my medication? Keep out of the reach of children. If you are using this medication at home, you will be instructed on how to  store it. Throw away any unused medication after the expiration date on the label. NOTE: This sheet is a summary. It may not cover all possible information. If you have questions about this medicine, talk to your doctor, pharmacist, or health care provider.  2022 Elsevier/Gold Standard (2020-11-05 00:00:00)

## 2021-02-27 ENCOUNTER — Encounter (HOSPITAL_BASED_OUTPATIENT_CLINIC_OR_DEPARTMENT_OTHER): Payer: Self-pay

## 2021-02-27 ENCOUNTER — Other Ambulatory Visit: Payer: Self-pay

## 2021-02-27 ENCOUNTER — Ambulatory Visit (HOSPITAL_BASED_OUTPATIENT_CLINIC_OR_DEPARTMENT_OTHER)
Admission: RE | Admit: 2021-02-27 | Discharge: 2021-02-27 | Disposition: A | Discharge status: Home / Self Care | Payer: Medicaid Other | Source: Ambulatory Visit | Attending: Nurse Practitioner | Admitting: Nurse Practitioner

## 2021-02-27 DIAGNOSIS — C252 Malignant neoplasm of tail of pancreas: Secondary | ICD-10-CM | POA: Insufficient documentation

## 2021-02-27 MED ORDER — IOHEXOL 300 MG/ML  SOLN
80.0000 mL | Freq: Once | INTRAMUSCULAR | Status: AC | PRN
  Administered 2021-02-27: 11:00:00 80 mL via INTRAVENOUS

## 2021-02-27 MED ORDER — HEPARIN SOD (PORK) LOCK FLUSH 100 UNIT/ML IV SOLN
500.0000 [IU] | Freq: Once | INTRAVENOUS | Status: AC
  Administered 2021-02-27: 11:00:00 500 [IU] via INTRAVENOUS

## 2021-02-27 MED ORDER — SODIUM CHLORIDE 0.9% FLUSH
10.0000 mL | INTRAVENOUS | Status: DC | PRN
  Filled 2021-02-27: qty 10

## 2021-03-03 ENCOUNTER — Other Ambulatory Visit: Payer: Self-pay | Admitting: Oncology

## 2021-03-04 ENCOUNTER — Inpatient Hospital Stay: Payer: Medicaid Other

## 2021-03-04 ENCOUNTER — Other Ambulatory Visit: Payer: Self-pay

## 2021-03-04 ENCOUNTER — Telehealth: Payer: Self-pay

## 2021-03-04 ENCOUNTER — Inpatient Hospital Stay (HOSPITAL_BASED_OUTPATIENT_CLINIC_OR_DEPARTMENT_OTHER): Payer: Medicaid Other | Admitting: Oncology

## 2021-03-04 ENCOUNTER — Inpatient Hospital Stay: Payer: Medicaid Other | Attending: Oncology

## 2021-03-04 VITALS — BP 137/77 | HR 89

## 2021-03-04 VITALS — BP 154/85 | HR 101 | Temp 97.8°F | Resp 20 | Ht 63.0 in | Wt 133.6 lb

## 2021-03-04 DIAGNOSIS — C787 Secondary malignant neoplasm of liver and intrahepatic bile duct: Secondary | ICD-10-CM | POA: Insufficient documentation

## 2021-03-04 DIAGNOSIS — C252 Malignant neoplasm of tail of pancreas: Secondary | ICD-10-CM

## 2021-03-04 DIAGNOSIS — Z5111 Encounter for antineoplastic chemotherapy: Secondary | ICD-10-CM | POA: Diagnosis present

## 2021-03-04 DIAGNOSIS — D701 Agranulocytosis secondary to cancer chemotherapy: Secondary | ICD-10-CM | POA: Diagnosis not present

## 2021-03-04 DIAGNOSIS — C259 Malignant neoplasm of pancreas, unspecified: Secondary | ICD-10-CM

## 2021-03-04 DIAGNOSIS — G893 Neoplasm related pain (acute) (chronic): Secondary | ICD-10-CM | POA: Diagnosis not present

## 2021-03-04 DIAGNOSIS — T451X5A Adverse effect of antineoplastic and immunosuppressive drugs, initial encounter: Secondary | ICD-10-CM | POA: Insufficient documentation

## 2021-03-04 LAB — CMP (CANCER CENTER ONLY)
ALT: 30 U/L (ref 0–44)
AST: 31 U/L (ref 15–41)
Albumin: 3.7 g/dL (ref 3.5–5.0)
Alkaline Phosphatase: 260 U/L — ABNORMAL HIGH (ref 38–126)
Anion gap: 5 (ref 5–15)
BUN: 8 mg/dL (ref 8–23)
CO2: 30 mmol/L (ref 22–32)
Calcium: 9.8 mg/dL (ref 8.9–10.3)
Chloride: 105 mmol/L (ref 98–111)
Creatinine: 0.86 mg/dL (ref 0.44–1.00)
GFR, Estimated: >60 mL/min (ref >60)
Glucose, Bld: 107 mg/dL — ABNORMAL HIGH (ref 70–99)
Potassium: 3.8 mmol/L (ref 3.5–5.1)
Sodium: 140 mmol/L (ref 135–145)
Total Bilirubin: 0.3 mg/dL (ref 0.3–1.2)
Total Protein: 6.9 g/dL (ref 6.5–8.1)

## 2021-03-04 LAB — CBC WITH DIFFERENTIAL (CANCER CENTER ONLY)
Abs Immature Granulocytes: 0.04 10*3/uL (ref 0.00–0.07)
Basophils Absolute: 0.1 10*3/uL (ref 0.0–0.1)
Basophils Relative: 1 %
Eosinophils Absolute: 0.3 10*3/uL (ref 0.0–0.5)
Eosinophils Relative: 4 %
HCT: 36.6 % (ref 36.0–46.0)
Hemoglobin: 11.5 g/dL — ABNORMAL LOW (ref 12.0–15.0)
Immature Granulocytes: 1 %
Lymphocytes Relative: 34 %
Lymphs Abs: 2.7 10*3/uL (ref 0.7–4.0)
MCH: 29.9 pg (ref 26.0–34.0)
MCHC: 31.4 g/dL (ref 30.0–36.0)
MCV: 95.3 fL (ref 80.0–100.0)
Monocytes Absolute: 0.9 10*3/uL (ref 0.1–1.0)
Monocytes Relative: 11 %
Neutro Abs: 4.1 10*3/uL (ref 1.7–7.7)
Neutrophils Relative %: 49 %
Platelet Count: 252 10*3/uL (ref 150–400)
RBC: 3.84 MIL/uL — ABNORMAL LOW (ref 3.87–5.11)
RDW: 15.4 % (ref 11.5–15.5)
WBC Count: 8.2 10*3/uL (ref 4.0–10.5)
nRBC: 0 % (ref 0.0–0.2)

## 2021-03-04 LAB — MAGNESIUM: Magnesium: 1.9 mg/dL (ref 1.7–2.4)

## 2021-03-04 MED ORDER — SODIUM CHLORIDE 0.9 % IV SOLN: Freq: Once | INTRAVENOUS | Status: AC

## 2021-03-04 MED ORDER — LIDOCAINE-PRILOCAINE 2.5-2.5 % EX CREA: TOPICAL_CREAM | CUTANEOUS | 2 refills | Status: DC

## 2021-03-04 MED ORDER — SODIUM CHLORIDE 0.9 % IV SOLN
2400.0000 mg/m2 | INTRAVENOUS | Status: DC
  Administered 2021-03-04: 3850 mg via INTRAVENOUS
  Filled 2021-03-04: qty 77

## 2021-03-04 MED ORDER — PALONOSETRON HCL INJECTION 0.25 MG/5ML
0.2500 mg | Freq: Once | INTRAVENOUS | Status: AC
  Administered 2021-03-04: 0.25 mg via INTRAVENOUS
  Filled 2021-03-04: qty 5

## 2021-03-04 MED ORDER — SODIUM CHLORIDE 0.9 % IV SOLN
125.0000 mg/m2 | Freq: Once | INTRAVENOUS | Status: AC
  Administered 2021-03-04: 200 mg via INTRAVENOUS
  Filled 2021-03-04: qty 10

## 2021-03-04 MED ORDER — SODIUM CHLORIDE 0.9 % IV SOLN
400.0000 mg/m2 | Freq: Once | INTRAVENOUS | Status: AC
  Administered 2021-03-04: 644 mg via INTRAVENOUS
  Filled 2021-03-04: qty 32.2

## 2021-03-04 MED ORDER — SODIUM CHLORIDE 0.9 % IV SOLN: INTRAVENOUS | Status: DC

## 2021-03-04 MED ORDER — SODIUM CHLORIDE 0.9 % IV SOLN
10.0000 mg | Freq: Once | INTRAVENOUS | Status: AC
  Administered 2021-03-04: 10 mg via INTRAVENOUS
  Filled 2021-03-04: qty 1

## 2021-03-04 MED ORDER — ATROPINE SULFATE 1 MG/ML IV SOLN
0.5000 mg | Freq: Once | INTRAVENOUS | Status: AC | PRN
  Administered 2021-03-04: 0.5 mg via INTRAVENOUS
  Filled 2021-03-04: qty 1

## 2021-03-04 MED ORDER — SODIUM CHLORIDE 0.9 % IV SOLN
150.0000 mg | Freq: Once | INTRAVENOUS | Status: AC
  Administered 2021-03-04: 150 mg via INTRAVENOUS
  Filled 2021-03-04: qty 5

## 2021-03-04 NOTE — Progress Notes (Signed)
Patient presents for treatment. RN assessment completed along with the following:  Labs/vitals reviewed - Yes, and 101 HR noted. Ok to treat per Danae Orleans, LPN note    Weight within 10% of previous measurement - Yes Oncology Treatment Attestation completed for current therapy- Yes, on date 06/27/20 Informed consent completed and reflects current therapy/intent - Yes, on date 07/01/20             Provider progress note reviewed - Today's provider note is not yet available. I reviewed the most recent oncology provider progress note in chart dated 02/17/21. Treatment/Antibody/Supportive plan reviewed - Yes, and there are no adjustments needed for today's treatment. S&H and other orders reviewed - Yes, and there are no additional orders identified. Previous treatment date reviewed - Yes, and the appropriate amount of time has elapsed between treatments. Clinic Hand Off Received from - Danae Orleans, LPN  Patient to proceed with treatment.

## 2021-03-04 NOTE — Progress Notes (Signed)
[facility] OFFICE PROGRESS NOTE   Diagnosis: Pancreas cancer  INTERVAL HISTORY:   Ms. Koone completed another cycle of FOLFIRI on 02/17/2021.  No nausea/vomiting.  Intermittent mild diarrhea.  No abdominal pain.  She had abdominal bloating after the CTs on 02/27/2021.  Objective:  Vital signs in last 24 hours:  Blood pressure (!) 154/85, pulse (!) 101, temperature 97.8 F (36.6 C), temperature source Oral, resp. rate 20, height 5\' 3"  (1.6 m), weight 133 lb 9.6 oz (60.6 kg), SpO2 100 %.    HEENT: No thrush or ulcers Resp: Lungs clear bilaterally Cardio: Regular rate and rhythm GI: No hepatosplenomegaly, no mass, no apparent ascites Vascular: No leg edema  Skin: Hyperpigmentation of the hands  Portacath/PICC-without erythema  Lab Results:  Lab Results  Component Value Date   WBC 8.2 03/04/2021   HGB 11.5 (L) 03/04/2021   HCT 36.6 03/04/2021   MCV 95.3 03/04/2021   PLT 252 03/04/2021   NEUTROABS 4.1 03/04/2021    CMP  Lab Results  Component Value Date   NA 142 02/17/2021   K 3.9 02/17/2021   CL 105 02/17/2021   CO2 30 02/17/2021   GLUCOSE 110 (H) 02/17/2021   BUN 10 02/17/2021   CREATININE 0.89 02/17/2021   CALCIUM 10.1 02/17/2021   PROT 6.8 02/17/2021   ALBUMIN 3.7 02/17/2021   AST 28 02/17/2021   ALT 22 02/17/2021   ALKPHOS 226 (H) 02/17/2021   BILITOT 0.3 02/17/2021   GFRNONAA >60 02/17/2021    Lab Results  Component Value Date   CLE751 3,027 (H) 02/17/2021     Medications: I have reviewed the patient's current medications.   Assessment/Plan: Metastatic pancreas cancer Right upper quadrant ultrasound 06/17/2020-multiple hypoechoic liver lesions CT abdomen/pelvis 06/17/2020- pancreas tail mass, multiple liver metastases, enlarged periaortic nodes, right ovary metastasis?,  Nodularity at the left paracolic gutter and posterior/inferior stomach suggesting peritoneal tumor spread CT chest 06/17/2020- scattered small pulmonary nodules  consistent with metastatic disease Ultrasound-guided biopsy of a left liver mass 06/18/2020- adenocarcinoma, morphology compatible with metastatic pancreatic adenocarcinoma Cycle 1 FOLFOX 07/01/2020 Cycle 2 FOLFOX 07/16/2020 Cycle 3 FOLFOX 07/31/2020 Cycle 4 FOLFIRINOX 08/13/2020 Cycle 5 FOLFIRINOX 09/03/2020, Udenyca CTs 09/13/2020-unchanged nodule medial left lower lobe; stable nodal and likely peritoneal disease in the abdomen/pelvis; unchanged pancreatic tail mass; multifocal hepatic metastases, mildly progressive Cycle 6 FOLFIRINOX 09/16/2020, Oxaliplatin held Cycle 7 FOLFIRINOX 09/30/2020 Cycle 8 FOLFIRINOX 10/14/2020, oxaliplatin held Cycle 9 FOLFIRINOX 10/28/2020, oxaliplatin held Cycle 10 FOLFIRINOX 11/11/2020, oxaliplatin held Cycle 11 FOLFIRINOX 11/25/2020, oxaliplatin held CTs 12/06/2020-primary pancreatic tail tumor decreased in size.  Widespread liver metastases all decreased.  Previously visualized peritoneal nodule adjacent to the distal stomach resolved.  Left lower lobe pulmonary nodule stable.  No new or progressive metastatic disease. Cycle 12 FOLFIRI 12/09/2020 Cycle 13 FOLFIRI 12/23/2020 Cycle 14 FOLFIRI 01/06/2021 Cycle 15 FOLFIRI 01/20/2021 Cycle 16 FOLFIRI 02/03/2021 Cycle 17 FOLFIRI 02/17/2021 CTs 02/27/2021-slight decrease in size of pancreas tail mass and hypodense liver lesions, unchanged left lower lobe nodule, enlargement of a single peritoneal nodule anterior to the distal descending colon Cycle 18 FOLFIRI 03/04/2021 2.  Pain secondary to #1-improved 3.  Weight loss-improved 4.  Family history of pancreas cancer 5.  Neutropenia secondary chemotherapy-G-CSF added with cycle 5 FOLFIRINOX 6.  Oxaliplatin neuropathy-oxaliplatin held 09/16/2020, 10/14/2020, 10/28/2020, 11/11/2020, 11/25/2020      Disposition: Ms. Carneiro appears stable.  I reviewed the restaging CT results with her.  I reviewed the images.  The CTs are consistent with a continued  response to FOLFIRI.  There is a  single peritoneal nodule that has enlarged.  The plan is to continue FOLFIRI.  She will complete another cycle today. Ms. Venables will return for an office visit and chemotherapy in 2 weeks.  We will plan for a restaging CT after 5 more cycles of chemotherapy.  We will follow-up on the CA 19-9 from today.  [name], MD  03/04/2021  9:05 AM

## 2021-03-04 NOTE — Patient Instructions (Addendum)
[place]   The chemotherapy medication bag should finish at 46 hours, 96 hours, or 7 days. For example, if your pump is scheduled for 46 hours and it was put on at 4:00 p.m., it should finish at 2:00 p.m. the day it is scheduled to come off regardless of your appointment time.     Estimated time to finish at 11:00 Thursday, 03/06/21.   If the display on your pump reads "Low Volume" and it is beeping, take the batteries out of the pump and come to the cancer center for it to be taken off.   If the pump alarms go off prior to the pump reading "Low Volume" then call [phone] and someone can assist you.  If the plunger comes out and the chemotherapy medication is leaking out, please use your home chemo spill kit to clean up the spill. Do NOT use paper towels or other household products.  If you have problems or questions regarding your pump, please call either [phone] (24 hours a day) or the cancer center Monday-Friday 8:00 a.m.- 4:30 p.m. at the clinic number and we will assist you. If you are unable to get assistance, then go to the nearest Emergency Department and ask the staff to contact the IV team for assistance.  Discharge Instructions: Thank you for choosing [facility] to provide your oncology and hematology care.   If you have a lab appointment with the [facility], please go directly to the [facility] and check in at the registration area.   Wear comfortable clothing and clothing appropriate for easy access to any Portacath or PICC line.   We strive to give you quality time with your provider. You may need to reschedule your appointment if you arrive late (15 or more minutes).  Arriving late affects you and other patients whose appointments are after yours.  Also, if you miss three or more appointments without notifying the office, you may be dismissed from the clinic at the providers discretion.      For prescription refill  requests, have your pharmacy contact our office and allow 72 hours for refills to be completed.    Today you received the following chemotherapy and/or immunotherapy agents irinotecan, leucovorin, fluorouracil      To help prevent nausea and vomiting after your treatment, we encourage you to take your nausea medication as directed.  BELOW ARE SYMPTOMS THAT SHOULD BE REPORTED IMMEDIATELY: *FEVER GREATER THAN 100.4 F (38 C) OR HIGHER *CHILLS OR SWEATING *NAUSEA AND VOMITING THAT IS NOT CONTROLLED WITH YOUR NAUSEA MEDICATION *UNUSUAL SHORTNESS OF BREATH *UNUSUAL BRUISING OR BLEEDING *URINARY PROBLEMS (pain or burning when urinating, or frequent urination) *BOWEL PROBLEMS (unusual diarrhea, constipation, pain near the anus) TENDERNESS IN MOUTH AND THROAT WITH OR WITHOUT PRESENCE OF ULCERS (sore throat, sores in mouth, or a toothache) UNUSUAL RASH, SWELLING OR PAIN  UNUSUAL VAGINAL DISCHARGE OR ITCHING   Items with * indicate a potential emergency and should be followed up as soon as possible or go to the Emergency Department if any problems should occur.  Please show the CHEMOTHERAPY ALERT CARD or IMMUNOTHERAPY ALERT CARD at check-in to the Emergency Department and triage nurse.  Should you have questions after your visit or need to cancel or reschedule your appointment, please contact [facility]  Dept: [phone]  and follow the prompts.  Office hours are 8:00 a.m. to 4:30 p.m. Monday - Friday. Please note that voicemails left after  4:00 p.m. may not be returned until the following business day.  We are closed weekends and major holidays. You have access to a nurse at all times for urgent questions. Please call the main number to the clinic Dept: [phone] and follow the prompts.   For any non-urgent questions, you may also contact your provider using MyChart. We now offer e-Visits for anyone 40 and older to request care online for non-urgent symptoms. For  details visit [email or web address].   Also download the MyChart app! Go to the app store, search "MyChart", open the app, select [facility], and log in with your MyChart username and password.  Due to Covid, a mask is required upon entering the hospital/clinic. If you do not have a mask, one will be given to you upon arrival. For doctor visits, patients may have 1 support person aged 82 or older with them. For treatment visits, patients cannot have anyone with them due to current Covid guidelines and our immunocompromised population.   Irinotecan injection What is this medication? IRINOTECAN (ir in oh TEE kan ) is a chemotherapy drug. It is used to treat colon and rectal cancer. This medicine may be used for other purposes; ask your health care provider or pharmacist if you have questions. COMMON BRAND NAME(S): Camptosar What should I tell my care team before I take this medication? They need to know if you have any of these conditions: dehydration diarrhea infection (especially a virus infection such as chickenpox, cold sores, or herpes) liver disease low blood counts, like low white cell, platelet, or red cell counts low levels of calcium, magnesium, or potassium in the blood recent or ongoing radiation therapy an unusual or allergic reaction to irinotecan, other medicines, foods, dyes, or preservatives pregnant or trying to get pregnant breast-feeding How should I use this medication? This drug is given as an infusion into a vein. It is administered in a hospital or clinic by a specially trained health care professional. Talk to your pediatrician regarding the use of this medicine in children. Special care may be needed. Overdosage: If you think you have taken too much of this medicine contact a poison control center or emergency room at once. NOTE: This medicine is only for you. Do not share this medicine with others. What if I miss a dose? It is important not to miss your dose.  Call your doctor or health care professional if you are unable to keep an appointment. What may interact with this medication? Do not take this medicine with any of the following medications: cobicistat itraconazole This medicine may interact with the following medications: antiviral medicines for HIV or AIDS certain antibiotics like rifampin or rifabutin certain medicines for fungal infections like ketoconazole, posaconazole, and voriconazole certain medicines for seizures like carbamazepine, phenobarbital, phenotoin clarithromycin gemfibrozil nefazodone St. John's Wort This list may not describe all possible interactions. Give your health care provider a list of all the medicines, herbs, non-prescription drugs, or dietary supplements you use. Also tell them if you smoke, drink alcohol, or use illegal drugs. Some items may interact with your medicine. What should I watch for while using this medication? Your condition will be monitored carefully while you are receiving this medicine. You will need important blood work done while you are taking this medicine. This drug may make you feel generally unwell. This is not uncommon, as chemotherapy can affect healthy cells as well as cancer cells. Report any side effects. Continue your course of treatment even though you feel ill  unless your doctor tells you to stop. In some cases, you may be given additional medicines to help with side effects. Follow all directions for their use. You may get drowsy or dizzy. Do not drive, use machinery, or do anything that needs mental alertness until you know how this medicine affects you. Do not stand or sit up quickly, especially if you are an older patient. This reduces the risk of dizzy or fainting spells. Call your health care professional for advice if you get a fever, chills, or sore throat, or other symptoms of a cold or flu. Do not treat yourself. This medicine decreases your body's ability to fight  infections. Try to avoid being around people who are sick. Avoid taking products that contain aspirin, acetaminophen, ibuprofen, naproxen, or ketoprofen unless instructed by your doctor. These medicines may hide a fever. This medicine may increase your risk to bruise or bleed. Call your doctor or health care professional if you notice any unusual bleeding. Be careful brushing and flossing your teeth or using a toothpick because you may get an infection or bleed more easily. If you have any dental work done, tell your dentist you are receiving this medicine. Do not become pregnant while taking this medicine or for 6 months after stopping it. Women should inform their health care professional if they wish to become pregnant or think they might be pregnant. Men should not father a child while taking this medicine and for 3 months after stopping it. There is potential for serious side effects to an unborn child. Talk to your health care professional for more information. Do not breast-feed an infant while taking this medicine or for 7 days after stopping it. This medicine has caused ovarian failure in some women. This medicine may make it more difficult to get pregnant. Talk to your health care professional if you are concerned about your fertility. This medicine has caused decreased sperm counts in some men. This may make it more difficult to father a child. Talk to your health care professional if you are concerned about your fertility. What side effects may I notice from receiving this medication? Side effects that you should report to your doctor or health care professional as soon as possible: allergic reactions like skin rash, itching or hives, swelling of the face, lips, or tongue chest pain diarrhea flushing, runny nose, sweating during infusion low blood counts - this medicine may decrease the number of white blood cells, red blood cells and platelets. You may be at increased risk for infections  and bleeding. nausea, vomiting pain, swelling, warmth in the leg signs of decreased platelets or bleeding - bruising, pinpoint red spots on the skin, black, tarry stools, blood in the urine signs of infection - fever or chills, cough, sore throat, pain or difficulty passing urine signs of decreased red blood cells - unusually weak or tired, fainting spells, lightheadedness Side effects that usually do not require medical attention (report to your doctor or health care professional if they continue or are bothersome): constipation hair loss headache loss of appetite mouth sores stomach pain This list may not describe all possible side effects. Call your doctor for medical advice about side effects. You may report side effects to FDA at 1-800-FDA-1088. Where should I keep my medication? This drug is given in a hospital or clinic and will not be stored at home. NOTE: This sheet is a summary. It may not cover all possible information. If you have questions about this medicine, talk to  your doctor, pharmacist, or health care provider.  2022 Elsevier/Gold Standard (2020-11-05 00:00:00)  Leucovorin injection What is this medication? LEUCOVORIN (loo koe VOR in) is used to prevent or treat the harmful effects of some medicines. This medicine is used to treat anemia caused by a low amount of folic acid in the body. It is also used with 5-fluorouracil (5-FU) to treat colon cancer. This medicine may be used for other purposes; ask your health care provider or pharmacist if you have questions. What should I tell my care team before I take this medication? They need to know if you have any of these conditions: anemia from low levels of vitamin B-12 in the blood an unusual or allergic reaction to leucovorin, folic acid, other medicines, foods, dyes, or preservatives pregnant or trying to get pregnant breast-feeding How should I use this medication? This medicine is for injection into a muscle or into  a vein. It is given by a health care professional in a hospital or clinic setting. Talk to your pediatrician regarding the use of this medicine in children. Special care may be needed. Overdosage: If you think you have taken too much of this medicine contact a poison control center or emergency room at once. NOTE: This medicine is only for you. Do not share this medicine with others. What if I miss a dose? This does not apply. What may interact with this medication? capecitabine fluorouracil phenobarbital phenytoin primidone trimethoprim-sulfamethoxazole This list may not describe all possible interactions. Give your health care provider a list of all the medicines, herbs, non-prescription drugs, or dietary supplements you use. Also tell them if you smoke, drink alcohol, or use illegal drugs. Some items may interact with your medicine. What should I watch for while using this medication? Your condition will be monitored carefully while you are receiving this medicine. This medicine may increase the side effects of 5-fluorouracil, 5-FU. Tell your doctor or health care professional if you have diarrhea or mouth sores that do not get better or that get worse. What side effects may I notice from receiving this medication? Side effects that you should report to your doctor or health care professional as soon as possible: allergic reactions like skin rash, itching or hives, swelling of the face, lips, or tongue breathing problems fever, infection mouth sores unusual bleeding or bruising unusually weak or tired Side effects that usually do not require medical attention (report to your doctor or health care professional if they continue or are bothersome): constipation or diarrhea loss of appetite nausea, vomiting This list may not describe all possible side effects. Call your doctor for medical advice about side effects. You may report side effects to FDA at 1-800-FDA-1088. Where should I keep  my medication? This drug is given in a hospital or clinic and will not be stored at home. NOTE: This sheet is a summary. It may not cover all possible information. If you have questions about this medicine, talk to your doctor, pharmacist, or health care provider.  2022 Elsevier/Gold Standard (2007-08-25 00:00:00)  Fluorouracil, 5-FU injection What is this medication? FLUOROURACIL, 5-FU (flure oh YOOR a sil) is a chemotherapy drug. It slows the growth of cancer cells. This medicine is used to treat many types of cancer like breast cancer, colon or rectal cancer, pancreatic cancer, and stomach cancer. This medicine may be used for other purposes; ask your health care provider or pharmacist if you have questions. COMMON BRAND NAME(S): Adrucil What should I tell my care team before I  take this medication? They need to know if you have any of these conditions: blood disorders dihydropyrimidine dehydrogenase (DPD) deficiency infection (especially a virus infection such as chickenpox, cold sores, or herpes) kidney disease liver disease malnourished, poor nutrition recent or ongoing radiation therapy an unusual or allergic reaction to fluorouracil, other chemotherapy, other medicines, foods, dyes, or preservatives pregnant or trying to get pregnant breast-feeding How should I use this medication? This drug is given as an infusion or injection into a vein. It is administered in a hospital or clinic by a specially trained health care professional. Talk to your pediatrician regarding the use of this medicine in children. Special care may be needed. Overdosage: If you think you have taken too much of this medicine contact a poison control center or emergency room at once. NOTE: This medicine is only for you. Do not share this medicine with others. What if I miss a dose? It is important not to miss your dose. Call your doctor or health care professional if you are unable to keep an  appointment. What may interact with this medication? Do not take this medicine with any of the following medications: live virus vaccines This medicine may also interact with the following medications: medicines that treat or prevent blood clots like warfarin, enoxaparin, and dalteparin This list may not describe all possible interactions. Give your health care provider a list of all the medicines, herbs, non-prescription drugs, or dietary supplements you use. Also tell them if you smoke, drink alcohol, or use illegal drugs. Some items may interact with your medicine. What should I watch for while using this medication? Visit your doctor for checks on your progress. This drug may make you feel generally unwell. This is not uncommon, as chemotherapy can affect healthy cells as well as cancer cells. Report any side effects. Continue your course of treatment even though you feel ill unless your doctor tells you to stop. In some cases, you may be given additional medicines to help with side effects. Follow all directions for their use. Call your doctor or health care professional for advice if you get a fever, chills or sore throat, or other symptoms of a cold or flu. Do not treat yourself. This drug decreases your body's ability to fight infections. Try to avoid being around people who are sick. This medicine may increase your risk to bruise or bleed. Call your doctor or health care professional if you notice any unusual bleeding. Be careful brushing and flossing your teeth or using a toothpick because you may get an infection or bleed more easily. If you have any dental work done, tell your dentist you are receiving this medicine. Avoid taking products that contain aspirin, acetaminophen, ibuprofen, naproxen, or ketoprofen unless instructed by your doctor. These medicines may hide a fever. Do not become pregnant while taking this medicine. Women should inform their doctor if they wish to become pregnant  or think they might be pregnant. There is a potential for serious side effects to an unborn child. Talk to your health care professional or pharmacist for more information. Do not breast-feed an infant while taking this medicine. Men should inform their doctor if they wish to father a child. This medicine may lower sperm counts. Do not treat diarrhea with over the counter products. Contact your doctor if you have diarrhea that lasts more than 2 days or if it is severe and watery. This medicine can make you more sensitive to the sun. Keep out of the sun. If  you cannot avoid being in the sun, wear protective clothing and use sunscreen. Do not use sun lamps or tanning beds/booths. What side effects may I notice from receiving this medication? Side effects that you should report to your doctor or health care professional as soon as possible: allergic reactions like skin rash, itching or hives, swelling of the face, lips, or tongue low blood counts - this medicine may decrease the number of white blood cells, red blood cells and platelets. You may be at increased risk for infections and bleeding. signs of infection - fever or chills, cough, sore throat, pain or difficulty passing urine signs of decreased platelets or bleeding - bruising, pinpoint red spots on the skin, black, tarry stools, blood in the urine signs of decreased red blood cells - unusually weak or tired, fainting spells, lightheadedness breathing problems changes in vision chest pain mouth sores nausea and vomiting pain, swelling, redness at site where injected pain, tingling, numbness in the hands or feet redness, swelling, or sores on hands or feet stomach pain unusual bleeding Side effects that usually do not require medical attention (report to your doctor or health care professional if they continue or are bothersome): changes in finger or toe nails diarrhea dry or itchy skin hair loss headache loss of appetite sensitivity  of eyes to the light stomach upset unusually teary eyes This list may not describe all possible side effects. Call your doctor for medical advice about side effects. You may report side effects to FDA at 1-800-FDA-1088. Where should I keep my medication? This drug is given in a hospital or clinic and will not be stored at home. NOTE: This sheet is a summary. It may not cover all possible information. If you have questions about this medicine, talk to your doctor, pharmacist, or health care provider.  2022 Elsevier/Gold Standard (2020-11-05 00:00:00)

## 2021-03-04 NOTE — Telephone Encounter (Signed)
Patient seen by Dr. Sherrill today ? ?Vitals are within treatment parameters. ? ?Labs reviewed by Dr. Sherrill and are within treatment parameters. ? ?Per physician team, patient is ready for treatment and there are NO modifications to the treatment plan.  ?

## 2021-03-05 LAB — CANCER ANTIGEN 19-9: CA 19-9: 3615 U/mL — ABNORMAL HIGH (ref 0–35)

## 2021-03-06 ENCOUNTER — Inpatient Hospital Stay: Payer: Medicaid Other

## 2021-03-06 ENCOUNTER — Ambulatory Visit: Payer: Medicaid Other | Attending: Internal Medicine

## 2021-03-06 ENCOUNTER — Other Ambulatory Visit (HOSPITAL_BASED_OUTPATIENT_CLINIC_OR_DEPARTMENT_OTHER): Payer: Self-pay

## 2021-03-06 ENCOUNTER — Other Ambulatory Visit: Payer: Self-pay

## 2021-03-06 VITALS — BP 154/78 | HR 87 | Temp 97.9°F | Resp 18

## 2021-03-06 DIAGNOSIS — C252 Malignant neoplasm of tail of pancreas: Secondary | ICD-10-CM

## 2021-03-06 DIAGNOSIS — Z5111 Encounter for antineoplastic chemotherapy: Secondary | ICD-10-CM | POA: Diagnosis not present

## 2021-03-06 DIAGNOSIS — Z23 Encounter for immunization: Secondary | ICD-10-CM

## 2021-03-06 MED ORDER — HEPARIN SOD (PORK) LOCK FLUSH 100 UNIT/ML IV SOLN
500.0000 [IU] | Freq: Once | INTRAVENOUS | Status: AC | PRN
  Administered 2021-03-06: 500 [IU]

## 2021-03-06 MED ORDER — SODIUM CHLORIDE 0.9% FLUSH
10.0000 mL | INTRAVENOUS | Status: DC | PRN
  Administered 2021-03-06: 10 mL

## 2021-03-06 MED ORDER — PEGFILGRASTIM-BMEZ 6 MG/0.6ML ~~LOC~~ SOSY
6.0000 mg | PREFILLED_SYRINGE | Freq: Once | SUBCUTANEOUS | Status: AC
  Administered 2021-03-06: 6 mg via SUBCUTANEOUS

## 2021-03-06 MED ORDER — PFIZER-BIONT COVID-19 VAC-TRIS 30 MCG/0.3ML IM SUSP
INTRAMUSCULAR | 0 refills | Status: DC
  Filled 2021-03-06: qty 0.3, 1d supply, fill #0

## 2021-03-06 NOTE — Progress Notes (Signed)
° °  Covid-19 Vaccination Clinic  Name:  Kirsten [name]    MRN: [id number] DOB: 1960/06/29  03/06/2021  [name] was observed post Covid-19 immunization for 15 minutes without incident. She was provided with Vaccine Information Sheet and instruction to access the V-Safe system.   [name] was instructed to call 911 with any severe reactions post vaccine: Difficulty breathing  Swelling of face and throat  A fast heartbeat  A bad rash all over body  Dizziness and weakness   Immunizations Administered     Name Date Dose VIS Date Route   PFIZER Comrnaty(Gray TOP) Covid-19 Vaccine 03/06/2021 11:29 AM 0.3 mL 10/30/2020 Intramuscular   Manufacturer: [employer]   Lot: [id number]   [place]: [phone]

## 2021-03-06 NOTE — Patient Instructions (Signed)

## 2021-03-16 ENCOUNTER — Other Ambulatory Visit: Payer: Self-pay | Admitting: Oncology

## 2021-03-17 ENCOUNTER — Inpatient Hospital Stay: Payer: Medicaid Other

## 2021-03-17 ENCOUNTER — Other Ambulatory Visit: Payer: Self-pay

## 2021-03-17 ENCOUNTER — Encounter: Payer: Self-pay | Admitting: Nurse Practitioner

## 2021-03-17 ENCOUNTER — Encounter: Payer: Self-pay | Admitting: *Deleted

## 2021-03-17 ENCOUNTER — Inpatient Hospital Stay (HOSPITAL_BASED_OUTPATIENT_CLINIC_OR_DEPARTMENT_OTHER): Payer: Medicaid Other | Admitting: Nurse Practitioner

## 2021-03-17 VITALS — BP 158/90 | HR 97 | Temp 97.8°F | Resp 20 | Ht 63.0 in | Wt 135.2 lb

## 2021-03-17 VITALS — BP 143/88 | HR 91 | Temp 97.9°F | Resp 18

## 2021-03-17 DIAGNOSIS — C252 Malignant neoplasm of tail of pancreas: Secondary | ICD-10-CM

## 2021-03-17 DIAGNOSIS — Z5111 Encounter for antineoplastic chemotherapy: Secondary | ICD-10-CM | POA: Diagnosis not present

## 2021-03-17 LAB — CBC WITH DIFFERENTIAL (CANCER CENTER ONLY)
Abs Immature Granulocytes: 0.15 10*3/uL — ABNORMAL HIGH (ref 0.00–0.07)
Basophils Absolute: 0.1 10*3/uL (ref 0.0–0.1)
Basophils Relative: 1 %
Eosinophils Absolute: 0.3 10*3/uL (ref 0.0–0.5)
Eosinophils Relative: 2 %
HCT: 36.6 % (ref 36.0–46.0)
Hemoglobin: 11.4 g/dL — ABNORMAL LOW (ref 12.0–15.0)
Immature Granulocytes: 1 %
Lymphocytes Relative: 22 %
Lymphs Abs: 2.9 10*3/uL (ref 0.7–4.0)
MCH: 30 pg (ref 26.0–34.0)
MCHC: 31.1 g/dL (ref 30.0–36.0)
MCV: 96.3 fL (ref 80.0–100.0)
Monocytes Absolute: 1.1 10*3/uL — ABNORMAL HIGH (ref 0.1–1.0)
Monocytes Relative: 8 %
Neutro Abs: 8.7 10*3/uL — ABNORMAL HIGH (ref 1.7–7.7)
Neutrophils Relative %: 66 %
Platelet Count: 269 10*3/uL (ref 150–400)
RBC: 3.8 MIL/uL — ABNORMAL LOW (ref 3.87–5.11)
RDW: 15.1 % (ref 11.5–15.5)
WBC Count: 13.2 10*3/uL — ABNORMAL HIGH (ref 4.0–10.5)
nRBC: 0 % (ref 0.0–0.2)

## 2021-03-17 LAB — CMP (CANCER CENTER ONLY)
ALT: 40 U/L (ref 0–44)
AST: 30 U/L (ref 15–41)
Albumin: 3.8 g/dL (ref 3.5–5.0)
Alkaline Phosphatase: 308 U/L — ABNORMAL HIGH (ref 38–126)
Anion gap: 7 (ref 5–15)
BUN: 6 mg/dL — ABNORMAL LOW (ref 8–23)
CO2: 28 mmol/L (ref 22–32)
Calcium: 10 mg/dL (ref 8.9–10.3)
Chloride: 105 mmol/L (ref 98–111)
Creatinine: 0.83 mg/dL (ref 0.44–1.00)
GFR, Estimated: >60 mL/min (ref >60)
Glucose, Bld: 111 mg/dL — ABNORMAL HIGH (ref 70–99)
Potassium: 3.4 mmol/L — ABNORMAL LOW (ref 3.5–5.1)
Sodium: 140 mmol/L (ref 135–145)
Total Bilirubin: 0.3 mg/dL (ref 0.3–1.2)
Total Protein: 7 g/dL (ref 6.5–8.1)

## 2021-03-17 LAB — MAGNESIUM: Magnesium: 1.9 mg/dL (ref 1.7–2.4)

## 2021-03-17 MED ORDER — SODIUM CHLORIDE 0.9 % IV SOLN
150.0000 mg | Freq: Once | INTRAVENOUS | Status: AC
  Administered 2021-03-17: 150 mg via INTRAVENOUS
  Filled 2021-03-17: qty 150

## 2021-03-17 MED ORDER — PALONOSETRON HCL INJECTION 0.25 MG/5ML
0.2500 mg | Freq: Once | INTRAVENOUS | Status: AC
  Administered 2021-03-17: 0.25 mg via INTRAVENOUS
  Filled 2021-03-17: qty 5

## 2021-03-17 MED ORDER — SODIUM CHLORIDE 0.9 % IV SOLN
400.0000 mg/m2 | Freq: Once | INTRAVENOUS | Status: AC
  Administered 2021-03-17: 644 mg via INTRAVENOUS
  Filled 2021-03-17: qty 17.5

## 2021-03-17 MED ORDER — SODIUM CHLORIDE 0.9 % IV SOLN: INTRAVENOUS | Status: DC

## 2021-03-17 MED ORDER — ATROPINE SULFATE 1 MG/ML IV SOLN
0.5000 mg | Freq: Once | INTRAVENOUS | Status: AC | PRN
  Administered 2021-03-17: 0.5 mg via INTRAVENOUS
  Filled 2021-03-17: qty 1

## 2021-03-17 MED ORDER — SODIUM CHLORIDE 0.9 % IV SOLN
400.0000 mg/m2 | Freq: Once | INTRAVENOUS | Status: DC
  Filled 2021-03-17: qty 32.2

## 2021-03-17 MED ORDER — SODIUM CHLORIDE 0.9 % IV SOLN
2400.0000 mg/m2 | INTRAVENOUS | Status: DC
  Administered 2021-03-17: 3850 mg via INTRAVENOUS
  Filled 2021-03-17: qty 77

## 2021-03-17 MED ORDER — SODIUM CHLORIDE 0.9 % IV SOLN
125.0000 mg/m2 | Freq: Once | INTRAVENOUS | Status: AC
  Administered 2021-03-17: 200 mg via INTRAVENOUS
  Filled 2021-03-17: qty 10

## 2021-03-17 MED ORDER — SODIUM CHLORIDE 0.9 % IV SOLN
10.0000 mg | Freq: Once | INTRAVENOUS | Status: AC
  Administered 2021-03-17: 10 mg via INTRAVENOUS
  Filled 2021-03-17: qty 10

## 2021-03-17 NOTE — Progress Notes (Signed)
Patient presents for treatment. RN assessment completed along with the following:  Labs/vitals reviewed - Yes, and within treatment parameters.   Weight within 10% of previous measurement - Yes Oncology Treatment Attestation completed for current therapy- Yes, on date 06/27/20 Informed consent completed and reflects current therapy/intent - Yes, on date 07/01/20             Provider progress note reviewed - today's note available Treatment/Antibody/Supportive plan reviewed - Yes, and there are no adjustments needed for today's treatment. S&H and other orders reviewed - Yes, and there are no additional orders identified. Previous treatment date reviewed - Yes, and the appropriate amount of time has elapsed between treatments. Clinic Hand Off Received from - Cristy Friedlander, RN  Patient to proceed with treatment. Marland Kitchen

## 2021-03-17 NOTE — Progress Notes (Signed)
°  [facility] OFFICE PROGRESS NOTE   Diagnosis: Pancreas cancer  INTERVAL HISTORY:   Kirsten [name] returns as scheduled.  She completed another cycle of FOLFIRI 03/04/2021.  She denies nausea/vomiting.  No mouth sores.  No diarrhea.  No abdominal pain.  She has a good appetite.  Objective:  Vital signs in last 24 hours:  Blood pressure (!) 158/90, pulse 97, temperature 97.8 F (36.6 C), temperature source Oral, resp. rate 20, height 5\' 3"  (1.6 m), weight 135 lb 3.2 oz (61.3 kg), SpO2 100 %.    HEENT: No thrush or ulcers. Resp: Lungs clear bilaterally. Cardio: Regular rate and rhythm. GI: Abdomen soft and nontender.  No hepatosplenomegaly. Vascular: No leg edema. Skin: Palms without erythema. Port-A-Cath without erythema.   Lab Results:  Lab Results  Component Value Date   WBC 13.2 (H) 03/17/2021   HGB 11.4 (L) 03/17/2021   HCT 36.6 03/17/2021   MCV 96.3 03/17/2021   PLT 269 03/17/2021   NEUTROABS 8.7 (H) 03/17/2021    Imaging:  No results found.  Medications: I have reviewed the patient's current medications.  Assessment/Plan: Metastatic pancreas cancer Right upper quadrant ultrasound 06/17/2020-multiple hypoechoic liver lesions CT abdomen/pelvis 06/17/2020- pancreas tail mass, multiple liver metastases, enlarged periaortic nodes, right ovary metastasis?,  Nodularity at the left paracolic gutter and posterior/inferior stomach suggesting peritoneal tumor spread CT chest 06/17/2020- scattered small pulmonary nodules consistent with metastatic disease Ultrasound-guided biopsy of a left liver mass 06/18/2020- adenocarcinoma, morphology compatible with metastatic pancreatic adenocarcinoma Cycle 1 FOLFOX 07/01/2020 Cycle 2 FOLFOX 07/16/2020 Cycle 3 FOLFOX 07/31/2020 Cycle 4 FOLFIRINOX 08/13/2020 Cycle 5 FOLFIRINOX 09/03/2020, Udenyca CTs 09/13/2020-unchanged nodule medial left lower lobe; stable nodal and likely peritoneal disease in the abdomen/pelvis; unchanged  pancreatic tail mass; multifocal hepatic metastases, mildly progressive Cycle 6 FOLFIRINOX 09/16/2020, Oxaliplatin held Cycle 7 FOLFIRINOX 09/30/2020 Cycle 8 FOLFIRINOX 10/14/2020, oxaliplatin held Cycle 9 FOLFIRINOX 10/28/2020, oxaliplatin held Cycle 10 FOLFIRINOX 11/11/2020, oxaliplatin held Cycle 11 FOLFIRINOX 11/25/2020, oxaliplatin held CTs 12/06/2020-primary pancreatic tail tumor decreased in size.  Widespread liver metastases all decreased.  Previously visualized peritoneal nodule adjacent to the distal stomach resolved.  Left lower lobe pulmonary nodule stable.  No new or progressive metastatic disease. Cycle 12 FOLFIRI 12/09/2020 Cycle 13 FOLFIRI 12/23/2020 Cycle 14 FOLFIRI 01/06/2021 Cycle 15 FOLFIRI 01/20/2021 Cycle 16 FOLFIRI 02/03/2021 Cycle 17 FOLFIRI 02/17/2021 CTs 02/27/2021-slight decrease in size of pancreas tail mass and hypodense liver lesions, unchanged left lower lobe nodule, enlargement of a single peritoneal nodule anterior to the distal descending colon Cycle 18 FOLFIRI 03/04/2021 Cycle 19 FOLFIRI 03/17/2021 2.  Pain secondary to #1-improved 3.  Weight loss-improved 4.  Family history of pancreas cancer 5.  Neutropenia secondary chemotherapy-G-CSF added with cycle 5 FOLFIRINOX 6.  Oxaliplatin neuropathy-oxaliplatin held 09/16/2020, 10/14/2020, 10/28/2020, 11/11/2020, 11/25/2020    Disposition: [name] appears stable.  She has completed 18 cycles of FOLFIRI.  There is no clinical evidence of disease progression.  Plan to proceed with cycle 19 today as scheduled.  We reviewed the CBC from today.  Counts adequate to proceed with treatment.  She will return for lab, follow-up, FOLFIRI in 2 weeks.  We are available to see her sooner if needed.    Ned Card ANP/GNP-BC   03/17/2021  8:25 AM

## 2021-03-17 NOTE — Patient Instructions (Signed)
[facility] The chemotherapy medication bag should finish at 46 hours, 96 hours, or 7 days. For example, if your pump is scheduled for 46 hours and it was put on at 4:00 p.m., it should finish at 2:00 p.m. the day it is scheduled to come off regardless of your appointment time.     Estimated time to finish at 12:00 Wednesday, 03/19/21.   If the display on your pump reads "Low Volume" and it is beeping, take the batteries out of the pump and come to the cancer center for it to be taken off.   If the pump alarms go off prior to the pump reading "Low Volume" then call [phone] and someone can assist you.  If the plunger comes out and the chemotherapy medication is leaking out, please use your home chemo spill kit to clean up the spill. Do NOT use paper towels or other household products.  If you have problems or questions regarding your pump, please call either [phone] (24 hours a day) or the cancer center Monday-Friday 8:00 a.m.- 4:30 p.m. at the clinic number and we will assist you. If you are unable to get assistance, then go to the nearest Emergency Department and ask the staff to contact the IV team for assistance.   Discharge Instructions: Thank you for choosing [facility] to provide your oncology and hematology care.   If you have a lab appointment with the [facility], please go directly to the [facility] and check in at the registration area.   Wear comfortable clothing and clothing appropriate for easy access to any Portacath or PICC line.   We strive to give you quality time with your provider. You may need to reschedule your appointment if you arrive late (15 or more minutes).  Arriving late affects you and other patients whose appointments are after yours.  Also, if you miss three or more appointments without notifying the office, you may be dismissed from the clinic at the providers discretion.      For prescription refill  requests, have your pharmacy contact our office and allow 72 hours for refills to be completed.    Today you received the following chemotherapy and/or immunotherapy agents irinotecan, leucovorin, fluorouracil      To help prevent nausea and vomiting after your treatment, we encourage you to take your nausea medication as directed.  BELOW ARE SYMPTOMS THAT SHOULD BE REPORTED IMMEDIATELY: *FEVER GREATER THAN 100.4 F (38 C) OR HIGHER *CHILLS OR SWEATING *NAUSEA AND VOMITING THAT IS NOT CONTROLLED WITH YOUR NAUSEA MEDICATION *UNUSUAL SHORTNESS OF BREATH *UNUSUAL BRUISING OR BLEEDING *URINARY PROBLEMS (pain or burning when urinating, or frequent urination) *BOWEL PROBLEMS (unusual diarrhea, constipation, pain near the anus) TENDERNESS IN MOUTH AND THROAT WITH OR WITHOUT PRESENCE OF ULCERS (sore throat, sores in mouth, or a toothache) UNUSUAL RASH, SWELLING OR PAIN  UNUSUAL VAGINAL DISCHARGE OR ITCHING   Items with * indicate a potential emergency and should be followed up as soon as possible or go to the Emergency Department if any problems should occur.  Please show the CHEMOTHERAPY ALERT CARD or IMMUNOTHERAPY ALERT CARD at check-in to the Emergency Department and triage nurse.  Should you have questions after your visit or need to cancel or reschedule your appointment, please contact [facility]  Dept: [phone]  and follow the prompts.  Office hours are 8:00 a.m. to 4:30 p.m. Monday - Friday. Please note that voicemails left after 4:00  p.m. may not be returned until the following business day.  We are closed weekends and major holidays. You have access to a nurse at all times for urgent questions. Please call the main number to the clinic Dept: [phone] and follow the prompts.   For any non-urgent questions, you may also contact your provider using MyChart. We now offer e-Visits for anyone 62 and older to request care online for non-urgent symptoms. For  details visit [email or web address].   Also download the MyChart app! Go to the app store, search "MyChart", open the app, select [facility], and log in with your MyChart username and password.  Due to Covid, a mask is required upon entering the hospital/clinic. If you do not have a mask, one will be given to you upon arrival. For doctor visits, patients may have 1 support person aged 54 or older with them. For treatment visits, patients cannot have anyone with them due to current Covid guidelines and our immunocompromised population.   Irinotecan injection What is this medication? IRINOTECAN (ir in oh TEE kan ) is a chemotherapy drug. It is used to treat colon and rectal cancer. This medicine may be used for other purposes; ask your health care provider or pharmacist if you have questions. COMMON BRAND NAME(S): Camptosar What should I tell my care team before I take this medication? They need to know if you have any of these conditions: dehydration diarrhea infection (especially a virus infection such as chickenpox, cold sores, or herpes) liver disease low blood counts, like low white cell, platelet, or red cell counts low levels of calcium, magnesium, or potassium in the blood recent or ongoing radiation therapy an unusual or allergic reaction to irinotecan, other medicines, foods, dyes, or preservatives pregnant or trying to get pregnant breast-feeding How should I use this medication? This drug is given as an infusion into a vein. It is administered in a hospital or clinic by a specially trained health care professional. Talk to your pediatrician regarding the use of this medicine in children. Special care may be needed. Overdosage: If you think you have taken too much of this medicine contact a poison control center or emergency room at once. NOTE: This medicine is only for you. Do not share this medicine with others. What if I miss a dose? It is important not to miss your dose.  Call your doctor or health care professional if you are unable to keep an appointment. What may interact with this medication? Do not take this medicine with any of the following medications: cobicistat itraconazole This medicine may interact with the following medications: antiviral medicines for HIV or AIDS certain antibiotics like rifampin or rifabutin certain medicines for fungal infections like ketoconazole, posaconazole, and voriconazole certain medicines for seizures like carbamazepine, phenobarbital, phenotoin clarithromycin gemfibrozil nefazodone St. John's Wort This list may not describe all possible interactions. Give your health care provider a list of all the medicines, herbs, non-prescription drugs, or dietary supplements you use. Also tell them if you smoke, drink alcohol, or use illegal drugs. Some items may interact with your medicine. What should I watch for while using this medication? Your condition will be monitored carefully while you are receiving this medicine. You will need important blood work done while you are taking this medicine. This drug may make you feel generally unwell. This is not uncommon, as chemotherapy can affect healthy cells as well as cancer cells. Report any side effects. Continue your course of treatment even though you feel ill unless  your doctor tells you to stop. In some cases, you may be given additional medicines to help with side effects. Follow all directions for their use. You may get drowsy or dizzy. Do not drive, use machinery, or do anything that needs mental alertness until you know how this medicine affects you. Do not stand or sit up quickly, especially if you are an older patient. This reduces the risk of dizzy or fainting spells. Call your health care professional for advice if you get a fever, chills, or sore throat, or other symptoms of a cold or flu. Do not treat yourself. This medicine decreases your body's ability to fight  infections. Try to avoid being around people who are sick. Avoid taking products that contain aspirin, acetaminophen, ibuprofen, naproxen, or ketoprofen unless instructed by your doctor. These medicines may hide a fever. This medicine may increase your risk to bruise or bleed. Call your doctor or health care professional if you notice any unusual bleeding. Be careful brushing and flossing your teeth or using a toothpick because you may get an infection or bleed more easily. If you have any dental work done, tell your dentist you are receiving this medicine. Do not become pregnant while taking this medicine or for 6 months after stopping it. Women should inform their health care professional if they wish to become pregnant or think they might be pregnant. Men should not father a child while taking this medicine and for 3 months after stopping it. There is potential for serious side effects to an unborn child. Talk to your health care professional for more information. Do not breast-feed an infant while taking this medicine or for 7 days after stopping it. This medicine has caused ovarian failure in some women. This medicine may make it more difficult to get pregnant. Talk to your health care professional if you are concerned about your fertility. This medicine has caused decreased sperm counts in some men. This may make it more difficult to father a child. Talk to your health care professional if you are concerned about your fertility. What side effects may I notice from receiving this medication? Side effects that you should report to your doctor or health care professional as soon as possible: allergic reactions like skin rash, itching or hives, swelling of the face, lips, or tongue chest pain diarrhea flushing, runny nose, sweating during infusion low blood counts - this medicine may decrease the number of white blood cells, red blood cells and platelets. You may be at increased risk for infections  and bleeding. nausea, vomiting pain, swelling, warmth in the leg signs of decreased platelets or bleeding - bruising, pinpoint red spots on the skin, black, tarry stools, blood in the urine signs of infection - fever or chills, cough, sore throat, pain or difficulty passing urine signs of decreased red blood cells - unusually weak or tired, fainting spells, lightheadedness Side effects that usually do not require medical attention (report to your doctor or health care professional if they continue or are bothersome): constipation hair loss headache loss of appetite mouth sores stomach pain This list may not describe all possible side effects. Call your doctor for medical advice about side effects. You may report side effects to FDA at 1-800-FDA-1088. Where should I keep my medication? This drug is given in a hospital or clinic and will not be stored at home. NOTE: This sheet is a summary. It may not cover all possible information. If you have questions about this medicine, talk to your  doctor, pharmacist, or health care provider.  2022 Elsevier/Gold Standard (2020-11-05 00:00:00)  Leucovorin injection What is this medication? LEUCOVORIN (loo koe VOR in) is used to prevent or treat the harmful effects of some medicines. This medicine is used to treat anemia caused by a low amount of folic acid in the body. It is also used with 5-fluorouracil (5-FU) to treat colon cancer. This medicine may be used for other purposes; ask your health care provider or pharmacist if you have questions. What should I tell my care team before I take this medication? They need to know if you have any of these conditions: anemia from low levels of vitamin B-12 in the blood an unusual or allergic reaction to leucovorin, folic acid, other medicines, foods, dyes, or preservatives pregnant or trying to get pregnant breast-feeding How should I use this medication? This medicine is for injection into a muscle or into  a vein. It is given by a health care professional in a hospital or clinic setting. Talk to your pediatrician regarding the use of this medicine in children. Special care may be needed. Overdosage: If you think you have taken too much of this medicine contact a poison control center or emergency room at once. NOTE: This medicine is only for you. Do not share this medicine with others. What if I miss a dose? This does not apply. What may interact with this medication? capecitabine fluorouracil phenobarbital phenytoin primidone trimethoprim-sulfamethoxazole This list may not describe all possible interactions. Give your health care provider a list of all the medicines, herbs, non-prescription drugs, or dietary supplements you use. Also tell them if you smoke, drink alcohol, or use illegal drugs. Some items may interact with your medicine. What should I watch for while using this medication? Your condition will be monitored carefully while you are receiving this medicine. This medicine may increase the side effects of 5-fluorouracil, 5-FU. Tell your doctor or health care professional if you have diarrhea or mouth sores that do not get better or that get worse. What side effects may I notice from receiving this medication? Side effects that you should report to your doctor or health care professional as soon as possible: allergic reactions like skin rash, itching or hives, swelling of the face, lips, or tongue breathing problems fever, infection mouth sores unusual bleeding or bruising unusually weak or tired Side effects that usually do not require medical attention (report to your doctor or health care professional if they continue or are bothersome): constipation or diarrhea loss of appetite nausea, vomiting This list may not describe all possible side effects. Call your doctor for medical advice about side effects. You may report side effects to FDA at 1-800-FDA-1088. Where should I keep  my medication? This drug is given in a hospital or clinic and will not be stored at home. NOTE: This sheet is a summary. It may not cover all possible information. If you have questions about this medicine, talk to your doctor, pharmacist, or health care provider.  2022 Elsevier/Gold Standard (2007-08-25 00:00:00)  Fluorouracil, 5-FU injection What is this medication? FLUOROURACIL, 5-FU (flure oh YOOR a sil) is a chemotherapy drug. It slows the growth of cancer cells. This medicine is used to treat many types of cancer like breast cancer, colon or rectal cancer, pancreatic cancer, and stomach cancer. This medicine may be used for other purposes; ask your health care provider or pharmacist if you have questions. COMMON BRAND NAME(S): Adrucil What should I tell my care team before I take  this medication? They need to know if you have any of these conditions: blood disorders dihydropyrimidine dehydrogenase (DPD) deficiency infection (especially a virus infection such as chickenpox, cold sores, or herpes) kidney disease liver disease malnourished, poor nutrition recent or ongoing radiation therapy an unusual or allergic reaction to fluorouracil, other chemotherapy, other medicines, foods, dyes, or preservatives pregnant or trying to get pregnant breast-feeding How should I use this medication? This drug is given as an infusion or injection into a vein. It is administered in a hospital or clinic by a specially trained health care professional. Talk to your pediatrician regarding the use of this medicine in children. Special care may be needed. Overdosage: If you think you have taken too much of this medicine contact a poison control center or emergency room at once. NOTE: This medicine is only for you. Do not share this medicine with others. What if I miss a dose? It is important not to miss your dose. Call your doctor or health care professional if you are unable to keep an  appointment. What may interact with this medication? Do not take this medicine with any of the following medications: live virus vaccines This medicine may also interact with the following medications: medicines that treat or prevent blood clots like warfarin, enoxaparin, and dalteparin This list may not describe all possible interactions. Give your health care provider a list of all the medicines, herbs, non-prescription drugs, or dietary supplements you use. Also tell them if you smoke, drink alcohol, or use illegal drugs. Some items may interact with your medicine. What should I watch for while using this medication? Visit your doctor for checks on your progress. This drug may make you feel generally unwell. This is not uncommon, as chemotherapy can affect healthy cells as well as cancer cells. Report any side effects. Continue your course of treatment even though you feel ill unless your doctor tells you to stop. In some cases, you may be given additional medicines to help with side effects. Follow all directions for their use. Call your doctor or health care professional for advice if you get a fever, chills or sore throat, or other symptoms of a cold or flu. Do not treat yourself. This drug decreases your body's ability to fight infections. Try to avoid being around people who are sick. This medicine may increase your risk to bruise or bleed. Call your doctor or health care professional if you notice any unusual bleeding. Be careful brushing and flossing your teeth or using a toothpick because you may get an infection or bleed more easily. If you have any dental work done, tell your dentist you are receiving this medicine. Avoid taking products that contain aspirin, acetaminophen, ibuprofen, naproxen, or ketoprofen unless instructed by your doctor. These medicines may hide a fever. Do not become pregnant while taking this medicine. Women should inform their doctor if they wish to become pregnant  or think they might be pregnant. There is a potential for serious side effects to an unborn child. Talk to your health care professional or pharmacist for more information. Do not breast-feed an infant while taking this medicine. Men should inform their doctor if they wish to father a child. This medicine may lower sperm counts. Do not treat diarrhea with over the counter products. Contact your doctor if you have diarrhea that lasts more than 2 days or if it is severe and watery. This medicine can make you more sensitive to the sun. Keep out of the sun. If you  cannot avoid being in the sun, wear protective clothing and use sunscreen. Do not use sun lamps or tanning beds/booths. What side effects may I notice from receiving this medication? Side effects that you should report to your doctor or health care professional as soon as possible: allergic reactions like skin rash, itching or hives, swelling of the face, lips, or tongue low blood counts - this medicine may decrease the number of white blood cells, red blood cells and platelets. You may be at increased risk for infections and bleeding. signs of infection - fever or chills, cough, sore throat, pain or difficulty passing urine signs of decreased platelets or bleeding - bruising, pinpoint red spots on the skin, black, tarry stools, blood in the urine signs of decreased red blood cells - unusually weak or tired, fainting spells, lightheadedness breathing problems changes in vision chest pain mouth sores nausea and vomiting pain, swelling, redness at site where injected pain, tingling, numbness in the hands or feet redness, swelling, or sores on hands or feet stomach pain unusual bleeding Side effects that usually do not require medical attention (report to your doctor or health care professional if they continue or are bothersome): changes in finger or toe nails diarrhea dry or itchy skin hair loss headache loss of appetite sensitivity  of eyes to the light stomach upset unusually teary eyes This list may not describe all possible side effects. Call your doctor for medical advice about side effects. You may report side effects to FDA at 1-800-FDA-1088. Where should I keep my medication? This drug is given in a hospital or clinic and will not be stored at home. NOTE: This sheet is a summary. It may not cover all possible information. If you have questions about this medicine, talk to your doctor, pharmacist, or health care provider.  2022 Elsevier/Gold Standard (2020-11-05 00:00:00)

## 2021-03-17 NOTE — Progress Notes (Signed)
Patient seen by Lisa Thomas NP today  Vitals are within treatment parameters.  Labs reviewed by Lisa Thomas NP and are within treatment parameters.  Per physician team, patient is ready for treatment and there are NO modifications to the treatment plan.     

## 2021-03-18 LAB — CANCER ANTIGEN 19-9: CA 19-9: 5136 U/mL — ABNORMAL HIGH (ref 0–35)

## 2021-03-19 ENCOUNTER — Other Ambulatory Visit: Payer: Self-pay

## 2021-03-19 ENCOUNTER — Inpatient Hospital Stay: Payer: Medicaid Other

## 2021-03-19 VITALS — BP 161/89 | HR 90 | Temp 98.8°F | Resp 20

## 2021-03-19 DIAGNOSIS — Z5111 Encounter for antineoplastic chemotherapy: Secondary | ICD-10-CM | POA: Diagnosis not present

## 2021-03-19 DIAGNOSIS — C252 Malignant neoplasm of tail of pancreas: Secondary | ICD-10-CM

## 2021-03-19 MED ORDER — PEGFILGRASTIM-BMEZ 6 MG/0.6ML ~~LOC~~ SOSY
6.0000 mg | PREFILLED_SYRINGE | Freq: Once | SUBCUTANEOUS | Status: AC
  Administered 2021-03-19: 6 mg via SUBCUTANEOUS
  Filled 2021-03-19: qty 0.6

## 2021-03-19 MED ORDER — SODIUM CHLORIDE 0.9% FLUSH
10.0000 mL | INTRAVENOUS | Status: DC | PRN
  Administered 2021-03-19: 10 mL

## 2021-03-19 MED ORDER — HEPARIN SOD (PORK) LOCK FLUSH 100 UNIT/ML IV SOLN
500.0000 [IU] | Freq: Once | INTRAVENOUS | Status: AC | PRN
  Administered 2021-03-19: 500 [IU]

## 2021-03-19 NOTE — Patient Instructions (Signed)

## 2021-03-30 ENCOUNTER — Other Ambulatory Visit: Payer: Self-pay | Admitting: Oncology

## 2021-03-31 ENCOUNTER — Inpatient Hospital Stay: Payer: Medicaid Other

## 2021-03-31 ENCOUNTER — Inpatient Hospital Stay (HOSPITAL_BASED_OUTPATIENT_CLINIC_OR_DEPARTMENT_OTHER): Payer: Medicaid Other | Admitting: Oncology

## 2021-03-31 ENCOUNTER — Other Ambulatory Visit: Payer: Self-pay

## 2021-03-31 VITALS — BP 160/87 | HR 96 | Temp 98.0°F | Resp 18 | Ht 63.0 in | Wt 135.8 lb

## 2021-03-31 DIAGNOSIS — C252 Malignant neoplasm of tail of pancreas: Secondary | ICD-10-CM

## 2021-03-31 DIAGNOSIS — Z5111 Encounter for antineoplastic chemotherapy: Secondary | ICD-10-CM | POA: Diagnosis not present

## 2021-03-31 LAB — CMP (CANCER CENTER ONLY)
ALT: 26 U/L (ref 0–44)
AST: 28 U/L (ref 15–41)
Albumin: 3.7 g/dL (ref 3.5–5.0)
Alkaline Phosphatase: 296 U/L — ABNORMAL HIGH (ref 38–126)
Anion gap: 7 (ref 5–15)
BUN: 6 mg/dL — ABNORMAL LOW (ref 8–23)
CO2: 28 mmol/L (ref 22–32)
Calcium: 9.4 mg/dL (ref 8.9–10.3)
Chloride: 108 mmol/L (ref 98–111)
Creatinine: 0.81 mg/dL (ref 0.44–1.00)
GFR, Estimated: >60 mL/min (ref >60)
Glucose, Bld: 113 mg/dL — ABNORMAL HIGH (ref 70–99)
Potassium: 3.7 mmol/L (ref 3.5–5.1)
Sodium: 143 mmol/L (ref 135–145)
Total Bilirubin: 0.3 mg/dL (ref 0.3–1.2)
Total Protein: 6.7 g/dL (ref 6.5–8.1)

## 2021-03-31 LAB — CBC WITH DIFFERENTIAL (CANCER CENTER ONLY)
Abs Immature Granulocytes: 0.08 10*3/uL — ABNORMAL HIGH (ref 0.00–0.07)
Basophils Absolute: 0.1 10*3/uL (ref 0.0–0.1)
Basophils Relative: 1 %
Eosinophils Absolute: 0.3 10*3/uL (ref 0.0–0.5)
Eosinophils Relative: 3 %
HCT: 36.3 % (ref 36.0–46.0)
Hemoglobin: 11.5 g/dL — ABNORMAL LOW (ref 12.0–15.0)
Immature Granulocytes: 1 %
Lymphocytes Relative: 27 %
Lymphs Abs: 2.4 10*3/uL (ref 0.7–4.0)
MCH: 30.6 pg (ref 26.0–34.0)
MCHC: 31.7 g/dL (ref 30.0–36.0)
MCV: 96.5 fL (ref 80.0–100.0)
Monocytes Absolute: 0.8 10*3/uL (ref 0.1–1.0)
Monocytes Relative: 9 %
Neutro Abs: 5 10*3/uL (ref 1.7–7.7)
Neutrophils Relative %: 59 %
Platelet Count: 227 10*3/uL (ref 150–400)
RBC: 3.76 MIL/uL — ABNORMAL LOW (ref 3.87–5.11)
RDW: 15 % (ref 11.5–15.5)
WBC Count: 8.6 10*3/uL (ref 4.0–10.5)
nRBC: 0 % (ref 0.0–0.2)

## 2021-03-31 LAB — MAGNESIUM: Magnesium: 2 mg/dL (ref 1.7–2.4)

## 2021-03-31 MED ORDER — SODIUM CHLORIDE 0.9 % IV SOLN
10.0000 mg | Freq: Once | INTRAVENOUS | Status: AC
  Administered 2021-03-31: 10 mg via INTRAVENOUS
  Filled 2021-03-31: qty 1

## 2021-03-31 MED ORDER — SODIUM CHLORIDE 0.9 % IV SOLN
2400.0000 mg/m2 | INTRAVENOUS | Status: DC
  Administered 2021-03-31: 3850 mg via INTRAVENOUS
  Filled 2021-03-31: qty 77

## 2021-03-31 MED ORDER — SODIUM CHLORIDE 0.9 % IV SOLN: INTRAVENOUS | Status: DC

## 2021-03-31 MED ORDER — ATROPINE SULFATE 1 MG/ML IV SOLN
0.5000 mg | Freq: Once | INTRAVENOUS | Status: AC | PRN
  Administered 2021-03-31: 0.5 mg via INTRAVENOUS
  Filled 2021-03-31: qty 1

## 2021-03-31 MED ORDER — PALONOSETRON HCL INJECTION 0.25 MG/5ML
0.2500 mg | Freq: Once | INTRAVENOUS | Status: AC
  Administered 2021-03-31: 0.25 mg via INTRAVENOUS
  Filled 2021-03-31: qty 5

## 2021-03-31 MED ORDER — SODIUM CHLORIDE 0.9 % IV SOLN
400.0000 mg/m2 | Freq: Once | INTRAVENOUS | Status: AC
  Administered 2021-03-31: 644 mg via INTRAVENOUS
  Filled 2021-03-31: qty 32.2

## 2021-03-31 MED ORDER — SODIUM CHLORIDE 0.9 % IV SOLN
150.0000 mg | Freq: Once | INTRAVENOUS | Status: AC
  Administered 2021-03-31: 150 mg via INTRAVENOUS
  Filled 2021-03-31: qty 5

## 2021-03-31 MED ORDER — SODIUM CHLORIDE 0.9 % IV SOLN
125.0000 mg/m2 | Freq: Once | INTRAVENOUS | Status: AC
  Administered 2021-03-31: 200 mg via INTRAVENOUS
  Filled 2021-03-31: qty 10

## 2021-03-31 NOTE — Patient Instructions (Signed)
[facility]  The chemotherapy medication bag should finish at 46 hours, 96 hours, or 7 days. For example, if your pump is scheduled for 46 hours and it was put on at 4:00 p.m., it should finish at 2:00 p.m. the day it is scheduled to come off regardless of your appointment time.     Estimated time to finish at Wednesday, April 02, 2021.   If the display on your pump reads "Low Volume" and it is beeping, take the batteries out of the pump and come to the cancer center for it to be taken off.   If the pump alarms go off prior to the pump reading "Low Volume" then call [phone] and someone can assist you.  If the plunger comes out and the chemotherapy medication is leaking out, please use your home chemo spill kit to clean up the spill. Do NOT use paper towels or other household products.  If you have problems or questions regarding your pump, please call either [phone] (24 hours a day) or the cancer center Monday-Friday 8:00 a.m.- 4:30 p.m. at the clinic number and we will assist you. If you are unable to get assistance, then go to the nearest Emergency Department and ask the staff to contact the IV team for assistance.   Discharge Instructions: Thank you for choosing [facility] to provide your oncology and hematology care.   If you have a lab appointment with the [facility], please go directly to the [facility] and check in at the registration area.   Wear comfortable clothing and clothing appropriate for easy access to any Portacath or PICC line.   We strive to give you quality time with your provider. You may need to reschedule your appointment if you arrive late (15 or more minutes).  Arriving late affects you and other patients whose appointments are after yours.  Also, if you miss three or more appointments without notifying the office, you may be dismissed from the clinic at the providers discretion.      For prescription  refill requests, have your pharmacy contact our office and allow 72 hours for refills to be completed.    Today you received the following chemotherapy and/or immunotherapy agents Irinotecan, Leucovorin, Fluorouracil      To help prevent nausea and vomiting after your treatment, we encourage you to take your nausea medication as directed.  BELOW ARE SYMPTOMS THAT SHOULD BE REPORTED IMMEDIATELY: *FEVER GREATER THAN 100.4 F (38 C) OR HIGHER *CHILLS OR SWEATING *NAUSEA AND VOMITING THAT IS NOT CONTROLLED WITH YOUR NAUSEA MEDICATION *UNUSUAL SHORTNESS OF BREATH *UNUSUAL BRUISING OR BLEEDING *URINARY PROBLEMS (pain or burning when urinating, or frequent urination) *BOWEL PROBLEMS (unusual diarrhea, constipation, pain near the anus) TENDERNESS IN MOUTH AND THROAT WITH OR WITHOUT PRESENCE OF ULCERS (sore throat, sores in mouth, or a toothache) UNUSUAL RASH, SWELLING OR PAIN  UNUSUAL VAGINAL DISCHARGE OR ITCHING   Items with * indicate a potential emergency and should be followed up as soon as possible or go to the Emergency Department if any problems should occur.  Please show the CHEMOTHERAPY ALERT CARD or IMMUNOTHERAPY ALERT CARD at check-in to the Emergency Department and triage nurse.  Should you have questions after your visit or need to cancel or reschedule your appointment, please contact [facility]  Dept: [phone]  and follow the prompts.  Office hours are 8:00 a.m. to 4:30 p.m. Monday - Friday. Please note that voicemails left  after 4:00 p.m. may not be returned until the following business day.  We are closed weekends and major holidays. You have access to a nurse at all times for urgent questions. Please call the main number to the clinic Dept: [phone] and follow the prompts.   For any non-urgent questions, you may also contact your provider using MyChart. We now offer e-Visits for anyone 32 and older to request care online for non-urgent symptoms.  For details visit [email or web address].   Also download the MyChart app! Go to the app store, search "MyChart", open the app, select [facility], and log in with your MyChart username and password.  Due to Covid, a mask is required upon entering the hospital/clinic. If you do not have a mask, one will be given to you upon arrival. For doctor visits, patients may have 1 support person aged 63 or older with them. For treatment visits, patients cannot have anyone with them due to current Covid guidelines and our immunocompromised population.   Irinotecan injection What is this medication? IRINOTECAN (ir in oh TEE kan ) is a chemotherapy drug. It is used to treat colon and rectal cancer. This medicine may be used for other purposes; ask your health care provider or pharmacist if you have questions. COMMON BRAND NAME(S): Camptosar What should I tell my care team before I take this medication? They need to know if you have any of these conditions: dehydration diarrhea infection (especially a virus infection such as chickenpox, cold sores, or herpes) liver disease low blood counts, like low white cell, platelet, or red cell counts low levels of calcium, magnesium, or potassium in the blood recent or ongoing radiation therapy an unusual or allergic reaction to irinotecan, other medicines, foods, dyes, or preservatives pregnant or trying to get pregnant breast-feeding How should I use this medication? This drug is given as an infusion into a vein. It is administered in a hospital or clinic by a specially trained health care professional. Talk to your pediatrician regarding the use of this medicine in children. Special care may be needed. Overdosage: If you think you have taken too much of this medicine contact a poison control center or emergency room at once. NOTE: This medicine is only for you. Do not share this medicine with others. What if I miss a dose? It is important not to miss your  dose. Call your doctor or health care professional if you are unable to keep an appointment. What may interact with this medication? Do not take this medicine with any of the following medications: cobicistat itraconazole This medicine may interact with the following medications: antiviral medicines for HIV or AIDS certain antibiotics like rifampin or rifabutin certain medicines for fungal infections like ketoconazole, posaconazole, and voriconazole certain medicines for seizures like carbamazepine, phenobarbital, phenotoin clarithromycin gemfibrozil nefazodone [facility]. John's Wort This list may not describe all possible interactions. Give your health care provider a list of all the medicines, herbs, non-prescription drugs, or dietary supplements you use. Also tell them if you smoke, drink alcohol, or use illegal drugs. Some items may interact with your medicine. What should I watch for while using this medication? Your condition will be monitored carefully while you are receiving this medicine. You will need important blood work done while you are taking this medicine. This drug may make you feel generally unwell. This is not uncommon, as chemotherapy can affect healthy cells as well as cancer cells. Report any side effects. Continue your course of treatment even though you feel  ill unless your doctor tells you to stop. In some cases, you may be given additional medicines to help with side effects. Follow all directions for their use. You may get drowsy or dizzy. Do not drive, use machinery, or do anything that needs mental alertness until you know how this medicine affects you. Do not stand or sit up quickly, especially if you are an older patient. This reduces the risk of dizzy or fainting spells. Call your health care professional for advice if you get a fever, chills, or sore throat, or other symptoms of a cold or flu. Do not treat yourself. This medicine decreases your body's ability to fight  infections. Try to avoid being around people who are sick. Avoid taking products that contain aspirin, acetaminophen, ibuprofen, naproxen, or ketoprofen unless instructed by your doctor. These medicines may hide a fever. This medicine may increase your risk to bruise or bleed. Call your doctor or health care professional if you notice any unusual bleeding. Be careful brushing and flossing your teeth or using a toothpick because you may get an infection or bleed more easily. If you have any dental work done, tell your dentist you are receiving this medicine. Do not become pregnant while taking this medicine or for 6 months after stopping it. Women should inform their health care professional if they wish to become pregnant or think they might be pregnant. Men should not father a child while taking this medicine and for 3 months after stopping it. There is potential for serious side effects to an unborn child. Talk to your health care professional for more information. Do not breast-feed an infant while taking this medicine or for 7 days after stopping it. This medicine has caused ovarian failure in some women. This medicine may make it more difficult to get pregnant. Talk to your health care professional if you are concerned about your fertility. This medicine has caused decreased sperm counts in some men. This may make it more difficult to father a child. Talk to your health care professional if you are concerned about your fertility. What side effects may I notice from receiving this medication? Side effects that you should report to your doctor or health care professional as soon as possible: allergic reactions like skin rash, itching or hives, swelling of the face, lips, or tongue chest pain diarrhea flushing, runny nose, sweating during infusion low blood counts - this medicine may decrease the number of white blood cells, red blood cells and platelets. You may be at increased risk for infections  and bleeding. nausea, vomiting pain, swelling, warmth in the leg signs of decreased platelets or bleeding - bruising, pinpoint red spots on the skin, black, tarry stools, blood in the urine signs of infection - fever or chills, cough, sore throat, pain or difficulty passing urine signs of decreased red blood cells - unusually weak or tired, fainting spells, lightheadedness Side effects that usually do not require medical attention (report to your doctor or health care professional if they continue or are bothersome): constipation hair loss headache loss of appetite mouth sores stomach pain This list may not describe all possible side effects. Call your doctor for medical advice about side effects. You may report side effects to FDA at 1-800-FDA-1088. Where should I keep my medication? This drug is given in a hospital or clinic and will not be stored at home. NOTE: This sheet is a summary. It may not cover all possible information. If you have questions about this medicine, talk  to your doctor, pharmacist, or health care provider.  2022 Elsevier/Gold Standard (2020-11-05 00:00:00)  Leucovorin injection What is this medication? LEUCOVORIN (loo koe VOR in) is used to prevent or treat the harmful effects of some medicines. This medicine is used to treat anemia caused by a low amount of folic acid in the body. It is also used with 5-fluorouracil (5-FU) to treat colon cancer. This medicine may be used for other purposes; ask your health care provider or pharmacist if you have questions. What should I tell my care team before I take this medication? They need to know if you have any of these conditions: anemia from low levels of vitamin B-12 in the blood an unusual or allergic reaction to leucovorin, folic acid, other medicines, foods, dyes, or preservatives pregnant or trying to get pregnant breast-feeding How should I use this medication? This medicine is for injection into a muscle or into  a vein. It is given by a health care professional in a hospital or clinic setting. Talk to your pediatrician regarding the use of this medicine in children. Special care may be needed. Overdosage: If you think you have taken too much of this medicine contact a poison control center or emergency room at once. NOTE: This medicine is only for you. Do not share this medicine with others. What if I miss a dose? This does not apply. What may interact with this medication? capecitabine fluorouracil phenobarbital phenytoin primidone trimethoprim-sulfamethoxazole This list may not describe all possible interactions. Give your health care provider a list of all the medicines, herbs, non-prescription drugs, or dietary supplements you use. Also tell them if you smoke, drink alcohol, or use illegal drugs. Some items may interact with your medicine. What should I watch for while using this medication? Your condition will be monitored carefully while you are receiving this medicine. This medicine may increase the side effects of 5-fluorouracil, 5-FU. Tell your doctor or health care professional if you have diarrhea or mouth sores that do not get better or that get worse. What side effects may I notice from receiving this medication? Side effects that you should report to your doctor or health care professional as soon as possible: allergic reactions like skin rash, itching or hives, swelling of the face, lips, or tongue breathing problems fever, infection mouth sores unusual bleeding or bruising unusually weak or tired Side effects that usually do not require medical attention (report to your doctor or health care professional if they continue or are bothersome): constipation or diarrhea loss of appetite nausea, vomiting This list may not describe all possible side effects. Call your doctor for medical advice about side effects. You may report side effects to FDA at 1-800-FDA-1088. Where should I keep  my medication? This drug is given in a hospital or clinic and will not be stored at home. NOTE: This sheet is a summary. It may not cover all possible information. If you have questions about this medicine, talk to your doctor, pharmacist, or health care provider.  2022 Elsevier/Gold Standard (2007-08-25 00:00:00)  Fluorouracil, 5-FU injection What is this medication? FLUOROURACIL, 5-FU (flure oh YOOR a sil) is a chemotherapy drug. It slows the growth of cancer cells. This medicine is used to treat many types of cancer like breast cancer, colon or rectal cancer, pancreatic cancer, and stomach cancer. This medicine may be used for other purposes; ask your health care provider or pharmacist if you have questions. COMMON BRAND NAME(S): Adrucil What should I tell my care team before  I take this medication? They need to know if you have any of these conditions: blood disorders dihydropyrimidine dehydrogenase (DPD) deficiency infection (especially a virus infection such as chickenpox, cold sores, or herpes) kidney disease liver disease malnourished, poor nutrition recent or ongoing radiation therapy an unusual or allergic reaction to fluorouracil, other chemotherapy, other medicines, foods, dyes, or preservatives pregnant or trying to get pregnant breast-feeding How should I use this medication? This drug is given as an infusion or injection into a vein. It is administered in a hospital or clinic by a specially trained health care professional. Talk to your pediatrician regarding the use of this medicine in children. Special care may be needed. Overdosage: If you think you have taken too much of this medicine contact a poison control center or emergency room at once. NOTE: This medicine is only for you. Do not share this medicine with others. What if I miss a dose? It is important not to miss your dose. Call your doctor or health care professional if you are unable to keep an  appointment. What may interact with this medication? Do not take this medicine with any of the following medications: live virus vaccines This medicine may also interact with the following medications: medicines that treat or prevent blood clots like warfarin, enoxaparin, and dalteparin This list may not describe all possible interactions. Give your health care provider a list of all the medicines, herbs, non-prescription drugs, or dietary supplements you use. Also tell them if you smoke, drink alcohol, or use illegal drugs. Some items may interact with your medicine. What should I watch for while using this medication? Visit your doctor for checks on your progress. This drug may make you feel generally unwell. This is not uncommon, as chemotherapy can affect healthy cells as well as cancer cells. Report any side effects. Continue your course of treatment even though you feel ill unless your doctor tells you to stop. In some cases, you may be given additional medicines to help with side effects. Follow all directions for their use. Call your doctor or health care professional for advice if you get a fever, chills or sore throat, or other symptoms of a cold or flu. Do not treat yourself. This drug decreases your body's ability to fight infections. Try to avoid being around people who are sick. This medicine may increase your risk to bruise or bleed. Call your doctor or health care professional if you notice any unusual bleeding. Be careful brushing and flossing your teeth or using a toothpick because you may get an infection or bleed more easily. If you have any dental work done, tell your dentist you are receiving this medicine. Avoid taking products that contain aspirin, acetaminophen, ibuprofen, naproxen, or ketoprofen unless instructed by your doctor. These medicines may hide a fever. Do not become pregnant while taking this medicine. Women should inform their doctor if they wish to become pregnant  or think they might be pregnant. There is a potential for serious side effects to an unborn child. Talk to your health care professional or pharmacist for more information. Do not breast-feed an infant while taking this medicine. Men should inform their doctor if they wish to father a child. This medicine may lower sperm counts. Do not treat diarrhea with over the counter products. Contact your doctor if you have diarrhea that lasts more than 2 days or if it is severe and watery. This medicine can make you more sensitive to the sun. Keep out of the sun.  If you cannot avoid being in the sun, wear protective clothing and use sunscreen. Do not use sun lamps or tanning beds/booths. What side effects may I notice from receiving this medication? Side effects that you should report to your doctor or health care professional as soon as possible: allergic reactions like skin rash, itching or hives, swelling of the face, lips, or tongue low blood counts - this medicine may decrease the number of white blood cells, red blood cells and platelets. You may be at increased risk for infections and bleeding. signs of infection - fever or chills, cough, sore throat, pain or difficulty passing urine signs of decreased platelets or bleeding - bruising, pinpoint red spots on the skin, black, tarry stools, blood in the urine signs of decreased red blood cells - unusually weak or tired, fainting spells, lightheadedness breathing problems changes in vision chest pain mouth sores nausea and vomiting pain, swelling, redness at site where injected pain, tingling, numbness in the hands or feet redness, swelling, or sores on hands or feet stomach pain unusual bleeding Side effects that usually do not require medical attention (report to your doctor or health care professional if they continue or are bothersome): changes in finger or toe nails diarrhea dry or itchy skin hair loss headache loss of appetite sensitivity  of eyes to the light stomach upset unusually teary eyes This list may not describe all possible side effects. Call your doctor for medical advice about side effects. You may report side effects to FDA at 1-800-FDA-1088. Where should I keep my medication? This drug is given in a hospital or clinic and will not be stored at home. NOTE: This sheet is a summary. It may not cover all possible information. If you have questions about this medicine, talk to your doctor, pharmacist, or health care provider.  2022 Elsevier/Gold Standard (2020-11-05 00:00:00)

## 2021-03-31 NOTE — Progress Notes (Signed)
[facility] OFFICE PROGRESS NOTE   Diagnosis: Pancreas cancer  INTERVAL HISTORY:   Ms. Mabee completed on cycle FOLFIRI 03/17/2021.  No nausea/vomiting, diarrhea, or neuropathy symptoms.  No new complaint.  Objective:  Vital signs in last 24 hours:  Blood pressure (!) 160/87, pulse 96, temperature 98 F (36.7 C), temperature source Oral, resp. rate 18, height 5\' 3"  (1.6 m), weight 135 lb 12.8 oz (61.6 kg), SpO2 100 %.    HEENT: No thrush or ulcers Resp: Lungs clear bilaterally Cardio: Regular rate and rhythm GI: No hepatosplenomegaly, nontender Vascular: No leg edema   Portacath/PICC-without erythema  Lab Results:  Lab Results  Component Value Date   WBC 8.6 03/31/2021   HGB 11.5 (L) 03/31/2021   HCT 36.3 03/31/2021   MCV 96.5 03/31/2021   PLT 227 03/31/2021   NEUTROABS 5.0 03/31/2021    CMP  Lab Results  Component Value Date   NA 140 03/17/2021   K 3.4 (L) 03/17/2021   CL 105 03/17/2021   CO2 28 03/17/2021   GLUCOSE 111 (H) 03/17/2021   BUN 6 (L) 03/17/2021   CREATININE 0.83 03/17/2021   CALCIUM 10.0 03/17/2021   PROT 7.0 03/17/2021   ALBUMIN 3.8 03/17/2021   AST 30 03/17/2021   ALT 40 03/17/2021   ALKPHOS 308 (H) 03/17/2021   BILITOT 0.3 03/17/2021   GFRNONAA >60 03/17/2021    Lab Results  Component Value Date   UJW119 5,136 (H) 03/17/2021     Medications: I have reviewed the patient's current medications.   Assessment/Plan: Metastatic pancreas cancer Right upper quadrant ultrasound 06/17/2020-multiple hypoechoic liver lesions CT abdomen/pelvis 06/17/2020- pancreas tail mass, multiple liver metastases, enlarged periaortic nodes, right ovary metastasis?,  Nodularity at the left paracolic gutter and posterior/inferior stomach suggesting peritoneal tumor spread CT chest 06/17/2020- scattered small pulmonary nodules consistent with metastatic disease Ultrasound-guided biopsy of a left liver mass 06/18/2020- adenocarcinoma, morphology  compatible with metastatic pancreatic adenocarcinoma Cycle 1 FOLFOX 07/01/2020 Cycle 2 FOLFOX 07/16/2020 Cycle 3 FOLFOX 07/31/2020 Cycle 4 FOLFIRINOX 08/13/2020 Cycle 5 FOLFIRINOX 09/03/2020, Udenyca CTs 09/13/2020-unchanged nodule medial left lower lobe; stable nodal and likely peritoneal disease in the abdomen/pelvis; unchanged pancreatic tail mass; multifocal hepatic metastases, mildly progressive Cycle 6 FOLFIRINOX 09/16/2020, Oxaliplatin held Cycle 7 FOLFIRINOX 09/30/2020 Cycle 8 FOLFIRINOX 10/14/2020, oxaliplatin held Cycle 9 FOLFIRINOX 10/28/2020, oxaliplatin held Cycle 10 FOLFIRINOX 11/11/2020, oxaliplatin held Cycle 11 FOLFIRINOX 11/25/2020, oxaliplatin held CTs 12/06/2020-primary pancreatic tail tumor decreased in size.  Widespread liver metastases all decreased.  Previously visualized peritoneal nodule adjacent to the distal stomach resolved.  Left lower lobe pulmonary nodule stable.  No new or progressive metastatic disease. Cycle 12 FOLFIRI 12/09/2020 Cycle 13 FOLFIRI 12/23/2020 Cycle 14 FOLFIRI 01/06/2021 Cycle 15 FOLFIRI 01/20/2021 Cycle 16 FOLFIRI 02/03/2021 Cycle 17 FOLFIRI 02/17/2021 CTs 02/27/2021-slight decrease in size of pancreas tail mass and hypodense liver lesions, unchanged left lower lobe nodule, enlargement of a single peritoneal nodule anterior to the distal descending colon Cycle 18 FOLFIRI 03/04/2021 Cycle 19 FOLFIRI 03/17/2021 Cycle 20 FOLFIRI 03/31/2021 2.  Pain secondary to #1-improved 3.  Weight loss-improved 4.  Family history of pancreas cancer 5.  Neutropenia secondary chemotherapy-G-CSF added with cycle 5 FOLFIRINOX 6.  Oxaliplatin neuropathy-oxaliplatin held 09/16/2020, 10/14/2020, 10/28/2020, 11/11/2020, 11/25/2020      Disposition: Kirsten [name] appears stable.  She continues to tolerate FOLFIRI well.  She will complete another cycle today.  The CA 19-9 has been higher over the past month.  We will consider adding oxaliplatin back to the  chemotherapy regimen if the CA  19-9 is higher again today.  Ms. Winthrop will return for an office visit and chemotherapy in 2 weeks  [name], MD  03/31/2021  9:06 AM

## 2021-03-31 NOTE — Progress Notes (Signed)
Patient presents for treatment. RN assessment completed along with the following:  Labs/vitals reviewed - Yes, and within treatment parameters.   Weight within 10% of previous measurement - Yes Informed consent completed and reflects current therapy/intent - Yes, on date 07/01/20             Provider progress note reviewed - Yes, today's provider note was reviewed. Treatment/Antibody/Supportive plan reviewed - Yes, and there are no adjustments needed for today's treatment. S&H and other orders reviewed - Yes, and there are no additional orders identified. Previous treatment date reviewed - Yes, and the appropriate amount of time has elapsed between treatments. Clinic Hand Off Received from - Danae Orleans, LPN  Patient to proceed with treatment.

## 2021-03-31 NOTE — Progress Notes (Signed)
Patient seen by Dr. Sherrill today ? ?Vitals are within treatment parameters. ? ?Labs reviewed by Dr. Sherrill and are within treatment parameters. ? ?Per physician team, patient is ready for treatment and there are NO modifications to the treatment plan.  ?

## 2021-04-01 LAB — CANCER ANTIGEN 19-9: CA 19-9: 6369 U/mL — ABNORMAL HIGH (ref 0–35)

## 2021-04-02 ENCOUNTER — Inpatient Hospital Stay: Payer: Medicaid Other | Attending: Oncology

## 2021-04-02 ENCOUNTER — Other Ambulatory Visit: Payer: Self-pay

## 2021-04-02 VITALS — BP 149/80 | HR 77 | Temp 98.3°F | Resp 18

## 2021-04-02 DIAGNOSIS — C787 Secondary malignant neoplasm of liver and intrahepatic bile duct: Secondary | ICD-10-CM | POA: Diagnosis not present

## 2021-04-02 DIAGNOSIS — C7800 Secondary malignant neoplasm of unspecified lung: Secondary | ICD-10-CM | POA: Diagnosis not present

## 2021-04-02 DIAGNOSIS — Z5111 Encounter for antineoplastic chemotherapy: Secondary | ICD-10-CM | POA: Insufficient documentation

## 2021-04-02 DIAGNOSIS — D701 Agranulocytosis secondary to cancer chemotherapy: Secondary | ICD-10-CM | POA: Diagnosis not present

## 2021-04-02 DIAGNOSIS — T451X5A Adverse effect of antineoplastic and immunosuppressive drugs, initial encounter: Secondary | ICD-10-CM | POA: Diagnosis not present

## 2021-04-02 DIAGNOSIS — C252 Malignant neoplasm of tail of pancreas: Secondary | ICD-10-CM | POA: Diagnosis present

## 2021-04-02 MED ORDER — PEGFILGRASTIM-BMEZ 6 MG/0.6ML ~~LOC~~ SOSY
6.0000 mg | PREFILLED_SYRINGE | Freq: Once | SUBCUTANEOUS | Status: AC
  Administered 2021-04-02: 6 mg via SUBCUTANEOUS
  Filled 2021-04-02: qty 0.6

## 2021-04-02 MED ORDER — SODIUM CHLORIDE 0.9% FLUSH
10.0000 mL | INTRAVENOUS | Status: DC | PRN
  Administered 2021-04-02: 10 mL

## 2021-04-02 MED ORDER — HEPARIN SOD (PORK) LOCK FLUSH 100 UNIT/ML IV SOLN
500.0000 [IU] | Freq: Once | INTRAVENOUS | Status: AC | PRN
  Administered 2021-04-02: 500 [IU]

## 2021-04-02 NOTE — Patient Instructions (Signed)

## 2021-04-13 ENCOUNTER — Other Ambulatory Visit: Payer: Self-pay | Admitting: Oncology

## 2021-04-14 ENCOUNTER — Inpatient Hospital Stay: Payer: Medicaid Other

## 2021-04-14 ENCOUNTER — Inpatient Hospital Stay (HOSPITAL_BASED_OUTPATIENT_CLINIC_OR_DEPARTMENT_OTHER): Payer: Medicaid Other | Admitting: Nurse Practitioner

## 2021-04-14 ENCOUNTER — Encounter: Payer: Self-pay | Admitting: *Deleted

## 2021-04-14 ENCOUNTER — Other Ambulatory Visit: Payer: Self-pay

## 2021-04-14 ENCOUNTER — Encounter: Payer: Self-pay | Admitting: Nurse Practitioner

## 2021-04-14 VITALS — BP 157/84 | HR 72 | Temp 98.0°F | Resp 19 | Ht 63.0 in | Wt 133.4 lb

## 2021-04-14 DIAGNOSIS — C252 Malignant neoplasm of tail of pancreas: Secondary | ICD-10-CM

## 2021-04-14 DIAGNOSIS — Z5111 Encounter for antineoplastic chemotherapy: Secondary | ICD-10-CM | POA: Diagnosis not present

## 2021-04-14 DIAGNOSIS — C787 Secondary malignant neoplasm of liver and intrahepatic bile duct: Secondary | ICD-10-CM

## 2021-04-14 LAB — CMP (CANCER CENTER ONLY)
ALT: 25 U/L (ref 0–44)
AST: 23 U/L (ref 15–41)
Albumin: 3.8 g/dL (ref 3.5–5.0)
Alkaline Phosphatase: 297 U/L — ABNORMAL HIGH (ref 38–126)
Anion gap: 9 (ref 5–15)
BUN: 8 mg/dL (ref 8–23)
CO2: 27 mmol/L (ref 22–32)
Calcium: 9.7 mg/dL (ref 8.9–10.3)
Chloride: 104 mmol/L (ref 98–111)
Creatinine: 0.91 mg/dL (ref 0.44–1.00)
GFR, Estimated: >60 mL/min (ref >60)
Glucose, Bld: 143 mg/dL — ABNORMAL HIGH (ref 70–99)
Potassium: 3.4 mmol/L — ABNORMAL LOW (ref 3.5–5.1)
Sodium: 140 mmol/L (ref 135–145)
Total Bilirubin: 0.3 mg/dL (ref 0.3–1.2)
Total Protein: 6.9 g/dL (ref 6.5–8.1)

## 2021-04-14 LAB — CBC WITH DIFFERENTIAL (CANCER CENTER ONLY)
Abs Immature Granulocytes: 0.03 10*3/uL (ref 0.00–0.07)
Basophils Absolute: 0.1 10*3/uL (ref 0.0–0.1)
Basophils Relative: 1 %
Eosinophils Absolute: 0.3 10*3/uL (ref 0.0–0.5)
Eosinophils Relative: 4 %
HCT: 36.1 % (ref 36.0–46.0)
Hemoglobin: 11.6 g/dL — ABNORMAL LOW (ref 12.0–15.0)
Immature Granulocytes: 0 %
Lymphocytes Relative: 33 %
Lymphs Abs: 2.3 10*3/uL (ref 0.7–4.0)
MCH: 30.1 pg (ref 26.0–34.0)
MCHC: 32.1 g/dL (ref 30.0–36.0)
MCV: 93.8 fL (ref 80.0–100.0)
Monocytes Absolute: 1 10*3/uL (ref 0.1–1.0)
Monocytes Relative: 14 %
Neutro Abs: 3.4 10*3/uL (ref 1.7–7.7)
Neutrophils Relative %: 48 %
Platelet Count: 251 10*3/uL (ref 150–400)
RBC: 3.85 MIL/uL — ABNORMAL LOW (ref 3.87–5.11)
RDW: 14.6 % (ref 11.5–15.5)
WBC Count: 7 10*3/uL (ref 4.0–10.5)
nRBC: 0 % (ref 0.0–0.2)

## 2021-04-14 MED ORDER — ATROPINE SULFATE 1 MG/ML IV SOLN
0.5000 mg | Freq: Once | INTRAVENOUS | Status: AC | PRN
  Administered 2021-04-14: 0.5 mg via INTRAVENOUS
  Filled 2021-04-14: qty 1

## 2021-04-14 MED ORDER — SODIUM CHLORIDE 0.9 % IV SOLN
400.0000 mg/m2 | Freq: Once | INTRAVENOUS | Status: AC
  Administered 2021-04-14: 644 mg via INTRAVENOUS
  Filled 2021-04-14: qty 32.2

## 2021-04-14 MED ORDER — SODIUM CHLORIDE 0.9 % IV SOLN
125.0000 mg/m2 | Freq: Once | INTRAVENOUS | Status: AC
  Administered 2021-04-14: 200 mg via INTRAVENOUS
  Filled 2021-04-14: qty 10

## 2021-04-14 MED ORDER — PALONOSETRON HCL INJECTION 0.25 MG/5ML
0.2500 mg | Freq: Once | INTRAVENOUS | Status: AC
  Administered 2021-04-14: 0.25 mg via INTRAVENOUS
  Filled 2021-04-14: qty 5

## 2021-04-14 MED ORDER — SODIUM CHLORIDE 0.9 % IV SOLN
150.0000 mg | Freq: Once | INTRAVENOUS | Status: AC
  Administered 2021-04-14: 150 mg via INTRAVENOUS
  Filled 2021-04-14: qty 5

## 2021-04-14 MED ORDER — SODIUM CHLORIDE 0.9 % IV SOLN
10.0000 mg | Freq: Once | INTRAVENOUS | Status: AC
  Administered 2021-04-14: 10 mg via INTRAVENOUS
  Filled 2021-04-14: qty 1

## 2021-04-14 MED ORDER — SODIUM CHLORIDE 0.9 % IV SOLN
2400.0000 mg/m2 | INTRAVENOUS | Status: DC
  Administered 2021-04-14: 3850 mg via INTRAVENOUS
  Filled 2021-04-14: qty 77

## 2021-04-14 MED ORDER — SODIUM CHLORIDE 0.9 % IV SOLN: INTRAVENOUS | Status: DC

## 2021-04-14 NOTE — Progress Notes (Signed)
Patient seen by Dr. Sherrill today ? ?Vitals are within treatment parameters. ? ?Labs reviewed by Dr. Sherrill and are within treatment parameters. ? ?Per physician team, patient is ready for treatment and there are NO modifications to the treatment plan.  ?

## 2021-04-14 NOTE — Progress Notes (Signed)
Patient presents for treatment. RN assessment completed along with the following:  Labs/vitals reviewed - Yes, and within treatment parameters.   Weight within 10% of previous measurement - Yes Informed consent completed and reflects current therapy/intent - Yes, on date 07/01/20             Provider progress note reviewed - Yes, today's provider note was reviewed. Treatment/Antibody/Supportive plan reviewed - Yes, and there are no adjustments needed for today's treatment. S&H and other orders reviewed - Yes, and there are no additional orders identified. Previous treatment date reviewed - Yes, and the appropriate amount of time has elapsed between treatments. Clinic Hand Off Received from - Cristy Friedlander, RN  Patient to proceed with treatment.

## 2021-04-14 NOTE — Patient Instructions (Signed)
[facility]  The chemotherapy medication bag should finish at 46 hours, 96 hours, or 7 days. For example, if your pump is scheduled for 46 hours and it was put on at 4:00 p.m., it should finish at 2:00 p.m. the day it is scheduled to come off regardless of your appointment time.     Estimated time to finish at Wednesday, April 16, 2021.   If the display on your pump reads "Low Volume" and it is beeping, take the batteries out of the pump and come to the cancer center for it to be taken off.   If the pump alarms go off prior to the pump reading "Low Volume" then call [phone] and someone can assist you.  If the plunger comes out and the chemotherapy medication is leaking out, please use your home chemo spill kit to clean up the spill. Do NOT use paper towels or other household products.  If you have problems or questions regarding your pump, please call either [phone] (24 hours a day) or the cancer center Monday-Friday 8:00 a.m.- 4:30 p.m. at the clinic number and we will assist you. If you are unable to get assistance, then go to the nearest Emergency Department and ask the staff to contact the IV team for assistance.   Discharge Instructions: Thank you for choosing [facility] to provide your oncology and hematology care.   If you have a lab appointment with the [facility], please go directly to the [facility] and check in at the registration area.   Wear comfortable clothing and clothing appropriate for easy access to any Portacath or PICC line.   We strive to give you quality time with your provider. You may need to reschedule your appointment if you arrive late (15 or more minutes).  Arriving late affects you and other patients whose appointments are after yours.  Also, if you miss three or more appointments without notifying the office, you may be dismissed from the clinic at the providers discretion.      For prescription  refill requests, have your pharmacy contact our office and allow 72 hours for refills to be completed.    Today you received the following chemotherapy and/or immunotherapy agents Irinotecan, Leucovorin, Fluorouracil      To help prevent nausea and vomiting after your treatment, we encourage you to take your nausea medication as directed.  BELOW ARE SYMPTOMS THAT SHOULD BE REPORTED IMMEDIATELY: *FEVER GREATER THAN 100.4 F (38 C) OR HIGHER *CHILLS OR SWEATING *NAUSEA AND VOMITING THAT IS NOT CONTROLLED WITH YOUR NAUSEA MEDICATION *UNUSUAL SHORTNESS OF BREATH *UNUSUAL BRUISING OR BLEEDING *URINARY PROBLEMS (pain or burning when urinating, or frequent urination) *BOWEL PROBLEMS (unusual diarrhea, constipation, pain near the anus) TENDERNESS IN MOUTH AND THROAT WITH OR WITHOUT PRESENCE OF ULCERS (sore throat, sores in mouth, or a toothache) UNUSUAL RASH, SWELLING OR PAIN  UNUSUAL VAGINAL DISCHARGE OR ITCHING   Items with * indicate a potential emergency and should be followed up as soon as possible or go to the Emergency Department if any problems should occur.  Please show the CHEMOTHERAPY ALERT CARD or IMMUNOTHERAPY ALERT CARD at check-in to the Emergency Department and triage nurse.  Should you have questions after your visit or need to cancel or reschedule your appointment, please contact [facility]  Dept: [phone]  and follow the prompts.  Office hours are 8:00 a.m. to 4:30 p.m. Monday - Friday. Please note that voicemails left  after 4:00 p.m. may not be returned until the following business day.  We are closed weekends and major holidays. You have access to a nurse at all times for urgent questions. Please call the main number to the clinic Dept: [phone] and follow the prompts.   For any non-urgent questions, you may also contact your provider using MyChart. We now offer e-Visits for anyone 32 and older to request care online for non-urgent symptoms.  For details visit [email or web address].   Also download the MyChart app! Go to the app store, search "MyChart", open the app, select [facility], and log in with your MyChart username and password.  Due to Covid, a mask is required upon entering the hospital/clinic. If you do not have a mask, one will be given to you upon arrival. For doctor visits, patients may have 1 support person aged 48 or older with them. For treatment visits, patients cannot have anyone with them due to current Covid guidelines and our immunocompromised population.   Irinotecan injection What is this medication? IRINOTECAN (ir in oh TEE kan ) is a chemotherapy drug. It is used to treat colon and rectal cancer. This medicine may be used for other purposes; ask your health care provider or pharmacist if you have questions. COMMON BRAND NAME(S): Camptosar What should I tell my care team before I take this medication? They need to know if you have any of these conditions: dehydration diarrhea infection (especially a virus infection such as chickenpox, cold sores, or herpes) liver disease low blood counts, like low white cell, platelet, or red cell counts low levels of calcium, magnesium, or potassium in the blood recent or ongoing radiation therapy an unusual or allergic reaction to irinotecan, other medicines, foods, dyes, or preservatives pregnant or trying to get pregnant breast-feeding How should I use this medication? This drug is given as an infusion into a vein. It is administered in a hospital or clinic by a specially trained health care professional. Talk to your pediatrician regarding the use of this medicine in children. Special care may be needed. Overdosage: If you think you have taken too much of this medicine contact a poison control center or emergency room at once. NOTE: This medicine is only for you. Do not share this medicine with others. What if I miss a dose? It is important not to miss your  dose. Call your doctor or health care professional if you are unable to keep an appointment. What may interact with this medication? Do not take this medicine with any of the following medications: cobicistat itraconazole This medicine may interact with the following medications: antiviral medicines for HIV or AIDS certain antibiotics like rifampin or rifabutin certain medicines for fungal infections like ketoconazole, posaconazole, and voriconazole certain medicines for seizures like carbamazepine, phenobarbital, phenotoin clarithromycin gemfibrozil nefazodone [facility]. John's Wort This list may not describe all possible interactions. Give your health care provider a list of all the medicines, herbs, non-prescription drugs, or dietary supplements you use. Also tell them if you smoke, drink alcohol, or use illegal drugs. Some items may interact with your medicine. What should I watch for while using this medication? Your condition will be monitored carefully while you are receiving this medicine. You will need important blood work done while you are taking this medicine. This drug may make you feel generally unwell. This is not uncommon, as chemotherapy can affect healthy cells as well as cancer cells. Report any side effects. Continue your course of treatment even though you feel  ill unless your doctor tells you to stop. In some cases, you may be given additional medicines to help with side effects. Follow all directions for their use. You may get drowsy or dizzy. Do not drive, use machinery, or do anything that needs mental alertness until you know how this medicine affects you. Do not stand or sit up quickly, especially if you are an older patient. This reduces the risk of dizzy or fainting spells. Call your health care professional for advice if you get a fever, chills, or sore throat, or other symptoms of a cold or flu. Do not treat yourself. This medicine decreases your body's ability to fight  infections. Try to avoid being around people who are sick. Avoid taking products that contain aspirin, acetaminophen, ibuprofen, naproxen, or ketoprofen unless instructed by your doctor. These medicines may hide a fever. This medicine may increase your risk to bruise or bleed. Call your doctor or health care professional if you notice any unusual bleeding. Be careful brushing and flossing your teeth or using a toothpick because you may get an infection or bleed more easily. If you have any dental work done, tell your dentist you are receiving this medicine. Do not become pregnant while taking this medicine or for 6 months after stopping it. Women should inform their health care professional if they wish to become pregnant or think they might be pregnant. Men should not father a child while taking this medicine and for 3 months after stopping it. There is potential for serious side effects to an unborn child. Talk to your health care professional for more information. Do not breast-feed an infant while taking this medicine or for 7 days after stopping it. This medicine has caused ovarian failure in some women. This medicine may make it more difficult to get pregnant. Talk to your health care professional if you are concerned about your fertility. This medicine has caused decreased sperm counts in some men. This may make it more difficult to father a child. Talk to your health care professional if you are concerned about your fertility. What side effects may I notice from receiving this medication? Side effects that you should report to your doctor or health care professional as soon as possible: allergic reactions like skin rash, itching or hives, swelling of the face, lips, or tongue chest pain diarrhea flushing, runny nose, sweating during infusion low blood counts - this medicine may decrease the number of white blood cells, red blood cells and platelets. You may be at increased risk for infections  and bleeding. nausea, vomiting pain, swelling, warmth in the leg signs of decreased platelets or bleeding - bruising, pinpoint red spots on the skin, black, tarry stools, blood in the urine signs of infection - fever or chills, cough, sore throat, pain or difficulty passing urine signs of decreased red blood cells - unusually weak or tired, fainting spells, lightheadedness Side effects that usually do not require medical attention (report to your doctor or health care professional if they continue or are bothersome): constipation hair loss headache loss of appetite mouth sores stomach pain This list may not describe all possible side effects. Call your doctor for medical advice about side effects. You may report side effects to FDA at 1-800-FDA-1088. Where should I keep my medication? This drug is given in a hospital or clinic and will not be stored at home. NOTE: This sheet is a summary. It may not cover all possible information. If you have questions about this medicine, talk  to your doctor, pharmacist, or health care provider.  2022 Elsevier/Gold Standard (2020-11-05 00:00:00)  Leucovorin injection What is this medication? LEUCOVORIN (loo koe VOR in) is used to prevent or treat the harmful effects of some medicines. This medicine is used to treat anemia caused by a low amount of folic acid in the body. It is also used with 5-fluorouracil (5-FU) to treat colon cancer. This medicine may be used for other purposes; ask your health care provider or pharmacist if you have questions. What should I tell my care team before I take this medication? They need to know if you have any of these conditions: anemia from low levels of vitamin B-12 in the blood an unusual or allergic reaction to leucovorin, folic acid, other medicines, foods, dyes, or preservatives pregnant or trying to get pregnant breast-feeding How should I use this medication? This medicine is for injection into a muscle or into  a vein. It is given by a health care professional in a hospital or clinic setting. Talk to your pediatrician regarding the use of this medicine in children. Special care may be needed. Overdosage: If you think you have taken too much of this medicine contact a poison control center or emergency room at once. NOTE: This medicine is only for you. Do not share this medicine with others. What if I miss a dose? This does not apply. What may interact with this medication? capecitabine fluorouracil phenobarbital phenytoin primidone trimethoprim-sulfamethoxazole This list may not describe all possible interactions. Give your health care provider a list of all the medicines, herbs, non-prescription drugs, or dietary supplements you use. Also tell them if you smoke, drink alcohol, or use illegal drugs. Some items may interact with your medicine. What should I watch for while using this medication? Your condition will be monitored carefully while you are receiving this medicine. This medicine may increase the side effects of 5-fluorouracil, 5-FU. Tell your doctor or health care professional if you have diarrhea or mouth sores that do not get better or that get worse. What side effects may I notice from receiving this medication? Side effects that you should report to your doctor or health care professional as soon as possible: allergic reactions like skin rash, itching or hives, swelling of the face, lips, or tongue breathing problems fever, infection mouth sores unusual bleeding or bruising unusually weak or tired Side effects that usually do not require medical attention (report to your doctor or health care professional if they continue or are bothersome): constipation or diarrhea loss of appetite nausea, vomiting This list may not describe all possible side effects. Call your doctor for medical advice about side effects. You may report side effects to FDA at 1-800-FDA-1088. Where should I keep  my medication? This drug is given in a hospital or clinic and will not be stored at home. NOTE: This sheet is a summary. It may not cover all possible information. If you have questions about this medicine, talk to your doctor, pharmacist, or health care provider.  2022 Elsevier/Gold Standard (2007-08-25 00:00:00)  Fluorouracil, 5-FU injection What is this medication? FLUOROURACIL, 5-FU (flure oh YOOR a sil) is a chemotherapy drug. It slows the growth of cancer cells. This medicine is used to treat many types of cancer like breast cancer, colon or rectal cancer, pancreatic cancer, and stomach cancer. This medicine may be used for other purposes; ask your health care provider or pharmacist if you have questions. COMMON BRAND NAME(S): Adrucil What should I tell my care team before  I take this medication? They need to know if you have any of these conditions: blood disorders dihydropyrimidine dehydrogenase (DPD) deficiency infection (especially a virus infection such as chickenpox, cold sores, or herpes) kidney disease liver disease malnourished, poor nutrition recent or ongoing radiation therapy an unusual or allergic reaction to fluorouracil, other chemotherapy, other medicines, foods, dyes, or preservatives pregnant or trying to get pregnant breast-feeding How should I use this medication? This drug is given as an infusion or injection into a vein. It is administered in a hospital or clinic by a specially trained health care professional. Talk to your pediatrician regarding the use of this medicine in children. Special care may be needed. Overdosage: If you think you have taken too much of this medicine contact a poison control center or emergency room at once. NOTE: This medicine is only for you. Do not share this medicine with others. What if I miss a dose? It is important not to miss your dose. Call your doctor or health care professional if you are unable to keep an  appointment. What may interact with this medication? Do not take this medicine with any of the following medications: live virus vaccines This medicine may also interact with the following medications: medicines that treat or prevent blood clots like warfarin, enoxaparin, and dalteparin This list may not describe all possible interactions. Give your health care provider a list of all the medicines, herbs, non-prescription drugs, or dietary supplements you use. Also tell them if you smoke, drink alcohol, or use illegal drugs. Some items may interact with your medicine. What should I watch for while using this medication? Visit your doctor for checks on your progress. This drug may make you feel generally unwell. This is not uncommon, as chemotherapy can affect healthy cells as well as cancer cells. Report any side effects. Continue your course of treatment even though you feel ill unless your doctor tells you to stop. In some cases, you may be given additional medicines to help with side effects. Follow all directions for their use. Call your doctor or health care professional for advice if you get a fever, chills or sore throat, or other symptoms of a cold or flu. Do not treat yourself. This drug decreases your body's ability to fight infections. Try to avoid being around people who are sick. This medicine may increase your risk to bruise or bleed. Call your doctor or health care professional if you notice any unusual bleeding. Be careful brushing and flossing your teeth or using a toothpick because you may get an infection or bleed more easily. If you have any dental work done, tell your dentist you are receiving this medicine. Avoid taking products that contain aspirin, acetaminophen, ibuprofen, naproxen, or ketoprofen unless instructed by your doctor. These medicines may hide a fever. Do not become pregnant while taking this medicine. Women should inform their doctor if they wish to become pregnant  or think they might be pregnant. There is a potential for serious side effects to an unborn child. Talk to your health care professional or pharmacist for more information. Do not breast-feed an infant while taking this medicine. Men should inform their doctor if they wish to father a child. This medicine may lower sperm counts. Do not treat diarrhea with over the counter products. Contact your doctor if you have diarrhea that lasts more than 2 days or if it is severe and watery. This medicine can make you more sensitive to the sun. Keep out of the sun.  If you cannot avoid being in the sun, wear protective clothing and use sunscreen. Do not use sun lamps or tanning beds/booths. What side effects may I notice from receiving this medication? Side effects that you should report to your doctor or health care professional as soon as possible: allergic reactions like skin rash, itching or hives, swelling of the face, lips, or tongue low blood counts - this medicine may decrease the number of white blood cells, red blood cells and platelets. You may be at increased risk for infections and bleeding. signs of infection - fever or chills, cough, sore throat, pain or difficulty passing urine signs of decreased platelets or bleeding - bruising, pinpoint red spots on the skin, black, tarry stools, blood in the urine signs of decreased red blood cells - unusually weak or tired, fainting spells, lightheadedness breathing problems changes in vision chest pain mouth sores nausea and vomiting pain, swelling, redness at site where injected pain, tingling, numbness in the hands or feet redness, swelling, or sores on hands or feet stomach pain unusual bleeding Side effects that usually do not require medical attention (report to your doctor or health care professional if they continue or are bothersome): changes in finger or toe nails diarrhea dry or itchy skin hair loss headache loss of appetite sensitivity  of eyes to the light stomach upset unusually teary eyes This list may not describe all possible side effects. Call your doctor for medical advice about side effects. You may report side effects to FDA at 1-800-FDA-1088. Where should I keep my medication? This drug is given in a hospital or clinic and will not be stored at home. NOTE: This sheet is a summary. It may not cover all possible information. If you have questions about this medicine, talk to your doctor, pharmacist, or health care provider.  2022 Elsevier/Gold Standard (2020-11-05 00:00:00)

## 2021-04-14 NOTE — Progress Notes (Signed)
[place] OFFICE PROGRESS NOTE   Diagnosis:  Pancreas cancer  INTERVAL HISTORY:   [name] returns as scheduled.  She completed another cycle of FOLFIRI 03/31/2021.  She denies nausea/vomiting.  No mouth sores.  No diarrhea.  She notes an abnormal sensation at the fingertips.  Feet feel "gritty".  Objective:  Vital signs in last 24 hours:  Blood pressure (!) 157/84, pulse 72, temperature 98 F (36.7 C), resp. rate 19, height 5\' 3"  (1.6 m), weight 133 lb 6.4 oz (60.5 kg), SpO2 100 %.    HEENT: No thrush or ulcers. Resp: Lungs clear bilaterally. Cardio: Regular rate and rhythm. GI: Abdomen soft and nontender.  No hepatomegaly. Vascular: No leg edema. Neuro: Vibratory sense moderately decreased over the fingertips per tuning fork exam. Skin: Palms without erythema. Port-A-Cath without erythema.   Lab Results:  Lab Results  Component Value Date   WBC 7.0 04/14/2021   HGB 11.6 (L) 04/14/2021   HCT 36.1 04/14/2021   MCV 93.8 04/14/2021   PLT 251 04/14/2021   NEUTROABS 3.4 04/14/2021    Imaging:  No results found.  Medications: I have reviewed the patient's current medications.  Assessment/Plan: Metastatic pancreas cancer Right upper quadrant ultrasound 06/17/2020-multiple hypoechoic liver lesions CT abdomen/pelvis 06/17/2020- pancreas tail mass, multiple liver metastases, enlarged periaortic nodes, right ovary metastasis?,  Nodularity at the left paracolic gutter and posterior/inferior stomach suggesting peritoneal tumor spread CT chest 06/17/2020- scattered small pulmonary nodules consistent with metastatic disease Ultrasound-guided biopsy of a left liver mass 06/18/2020- adenocarcinoma, morphology compatible with metastatic pancreatic adenocarcinoma Cycle 1 FOLFOX 07/01/2020 Cycle 2 FOLFOX 07/16/2020 Cycle 3 FOLFOX 07/31/2020 Cycle 4 FOLFIRINOX 08/13/2020 Cycle 5 FOLFIRINOX 09/03/2020, Udenyca CTs 09/13/2020-unchanged nodule medial left lower lobe; stable  nodal and likely peritoneal disease in the abdomen/pelvis; unchanged pancreatic tail mass; multifocal hepatic metastases, mildly progressive Cycle 6 FOLFIRINOX 09/16/2020, Oxaliplatin held Cycle 7 FOLFIRINOX 09/30/2020 Cycle 8 FOLFIRINOX 10/14/2020, oxaliplatin held Cycle 9 FOLFIRINOX 10/28/2020, oxaliplatin held Cycle 10 FOLFIRINOX 11/11/2020, oxaliplatin held Cycle 11 FOLFIRINOX 11/25/2020, oxaliplatin held CTs 12/06/2020-primary pancreatic tail tumor decreased in size.  Widespread liver metastases all decreased.  Previously visualized peritoneal nodule adjacent to the distal stomach resolved.  Left lower lobe pulmonary nodule stable.  No new or progressive metastatic disease. Cycle 12 FOLFIRI 12/09/2020 Cycle 13 FOLFIRI 12/23/2020 Cycle 14 FOLFIRI 01/06/2021 Cycle 15 FOLFIRI 01/20/2021 Cycle 16 FOLFIRI 02/03/2021 Cycle 17 FOLFIRI 02/17/2021 CTs 02/27/2021-slight decrease in size of pancreas tail mass and hypodense liver lesions, unchanged left lower lobe nodule, enlargement of a single peritoneal nodule anterior to the distal descending colon Cycle 18 FOLFIRI 03/04/2021 Cycle 19 FOLFIRI 03/17/2021 Cycle 20 FOLFIRI 03/31/2021 Cycle 21 FOLFIRI 04/14/2021 2.  Pain secondary to #1-improved 3.  Weight loss-improved 4.  Family history of pancreas cancer 5.  Neutropenia secondary chemotherapy-G-CSF added with cycle 5 FOLFIRINOX 6.  Oxaliplatin neuropathy-oxaliplatin held 09/16/2020, 10/14/2020, 10/28/2020, 11/11/2020, 11/25/2020  Disposition: Kirsten [name] appears stable.  She is on active treatment with FOLFIRI.  Plan to proceed with treatment today as scheduled.  We discussed the rising CA 19-9 tumor marker.  She will have restaging CTs prior to the next office visit.  CBC and chemistry panel reviewed.  Labs adequate to proceed with FOLFIRI today as scheduled.  She will return for follow-up in 2 weeks.  She will contact the office in the interim with any problems.  Plan reviewed with Dr. Benay Spice.  Ned Card ANP/GNP-BC   04/14/2021  8:53 AM

## 2021-04-15 LAB — CANCER ANTIGEN 19-9: CA 19-9: 9822 U/mL — ABNORMAL HIGH (ref 0–35)

## 2021-04-16 ENCOUNTER — Other Ambulatory Visit: Payer: Self-pay

## 2021-04-16 ENCOUNTER — Inpatient Hospital Stay: Payer: Medicaid Other

## 2021-04-16 VITALS — BP 154/80 | HR 87 | Temp 98.3°F | Resp 18

## 2021-04-16 DIAGNOSIS — Z5111 Encounter for antineoplastic chemotherapy: Secondary | ICD-10-CM | POA: Diagnosis not present

## 2021-04-16 DIAGNOSIS — C252 Malignant neoplasm of tail of pancreas: Secondary | ICD-10-CM

## 2021-04-16 MED ORDER — SODIUM CHLORIDE 0.9% FLUSH
10.0000 mL | INTRAVENOUS | Status: DC | PRN
  Administered 2021-04-16: 10 mL

## 2021-04-16 MED ORDER — HEPARIN SOD (PORK) LOCK FLUSH 100 UNIT/ML IV SOLN
500.0000 [IU] | Freq: Once | INTRAVENOUS | Status: AC | PRN
  Administered 2021-04-16: 500 [IU]

## 2021-04-16 MED ORDER — PEGFILGRASTIM-BMEZ 6 MG/0.6ML ~~LOC~~ SOSY
6.0000 mg | PREFILLED_SYRINGE | Freq: Once | SUBCUTANEOUS | Status: AC
  Administered 2021-04-16: 6 mg via SUBCUTANEOUS
  Filled 2021-04-16: qty 0.6

## 2021-04-16 NOTE — Patient Instructions (Signed)

## 2021-04-24 ENCOUNTER — Other Ambulatory Visit: Payer: Self-pay

## 2021-04-24 ENCOUNTER — Encounter (HOSPITAL_BASED_OUTPATIENT_CLINIC_OR_DEPARTMENT_OTHER): Payer: Self-pay

## 2021-04-24 ENCOUNTER — Ambulatory Visit (HOSPITAL_BASED_OUTPATIENT_CLINIC_OR_DEPARTMENT_OTHER)
Admission: RE | Admit: 2021-04-24 | Discharge: 2021-04-24 | Disposition: A | Discharge status: Home / Self Care | Payer: Medicaid Other | Source: Ambulatory Visit | Attending: Nurse Practitioner | Admitting: Nurse Practitioner

## 2021-04-24 DIAGNOSIS — C252 Malignant neoplasm of tail of pancreas: Secondary | ICD-10-CM | POA: Diagnosis present

## 2021-04-24 DIAGNOSIS — C787 Secondary malignant neoplasm of liver and intrahepatic bile duct: Secondary | ICD-10-CM | POA: Diagnosis present

## 2021-04-24 MED ORDER — HEPARIN SOD (PORK) LOCK FLUSH 100 UNIT/ML IV SOLN
500.0000 [IU] | Freq: Once | INTRAVENOUS | Status: AC
  Administered 2021-04-24: 500 [IU] via INTRAVENOUS

## 2021-04-24 MED ORDER — IOHEXOL 300 MG/ML  SOLN
85.0000 mL | Freq: Once | INTRAMUSCULAR | Status: AC | PRN
  Administered 2021-04-24: 85 mL via INTRAVENOUS

## 2021-04-26 ENCOUNTER — Other Ambulatory Visit: Payer: Self-pay | Admitting: Oncology

## 2021-04-28 ENCOUNTER — Other Ambulatory Visit: Payer: Self-pay

## 2021-04-28 ENCOUNTER — Inpatient Hospital Stay: Payer: Medicaid Other

## 2021-04-28 ENCOUNTER — Encounter: Payer: Self-pay | Admitting: Nurse Practitioner

## 2021-04-28 ENCOUNTER — Inpatient Hospital Stay (HOSPITAL_BASED_OUTPATIENT_CLINIC_OR_DEPARTMENT_OTHER): Payer: Medicaid Other | Admitting: Nurse Practitioner

## 2021-04-28 VITALS — BP 151/82 | HR 96 | Temp 98.1°F | Resp 20 | Ht 63.0 in | Wt 132.0 lb

## 2021-04-28 DIAGNOSIS — Z95828 Presence of other vascular implants and grafts: Secondary | ICD-10-CM | POA: Diagnosis not present

## 2021-04-28 DIAGNOSIS — C252 Malignant neoplasm of tail of pancreas: Secondary | ICD-10-CM

## 2021-04-28 DIAGNOSIS — C787 Secondary malignant neoplasm of liver and intrahepatic bile duct: Secondary | ICD-10-CM | POA: Diagnosis not present

## 2021-04-28 DIAGNOSIS — Z5111 Encounter for antineoplastic chemotherapy: Secondary | ICD-10-CM | POA: Diagnosis not present

## 2021-04-28 LAB — CBC WITH DIFFERENTIAL (CANCER CENTER ONLY)
Abs Immature Granulocytes: 0.03 10*3/uL (ref 0.00–0.07)
Basophils Absolute: 0 10*3/uL (ref 0.0–0.1)
Basophils Relative: 0 %
Eosinophils Absolute: 0.2 10*3/uL (ref 0.0–0.5)
Eosinophils Relative: 3 %
HCT: 37 % (ref 36.0–46.0)
Hemoglobin: 11.6 g/dL — ABNORMAL LOW (ref 12.0–15.0)
Immature Granulocytes: 0 %
Lymphocytes Relative: 24 %
Lymphs Abs: 2.2 10*3/uL (ref 0.7–4.0)
MCH: 29.4 pg (ref 26.0–34.0)
MCHC: 31.4 g/dL (ref 30.0–36.0)
MCV: 93.9 fL (ref 80.0–100.0)
Monocytes Absolute: 1.1 10*3/uL — ABNORMAL HIGH (ref 0.1–1.0)
Monocytes Relative: 12 %
Neutro Abs: 5.4 10*3/uL (ref 1.7–7.7)
Neutrophils Relative %: 61 %
Platelet Count: 237 10*3/uL (ref 150–400)
RBC: 3.94 MIL/uL (ref 3.87–5.11)
RDW: 14.3 % (ref 11.5–15.5)
WBC Count: 8.9 10*3/uL (ref 4.0–10.5)
nRBC: 0 % (ref 0.0–0.2)

## 2021-04-28 LAB — CMP (CANCER CENTER ONLY)
ALT: 25 U/L (ref 0–44)
AST: 19 U/L (ref 15–41)
Albumin: 3.8 g/dL (ref 3.5–5.0)
Alkaline Phosphatase: 280 U/L — ABNORMAL HIGH (ref 38–126)
Anion gap: 8 (ref 5–15)
BUN: 5 mg/dL — ABNORMAL LOW (ref 8–23)
CO2: 27 mmol/L (ref 22–32)
Calcium: 9.7 mg/dL (ref 8.9–10.3)
Chloride: 105 mmol/L (ref 98–111)
Creatinine: 0.81 mg/dL (ref 0.44–1.00)
GFR, Estimated: >60 mL/min (ref >60)
Glucose, Bld: 146 mg/dL — ABNORMAL HIGH (ref 70–99)
Potassium: 3.8 mmol/L (ref 3.5–5.1)
Sodium: 140 mmol/L (ref 135–145)
Total Bilirubin: 0.4 mg/dL (ref 0.3–1.2)
Total Protein: 6.7 g/dL (ref 6.5–8.1)

## 2021-04-28 MED ORDER — SODIUM CHLORIDE 0.9% FLUSH
10.0000 mL | INTRAVENOUS | Status: DC | PRN
  Administered 2021-04-28: 10 mL via INTRAVENOUS

## 2021-04-28 MED ORDER — HEPARIN SOD (PORK) LOCK FLUSH 100 UNIT/ML IV SOLN
500.0000 [IU] | Freq: Once | INTRAVENOUS | Status: AC
  Administered 2021-04-28: 500 [IU] via INTRAVENOUS

## 2021-04-28 NOTE — Progress Notes (Signed)
[place] OFFICE PROGRESS NOTE   Diagnosis: Pancreas cancer  INTERVAL HISTORY:   Kirsten [name] returns as scheduled.  She completed another cycle of FOLFIRI 04/14/2021.  She denies nausea/vomiting.  She developed a "burning" abdominal discomfort on the day of pump discontinuation.  She took a nausea pill and the burning resolved.  No mouth sores.  No diarrhea.  She had stomach cramps and vomited 1 time after the CT scan.  Neuropathy symptoms are stable as compared to 2 weeks ago, improved overall. Objective:  Vital signs in last 24 hours:  Blood pressure (!) 151/82, pulse 96, temperature 98.1 F (36.7 C), temperature source Oral, resp. rate 20, height 5\' 3"  (1.6 m), weight 132 lb (59.9 kg), SpO2 100 %.    HEENT: No thrush or ulcers. Resp: Lungs clear bilaterally. Cardio: Regular rate and rhythm. GI: Abdomen soft and nontender.  No hepatomegaly. Vascular: No leg edema. Skin: Palms without erythema. Port-A-Cath without erythema.   Lab Results:  Lab Results  Component Value Date   WBC 8.9 04/28/2021   HGB 11.6 (L) 04/28/2021   HCT 37.0 04/28/2021   MCV 93.9 04/28/2021   PLT 237 04/28/2021   NEUTROABS 5.4 04/28/2021    Imaging:  No results found.  Medications: I have reviewed the patient's current medications.  Assessment/Plan: Metastatic pancreas cancer Right upper quadrant ultrasound 06/17/2020-multiple hypoechoic liver lesions CT abdomen/pelvis 06/17/2020- pancreas tail mass, multiple liver metastases, enlarged periaortic nodes, right ovary metastasis?,  Nodularity at the left paracolic gutter and posterior/inferior stomach suggesting peritoneal tumor spread CT chest 06/17/2020- scattered small pulmonary nodules consistent with metastatic disease Ultrasound-guided biopsy of a left liver mass 06/18/2020- adenocarcinoma, morphology compatible with metastatic pancreatic adenocarcinoma, MSS, tumor mutation burden 0 Cycle 1 FOLFOX 07/01/2020 Cycle 2 FOLFOX  07/16/2020 Cycle 3 FOLFOX 07/31/2020 Cycle 4 FOLFIRINOX 08/13/2020 Cycle 5 FOLFIRINOX 09/03/2020, Udenyca CTs 09/13/2020-unchanged nodule medial left lower lobe; stable nodal and likely peritoneal disease in the abdomen/pelvis; unchanged pancreatic tail mass; multifocal hepatic metastases, mildly progressive Cycle 6 FOLFIRINOX 09/16/2020, Oxaliplatin held Cycle 7 FOLFIRINOX 09/30/2020 Cycle 8 FOLFIRINOX 10/14/2020, oxaliplatin held Cycle 9 FOLFIRINOX 10/28/2020, oxaliplatin held Cycle 10 FOLFIRINOX 11/11/2020, oxaliplatin held Cycle 11 FOLFIRINOX 11/25/2020, oxaliplatin held CTs 12/06/2020-primary pancreatic tail tumor decreased in size.  Widespread liver metastases all decreased.  Previously visualized peritoneal nodule adjacent to the distal stomach resolved.  Left lower lobe pulmonary nodule stable.  No new or progressive metastatic disease. Cycle 12 FOLFIRI 12/09/2020 Cycle 13 FOLFIRI 12/23/2020 Cycle 14 FOLFIRI 01/06/2021 Cycle 15 FOLFIRI 01/20/2021 Cycle 16 FOLFIRI 02/03/2021 Cycle 17 FOLFIRI 02/17/2021 CTs 02/27/2021-slight decrease in size of pancreas tail mass and hypodense liver lesions, unchanged left lower lobe nodule, enlargement of a single peritoneal nodule anterior to the distal descending colon Cycle 18 FOLFIRI 03/04/2021 Cycle 19 FOLFIRI 03/17/2021 Cycle 20 FOLFIRI 03/31/2021 Cycle 21 FOLFIRI 04/14/2021 CTs 04/24/2021-unchanged nodule left lower lobe.  Numerous hypodense liver lesions, a few of which are clearly increased in size.  Unchanged mass pancreatic tail. 2.  Pain secondary to #1-improved 3.  Weight loss-improved 4.  Family history of pancreas cancer 5.  Neutropenia secondary chemotherapy-G-CSF added with cycle 5 FOLFIRINOX 6.  Oxaliplatin neuropathy-oxaliplatin held 09/16/2020, 10/14/2020, 10/28/2020, 11/11/2020, 11/25/2020  Disposition: Kirsten [name] appears stable.  She has completed 21 cycles of FOLFIRI.  Restaging CTs show enlargement of several liver lesions.  Dr. Benay Spice reviewed  the report/images with Kirsten [name] and her daughter at today's visit and recommends discontinuation of FOLFIRI.  We discussed Gemcitabine/Abraxane on a 2-week  schedule.  We reviewed potential toxicities including bone marrow toxicity, nausea, hair loss.  We discussed the neuropathy associated with Abraxane.  We discussed pneumonitis, fever, rash associated with Gemcitabine.  She agrees to proceed.  Printed information on both gemcitabine and Abraxane provided to her at today's visit.  She will return for cycle 1 in 1 week.  We will see her in follow-up in 3 weeks.  She will contact the office in the interim with any problems.  Patient seen with Dr. Benay Spice.   Ned Card ANP/GNP-BC   04/28/2021  9:05 AM This was a shared visit with Ned Card.  We reviewed the CT findings and images with Kirsten [name] and her daughter.  The CT is consistent with enlargement of several liver lesions and an abdominal nodule.  The CA 19-9 has been higher over the past month.  We discussed systemic treatment options.  I recommend discontinuing FOLFIRI.  We discussed resuming oxaliplatin based therapy, but she has persistent neuropathy symptoms.  I recommend a change to gemcitabine/Abraxane.  We reviewed potential toxicities associated with this regimen.  We discussed the potential for worsening of neuropathy.  She agrees to proceed.  A chemotherapy plan was entered today.  I was present for greater than 50% of today's visit.  I performed medical decision making.  [name], MD

## 2021-04-28 NOTE — Progress Notes (Signed)
DISCONTINUE ON PATHWAY REGIMEN - Pancreatic Adenocarcinoma     A cycle is every 14 days:     Oxaliplatin      Leucovorin      Irinotecan      Fluorouracil   **Always confirm dose/schedule in your pharmacy ordering system**  REASON: Disease Progression PRIOR TREATMENT: PANOS98: mFOLFIRINOX q14 Days Until Progression or Toxicity TREATMENT RESPONSE: Partial Response (PR)  START ON PATHWAY REGIMEN - Pancreatic Adenocarcinoma     A cycle is every 28 days:     Nab-paclitaxel (protein bound)      Gemcitabine   **Always confirm dose/schedule in your pharmacy ordering system**  Patient Characteristics: Metastatic Disease, Second Line, MSS/pMMR or MSI Unknown, Fluoropyrimidine-Based Therapy First Line Therapeutic Status: Metastatic Disease Line of Therapy: Second Line Microsatellite/Mismatch Repair Status: Unknown Intent of Therapy: Non-Curative / Palliative Intent, Discussed with Patient 

## 2021-04-29 LAB — CANCER ANTIGEN 19-9: CA 19-9: 12543 U/mL — ABNORMAL HIGH (ref 0–35)

## 2021-04-30 ENCOUNTER — Inpatient Hospital Stay: Payer: Medicaid Other

## 2021-05-04 ENCOUNTER — Other Ambulatory Visit: Payer: Self-pay | Admitting: Oncology

## 2021-05-05 ENCOUNTER — Other Ambulatory Visit: Payer: Self-pay

## 2021-05-05 ENCOUNTER — Inpatient Hospital Stay: Payer: Medicaid Other | Attending: Oncology

## 2021-05-05 ENCOUNTER — Inpatient Hospital Stay: Payer: Medicaid Other

## 2021-05-05 VITALS — BP 148/82 | HR 82 | Temp 98.6°F | Resp 18 | Wt 132.2 lb

## 2021-05-05 DIAGNOSIS — C252 Malignant neoplasm of tail of pancreas: Secondary | ICD-10-CM

## 2021-05-05 DIAGNOSIS — C787 Secondary malignant neoplasm of liver and intrahepatic bile duct: Secondary | ICD-10-CM | POA: Diagnosis not present

## 2021-05-05 DIAGNOSIS — Z5111 Encounter for antineoplastic chemotherapy: Secondary | ICD-10-CM | POA: Diagnosis not present

## 2021-05-05 DIAGNOSIS — C7800 Secondary malignant neoplasm of unspecified lung: Secondary | ICD-10-CM | POA: Insufficient documentation

## 2021-05-05 LAB — CBC WITH DIFFERENTIAL (CANCER CENTER ONLY)
Abs Immature Granulocytes: 0.02 10*3/uL (ref 0.00–0.07)
Basophils Absolute: 0.1 10*3/uL (ref 0.0–0.1)
Basophils Relative: 1 %
Eosinophils Absolute: 0.1 10*3/uL (ref 0.0–0.5)
Eosinophils Relative: 1 %
HCT: 39.6 % (ref 36.0–46.0)
Hemoglobin: 12.4 g/dL (ref 12.0–15.0)
Immature Granulocytes: 0 %
Lymphocytes Relative: 33 %
Lymphs Abs: 2.4 10*3/uL (ref 0.7–4.0)
MCH: 29.3 pg (ref 26.0–34.0)
MCHC: 31.3 g/dL (ref 30.0–36.0)
MCV: 93.6 fL (ref 80.0–100.0)
Monocytes Absolute: 0.9 10*3/uL (ref 0.1–1.0)
Monocytes Relative: 12 %
Neutro Abs: 3.9 10*3/uL (ref 1.7–7.7)
Neutrophils Relative %: 53 %
Platelet Count: 287 10*3/uL (ref 150–400)
RBC: 4.23 MIL/uL (ref 3.87–5.11)
RDW: 13.8 % (ref 11.5–15.5)
WBC Count: 7.4 10*3/uL (ref 4.0–10.5)
nRBC: 0 % (ref 0.0–0.2)

## 2021-05-05 LAB — CMP (CANCER CENTER ONLY)
ALT: 31 U/L (ref 0–44)
AST: 40 U/L (ref 15–41)
Albumin: 3.9 g/dL (ref 3.5–5.0)
Alkaline Phosphatase: 308 U/L — ABNORMAL HIGH (ref 38–126)
Anion gap: 8 (ref 5–15)
BUN: 9 mg/dL (ref 8–23)
CO2: 28 mmol/L (ref 22–32)
Calcium: 9.9 mg/dL (ref 8.9–10.3)
Chloride: 101 mmol/L (ref 98–111)
Creatinine: 0.88 mg/dL (ref 0.44–1.00)
GFR, Estimated: >60 mL/min (ref >60)
Glucose, Bld: 194 mg/dL — ABNORMAL HIGH (ref 70–99)
Potassium: 4.6 mmol/L (ref 3.5–5.1)
Sodium: 137 mmol/L (ref 135–145)
Total Bilirubin: 0.4 mg/dL (ref 0.3–1.2)
Total Protein: 7.2 g/dL (ref 6.5–8.1)

## 2021-05-05 MED ORDER — PACLITAXEL PROTEIN-BOUND CHEMO INJECTION 100 MG
100.0000 mg/m2 | Freq: Once | INTRAVENOUS | Status: AC
  Administered 2021-05-05: 175 mg via INTRAVENOUS
  Filled 2021-05-05: qty 35

## 2021-05-05 MED ORDER — SODIUM CHLORIDE 0.9% FLUSH
10.0000 mL | INTRAVENOUS | Status: DC | PRN
  Administered 2021-05-05: 10 mL

## 2021-05-05 MED ORDER — SODIUM CHLORIDE 0.9 % IV SOLN: Freq: Once | INTRAVENOUS | Status: AC

## 2021-05-05 MED ORDER — PROCHLORPERAZINE MALEATE 10 MG PO TABS
10.0000 mg | ORAL_TABLET | Freq: Once | ORAL | Status: AC
  Administered 2021-05-05: 10 mg via ORAL
  Filled 2021-05-05: qty 1

## 2021-05-05 MED ORDER — HEPARIN SOD (PORK) LOCK FLUSH 100 UNIT/ML IV SOLN
500.0000 [IU] | Freq: Once | INTRAVENOUS | Status: AC | PRN
  Administered 2021-05-05: 500 [IU]

## 2021-05-05 MED ORDER — SODIUM CHLORIDE 0.9 % IV SOLN
1000.0000 mg/m2 | Freq: Once | INTRAVENOUS | Status: AC
  Administered 2021-05-05: 1634 mg via INTRAVENOUS
  Filled 2021-05-05: qty 26.3

## 2021-05-05 NOTE — Patient Instructions (Signed)
[facility]  Discharge Instructions: Thank you for choosing [facility] to provide your oncology and hematology care.   If you have a lab appointment with the [facility], please go directly to the [facility] and check in at the registration area.   Wear comfortable clothing and clothing appropriate for easy access to any Portacath or PICC line.   We strive to give you quality time with your provider. You may need to reschedule your appointment if you arrive late (15 or more minutes).  Arriving late affects you and other patients whose appointments are after yours.  Also, if you miss three or more appointments without notifying the office, you may be dismissed from the clinic at the providers discretion.      For prescription refill requests, have your pharmacy contact our office and allow 72 hours for refills to be completed.    Today you received the following chemotherapy and/or immunotherapy agents: Paclitaxel Protein-Bound, Gemcitabine.   Nanoparticle Albumin-Bound Paclitaxel injection What is this medication? NANOPARTICLE ALBUMIN-BOUND PACLITAXEL (Na no PAHR ti kuhl  al BYOO muhn-bound  PAK li TAX el) is a chemotherapy drug. It targets fast dividing cells, like cancer cells, and causes these cells to die. This medicine is used to treat advanced breast cancer, lung cancer, and pancreatic cancer. This medicine may be used for other purposes; ask your health care provider or pharmacist if you have questions. COMMON BRAND NAME(S): Abraxane What should I tell my care team before I take this medication? They need to know if you have any of these conditions: kidney disease liver disease low blood counts, like low white cell, platelet, or red cell counts lung or breathing disease, like asthma tingling of the fingers or toes, or other nerve disorder an unusual or allergic reaction to paclitaxel, albumin, other chemotherapy, other medicines, foods,  dyes, or preservatives pregnant or trying to get pregnant breast-feeding How should I use this medication? This drug is given as an infusion into a vein. It is administered in a hospital or clinic by a specially trained health care professional. Talk to your pediatrician regarding the use of this medicine in children. Special care may be needed. Overdosage: If you think you have taken too much of this medicine contact a poison control center or emergency room at once. NOTE: This medicine is only for you. Do not share this medicine with others. What if I miss a dose? It is important not to miss your dose. Call your doctor or health care professional if you are unable to keep an appointment. What may interact with this medication? This medicine may interact with the following medications: antiviral medicines for hepatitis, HIV or AIDS certain antibiotics like erythromycin and clarithromycin certain medicines for fungal infections like ketoconazole and itraconazole certain medicines for seizures like carbamazepine, phenobarbital, phenytoin gemfibrozil nefazodone rifampin St. John's wort This list may not describe all possible interactions. Give your health care provider a list of all the medicines, herbs, non-prescription drugs, or dietary supplements you use. Also tell them if you smoke, drink alcohol, or use illegal drugs. Some items may interact with your medicine. What should I watch for while using this medication? Your condition will be monitored carefully while you are receiving this medicine. You will need important blood work done while you are taking this medicine. This medicine can cause serious allergic reactions. If you experience allergic reactions like skin rash, itching or hives, swelling of the face, lips, or tongue, tell  your doctor or health care professional right away. In some cases, you may be given additional medicines to help with side effects. Follow all directions for  their use. This drug may make you feel generally unwell. This is not uncommon, as chemotherapy can affect healthy cells as well as cancer cells. Report any side effects. Continue your course of treatment even though you feel ill unless your doctor tells you to stop. Call your doctor or health care professional for advice if you get a fever, chills or sore throat, or other symptoms of a cold or flu. Do not treat yourself. This drug decreases your body's ability to fight infections. Try to avoid being around people who are sick. This medicine may increase your risk to bruise or bleed. Call your doctor or health care professional if you notice any unusual bleeding. Be careful brushing and flossing your teeth or using a toothpick because you may get an infection or bleed more easily. If you have any dental work done, tell your dentist you are receiving this medicine. Avoid taking products that contain aspirin, acetaminophen, ibuprofen, naproxen, or ketoprofen unless instructed by your doctor. These medicines may hide a fever. Do not become pregnant while taking this medicine or for 6 months after stopping it. Women should inform their doctor if they wish to become pregnant or think they might be pregnant. Men should not father a child while taking this medicine or for 3 months after stopping it. There is a potential for serious side effects to an unborn child. Talk to your health care professional or pharmacist for more information. Do not breast-feed an infant while taking this medicine or for 2 weeks after stopping it. This medicine may interfere with the ability to get pregnant or to father a child. You should talk to your doctor or health care professional if you are concerned about your fertility. What side effects may I notice from receiving this medication? Side effects that you should report to your doctor or health care professional as soon as possible: allergic reactions like skin rash, itching or  hives, swelling of the face, lips, or tongue breathing problems changes in vision fast, irregular heartbeat low blood pressure mouth sores pain, tingling, numbness in the hands or feet signs of decreased platelets or bleeding - bruising, pinpoint red spots on the skin, black, tarry stools, blood in the urine signs of decreased red blood cells - unusually weak or tired, feeling faint or lightheaded, falls signs of infection - fever or chills, cough, sore throat, pain or difficulty passing urine signs and symptoms of liver injury like dark yellow or brown urine; general ill feeling or flu-like symptoms; light-colored stools; loss of appetite; nausea; right upper belly pain; unusually weak or tired; yellowing of the eyes or skin swelling of the ankles, feet, hands unusually slow heartbeat Side effects that usually do not require medical attention (report to your doctor or health care professional if they continue or are bothersome): diarrhea hair loss loss of appetite nausea, vomiting tiredness This list may not describe all possible side effects. Call your doctor for medical advice about side effects. You may report side effects to FDA at 1-800-FDA-1088. Where should I keep my medication? This drug is given in a hospital or clinic and will not be stored at home. NOTE: This sheet is a summary. It may not cover all possible information. If you have questions about this medicine, talk to your doctor, pharmacist, or health care provider.  2022 Elsevier/Gold Standard (2016-10-20  00:00:00)   Gemcitabine injection What is this medication? GEMCITABINE (jem SYE ta been) is a chemotherapy drug. This medicine is used to treat many types of cancer like breast cancer, lung cancer, pancreatic cancer, and ovarian cancer. This medicine may be used for other purposes; ask your health care provider or pharmacist if you have questions. COMMON BRAND NAME(S): Gemzar, Infugem What should I tell my care team  before I take this medication? They need to know if you have any of these conditions: blood disorders infection kidney disease liver disease lung or breathing disease, like asthma recent or ongoing radiation therapy an unusual or allergic reaction to gemcitabine, other chemotherapy, other medicines, foods, dyes, or preservatives pregnant or trying to get pregnant breast-feeding How should I use this medication? This drug is given as an infusion into a vein. It is administered in a hospital or clinic by a specially trained health care professional. Talk to your pediatrician regarding the use of this medicine in children. Special care may be needed. Overdosage: If you think you have taken too much of this medicine contact a poison control center or emergency room at once. NOTE: This medicine is only for you. Do not share this medicine with others. What if I miss a dose? It is important not to miss your dose. Call your doctor or health care professional if you are unable to keep an appointment. What may interact with this medication? medicines to increase blood counts like filgrastim, pegfilgrastim, sargramostim some other chemotherapy drugs like cisplatin vaccines Talk to your doctor or health care professional before taking any of these medicines: acetaminophen aspirin ibuprofen ketoprofen naproxen This list may not describe all possible interactions. Give your health care provider a list of all the medicines, herbs, non-prescription drugs, or dietary supplements you use. Also tell them if you smoke, drink alcohol, or use illegal drugs. Some items may interact with your medicine. What should I watch for while using this medication? Visit your doctor for checks on your progress. This drug may make you feel generally unwell. This is not uncommon, as chemotherapy can affect healthy cells as well as cancer cells. Report any side effects. Continue your course of treatment even though you feel  ill unless your doctor tells you to stop. In some cases, you may be given additional medicines to help with side effects. Follow all directions for their use. Call your doctor or health care professional for advice if you get a fever, chills or sore throat, or other symptoms of a cold or flu. Do not treat yourself. This drug decreases your body's ability to fight infections. Try to avoid being around people who are sick. This medicine may increase your risk to bruise or bleed. Call your doctor or health care professional if you notice any unusual bleeding. Be careful brushing and flossing your teeth or using a toothpick because you may get an infection or bleed more easily. If you have any dental work done, tell your dentist you are receiving this medicine. Avoid taking products that contain aspirin, acetaminophen, ibuprofen, naproxen, or ketoprofen unless instructed by your doctor. These medicines may hide a fever. Do not become pregnant while taking this medicine or for 6 months after stopping it. Women should inform their doctor if they wish to become pregnant or think they might be pregnant. Men should not father a child while taking this medicine and for 3 months after stopping it. There is a potential for serious side effects to an unborn child. Talk to  your health care professional or pharmacist for more information. Do not breast-feed an infant while taking this medicine or for at least 1 week after stopping it. Men should inform their doctors if they wish to father a child. This medicine may lower sperm counts. Talk with your doctor or health care professional if you are concerned about your fertility. What side effects may I notice from receiving this medication? Side effects that you should report to your doctor or health care professional as soon as possible: allergic reactions like skin rash, itching or hives, swelling of the face, lips, or tongue breathing problems pain, redness, or  irritation at site where injected signs and symptoms of a dangerous change in heartbeat or heart rhythm like chest pain; dizziness; fast or irregular heartbeat; palpitations; feeling faint or lightheaded, falls; breathing problems signs of decreased platelets or bleeding - bruising, pinpoint red spots on the skin, black, tarry stools, blood in the urine signs of decreased red blood cells - unusually weak or tired, feeling faint or lightheaded, falls signs of infection - fever or chills, cough, sore throat, pain or difficulty passing urine signs and symptoms of kidney injury like trouble passing urine or change in the amount of urine signs and symptoms of liver injury like dark yellow or brown urine; general ill feeling or flu-like symptoms; light-colored stools; loss of appetite; nausea; right upper belly pain; unusually weak or tired; yellowing of the eyes or skin swelling of ankles, feet, hands Side effects that usually do not require medical attention (report to your doctor or health care professional if they continue or are bothersome): constipation diarrhea hair loss loss of appetite nausea rash vomiting This list may not describe all possible side effects. Call your doctor for medical advice about side effects. You may report side effects to FDA at 1-800-FDA-1088. Where should I keep my medication? This drug is given in a hospital or clinic and will not be stored at home. NOTE: This sheet is a summary. It may not cover all possible information. If you have questions about this medicine, talk to your doctor, pharmacist, or health care provider.  2022 Elsevier/Gold Standard (2017-05-12 00:00:00)    To help prevent nausea and vomiting after your treatment, we encourage you to take your nausea medication as directed.  BELOW ARE SYMPTOMS THAT SHOULD BE REPORTED IMMEDIATELY: *FEVER GREATER THAN 100.4 F (38 C) OR HIGHER *CHILLS OR SWEATING *NAUSEA AND VOMITING THAT IS NOT CONTROLLED WITH  YOUR NAUSEA MEDICATION *UNUSUAL SHORTNESS OF BREATH *UNUSUAL BRUISING OR BLEEDING *URINARY PROBLEMS (pain or burning when urinating, or frequent urination) *BOWEL PROBLEMS (unusual diarrhea, constipation, pain near the anus) TENDERNESS IN MOUTH AND THROAT WITH OR WITHOUT PRESENCE OF ULCERS (sore throat, sores in mouth, or a toothache) UNUSUAL RASH, SWELLING OR PAIN  UNUSUAL VAGINAL DISCHARGE OR ITCHING   Items with * indicate a potential emergency and should be followed up as soon as possible or go to the Emergency Department if any problems should occur.  Please show the CHEMOTHERAPY ALERT CARD or IMMUNOTHERAPY ALERT CARD at check-in to the Emergency Department and triage nurse.  Should you have questions after your visit or need to cancel or reschedule your appointment, please contact [facility]  Dept: [phone]  and follow the prompts.  Office hours are 8:00 a.m. to 4:30 p.m. Monday - Friday. Please note that voicemails left after 4:00 p.m. may not be returned until the following business day.  We are closed weekends and major holidays.  You have access to a nurse at all times for urgent questions. Please call the main number to the clinic Dept: [phone] and follow the prompts.   For any non-urgent questions, you may also contact your provider using MyChart. We now offer e-Visits for anyone 43 and older to request care online for non-urgent symptoms. For details visit [email or web address].   Also download the MyChart app! Go to the app store, search "MyChart", open the app, select [facility], and log in with your MyChart username and password.  Due to Covid, a mask is required upon entering the hospital/clinic. If you do not have a mask, one will be given to you upon arrival. For doctor visits, patients may have 1 support person aged 28 or older with them. For treatment visits, patients cannot have anyone with them due to current Covid guidelines and our  immunocompromised population.

## 2021-05-05 NOTE — Progress Notes (Signed)
Patient presents for treatment. RN assessment completed along with the following: ? ?Labs/vitals reviewed - Yes, and within treatment parameters.   ?Weight within 10% of previous measurement - Yes ?Informed consent completed and reflects current therapy/intent - Yes, on date 05/05/21             ?Provider progress note reviewed - Patient not seen by provider today. Most recent note dated 04/28/21 reviewed. ?Treatment/Antibody/Supportive plan reviewed - Yes, and there are no adjustments needed for today's treatment. ?S&H and other orders reviewed - Yes, and there are no additional orders identified. ?Previous treatment date reviewed - Yes and this is patient's first treatment of this plan.  ? ?Patient to proceed with treatment.   ?

## 2021-05-05 NOTE — Patient Instructions (Signed)

## 2021-05-06 ENCOUNTER — Telehealth: Payer: Self-pay

## 2021-05-06 LAB — CANCER ANTIGEN 19-9: CA 19-9: 19985 U/mL — ABNORMAL HIGH (ref 0–35)

## 2021-05-06 NOTE — Telephone Encounter (Signed)
Called and spoke to patient for first time chemo follow-up.  Patient states she slept most of the day yesterday (05/05/21) but feels fine today.  Patient denied any nausea but has been taking her nausea meds as prescribed.  Patient states she has been able to eat and drink fluids without any issues.  Patient reminded to call office if she has any additional questions or concerns.  Patient verbalized understanding.  All questions were answered during the phone call. Patient's next scheduled appointment was confirmed.  ?

## 2021-05-17 ENCOUNTER — Other Ambulatory Visit: Payer: Self-pay | Admitting: Oncology

## 2021-05-19 ENCOUNTER — Inpatient Hospital Stay: Payer: Medicaid Other

## 2021-05-19 ENCOUNTER — Other Ambulatory Visit: Payer: Self-pay

## 2021-05-19 ENCOUNTER — Encounter: Payer: Self-pay | Admitting: Oncology

## 2021-05-19 ENCOUNTER — Inpatient Hospital Stay (HOSPITAL_BASED_OUTPATIENT_CLINIC_OR_DEPARTMENT_OTHER): Payer: Medicaid Other | Admitting: Oncology

## 2021-05-19 ENCOUNTER — Encounter: Payer: Self-pay | Admitting: *Deleted

## 2021-05-19 VITALS — BP 150/90 | HR 96 | Temp 97.8°F | Resp 18 | Ht 63.0 in | Wt 130.4 lb

## 2021-05-19 DIAGNOSIS — C252 Malignant neoplasm of tail of pancreas: Secondary | ICD-10-CM

## 2021-05-19 DIAGNOSIS — C787 Secondary malignant neoplasm of liver and intrahepatic bile duct: Secondary | ICD-10-CM

## 2021-05-19 DIAGNOSIS — Z5111 Encounter for antineoplastic chemotherapy: Secondary | ICD-10-CM | POA: Diagnosis not present

## 2021-05-19 LAB — CMP (CANCER CENTER ONLY)
ALT: 30 U/L (ref 0–44)
AST: 26 U/L (ref 15–41)
Albumin: 3.9 g/dL (ref 3.5–5.0)
Alkaline Phosphatase: 374 U/L — ABNORMAL HIGH (ref 38–126)
Anion gap: 8 (ref 5–15)
BUN: 8 mg/dL (ref 8–23)
CO2: 28 mmol/L (ref 22–32)
Calcium: 10.1 mg/dL (ref 8.9–10.3)
Chloride: 102 mmol/L (ref 98–111)
Creatinine: 0.71 mg/dL (ref 0.44–1.00)
GFR, Estimated: >60 mL/min (ref >60)
Glucose, Bld: 115 mg/dL — ABNORMAL HIGH (ref 70–99)
Potassium: 4 mmol/L (ref 3.5–5.1)
Sodium: 138 mmol/L (ref 135–145)
Total Bilirubin: 0.7 mg/dL (ref 0.3–1.2)
Total Protein: 7.4 g/dL (ref 6.5–8.1)

## 2021-05-19 LAB — CBC WITH DIFFERENTIAL (CANCER CENTER ONLY)
Abs Immature Granulocytes: 0.01 10*3/uL (ref 0.00–0.07)
Basophils Absolute: 0 10*3/uL (ref 0.0–0.1)
Basophils Relative: 1 %
Eosinophils Absolute: 0.3 10*3/uL (ref 0.0–0.5)
Eosinophils Relative: 5 %
HCT: 36.4 % (ref 36.0–46.0)
Hemoglobin: 11.7 g/dL — ABNORMAL LOW (ref 12.0–15.0)
Immature Granulocytes: 0 %
Lymphocytes Relative: 39 %
Lymphs Abs: 2.4 10*3/uL (ref 0.7–4.0)
MCH: 30 pg (ref 26.0–34.0)
MCHC: 32.1 g/dL (ref 30.0–36.0)
MCV: 93.3 fL (ref 80.0–100.0)
Monocytes Absolute: 0.7 10*3/uL (ref 0.1–1.0)
Monocytes Relative: 12 %
Neutro Abs: 2.7 10*3/uL (ref 1.7–7.7)
Neutrophils Relative %: 43 %
Platelet Count: 340 10*3/uL (ref 150–400)
RBC: 3.9 MIL/uL (ref 3.87–5.11)
RDW: 14 % (ref 11.5–15.5)
WBC Count: 6.2 10*3/uL (ref 4.0–10.5)
nRBC: 0 % (ref 0.0–0.2)

## 2021-05-19 MED ORDER — PACLITAXEL PROTEIN-BOUND CHEMO INJECTION 100 MG
100.0000 mg/m2 | Freq: Once | INTRAVENOUS | Status: AC
  Administered 2021-05-19: 175 mg via INTRAVENOUS
  Filled 2021-05-19: qty 35

## 2021-05-19 MED ORDER — SODIUM CHLORIDE 0.9 % IV SOLN
1000.0000 mg/m2 | Freq: Once | INTRAVENOUS | Status: AC
  Administered 2021-05-19: 1634 mg via INTRAVENOUS
  Filled 2021-05-19: qty 26.3

## 2021-05-19 MED ORDER — HEPARIN SOD (PORK) LOCK FLUSH 100 UNIT/ML IV SOLN
500.0000 [IU] | Freq: Once | INTRAVENOUS | Status: AC | PRN
  Administered 2021-05-19: 500 [IU]

## 2021-05-19 MED ORDER — PROCHLORPERAZINE MALEATE 10 MG PO TABS
10.0000 mg | ORAL_TABLET | Freq: Once | ORAL | Status: AC
  Administered 2021-05-19: 10 mg via ORAL
  Filled 2021-05-19: qty 1

## 2021-05-19 MED ORDER — SODIUM CHLORIDE 0.9% FLUSH
10.0000 mL | INTRAVENOUS | Status: DC | PRN
  Administered 2021-05-19: 10 mL

## 2021-05-19 MED ORDER — SODIUM CHLORIDE 0.9 % IV SOLN: Freq: Once | INTRAVENOUS | Status: AC

## 2021-05-19 NOTE — Progress Notes (Signed)
Patient seen by Dr. Sherrill today ? ?Vitals are within treatment parameters. ? ?Labs reviewed by Dr. Sherrill and are within treatment parameters. ? ?Per physician team, patient is ready for treatment and there are NO modifications to the treatment plan.  ?

## 2021-05-19 NOTE — Patient Instructions (Signed)
[facility]  ? Discharge Instructions: ?Thank you for choosing [facility] to provide your oncology and hematology care.  ? ?If you have a lab appointment with the [facility], please go directly to the [facility] and check in at the registration area. ?  ?Wear comfortable clothing and clothing appropriate for easy access to any Portacath or PICC line.  ? ?We strive to give you quality time with your provider. You may need to reschedule your appointment if you arrive late (15 or more minutes).  Arriving late affects you and other patients whose appointments are after yours.  Also, if you miss three or more appointments without notifying the office, you may be dismissed from the clinic at the provider?s discretion.    ?  ?For prescription refill requests, have your pharmacy contact our office and allow 72 hours for refills to be completed.   ? ?Today you received the following chemotherapy and/or immunotherapy agents Abraxane, Gemzar    ?  ?To help prevent nausea and vomiting after your treatment, we encourage you to take your nausea medication as directed. ? ?BELOW ARE SYMPTOMS THAT SHOULD BE REPORTED IMMEDIATELY: ?*FEVER GREATER THAN 100.4 F (38 ?C) OR HIGHER ?*CHILLS OR SWEATING ?*NAUSEA AND VOMITING THAT IS NOT CONTROLLED WITH YOUR NAUSEA MEDICATION ?*UNUSUAL SHORTNESS OF BREATH ?*UNUSUAL BRUISING OR BLEEDING ?*URINARY PROBLEMS (pain or burning when urinating, or frequent urination) ?*BOWEL PROBLEMS (unusual diarrhea, constipation, pain near the anus) ?TENDERNESS IN MOUTH AND THROAT WITH OR WITHOUT PRESENCE OF ULCERS (sore throat, sores in mouth, or a toothache) ?UNUSUAL RASH, SWELLING OR PAIN  ?UNUSUAL VAGINAL DISCHARGE OR ITCHING  ? ?Items with * indicate a potential emergency and should be followed up as soon as possible or go to the Emergency Department if any problems should occur. ? ?Please show the CHEMOTHERAPY ALERT CARD or IMMUNOTHERAPY ALERT CARD at  check-in to the Emergency Department and triage nurse. ? ?Should you have questions after your visit or need to cancel or reschedule your appointment, please contact [facility]  Dept: [phone]  and follow the prompts.  Office hours are 8:00 a.m. to 4:30 p.m. Monday - Friday. Please note that voicemails left after 4:00 p.m. may not be returned until the following business day.  We are closed weekends and major holidays. You have access to a nurse at all times for urgent questions. Please call the main number to the clinic Dept: [phone] and follow the prompts. ? ? ?For any non-urgent questions, you may also contact your provider using MyChart. We now offer e-Visits for anyone 85 and older to request care online for non-urgent symptoms. For details visit [email or web address]. ?  ?Also download the MyChart app! Go to the app store, search "MyChart", open the app, select [facility], and log in with your MyChart username and password. ? ?Due to Covid, a mask is required upon entering the hospital/clinic. If you do not have a mask, one will be given to you upon arrival. For doctor visits, patients may have 1 support person aged 9 or older with them. For treatment visits, patients cannot have anyone with them due to current Covid guidelines and our immunocompromised population.  ? ?Nanoparticle Albumin-Bound Paclitaxel injection ?What is this medication? ?NANOPARTICLE ALBUMIN-BOUND PACLITAXEL (Na no PAHR ti kuhl  al BYOO muhn-bound  PAK li TAX el) is a chemotherapy drug. It targets fast dividing cells, like cancer cells, and causes these cells to die. This medicine is used  to treat advanced breast cancer, lung cancer, and pancreatic cancer. ?This medicine may be used for other purposes; ask your health care provider or pharmacist if you have questions. ?COMMON BRAND NAME(S): Abraxane ?What should I tell my care team before I take this medication? ?They need to know if you have any  of these conditions: ?kidney disease ?liver disease ?low blood counts, like low white cell, platelet, or red cell counts ?lung or breathing disease, like asthma ?tingling of the fingers or toes, or other nerve disorder ?an unusual or allergic reaction to paclitaxel, albumin, other chemotherapy, other medicines, foods, dyes, or preservatives ?pregnant or trying to get pregnant ?breast-feeding ?How should I use this medication? ?This drug is given as an infusion into a vein. It is administered in a hospital or clinic by a specially trained health care professional. ?Talk to your pediatrician regarding the use of this medicine in children. Special care may be needed. ?Overdosage: If you think you have taken too much of this medicine contact a poison control center or emergency room at once. ?NOTE: This medicine is only for you. Do not share this medicine with others. ?What if I miss a dose? ?It is important not to miss your dose. Call your doctor or health care professional if you are unable to keep an appointment. ?What may interact with this medication? ?This medicine may interact with the following medications: ?antiviral medicines for hepatitis, HIV or AIDS ?certain antibiotics like erythromycin and clarithromycin ?certain medicines for fungal infections like ketoconazole and itraconazole ?certain medicines for seizures like carbamazepine, phenobarbital, phenytoin ?gemfibrozil ?nefazodone ?rifampin ?St. John's wort ?This list may not describe all possible interactions. Give your health care provider a list of all the medicines, herbs, non-prescription drugs, or dietary supplements you use. Also tell them if you smoke, drink alcohol, or use illegal drugs. Some items may interact with your medicine. ?What should I watch for while using this medication? ?Your condition will be monitored carefully while you are receiving this medicine. You will need important blood work done while you are taking this medicine. ?This  medicine can cause serious allergic reactions. If you experience allergic reactions like skin rash, itching or hives, swelling of the face, lips, or tongue, tell your doctor or health care professional right away. ?In some cases, you may be given additional medicines to help with side effects. Follow all directions for their use. ?This drug may make you feel generally unwell. This is not uncommon, as chemotherapy can affect healthy cells as well as cancer cells. Report any side effects. Continue your course of treatment even though you feel ill unless your doctor tells you to stop. ?Call your doctor or health care professional for advice if you get a fever, chills or sore throat, or other symptoms of a cold or flu. Do not treat yourself. This drug decreases your body's ability to fight infections. Try to avoid being around people who are sick. ?This medicine may increase your risk to bruise or bleed. Call your doctor or health care professional if you notice any unusual bleeding. ?Be careful brushing and flossing your teeth or using a toothpick because you may get an infection or bleed more easily. If you have any dental work done, tell your dentist you are receiving this medicine. ?Avoid taking products that contain aspirin, acetaminophen, ibuprofen, naproxen, or ketoprofen unless instructed by your doctor. These medicines may hide a fever. ?Do not become pregnant while taking this medicine or for 6 months after stopping it. Women should  inform their doctor if they wish to become pregnant or think they might be pregnant. Men should not father a child while taking this medicine or for 3 months after stopping it. There is a potential for serious side effects to an unborn child. Talk to your health care professional or pharmacist for more information. ?Do not breast-feed an infant while taking this medicine or for 2 weeks after stopping it. ?This medicine may interfere with the ability to get pregnant or to father a  child. You should talk to your doctor or health care professional if you are concerned about your fertility. ?What side effects may I notice from receiving this medication? ?Side effects that you should report t

## 2021-05-19 NOTE — Progress Notes (Signed)
Patient presents for treatment. RN assessment completed along with the following: ? ?Labs/vitals reviewed - Yes, and within treatment parameters.   ?Weight within 10% of previous measurement - Yes ?Informed consent completed and reflects current therapy/intent - Yes, on date 05/05/21             ?Provider progress note reviewed - Yes, today's provider note was reviewed. ?Treatment/Antibody/Supportive plan reviewed - Yes, and there are no adjustments needed for today's treatment. ?S&H and other orders reviewed - Yes, and there are no additional orders identified. ?Previous treatment date reviewed - Yes, and the appropriate amount of time has elapsed between treatments. ?Clinic Hand Off Received from - Cristy Friedlander, RN ? ?Patient to proceed with treatment.  ? ?

## 2021-05-19 NOTE — Progress Notes (Signed)
?[facility] ?OFFICE PROGRESS NOTE ? ? ?Diagnosis: Pancreas cancer ? ?INTERVAL HISTORY:  ? ?Kirsten [name] completed cycle 1 gemcitabine/Abraxane 05/05/2021.  No rash or fever.  No change in neuropathy symptoms.  She continues to have numbness in the extremities.  Her appetite has diminished. ? ?Objective: ? ?Vital signs in last 24 hours: ? ?Blood pressure (!) 150/90, pulse 96, temperature 97.8 ?F (36.6 ?C), temperature source Oral, resp. rate 18, height '5\' 3"'$  (1.6 m), weight 130 lb 6.4 oz (59.1 kg), SpO2 100 %. ?  ? ?HEENT: No thrush or ulcers ?Resp: Lungs clear bilaterally ?Cardio: Regular rate and rhythm ?GI: No hepatosplenomegaly ?Vascular: No leg edema  ? ?Portacath/PICC-without erythema ? ?Lab Results: ? ?Lab Results  ?Component Value Date  ? WBC 6.2 05/19/2021  ? HGB 11.7 (L) 05/19/2021  ? HCT 36.4 05/19/2021  ? MCV 93.3 05/19/2021  ? PLT 340 05/19/2021  ? NEUTROABS 2.7 05/19/2021  ? ? ?CMP  ?Lab Results  ?Component Value Date  ? NA 138 05/19/2021  ? K 4.0 05/19/2021  ? CL 102 05/19/2021  ? CO2 28 05/19/2021  ? GLUCOSE 115 (H) 05/19/2021  ? BUN 8 05/19/2021  ? CREATININE 0.71 05/19/2021  ? CALCIUM 10.1 05/19/2021  ? PROT 7.4 05/19/2021  ? ALBUMIN 3.9 05/19/2021  ? AST 26 05/19/2021  ? ALT 30 05/19/2021  ? ALKPHOS 374 (H) 05/19/2021  ? BILITOT 0.7 05/19/2021  ? GFRNONAA >60 05/19/2021  ? ? ?Lab Results  ?Component Value Date  ? [id number] 19,985 (H) 05/05/2021  ? ? ?Medications: I have reviewed the patient's current medications. ? ? ?Assessment/Plan: ?Metastatic pancreas cancer ?Right upper quadrant ultrasound 06/17/2020-multiple hypoechoic liver lesions ?CT abdomen/pelvis 06/17/2020- pancreas tail mass, multiple liver metastases, enlarged periaortic nodes, right ovary metastasis?,  Nodularity at the left paracolic gutter and posterior/inferior stomach suggesting peritoneal tumor spread ?CT chest 06/17/2020- scattered small pulmonary nodules consistent with metastatic disease ?Ultrasound-guided biopsy of a  left liver mass 06/18/2020- adenocarcinoma, morphology compatible with metastatic pancreatic adenocarcinoma, MSS, tumor mutation burden 0 ?Cycle 1 FOLFOX 07/01/2020 ?Cycle 2 FOLFOX 07/16/2020 ?Cycle 3 FOLFOX 07/31/2020 ?Cycle 4 FOLFIRINOX 08/13/2020 ?Cycle 5 FOLFIRINOX 09/03/2020, Udenyca ?CTs 09/13/2020-unchanged nodule medial left lower lobe; stable nodal and likely peritoneal disease in the abdomen/pelvis; unchanged pancreatic tail mass; multifocal hepatic metastases, mildly progressive ?Cycle 6 FOLFIRINOX 09/16/2020, Oxaliplatin held ?Cycle 7 FOLFIRINOX 09/30/2020 ?Cycle 8 FOLFIRINOX 10/14/2020, oxaliplatin held ?Cycle 9 FOLFIRINOX 10/28/2020, oxaliplatin held ?Cycle 10 FOLFIRINOX 11/11/2020, oxaliplatin held ?Cycle 11 FOLFIRINOX 11/25/2020, oxaliplatin held ?CTs 12/06/2020-primary pancreatic tail tumor decreased in size.  Widespread liver metastases all decreased.  Previously visualized peritoneal nodule adjacent to the distal stomach resolved.  Left lower lobe pulmonary nodule stable.  No new or progressive metastatic disease. ?Cycle 12 FOLFIRI 12/09/2020 ?Cycle 13 FOLFIRI 12/23/2020 ?Cycle 14 FOLFIRI 01/06/2021 ?Cycle 15 FOLFIRI 01/20/2021 ?Cycle 16 FOLFIRI 02/03/2021 ?Cycle 17 FOLFIRI 02/17/2021 ?CTs 02/27/2021-slight decrease in size of pancreas tail mass and hypodense liver lesions, unchanged left lower lobe nodule, enlargement of a single peritoneal nodule anterior to the distal descending colon ?Cycle 18 FOLFIRI 03/04/2021 ?Cycle 19 FOLFIRI 03/17/2021 ?Cycle 20 FOLFIRI 03/31/2021 ?Cycle 21 FOLFIRI 04/14/2021 ?CTs 04/24/2021-unchanged nodule left lower lobe.  Numerous hypodense liver lesions, a few of which are clearly increased in size.  Unchanged mass pancreatic tail. ?Cycle 1 gemcitabine/Abraxane 05/05/2021 ?Cycle 2 gemcitabine/Abraxane 05/19/2021 ?2.  Pain secondary to #1-improved ?3.  Weight loss-improved ?4.  Family history of pancreas cancer ?5.  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 ? ? ?Disposition: ?Kirsten [name] appears stable.  She tolerated the first cycle of gemcitabine/Abraxane well.  She will complete cycle 2 today.  She will return for an office visit and chemotherapy in 2 weeks. ? ?[name], MD ? ?05/19/2021  ?8:56 AM ? ? ?

## 2021-05-20 LAB — CANCER ANTIGEN 19-9: CA 19-9: 25042 U/mL — ABNORMAL HIGH (ref 0–35)

## 2021-05-31 ENCOUNTER — Other Ambulatory Visit: Payer: Self-pay | Admitting: Oncology

## 2021-06-02 ENCOUNTER — Inpatient Hospital Stay: Payer: Medicaid Other

## 2021-06-02 ENCOUNTER — Inpatient Hospital Stay (HOSPITAL_BASED_OUTPATIENT_CLINIC_OR_DEPARTMENT_OTHER): Payer: Medicaid Other | Admitting: Oncology

## 2021-06-02 ENCOUNTER — Encounter: Payer: Self-pay | Admitting: *Deleted

## 2021-06-02 ENCOUNTER — Inpatient Hospital Stay: Payer: Medicaid Other | Attending: Oncology

## 2021-06-02 VITALS — BP 164/90 | HR 96 | Temp 97.8°F | Resp 18 | Ht 63.0 in | Wt 130.6 lb

## 2021-06-02 DIAGNOSIS — C787 Secondary malignant neoplasm of liver and intrahepatic bile duct: Secondary | ICD-10-CM | POA: Diagnosis not present

## 2021-06-02 DIAGNOSIS — D701 Agranulocytosis secondary to cancer chemotherapy: Secondary | ICD-10-CM | POA: Diagnosis not present

## 2021-06-02 DIAGNOSIS — C252 Malignant neoplasm of tail of pancreas: Secondary | ICD-10-CM

## 2021-06-02 DIAGNOSIS — Z5111 Encounter for antineoplastic chemotherapy: Secondary | ICD-10-CM | POA: Insufficient documentation

## 2021-06-02 LAB — CBC WITH DIFFERENTIAL (CANCER CENTER ONLY)
Abs Immature Granulocytes: 0.01 10*3/uL (ref 0.00–0.07)
Basophils Absolute: 0 10*3/uL (ref 0.0–0.1)
Basophils Relative: 0 %
Eosinophils Absolute: 0.3 10*3/uL (ref 0.0–0.5)
Eosinophils Relative: 4 %
HCT: 34.8 % — ABNORMAL LOW (ref 36.0–46.0)
Hemoglobin: 11.1 g/dL — ABNORMAL LOW (ref 12.0–15.0)
Immature Granulocytes: 0 %
Lymphocytes Relative: 32 %
Lymphs Abs: 2.1 10*3/uL (ref 0.7–4.0)
MCH: 29.5 pg (ref 26.0–34.0)
MCHC: 31.9 g/dL (ref 30.0–36.0)
MCV: 92.6 fL (ref 80.0–100.0)
Monocytes Absolute: 0.7 10*3/uL (ref 0.1–1.0)
Monocytes Relative: 11 %
Neutro Abs: 3.4 10*3/uL (ref 1.7–7.7)
Neutrophils Relative %: 53 %
Platelet Count: 239 10*3/uL (ref 150–400)
RBC: 3.76 MIL/uL — ABNORMAL LOW (ref 3.87–5.11)
RDW: 13.5 % (ref 11.5–15.5)
WBC Count: 6.6 10*3/uL (ref 4.0–10.5)
nRBC: 0 % (ref 0.0–0.2)

## 2021-06-02 LAB — CMP (CANCER CENTER ONLY)
ALT: 23 U/L (ref 0–44)
AST: 27 U/L (ref 15–41)
Albumin: 3.7 g/dL (ref 3.5–5.0)
Alkaline Phosphatase: 385 U/L — ABNORMAL HIGH (ref 38–126)
Anion gap: 8 (ref 5–15)
BUN: 8 mg/dL (ref 8–23)
CO2: 27 mmol/L (ref 22–32)
Calcium: 10 mg/dL (ref 8.9–10.3)
Chloride: 104 mmol/L (ref 98–111)
Creatinine: 0.74 mg/dL (ref 0.44–1.00)
GFR, Estimated: >60 mL/min (ref >60)
Glucose, Bld: 176 mg/dL — ABNORMAL HIGH (ref 70–99)
Potassium: 3.7 mmol/L (ref 3.5–5.1)
Sodium: 139 mmol/L (ref 135–145)
Total Bilirubin: 0.4 mg/dL (ref 0.3–1.2)
Total Protein: 7.3 g/dL (ref 6.5–8.1)

## 2021-06-02 MED ORDER — PACLITAXEL PROTEIN-BOUND CHEMO INJECTION 100 MG
100.0000 mg/m2 | Freq: Once | INTRAVENOUS | Status: AC
  Administered 2021-06-02: 175 mg via INTRAVENOUS
  Filled 2021-06-02: qty 35

## 2021-06-02 MED ORDER — SODIUM CHLORIDE 0.9% FLUSH
10.0000 mL | INTRAVENOUS | Status: DC | PRN
  Administered 2021-06-02: 10 mL

## 2021-06-02 MED ORDER — PROCHLORPERAZINE MALEATE 10 MG PO TABS
10.0000 mg | ORAL_TABLET | Freq: Once | ORAL | Status: AC
  Administered 2021-06-02: 10 mg via ORAL
  Filled 2021-06-02: qty 1

## 2021-06-02 MED ORDER — HEPARIN SOD (PORK) LOCK FLUSH 100 UNIT/ML IV SOLN
500.0000 [IU] | Freq: Once | INTRAVENOUS | Status: AC | PRN
  Administered 2021-06-02: 500 [IU]

## 2021-06-02 MED ORDER — SODIUM CHLORIDE 0.9 % IV SOLN: Freq: Once | INTRAVENOUS | Status: AC

## 2021-06-02 MED ORDER — SODIUM CHLORIDE 0.9 % IV SOLN
1000.0000 mg/m2 | Freq: Once | INTRAVENOUS | Status: AC
  Administered 2021-06-02: 1634 mg via INTRAVENOUS
  Filled 2021-06-02: qty 42.98

## 2021-06-02 NOTE — Progress Notes (Signed)
?[facility] ?OFFICE PROGRESS NOTE ? ? ?Diagnosis: Pancreas cancer ? ?INTERVAL HISTORY:  ? ?Ms. Scripter completed another treatment with gemcitabine/Abraxane on 05/19/2021.  No nausea, fever, or rash.  No change in neuropathy symptoms.  She reports increased malaise.  No pain. ? ?Objective: ? ?Vital signs in last 24 hours: ? ?Blood pressure (!) 164/90, pulse (!) 105, temperature 97.8 ?F (36.6 ?C), temperature source Oral, resp. rate 18, height '5\' 3"'$  (1.6 m), weight 130 lb 9.6 oz (59.2 kg), SpO2 100 %. ?  ? ?HEENT: No thrush or ulcers ?Resp: Lungs clear bilaterally ?Cardio: Regular rate and rhythm ?GI: No hepatosplenomegaly, no mass, nontender ?Vascular: No leg edema  ?Skin: Areas of superficial desquamation over the back ? ?Portacath/PICC-without erythema ? ?Lab Results: ? ?Lab Results  ?Component Value Date  ? WBC 6.6 06/02/2021  ? HGB 11.1 (L) 06/02/2021  ? HCT 34.8 (L) 06/02/2021  ? MCV 92.6 06/02/2021  ? PLT 239 06/02/2021  ? NEUTROABS 3.4 06/02/2021  ? ? ?CMP  ?Lab Results  ?Component Value Date  ? NA 138 05/19/2021  ? K 4.0 05/19/2021  ? CL 102 05/19/2021  ? CO2 28 05/19/2021  ? GLUCOSE 115 (H) 05/19/2021  ? BUN 8 05/19/2021  ? CREATININE 0.71 05/19/2021  ? CALCIUM 10.1 05/19/2021  ? PROT 7.4 05/19/2021  ? ALBUMIN 3.9 05/19/2021  ? AST 26 05/19/2021  ? ALT 30 05/19/2021  ? ALKPHOS 374 (H) 05/19/2021  ? BILITOT 0.7 05/19/2021  ? GFRNONAA >60 05/19/2021  ? ? ?Lab Results  ?Component Value Date  ? [id number] 25,042 (H) 05/19/2021  ? ?Medications: I have reviewed the patient's current medications. ? ? ?Assessment/Plan: ?Metastatic pancreas cancer ?Right upper quadrant ultrasound 06/17/2020-multiple hypoechoic liver lesions ?CT abdomen/pelvis 06/17/2020- pancreas tail mass, multiple liver metastases, enlarged periaortic nodes, right ovary metastasis?,  Nodularity at the left paracolic gutter and posterior/inferior stomach suggesting peritoneal tumor spread ?CT chest 06/17/2020- scattered small pulmonary  nodules consistent with metastatic disease ?Ultrasound-guided biopsy of a left liver mass 06/18/2020- adenocarcinoma, morphology compatible with metastatic pancreatic adenocarcinoma, MSS, tumor mutation burden 0 ?Cycle 1 FOLFOX 07/01/2020 ?Cycle 2 FOLFOX 07/16/2020 ?Cycle 3 FOLFOX 07/31/2020 ?Cycle 4 FOLFIRINOX 08/13/2020 ?Cycle 5 FOLFIRINOX 09/03/2020, Udenyca ?CTs 09/13/2020-unchanged nodule medial left lower lobe; stable nodal and likely peritoneal disease in the abdomen/pelvis; unchanged pancreatic tail mass; multifocal hepatic metastases, mildly progressive ?Cycle 6 FOLFIRINOX 09/16/2020, Oxaliplatin held ?Cycle 7 FOLFIRINOX 09/30/2020 ?Cycle 8 FOLFIRINOX 10/14/2020, oxaliplatin held ?Cycle 9 FOLFIRINOX 10/28/2020, oxaliplatin held ?Cycle 10 FOLFIRINOX 11/11/2020, oxaliplatin held ?Cycle 11 FOLFIRINOX 11/25/2020, oxaliplatin held ?CTs 12/06/2020-primary pancreatic tail tumor decreased in size.  Widespread liver metastases all decreased.  Previously visualized peritoneal nodule adjacent to the distal stomach resolved.  Left lower lobe pulmonary nodule stable.  No new or progressive metastatic disease. ?Cycle 12 FOLFIRI 12/09/2020 ?Cycle 13 FOLFIRI 12/23/2020 ?Cycle 14 FOLFIRI 01/06/2021 ?Cycle 15 FOLFIRI 01/20/2021 ?Cycle 16 FOLFIRI 02/03/2021 ?Cycle 17 FOLFIRI 02/17/2021 ?CTs 02/27/2021-slight decrease in size of pancreas tail mass and hypodense liver lesions, unchanged left lower lobe nodule, enlargement of a single peritoneal nodule anterior to the distal descending colon ?Cycle 18 FOLFIRI 03/04/2021 ?Cycle 19 FOLFIRI 03/17/2021 ?Cycle 20 FOLFIRI 03/31/2021 ?Cycle 21 FOLFIRI 04/14/2021 ?CTs 04/24/2021-unchanged nodule left lower lobe.  Numerous hypodense liver lesions, a few of which are clearly increased in size.  Unchanged mass pancreatic tail. ?Cycle 1 gemcitabine/Abraxane 05/05/2021 ?Cycle 2 gemcitabine/Abraxane 05/19/2021 ?Cycle 3 gemcitabine/Abraxane 06/02/2021 ?2.  Pain secondary to #1-improved ?3.  Weight loss-improved ?4.  Family  history of  pancreas cancer ?5.  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 ? ? ? ? ?Disposition: ?Kirsten [name] appears stable.  She will complete another cycle of gemcitabine/Abraxane today.  We will follow-up on the CA 19-9 from today.  She will return for an office visit and chemotherapy in 2 weeks. ? ?[name], MD ? ?06/02/2021  ?8:52 AM ? ? ?

## 2021-06-02 NOTE — Progress Notes (Signed)
Patient seen by Dr. Sherrill today ? ?Vitals are within treatment parameters. ? ?Labs reviewed by Dr. Sherrill and are within treatment parameters. ? ?Per physician team, patient is ready for treatment and there are NO modifications to the treatment plan.  ?

## 2021-06-02 NOTE — Patient Instructions (Signed)

## 2021-06-02 NOTE — Progress Notes (Signed)
Patient presents for treatment. RN assessment completed along with the following: ? ?Labs/vitals reviewed - Yes, and within treatment parameters.   ?Weight within 10% of previous measurement - Yes ?Informed consent completed and reflects current therapy/intent - Yes, on date 05/05/21             ?Provider progress note reviewed - Today's provider note is not yet available. I reviewed the most recent oncology provider progress note in chart dated 05/19/21. ?Treatment/Antibody/Supportive plan reviewed - Yes, and there are no adjustments needed for today's treatment. ?S&H and other orders reviewed - Yes, and there are no additional orders identified. ?Previous treatment date reviewed - Yes, and the appropriate amount of time has elapsed between treatments. ?Clinic Hand Off Received from - Merceda Elks, RN ? ?Patient to proceed with treatment.   ?

## 2021-06-02 NOTE — Patient Instructions (Signed)
[facility]  ? Discharge Instructions: ?Thank you for choosing [facility] to provide your oncology and hematology care.  ? ?If you have a lab appointment with the [facility], please go directly to the [facility] and check in at the registration area. ?  ?Wear comfortable clothing and clothing appropriate for easy access to any Portacath or PICC line.  ? ?We strive to give you quality time with your provider. You may need to reschedule your appointment if you arrive late (15 or more minutes).  Arriving late affects you and other patients whose appointments are after yours.  Also, if you miss three or more appointments without notifying the office, you may be dismissed from the clinic at the provider?s discretion.    ?  ?For prescription refill requests, have your pharmacy contact our office and allow 72 hours for refills to be completed.   ? ?Today you received the following chemotherapy and/or immunotherapy agents Abraxane, Gemzar    ?  ?To help prevent nausea and vomiting after your treatment, we encourage you to take your nausea medication as directed. ? ?BELOW ARE SYMPTOMS THAT SHOULD BE REPORTED IMMEDIATELY: ?*FEVER GREATER THAN 100.4 F (38 ?C) OR HIGHER ?*CHILLS OR SWEATING ?*NAUSEA AND VOMITING THAT IS NOT CONTROLLED WITH YOUR NAUSEA MEDICATION ?*UNUSUAL SHORTNESS OF BREATH ?*UNUSUAL BRUISING OR BLEEDING ?*URINARY PROBLEMS (pain or burning when urinating, or frequent urination) ?*BOWEL PROBLEMS (unusual diarrhea, constipation, pain near the anus) ?TENDERNESS IN MOUTH AND THROAT WITH OR WITHOUT PRESENCE OF ULCERS (sore throat, sores in mouth, or a toothache) ?UNUSUAL RASH, SWELLING OR PAIN  ?UNUSUAL VAGINAL DISCHARGE OR ITCHING  ? ?Items with * indicate a potential emergency and should be followed up as soon as possible or go to the Emergency Department if any problems should occur. ? ?Please show the CHEMOTHERAPY ALERT CARD or IMMUNOTHERAPY ALERT CARD at  check-in to the Emergency Department and triage nurse. ? ?Should you have questions after your visit or need to cancel or reschedule your appointment, please contact [facility]  Dept: [phone]  and follow the prompts.  Office hours are 8:00 a.m. to 4:30 p.m. Monday - Friday. Please note that voicemails left after 4:00 p.m. may not be returned until the following business day.  We are closed weekends and major holidays. You have access to a nurse at all times for urgent questions. Please call the main number to the clinic Dept: [phone] and follow the prompts. ? ? ?For any non-urgent questions, you may also contact your provider using MyChart. We now offer e-Visits for anyone 62 and older to request care online for non-urgent symptoms. For details visit [email or web address]. ?  ?Also download the MyChart app! Go to the app store, search "MyChart", open the app, select [facility], and log in with your MyChart username and password. ? ?Due to Covid, a mask is required upon entering the hospital/clinic. If you do not have a mask, one will be given to you upon arrival. For doctor visits, patients may have 1 support person aged 2 or older with them. For treatment visits, patients cannot have anyone with them due to current Covid guidelines and our immunocompromised population.  ? ?Nanoparticle Albumin-Bound Paclitaxel injection ?What is this medication? ?NANOPARTICLE ALBUMIN-BOUND PACLITAXEL (Na no PAHR ti kuhl  al BYOO muhn-bound  PAK li TAX el) is a chemotherapy drug. It targets fast dividing cells, like cancer cells, and causes these cells to die. This medicine is used  to treat advanced breast cancer, lung cancer, and pancreatic cancer. ?This medicine may be used for other purposes; ask your health care provider or pharmacist if you have questions. ?COMMON BRAND NAME(S): Abraxane ?What should I tell my care team before I take this medication? ?They need to know if you have any  of these conditions: ?kidney disease ?liver disease ?low blood counts, like low white cell, platelet, or red cell counts ?lung or breathing disease, like asthma ?tingling of the fingers or toes, or other nerve disorder ?an unusual or allergic reaction to paclitaxel, albumin, other chemotherapy, other medicines, foods, dyes, or preservatives ?pregnant or trying to get pregnant ?breast-feeding ?How should I use this medication? ?This drug is given as an infusion into a vein. It is administered in a hospital or clinic by a specially trained health care professional. ?Talk to your pediatrician regarding the use of this medicine in children. Special care may be needed. ?Overdosage: If you think you have taken too much of this medicine contact a poison control center or emergency room at once. ?NOTE: This medicine is only for you. Do not share this medicine with others. ?What if I miss a dose? ?It is important not to miss your dose. Call your doctor or health care professional if you are unable to keep an appointment. ?What may interact with this medication? ?This medicine may interact with the following medications: ?antiviral medicines for hepatitis, HIV or AIDS ?certain antibiotics like erythromycin and clarithromycin ?certain medicines for fungal infections like ketoconazole and itraconazole ?certain medicines for seizures like carbamazepine, phenobarbital, phenytoin ?gemfibrozil ?nefazodone ?rifampin ?St. John's wort ?This list may not describe all possible interactions. Give your health care provider a list of all the medicines, herbs, non-prescription drugs, or dietary supplements you use. Also tell them if you smoke, drink alcohol, or use illegal drugs. Some items may interact with your medicine. ?What should I watch for while using this medication? ?Your condition will be monitored carefully while you are receiving this medicine. You will need important blood work done while you are taking this medicine. ?This  medicine can cause serious allergic reactions. If you experience allergic reactions like skin rash, itching or hives, swelling of the face, lips, or tongue, tell your doctor or health care professional right away. ?In some cases, you may be given additional medicines to help with side effects. Follow all directions for their use. ?This drug may make you feel generally unwell. This is not uncommon, as chemotherapy can affect healthy cells as well as cancer cells. Report any side effects. Continue your course of treatment even though you feel ill unless your doctor tells you to stop. ?Call your doctor or health care professional for advice if you get a fever, chills or sore throat, or other symptoms of a cold or flu. Do not treat yourself. This drug decreases your body's ability to fight infections. Try to avoid being around people who are sick. ?This medicine may increase your risk to bruise or bleed. Call your doctor or health care professional if you notice any unusual bleeding. ?Be careful brushing and flossing your teeth or using a toothpick because you may get an infection or bleed more easily. If you have any dental work done, tell your dentist you are receiving this medicine. ?Avoid taking products that contain aspirin, acetaminophen, ibuprofen, naproxen, or ketoprofen unless instructed by your doctor. These medicines may hide a fever. ?Do not become pregnant while taking this medicine or for 6 months after stopping it. Women should  inform their doctor if they wish to become pregnant or think they might be pregnant. Men should not father a child while taking this medicine or for 3 months after stopping it. There is a potential for serious side effects to an unborn child. Talk to your health care professional or pharmacist for more information. ?Do not breast-feed an infant while taking this medicine or for 2 weeks after stopping it. ?This medicine may interfere with the ability to get pregnant or to father a  child. You should talk to your doctor or health care professional if you are concerned about your fertility. ?What side effects may I notice from receiving this medication? ?Side effects that you should report t

## 2021-06-03 LAB — CANCER ANTIGEN 19-9: CA 19-9: 34298 U/mL — ABNORMAL HIGH (ref 0–35)

## 2021-06-10 ENCOUNTER — Other Ambulatory Visit: Payer: Self-pay | Admitting: Oncology

## 2021-06-16 ENCOUNTER — Encounter: Payer: Self-pay | Admitting: *Deleted

## 2021-06-16 ENCOUNTER — Encounter: Payer: Self-pay | Admitting: Nurse Practitioner

## 2021-06-16 ENCOUNTER — Inpatient Hospital Stay: Payer: Medicaid Other

## 2021-06-16 ENCOUNTER — Inpatient Hospital Stay (HOSPITAL_BASED_OUTPATIENT_CLINIC_OR_DEPARTMENT_OTHER): Payer: Medicaid Other | Admitting: Nurse Practitioner

## 2021-06-16 VITALS — BP 140/90 | HR 63 | Temp 97.9°F | Resp 18 | Ht 63.0 in | Wt 129.4 lb

## 2021-06-16 DIAGNOSIS — C252 Malignant neoplasm of tail of pancreas: Secondary | ICD-10-CM | POA: Diagnosis not present

## 2021-06-16 DIAGNOSIS — Z5111 Encounter for antineoplastic chemotherapy: Secondary | ICD-10-CM | POA: Diagnosis not present

## 2021-06-16 LAB — CBC WITH DIFFERENTIAL (CANCER CENTER ONLY)
Abs Immature Granulocytes: 0.01 10*3/uL (ref 0.00–0.07)
Basophils Absolute: 0 10*3/uL (ref 0.0–0.1)
Basophils Relative: 0 %
Eosinophils Absolute: 0.1 10*3/uL (ref 0.0–0.5)
Eosinophils Relative: 1 %
HCT: 35 % — ABNORMAL LOW (ref 36.0–46.0)
Hemoglobin: 11 g/dL — ABNORMAL LOW (ref 12.0–15.0)
Immature Granulocytes: 0 %
Lymphocytes Relative: 30 %
Lymphs Abs: 2.2 10*3/uL (ref 0.7–4.0)
MCH: 28.7 pg (ref 26.0–34.0)
MCHC: 31.4 g/dL (ref 30.0–36.0)
MCV: 91.4 fL (ref 80.0–100.0)
Monocytes Absolute: 0.7 10*3/uL (ref 0.1–1.0)
Monocytes Relative: 10 %
Neutro Abs: 4.4 10*3/uL (ref 1.7–7.7)
Neutrophils Relative %: 59 %
Platelet Count: 274 10*3/uL (ref 150–400)
RBC: 3.83 MIL/uL — ABNORMAL LOW (ref 3.87–5.11)
RDW: 13.6 % (ref 11.5–15.5)
WBC Count: 7.4 10*3/uL (ref 4.0–10.5)
nRBC: 0 % (ref 0.0–0.2)

## 2021-06-16 LAB — CMP (CANCER CENTER ONLY)
ALT: 15 U/L (ref 0–44)
AST: 19 U/L (ref 15–41)
Albumin: 3.8 g/dL (ref 3.5–5.0)
Alkaline Phosphatase: 268 U/L — ABNORMAL HIGH (ref 38–126)
Anion gap: 10 (ref 5–15)
BUN: 7 mg/dL — ABNORMAL LOW (ref 8–23)
CO2: 26 mmol/L (ref 22–32)
Calcium: 10 mg/dL (ref 8.9–10.3)
Chloride: 102 mmol/L (ref 98–111)
Creatinine: 0.7 mg/dL (ref 0.44–1.00)
GFR, Estimated: >60 mL/min (ref >60)
Glucose, Bld: 162 mg/dL — ABNORMAL HIGH (ref 70–99)
Potassium: 3.6 mmol/L (ref 3.5–5.1)
Sodium: 138 mmol/L (ref 135–145)
Total Bilirubin: 0.4 mg/dL (ref 0.3–1.2)
Total Protein: 7.3 g/dL (ref 6.5–8.1)

## 2021-06-16 MED ORDER — PACLITAXEL PROTEIN-BOUND CHEMO INJECTION 100 MG
100.0000 mg/m2 | Freq: Once | INTRAVENOUS | Status: AC
  Administered 2021-06-16: 175 mg via INTRAVENOUS
  Filled 2021-06-16: qty 35

## 2021-06-16 MED ORDER — SODIUM CHLORIDE 0.9% FLUSH
10.0000 mL | INTRAVENOUS | Status: DC | PRN
  Administered 2021-06-16: 10 mL

## 2021-06-16 MED ORDER — ONDANSETRON HCL 8 MG PO TABS
8.0000 mg | ORAL_TABLET | Freq: Once | ORAL | Status: AC
  Administered 2021-06-16: 8 mg via ORAL
  Filled 2021-06-16: qty 1

## 2021-06-16 MED ORDER — SODIUM CHLORIDE 0.9 % IV SOLN
1000.0000 mg/m2 | Freq: Once | INTRAVENOUS | Status: AC
  Administered 2021-06-16: 1634 mg via INTRAVENOUS
  Filled 2021-06-16: qty 26.3

## 2021-06-16 MED ORDER — HEPARIN SOD (PORK) LOCK FLUSH 100 UNIT/ML IV SOLN
500.0000 [IU] | Freq: Once | INTRAVENOUS | Status: AC | PRN
  Administered 2021-06-16: 500 [IU]

## 2021-06-16 MED ORDER — SODIUM CHLORIDE 0.9 % IV SOLN: Freq: Once | INTRAVENOUS | Status: AC

## 2021-06-16 MED ORDER — ONDANSETRON HCL 8 MG PO TABS: 8.0000 mg | ORAL_TABLET | Freq: Three times a day (TID) | ORAL | 1 refills | Status: DC | PRN

## 2021-06-16 NOTE — Progress Notes (Signed)
Patient seen by Ned Card NP today ? ?Vitals are within treatment parameters. ? ?Labs reviewed by Ned Card NP and are within treatment parameters. ? ?Per physician team, patient is ready for treatment. Please note that modifications are being made to the treatment plan including Compazine premed changed to po  zofran due to nausea on day 1 of each treatment.  ?

## 2021-06-16 NOTE — Progress Notes (Signed)
Patient presents for treatment. RN assessment completed along with the following: ? ?Labs/vitals reviewed - Yes, and within treatment parameters.   ?Weight within 10% of previous measurement - Yes ?Informed consent completed and reflects current therapy/intent - Yes, on date 05/05/21             ?Provider progress note reviewed - Yes, today's provider note was reviewed. ?Treatment/Antibody/Supportive plan reviewed - Yes, and Zofran to replace compazine ?S&H and other orders reviewed - Yes, and there are no additional orders identified. ?Previous treatment date reviewed - Yes, and the appropriate amount of time has elapsed between treatments. ?Clinic Hand Off Received from - Cristy Friedlander, RN ? ?Patient to proceed with treatment.  ? ?

## 2021-06-16 NOTE — Progress Notes (Signed)
?[facility] ?OFFICE PROGRESS NOTE ? ? ?Diagnosis: Pancreas cancer ? ?INTERVAL HISTORY:  ? ?Kirsten [name] returns as scheduled.  She completed another cycle of gemcitabine/Abraxane 06/02/2021.  She reports being nauseated by the time she leaves the office.  She has mild nausea for 1 to 2 days.  Compazine is not really effective.  No mouth sores.  No diarrhea.  No fever.  No rash. ? ?Objective: ? ?Vital signs in last 24 hours: ? ?Blood pressure 140/90, pulse 63, temperature 97.9 ?F (36.6 ?C), temperature source Oral, resp. rate 18, height '5\' 3"'$  (1.6 m), weight 129 lb 6.4 oz (58.7 kg), SpO2 98 %. ?  ? ?HEENT: No thrush or ulcers. ?Resp: Lungs clear bilaterally. ?Cardio: Regular rate and rhythm. ?GI: Abdomen is soft.  Palpable liver edge with associated tenderness. ?Vascular: No leg edema. ?Skin: Palms without erythema. ?Port-A-Cath without erythema. ? ? ?Lab Results: ? ?Lab Results  ?Component Value Date  ? WBC 7.4 06/16/2021  ? HGB 11.0 (L) 06/16/2021  ? HCT 35.0 (L) 06/16/2021  ? MCV 91.4 06/16/2021  ? PLT 274 06/16/2021  ? NEUTROABS 4.4 06/16/2021  ? ? ?Imaging: ? ?No results found. ? ?Medications: I have reviewed the patient's current medications. ? ?Assessment/Plan: ?Metastatic pancreas cancer ?Right upper quadrant ultrasound 06/17/2020-multiple hypoechoic liver lesions ?CT abdomen/pelvis 06/17/2020- pancreas tail mass, multiple liver metastases, enlarged periaortic nodes, right ovary metastasis?,  Nodularity at the left paracolic gutter and posterior/inferior stomach suggesting peritoneal tumor spread ?CT chest 06/17/2020- scattered small pulmonary nodules consistent with metastatic disease ?Ultrasound-guided biopsy of a left liver mass 06/18/2020- adenocarcinoma, morphology compatible with metastatic pancreatic adenocarcinoma, MSS, tumor mutation burden 0 ?Cycle 1 FOLFOX 07/01/2020 ?Cycle 2 FOLFOX 07/16/2020 ?Cycle 3 FOLFOX 07/31/2020 ?Cycle 4 FOLFIRINOX 08/13/2020 ?Cycle 5 FOLFIRINOX 09/03/2020, Udenyca ?CTs  09/13/2020-unchanged nodule medial left lower lobe; stable nodal and likely peritoneal disease in the abdomen/pelvis; unchanged pancreatic tail mass; multifocal hepatic metastases, mildly progressive ?Cycle 6 FOLFIRINOX 09/16/2020, Oxaliplatin held ?Cycle 7 FOLFIRINOX 09/30/2020 ?Cycle 8 FOLFIRINOX 10/14/2020, oxaliplatin held ?Cycle 9 FOLFIRINOX 10/28/2020, oxaliplatin held ?Cycle 10 FOLFIRINOX 11/11/2020, oxaliplatin held ?Cycle 11 FOLFIRINOX 11/25/2020, oxaliplatin held ?CTs 12/06/2020-primary pancreatic tail tumor decreased in size.  Widespread liver metastases all decreased.  Previously visualized peritoneal nodule adjacent to the distal stomach resolved.  Left lower lobe pulmonary nodule stable.  No new or progressive metastatic disease. ?Cycle 12 FOLFIRI 12/09/2020 ?Cycle 13 FOLFIRI 12/23/2020 ?Cycle 14 FOLFIRI 01/06/2021 ?Cycle 15 FOLFIRI 01/20/2021 ?Cycle 16 FOLFIRI 02/03/2021 ?Cycle 17 FOLFIRI 02/17/2021 ?CTs 02/27/2021-slight decrease in size of pancreas tail mass and hypodense liver lesions, unchanged left lower lobe nodule, enlargement of a single peritoneal nodule anterior to the distal descending colon ?Cycle 18 FOLFIRI 03/04/2021 ?Cycle 19 FOLFIRI 03/17/2021 ?Cycle 20 FOLFIRI 03/31/2021 ?Cycle 21 FOLFIRI 04/14/2021 ?CTs 04/24/2021-unchanged nodule left lower lobe.  Numerous hypodense liver lesions, a few of which are clearly increased in size.  Unchanged mass pancreatic tail. ?Cycle 1 gemcitabine/Abraxane 05/05/2021 ?Cycle 2 gemcitabine/Abraxane 05/19/2021 ?Cycle 3 gemcitabine/Abraxane 06/02/2021 ?Cycle 4 gemcitabine/Abraxane 06/16/2021 ?2.  Pain secondary to #1-improved ?3.  Weight loss-improved ?4.  Family history of pancreas cancer ?5.  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  ? ?Disposition: Kirsten [name] appears unchanged.  She has completed 3 cycles of gemcitabine/Abraxane.  Plan to  proceed with cycle 4 today as scheduled.  She is having nausea for a few days after each treatment.  We will adjust the antiemetic premedication today from  Compazine to Zofran.  Prescription sent to her pharmacy for Zofran 8 mg every 8 hours as needed.  She understands to contact the office if this is not effective. ? ?CBC and chemistry panel reviewed.  Labs adequate to proceed with treatment. ? ?She will return for lab, follow-up, cycle 5 gemcitabine/Abraxane in 2 weeks. ? ? ? ?Ned Card ANP/GNP-BC  ? ?06/16/2021  ?9:43 AM ? ? ? ? ? ? ? ?

## 2021-06-16 NOTE — Patient Instructions (Signed)
[facility]   ?Discharge Instructions: ?Thank you for choosing [facility] to provide your oncology and hematology care.  ? ?If you have a lab appointment with the [facility], please go directly to the [facility] and check in at the registration area. ?  ?Wear comfortable clothing and clothing appropriate for easy access to any Portacath or PICC line.  ? ?We strive to give you quality time with your provider. You may need to reschedule your appointment if you arrive late (15 or more minutes).  Arriving late affects you and other patients whose appointments are after yours.  Also, if you miss three or more appointments without notifying the office, you may be dismissed from the clinic at the provider?s discretion.    ?  ?For prescription refill requests, have your pharmacy contact our office and allow 72 hours for refills to be completed.   ? ?Today you received the following chemotherapy and/or immunotherapy agents Abraxane, Gemzar    ?  ?To help prevent nausea and vomiting after your treatment, we encourage you to take your nausea medication as directed. ? ?BELOW ARE SYMPTOMS THAT SHOULD BE REPORTED IMMEDIATELY: ?*FEVER GREATER THAN 100.4 F (38 ?C) OR HIGHER ?*CHILLS OR SWEATING ?*NAUSEA AND VOMITING THAT IS NOT CONTROLLED WITH YOUR NAUSEA MEDICATION ?*UNUSUAL SHORTNESS OF BREATH ?*UNUSUAL BRUISING OR BLEEDING ?*URINARY PROBLEMS (pain or burning when urinating, or frequent urination) ?*BOWEL PROBLEMS (unusual diarrhea, constipation, pain near the anus) ?TENDERNESS IN MOUTH AND THROAT WITH OR WITHOUT PRESENCE OF ULCERS (sore throat, sores in mouth, or a toothache) ?UNUSUAL RASH, SWELLING OR PAIN  ?UNUSUAL VAGINAL DISCHARGE OR ITCHING  ? ?Items with * indicate a potential emergency and should be followed up as soon as possible or go to the Emergency Department if any problems should occur. ? ?Please show the CHEMOTHERAPY ALERT CARD or IMMUNOTHERAPY ALERT CARD at  check-in to the Emergency Department and triage nurse. ? ?Should you have questions after your visit or need to cancel or reschedule your appointment, please contact [facility]  Dept: [phone]  and follow the prompts.  Office hours are 8:00 a.m. to 4:30 p.m. Monday - Friday. Please note that voicemails left after 4:00 p.m. may not be returned until the following business day.  We are closed weekends and major holidays. You have access to a nurse at all times for urgent questions. Please call the main number to the clinic Dept: [phone] and follow the prompts. ? ? ?For any non-urgent questions, you may also contact your provider using MyChart. We now offer e-Visits for anyone 81 and older to request care online for non-urgent symptoms. For details visit [email or web address]. ?  ?Also download the MyChart app! Go to the app store, search "MyChart", open the app, select [facility], and log in with your MyChart username and password. ? ?Due to Covid, a mask is required upon entering the hospital/clinic. If you do not have a mask, one will be given to you upon arrival. For doctor visits, patients may have 1 support person aged 57 or older with them. For treatment visits, patients cannot have anyone with them due to current Covid guidelines and our immunocompromised population.  ? ?Nanoparticle Albumin-Bound Paclitaxel injection ?What is this medication? ?NANOPARTICLE ALBUMIN-BOUND PACLITAXEL (Na no PAHR ti kuhl  al BYOO muhn-bound  PAK li TAX el) is a chemotherapy drug. It targets fast dividing cells, like cancer cells, and causes these cells to die. This medicine is used  to treat advanced breast cancer, lung cancer, and pancreatic cancer. ?This medicine may be used for other purposes; ask your health care provider or pharmacist if you have questions. ?COMMON BRAND NAME(S): Abraxane ?What should I tell my care team before I take this medication? ?They need to know if you have any  of these conditions: ?kidney disease ?liver disease ?low blood counts, like low white cell, platelet, or red cell counts ?lung or breathing disease, like asthma ?tingling of the fingers or toes, or other nerve disorder ?an unusual or allergic reaction to paclitaxel, albumin, other chemotherapy, other medicines, foods, dyes, or preservatives ?pregnant or trying to get pregnant ?breast-feeding ?How should I use this medication? ?This drug is given as an infusion into a vein. It is administered in a hospital or clinic by a specially trained health care professional. ?Talk to your pediatrician regarding the use of this medicine in children. Special care may be needed. ?Overdosage: If you think you have taken too much of this medicine contact a poison control center or emergency room at once. ?NOTE: This medicine is only for you. Do not share this medicine with others. ?What if I miss a dose? ?It is important not to miss your dose. Call your doctor or health care professional if you are unable to keep an appointment. ?What may interact with this medication? ?This medicine may interact with the following medications: ?antiviral medicines for hepatitis, HIV or AIDS ?certain antibiotics like erythromycin and clarithromycin ?certain medicines for fungal infections like ketoconazole and itraconazole ?certain medicines for seizures like carbamazepine, phenobarbital, phenytoin ?gemfibrozil ?nefazodone ?rifampin ?St. John's wort ?This list may not describe all possible interactions. Give your health care provider a list of all the medicines, herbs, non-prescription drugs, or dietary supplements you use. Also tell them if you smoke, drink alcohol, or use illegal drugs. Some items may interact with your medicine. ?What should I watch for while using this medication? ?Your condition will be monitored carefully while you are receiving this medicine. You will need important blood work done while you are taking this medicine. ?This  medicine can cause serious allergic reactions. If you experience allergic reactions like skin rash, itching or hives, swelling of the face, lips, or tongue, tell your doctor or health care professional right away. ?In some cases, you may be given additional medicines to help with side effects. Follow all directions for their use. ?This drug may make you feel generally unwell. This is not uncommon, as chemotherapy can affect healthy cells as well as cancer cells. Report any side effects. Continue your course of treatment even though you feel ill unless your doctor tells you to stop. ?Call your doctor or health care professional for advice if you get a fever, chills or sore throat, or other symptoms of a cold or flu. Do not treat yourself. This drug decreases your body's ability to fight infections. Try to avoid being around people who are sick. ?This medicine may increase your risk to bruise or bleed. Call your doctor or health care professional if you notice any unusual bleeding. ?Be careful brushing and flossing your teeth or using a toothpick because you may get an infection or bleed more easily. If you have any dental work done, tell your dentist you are receiving this medicine. ?Avoid taking products that contain aspirin, acetaminophen, ibuprofen, naproxen, or ketoprofen unless instructed by your doctor. These medicines may hide a fever. ?Do not become pregnant while taking this medicine or for 6 months after stopping it. Women should  inform their doctor if they wish to become pregnant or think they might be pregnant. Men should not father a child while taking this medicine or for 3 months after stopping it. There is a potential for serious side effects to an unborn child. Talk to your health care professional or pharmacist for more information. ?Do not breast-feed an infant while taking this medicine or for 2 weeks after stopping it. ?This medicine may interfere with the ability to get pregnant or to father a  child. You should talk to your doctor or health care professional if you are concerned about your fertility. ?What side effects may I notice from receiving this medication? ?Side effects that you should report t

## 2021-06-16 NOTE — Patient Instructions (Signed)

## 2021-06-17 LAB — CANCER ANTIGEN 19-9: CA 19-9: 40622 U/mL — ABNORMAL HIGH (ref 0–35)

## 2021-06-29 ENCOUNTER — Other Ambulatory Visit: Payer: Self-pay | Admitting: Oncology

## 2021-06-30 ENCOUNTER — Inpatient Hospital Stay: Payer: Medicaid Other

## 2021-06-30 ENCOUNTER — Inpatient Hospital Stay (HOSPITAL_BASED_OUTPATIENT_CLINIC_OR_DEPARTMENT_OTHER): Payer: Medicaid Other | Admitting: Oncology

## 2021-06-30 ENCOUNTER — Inpatient Hospital Stay: Payer: Medicaid Other | Attending: Oncology

## 2021-06-30 ENCOUNTER — Encounter: Payer: Self-pay | Admitting: *Deleted

## 2021-06-30 VITALS — BP 167/90 | HR 100 | Temp 98.2°F | Resp 18 | Ht 63.0 in | Wt 129.2 lb

## 2021-06-30 DIAGNOSIS — R110 Nausea: Secondary | ICD-10-CM | POA: Diagnosis not present

## 2021-06-30 DIAGNOSIS — Z5111 Encounter for antineoplastic chemotherapy: Secondary | ICD-10-CM | POA: Insufficient documentation

## 2021-06-30 DIAGNOSIS — C252 Malignant neoplasm of tail of pancreas: Secondary | ICD-10-CM | POA: Diagnosis present

## 2021-06-30 DIAGNOSIS — C787 Secondary malignant neoplasm of liver and intrahepatic bile duct: Secondary | ICD-10-CM | POA: Diagnosis not present

## 2021-06-30 LAB — CBC WITH DIFFERENTIAL (CANCER CENTER ONLY)
Abs Immature Granulocytes: 0.01 10*3/uL (ref 0.00–0.07)
Basophils Absolute: 0 10*3/uL (ref 0.0–0.1)
Basophils Relative: 1 %
Eosinophils Absolute: 0.2 10*3/uL (ref 0.0–0.5)
Eosinophils Relative: 4 %
HCT: 34.9 % — ABNORMAL LOW (ref 36.0–46.0)
Hemoglobin: 10.9 g/dL — ABNORMAL LOW (ref 12.0–15.0)
Immature Granulocytes: 0 %
Lymphocytes Relative: 31 %
Lymphs Abs: 1.8 10*3/uL (ref 0.7–4.0)
MCH: 28.5 pg (ref 26.0–34.0)
MCHC: 31.2 g/dL (ref 30.0–36.0)
MCV: 91.4 fL (ref 80.0–100.0)
Monocytes Absolute: 0.7 10*3/uL (ref 0.1–1.0)
Monocytes Relative: 12 %
Neutro Abs: 3 10*3/uL (ref 1.7–7.7)
Neutrophils Relative %: 52 %
Platelet Count: 250 10*3/uL (ref 150–400)
RBC: 3.82 MIL/uL — ABNORMAL LOW (ref 3.87–5.11)
RDW: 13.9 % (ref 11.5–15.5)
WBC Count: 5.7 10*3/uL (ref 4.0–10.5)
nRBC: 0 % (ref 0.0–0.2)

## 2021-06-30 LAB — CMP (CANCER CENTER ONLY)
ALT: 23 U/L (ref 0–44)
AST: 27 U/L (ref 15–41)
Albumin: 3.8 g/dL (ref 3.5–5.0)
Alkaline Phosphatase: 278 U/L — ABNORMAL HIGH (ref 38–126)
Anion gap: 9 (ref 5–15)
BUN: 8 mg/dL (ref 8–23)
CO2: 27 mmol/L (ref 22–32)
Calcium: 10.4 mg/dL — ABNORMAL HIGH (ref 8.9–10.3)
Chloride: 104 mmol/L (ref 98–111)
Creatinine: 0.7 mg/dL (ref 0.44–1.00)
GFR, Estimated: >60 mL/min (ref >60)
Glucose, Bld: 190 mg/dL — ABNORMAL HIGH (ref 70–99)
Potassium: 3.8 mmol/L (ref 3.5–5.1)
Sodium: 140 mmol/L (ref 135–145)
Total Bilirubin: 0.5 mg/dL (ref 0.3–1.2)
Total Protein: 7.2 g/dL (ref 6.5–8.1)

## 2021-06-30 MED ORDER — SODIUM CHLORIDE 0.9 % IV SOLN: Freq: Once | INTRAVENOUS | Status: AC

## 2021-06-30 MED ORDER — SODIUM CHLORIDE 0.9 % IV SOLN
1000.0000 mg/m2 | Freq: Once | INTRAVENOUS | Status: AC
  Administered 2021-06-30: 1634 mg via INTRAVENOUS
  Filled 2021-06-30: qty 26.3

## 2021-06-30 MED ORDER — SODIUM CHLORIDE 0.9% FLUSH
10.0000 mL | INTRAVENOUS | Status: DC | PRN
  Administered 2021-06-30: 10 mL

## 2021-06-30 MED ORDER — ONDANSETRON HCL 8 MG PO TABS
8.0000 mg | ORAL_TABLET | Freq: Once | ORAL | Status: AC
  Administered 2021-06-30: 8 mg via ORAL
  Filled 2021-06-30: qty 1

## 2021-06-30 MED ORDER — PACLITAXEL PROTEIN-BOUND CHEMO INJECTION 100 MG
100.0000 mg/m2 | Freq: Once | INTRAVENOUS | Status: AC
  Administered 2021-06-30: 175 mg via INTRAVENOUS
  Filled 2021-06-30: qty 35

## 2021-06-30 MED ORDER — HEPARIN SOD (PORK) LOCK FLUSH 100 UNIT/ML IV SOLN
500.0000 [IU] | Freq: Once | INTRAVENOUS | Status: AC | PRN
  Administered 2021-06-30: 500 [IU]

## 2021-06-30 NOTE — Progress Notes (Signed)
?[place] ?OFFICE PROGRESS NOTE ? ? ?Diagnosis: Pancreas cancer ? ?INTERVAL HISTORY:  ? ?Ms. Bundick completed another cycle of gemcitabine/Abraxane on 06/16/2021.  Nausea was improved with the addition of ondansetron.  She had a rash at the left arm a week following chemotherapy.  She thinks this may have been related to eating fresh pineapple.  The rash resolved after taking Benadryl.  No change in neuropathy symptoms.  No abdominal pain. ? ?Objective: ? ?Vital signs in last 24 hours: ? ?Blood pressure (!) 167/90, pulse 100, temperature 98.2 ?F (36.8 ?C), temperature source Oral, resp. rate 18, height '5\' 3"'$  (1.6 m), weight 129 lb 3.2 oz (58.6 kg), SpO2 100 %. ?  ? ?HEENT: No thrush or ulcers ?Resp: Lungs clear bilaterally ?Cardio: Regular rate and rhythm ?GI: No hepatosplenomegaly, nontender ?Vascular: No leg edema  ?Skin: No erythema or rash of the left arm, dry scaling at the mid back ? ?Portacath/PICC-without erythema ? ?Lab Results: ? ?Lab Results  ?Component Value Date  ? WBC 5.7 06/30/2021  ? HGB 10.9 (L) 06/30/2021  ? HCT 34.9 (L) 06/30/2021  ? MCV 91.4 06/30/2021  ? PLT 250 06/30/2021  ? NEUTROABS 3.0 06/30/2021  ? ? ?CMP  ?Lab Results  ?Component Value Date  ? NA 138 06/16/2021  ? K 3.6 06/16/2021  ? CL 102 06/16/2021  ? CO2 26 06/16/2021  ? GLUCOSE 162 (H) 06/16/2021  ? BUN 7 (L) 06/16/2021  ? CREATININE 0.70 06/16/2021  ? CALCIUM 10.0 06/16/2021  ? PROT 7.3 06/16/2021  ? ALBUMIN 3.8 06/16/2021  ? AST 19 06/16/2021  ? ALT 15 06/16/2021  ? ALKPHOS 268 (H) 06/16/2021  ? BILITOT 0.4 06/16/2021  ? GFRNONAA >60 06/16/2021  ? ? ?Lab Results  ?Component Value Date  ? BMW413 24,401 (H) 06/16/2021  ? ? ? ?Medications: I have reviewed the patient's current medications. ? ? ?Assessment/Plan: ?Metastatic pancreas cancer ?Right upper quadrant ultrasound 06/17/2020-multiple hypoechoic liver lesions ?CT abdomen/pelvis 06/17/2020- pancreas tail mass, multiple liver metastases, enlarged periaortic nodes,  right ovary metastasis?,  Nodularity at the left paracolic gutter and posterior/inferior stomach suggesting peritoneal tumor spread ?CT chest 06/17/2020- scattered small pulmonary nodules consistent with metastatic disease ?Ultrasound-guided biopsy of a left liver mass 06/18/2020- adenocarcinoma, morphology compatible with metastatic pancreatic adenocarcinoma, MSS, tumor mutation burden 0 ?Cycle 1 FOLFOX 07/01/2020 ?Cycle 2 FOLFOX 07/16/2020 ?Cycle 3 FOLFOX 07/31/2020 ?Cycle 4 FOLFIRINOX 08/13/2020 ?Cycle 5 FOLFIRINOX 09/03/2020, Udenyca ?CTs 09/13/2020-unchanged nodule medial left lower lobe; stable nodal and likely peritoneal disease in the abdomen/pelvis; unchanged pancreatic tail mass; multifocal hepatic metastases, mildly progressive ?Cycle 6 FOLFIRINOX 09/16/2020, Oxaliplatin held ?Cycle 7 FOLFIRINOX 09/30/2020 ?Cycle 8 FOLFIRINOX 10/14/2020, oxaliplatin held ?Cycle 9 FOLFIRINOX 10/28/2020, oxaliplatin held ?Cycle 10 FOLFIRINOX 11/11/2020, oxaliplatin held ?Cycle 11 FOLFIRINOX 11/25/2020, oxaliplatin held ?CTs 12/06/2020-primary pancreatic tail tumor decreased in size.  Widespread liver metastases all decreased.  Previously visualized peritoneal nodule adjacent to the distal stomach resolved.  Left lower lobe pulmonary nodule stable.  No new or progressive metastatic disease. ?Cycle 12 FOLFIRI 12/09/2020 ?Cycle 13 FOLFIRI 12/23/2020 ?Cycle 14 FOLFIRI 01/06/2021 ?Cycle 15 FOLFIRI 01/20/2021 ?Cycle 16 FOLFIRI 02/03/2021 ?Cycle 17 FOLFIRI 02/17/2021 ?CTs 02/27/2021-slight decrease in size of pancreas tail mass and hypodense liver lesions, unchanged left lower lobe nodule, enlargement of a single peritoneal nodule anterior to the distal descending colon ?Cycle 18 FOLFIRI 03/04/2021 ?Cycle 19 FOLFIRI 03/17/2021 ?Cycle 20 FOLFIRI 03/31/2021 ?Cycle 21 FOLFIRI 04/14/2021 ?CTs 04/24/2021-unchanged nodule left lower lobe.  Numerous hypodense liver lesions, a few of which  are clearly increased in size.  Unchanged mass pancreatic tail. ?Cycle 1  gemcitabine/Abraxane 05/05/2021 ?Cycle 2 gemcitabine/Abraxane 05/19/2021 ?Cycle 3 gemcitabine/Abraxane 06/02/2021 ?Cycle 4 gemcitabine/Abraxane 06/16/2021 ?Cycle 5 gemcitabine/Abraxane 06/30/2021 ?2.  Pain secondary to #1-improved ?3.  Weight loss-improved ?4.  Family history of pancreas cancer ?5.  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  ? ? ?Disposition: ?Kirsten [name] appears unchanged.  She will complete cycle 5 gemcitabine/Abraxane today.  She will undergo a restaging CT evaluation after this cycle.  She will return for an office visit in 2 weeks. ?She will call for a rash following this cycle of chemotherapy. ? ?[name], MD ? ?06/30/2021  ?9:10 AM ? ? ?

## 2021-06-30 NOTE — Progress Notes (Signed)
Patient seen by Dr. Sherrill today ? ?Vitals are within treatment parameters. ? ?Labs reviewed by Dr. Sherrill and are within treatment parameters. ? ?Per physician team, patient is ready for treatment and there are NO modifications to the treatment plan.  ?

## 2021-07-02 LAB — CANCER ANTIGEN 19-9: CA 19-9: 40991 U/mL — ABNORMAL HIGH (ref 0–35)

## 2021-07-11 ENCOUNTER — Inpatient Hospital Stay: Payer: Medicaid Other

## 2021-07-11 ENCOUNTER — Ambulatory Visit (HOSPITAL_BASED_OUTPATIENT_CLINIC_OR_DEPARTMENT_OTHER)
Admission: RE | Admit: 2021-07-11 | Discharge: 2021-07-11 | Disposition: A | Discharge status: Home / Self Care | Payer: Medicaid Other | Source: Ambulatory Visit | Attending: Oncology | Admitting: Oncology

## 2021-07-11 ENCOUNTER — Encounter (HOSPITAL_BASED_OUTPATIENT_CLINIC_OR_DEPARTMENT_OTHER): Payer: Self-pay

## 2021-07-11 DIAGNOSIS — C252 Malignant neoplasm of tail of pancreas: Secondary | ICD-10-CM | POA: Diagnosis present

## 2021-07-11 MED ORDER — IOHEXOL 300 MG/ML  SOLN
80.0000 mL | Freq: Once | INTRAMUSCULAR | Status: AC | PRN
Start: 2021-07-11 — End: 2021-07-11
  Administered 2021-07-11: 80 mL via INTRAVENOUS

## 2021-07-11 MED ORDER — HEPARIN SOD (PORK) LOCK FLUSH 100 UNIT/ML IV SOLN
500.0000 [IU] | Freq: Once | INTRAVENOUS | Status: AC
  Administered 2021-07-11: 500 [IU] via INTRAVENOUS

## 2021-07-11 NOTE — Patient Instructions (Signed)

## 2021-07-13 ENCOUNTER — Other Ambulatory Visit: Payer: Self-pay | Admitting: Oncology

## 2021-07-14 ENCOUNTER — Inpatient Hospital Stay: Payer: Medicaid Other

## 2021-07-14 ENCOUNTER — Encounter: Payer: Self-pay | Admitting: Oncology

## 2021-07-14 ENCOUNTER — Inpatient Hospital Stay (HOSPITAL_BASED_OUTPATIENT_CLINIC_OR_DEPARTMENT_OTHER): Payer: Medicaid Other | Admitting: Oncology

## 2021-07-14 VITALS — BP 162/90 | HR 100 | Temp 98.2°F | Resp 18 | Ht 63.0 in | Wt 127.0 lb

## 2021-07-14 DIAGNOSIS — C252 Malignant neoplasm of tail of pancreas: Secondary | ICD-10-CM

## 2021-07-14 DIAGNOSIS — Z95828 Presence of other vascular implants and grafts: Secondary | ICD-10-CM

## 2021-07-14 DIAGNOSIS — Z5111 Encounter for antineoplastic chemotherapy: Secondary | ICD-10-CM | POA: Diagnosis not present

## 2021-07-14 LAB — CBC WITH DIFFERENTIAL (CANCER CENTER ONLY)
Abs Immature Granulocytes: 0.02 10*3/uL (ref 0.00–0.07)
Basophils Absolute: 0 10*3/uL (ref 0.0–0.1)
Basophils Relative: 0 %
Eosinophils Absolute: 0.2 10*3/uL (ref 0.0–0.5)
Eosinophils Relative: 4 %
HCT: 34.9 % — ABNORMAL LOW (ref 36.0–46.0)
Hemoglobin: 11 g/dL — ABNORMAL LOW (ref 12.0–15.0)
Immature Granulocytes: 0 %
Lymphocytes Relative: 37 %
Lymphs Abs: 2.5 10*3/uL (ref 0.7–4.0)
MCH: 28.6 pg (ref 26.0–34.0)
MCHC: 31.5 g/dL (ref 30.0–36.0)
MCV: 90.6 fL (ref 80.0–100.0)
Monocytes Absolute: 0.8 10*3/uL (ref 0.1–1.0)
Monocytes Relative: 12 %
Neutro Abs: 3.1 10*3/uL (ref 1.7–7.7)
Neutrophils Relative %: 47 %
Platelet Count: 286 10*3/uL (ref 150–400)
RBC: 3.85 MIL/uL — ABNORMAL LOW (ref 3.87–5.11)
RDW: 14.2 % (ref 11.5–15.5)
WBC Count: 6.7 10*3/uL (ref 4.0–10.5)
nRBC: 0 % (ref 0.0–0.2)

## 2021-07-14 LAB — CMP (CANCER CENTER ONLY)
ALT: 19 U/L (ref 0–44)
AST: 26 U/L (ref 15–41)
Albumin: 3.7 g/dL (ref 3.5–5.0)
Alkaline Phosphatase: 294 U/L — ABNORMAL HIGH (ref 38–126)
Anion gap: 9 (ref 5–15)
BUN: 7 mg/dL — ABNORMAL LOW (ref 8–23)
CO2: 27 mmol/L (ref 22–32)
Calcium: 9.9 mg/dL (ref 8.9–10.3)
Chloride: 104 mmol/L (ref 98–111)
Creatinine: 0.75 mg/dL (ref 0.44–1.00)
GFR, Estimated: >60 mL/min (ref >60)
Glucose, Bld: 138 mg/dL — ABNORMAL HIGH (ref 70–99)
Potassium: 3.8 mmol/L (ref 3.5–5.1)
Sodium: 140 mmol/L (ref 135–145)
Total Bilirubin: 0.5 mg/dL (ref 0.3–1.2)
Total Protein: 7.5 g/dL (ref 6.5–8.1)

## 2021-07-14 MED ORDER — SODIUM CHLORIDE 0.9% FLUSH
10.0000 mL | INTRAVENOUS | Status: AC | PRN
  Administered 2021-07-14: 10 mL via INTRAVENOUS

## 2021-07-14 MED ORDER — HEPARIN SOD (PORK) LOCK FLUSH 100 UNIT/ML IV SOLN
500.0000 [IU] | Freq: Once | INTRAVENOUS | Status: AC
  Administered 2021-07-14: 500 [IU] via INTRAVENOUS

## 2021-07-14 NOTE — Addendum Note (Signed)
Addended by: Lenox Ponds E on: 07/14/2021 09:26 AM ? ? Modules accepted: Orders ? ?

## 2021-07-14 NOTE — Patient Instructions (Signed)

## 2021-07-14 NOTE — Progress Notes (Signed)
DISCONTINUE ON PATHWAY REGIMEN - Pancreatic Adenocarcinoma ? ? ?  A cycle is every 28 days: ?    Nab-paclitaxel (protein bound)  ?    Gemcitabine  ? ?**Always confirm dose/schedule in your pharmacy ordering system** ? ?REASON: Disease Progression ?PRIOR TREATMENT: PANOS51: Nab-Paclitaxel (Abraxane) 125 mg/m2 D1, 8, 15 + Gemcitabine 1,000 mg/m2 D1, 8, 15 q28 Days Until Progression or Toxicity ?TREATMENT RESPONSE: Progressive Disease (PD) ? ?START OFF PATHWAY REGIMEN - Pancreatic Adenocarcinoma ? ? ?[id number]:mFOLFIRINOX (Leucovorin IV D1 + Fluorouracil CIV D1,2 + Irinotecan IV D1 + Oxaliplatin IV D1) q14 Days: ?  A cycle is every 14 days: ?    Oxaliplatin  ?    Leucovorin  ?    Irinotecan  ?    Fluorouracil  ? ?**Always confirm dose/schedule in your pharmacy ordering system** ? ?Patient Characteristics: ?Metastatic Disease, Third Line and Beyond, MSS/pMMR or MSI Unknown ?Therapeutic Status: Metastatic Disease ?Line of Therapy: Third Line and Beyond ?Microsatellite/Mismatch Repair Status: MSS/pMMR ?Intent of Therapy: ?Non-Curative / Palliative Intent, Discussed with Patient ?

## 2021-07-14 NOTE — Progress Notes (Signed)
?[place] ?OFFICE PROGRESS NOTE ? ? ?Diagnosis: Pancreas cancer ? ?INTERVAL HISTORY:  ? ?Ms. Lovan returns as scheduled.  She completed another treatment with gemcitabine and Abraxane on 06/30/2021.  She reports mild nausea following chemotherapy.  No pain.  Mild peripheral numbness.  This does not interfere with activity.  No rash.  No new complaint. ? ?Objective: ? ?Vital signs in last 24 hours: ? ?Blood pressure (!) 162/90, pulse 100, temperature 98.2 ?F (36.8 ?C), temperature source Oral, resp. rate 18, height '5\' 3"'$  (1.6 m), weight 127 lb (57.6 kg), SpO2 100 %. ?  ? ?HEENT: No thrush or ulcers ?Resp: Lungs clear bilaterally ?Cardio: Regular rate and rhythm ?GI: The liver is palpable in the right upper abdomen, no splenomegaly ?Vascular: No leg edema  ?Skin: Palms without erythema ?Neurologic: Mild to moderate decrease in vibratory sense at the fingertips bilaterally ? ?Portacath/PICC-without erythema ? ?Lab Results: ? ?Lab Results  ?Component Value Date  ? WBC 6.7 07/14/2021  ? HGB 11.0 (L) 07/14/2021  ? HCT 34.9 (L) 07/14/2021  ? MCV 90.6 07/14/2021  ? PLT 286 07/14/2021  ? NEUTROABS 3.1 07/14/2021  ? ? ?CMP  ?Lab Results  ?Component Value Date  ? NA 140 06/30/2021  ? K 3.8 06/30/2021  ? CL 104 06/30/2021  ? CO2 27 06/30/2021  ? GLUCOSE 190 (H) 06/30/2021  ? BUN 8 06/30/2021  ? CREATININE 0.70 06/30/2021  ? CALCIUM 10.4 (H) 06/30/2021  ? PROT 7.2 06/30/2021  ? ALBUMIN 3.8 06/30/2021  ? AST 27 06/30/2021  ? ALT 23 06/30/2021  ? ALKPHOS 278 (H) 06/30/2021  ? BILITOT 0.5 06/30/2021  ? GFRNONAA >60 06/30/2021  ? ? ?Lab Results  ?Component Value Date  ? NAT557 40,991 (H) 06/30/2021  ? ? ?Medications: I have reviewed the patient's current medications. ? ? ?Assessment/Plan: ?Metastatic pancreas cancer ?Right upper quadrant ultrasound 06/17/2020-multiple hypoechoic liver lesions ?CT abdomen/pelvis 06/17/2020- pancreas tail mass, multiple liver metastases, enlarged periaortic nodes, right ovary  metastasis?,  Nodularity at the left paracolic gutter and posterior/inferior stomach suggesting peritoneal tumor spread ?CT chest 06/17/2020- scattered small pulmonary nodules consistent with metastatic disease ?Ultrasound-guided biopsy of a left liver mass 06/18/2020- adenocarcinoma, morphology compatible with metastatic pancreatic adenocarcinoma, MSS, tumor mutation burden 0 ?Cycle 1 FOLFOX 07/01/2020 ?Cycle 2 FOLFOX 07/16/2020 ?Cycle 3 FOLFOX 07/31/2020 ?Cycle 4 FOLFIRINOX 08/13/2020 ?Cycle 5 FOLFIRINOX 09/03/2020, Udenyca ?CTs 09/13/2020-unchanged nodule medial left lower lobe; stable nodal and likely peritoneal disease in the abdomen/pelvis; unchanged pancreatic tail mass; multifocal hepatic metastases, mildly progressive ?Cycle 6 FOLFIRINOX 09/16/2020, Oxaliplatin held ?Cycle 7 FOLFIRINOX 09/30/2020 ?Cycle 8 FOLFIRINOX 10/14/2020, oxaliplatin held ?Cycle 9 FOLFIRINOX 10/28/2020, oxaliplatin held ?Cycle 10 FOLFIRINOX 11/11/2020, oxaliplatin held ?Cycle 11 FOLFIRINOX 11/25/2020, oxaliplatin held ?CTs 12/06/2020-primary pancreatic tail tumor decreased in size.  Widespread liver metastases all decreased.  Previously visualized peritoneal nodule adjacent to the distal stomach resolved.  Left lower lobe pulmonary nodule stable.  No new or progressive metastatic disease. ?Cycle 12 FOLFIRI 12/09/2020 ?Cycle 13 FOLFIRI 12/23/2020 ?Cycle 14 FOLFIRI 01/06/2021 ?Cycle 15 FOLFIRI 01/20/2021 ?Cycle 16 FOLFIRI 02/03/2021 ?Cycle 17 FOLFIRI 02/17/2021 ?CTs 02/27/2021-slight decrease in size of pancreas tail mass and hypodense liver lesions, unchanged left lower lobe nodule, enlargement of a single peritoneal nodule anterior to the distal descending colon ?Cycle 18 FOLFIRI 03/04/2021 ?Cycle 19 FOLFIRI 03/17/2021 ?Cycle 20 FOLFIRI 03/31/2021 ?Cycle 21 FOLFIRI 04/14/2021 ?CTs 04/24/2021-unchanged nodule left lower lobe.  Numerous hypodense liver lesions, a few of which are clearly increased in size.  Unchanged mass pancreatic tail. ?  Cycle 1  gemcitabine/Abraxane 05/05/2021 ?Cycle 2 gemcitabine/Abraxane 05/19/2021 ?Cycle 3 gemcitabine/Abraxane 06/02/2021 ?Cycle 4 gemcitabine/Abraxane 06/16/2021 ?Cycle 5 gemcitabine/Abraxane 06/30/2021 ?CTs 07/11/2021-enlargement of pancreas tail mass and liver metastases, stable left lower lobe nodule, stable left lower quadrant omental nodule, stable bilateral ovarian lesions-not definitely simple cysts ?2.  Pain secondary to #1-improved ?3.  Weight loss-improved ?4.  Family history of pancreas cancer ?5.  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  ? ? ? ? ?Disposition: ?Ms. Andringa has metastatic pancreas cancer.  She has been treated with gemcitabine/Abraxane since 05/05/2021.  The CA 19-9 remains elevated.  The restaging CTs are consistent with disease progression in the liver.  I reviewed the CT findings and images with Ms. Stiver and her daughter.  We discussed treatment options.  Gemcitabine/Abraxane will be discontinued. ? ?She understands systemic treatment options are limited.  I offered a referral to consider a clinical trial and for second opinion.  She does not wish to go out of town at present.  I recommend repeat treatment with FOLFIRINOX.  She understands the potential for progressive and potentially permanent neuropathy.  We also discussed the potential for an allergic reaction after repeat treatment with oxaliplatin.  She understands the chance of a clinical response with FOLFIRINOX is small. ? ?The plan is to resume treatment with FOLFIRINOX 07/21/2021.  She will return for an office visit and chemotherapy on 08/04/2021.  A chemotherapy plan was entered today. ? ?[name], MD ? ?07/14/2021  ?8:55 AM ? ? ?

## 2021-07-15 LAB — CANCER ANTIGEN 19-9: CA 19-9: 42276 U/mL — ABNORMAL HIGH (ref 0–35)

## 2021-07-18 ENCOUNTER — Other Ambulatory Visit: Payer: Self-pay

## 2021-07-18 DIAGNOSIS — C252 Malignant neoplasm of tail of pancreas: Secondary | ICD-10-CM

## 2021-07-19 ENCOUNTER — Other Ambulatory Visit: Payer: Self-pay | Admitting: Oncology

## 2021-07-21 ENCOUNTER — Inpatient Hospital Stay: Payer: Medicaid Other

## 2021-07-21 VITALS — BP 145/84 | HR 85 | Temp 97.8°F | Resp 18 | Ht 63.0 in | Wt 125.8 lb

## 2021-07-21 DIAGNOSIS — C252 Malignant neoplasm of tail of pancreas: Secondary | ICD-10-CM

## 2021-07-21 DIAGNOSIS — Z5111 Encounter for antineoplastic chemotherapy: Secondary | ICD-10-CM | POA: Diagnosis not present

## 2021-07-21 LAB — CBC WITH DIFFERENTIAL (CANCER CENTER ONLY)
Abs Immature Granulocytes: 0.01 10*3/uL (ref 0.00–0.07)
Basophils Absolute: 0.1 10*3/uL (ref 0.0–0.1)
Basophils Relative: 1 %
Eosinophils Absolute: 0.2 10*3/uL (ref 0.0–0.5)
Eosinophils Relative: 4 %
HCT: 36.4 % (ref 36.0–46.0)
Hemoglobin: 11.3 g/dL — ABNORMAL LOW (ref 12.0–15.0)
Immature Granulocytes: 0 %
Lymphocytes Relative: 35 %
Lymphs Abs: 2.3 10*3/uL (ref 0.7–4.0)
MCH: 28.3 pg (ref 26.0–34.0)
MCHC: 31 g/dL (ref 30.0–36.0)
MCV: 91.2 fL (ref 80.0–100.0)
Monocytes Absolute: 1.2 10*3/uL — ABNORMAL HIGH (ref 0.1–1.0)
Monocytes Relative: 18 %
Neutro Abs: 2.8 10*3/uL (ref 1.7–7.7)
Neutrophils Relative %: 42 %
Platelet Count: 324 10*3/uL (ref 150–400)
RBC: 3.99 MIL/uL (ref 3.87–5.11)
RDW: 14.5 % (ref 11.5–15.5)
WBC Count: 6.6 10*3/uL (ref 4.0–10.5)
nRBC: 0 % (ref 0.0–0.2)

## 2021-07-21 LAB — CMP (CANCER CENTER ONLY)
ALT: 15 U/L (ref 0–44)
AST: 23 U/L (ref 15–41)
Albumin: 3.7 g/dL (ref 3.5–5.0)
Alkaline Phosphatase: 247 U/L — ABNORMAL HIGH (ref 38–126)
Anion gap: 9 (ref 5–15)
BUN: 10 mg/dL (ref 8–23)
CO2: 29 mmol/L (ref 22–32)
Calcium: 10.2 mg/dL (ref 8.9–10.3)
Chloride: 101 mmol/L (ref 98–111)
Creatinine: 0.77 mg/dL (ref 0.44–1.00)
GFR, Estimated: >60 mL/min (ref >60)
Glucose, Bld: 91 mg/dL (ref 70–99)
Potassium: 4.2 mmol/L (ref 3.5–5.1)
Sodium: 139 mmol/L (ref 135–145)
Total Bilirubin: 0.5 mg/dL (ref 0.3–1.2)
Total Protein: 7.6 g/dL (ref 6.5–8.1)

## 2021-07-21 MED ORDER — SODIUM CHLORIDE 0.9 % IV SOLN
2400.0000 mg/m2 | INTRAVENOUS | Status: DC
  Administered 2021-07-21: 3850 mg via INTRAVENOUS
  Filled 2021-07-21: qty 77

## 2021-07-21 MED ORDER — SODIUM CHLORIDE 0.9% FLUSH
10.0000 mL | INTRAVENOUS | Status: DC | PRN
  Administered 2021-07-21: 10 mL

## 2021-07-21 MED ORDER — OXALIPLATIN CHEMO INJECTION 100 MG/20ML
85.0000 mg/m2 | Freq: Once | INTRAVENOUS | Status: AC
  Administered 2021-07-21: 135 mg via INTRAVENOUS
  Filled 2021-07-21: qty 20

## 2021-07-21 MED ORDER — SODIUM CHLORIDE 0.9 % IV SOLN
10.0000 mg | Freq: Once | INTRAVENOUS | Status: AC
  Administered 2021-07-21: 10 mg via INTRAVENOUS
  Filled 2021-07-21: qty 1

## 2021-07-21 MED ORDER — SODIUM CHLORIDE 0.9 % IV SOLN
400.0000 mg/m2 | Freq: Once | INTRAVENOUS | Status: AC
  Administered 2021-07-21: 640 mg via INTRAVENOUS
  Filled 2021-07-21: qty 32

## 2021-07-21 MED ORDER — DEXTROSE 5 % IV SOLN: Freq: Once | INTRAVENOUS | Status: AC

## 2021-07-21 MED ORDER — SODIUM CHLORIDE 0.9 % IV SOLN
150.0000 mg | Freq: Once | INTRAVENOUS | Status: AC
  Administered 2021-07-21: 150 mg via INTRAVENOUS
  Filled 2021-07-21: qty 5

## 2021-07-21 MED ORDER — PALONOSETRON HCL INJECTION 0.25 MG/5ML
0.2500 mg | Freq: Once | INTRAVENOUS | Status: AC
  Administered 2021-07-21: 0.25 mg via INTRAVENOUS
  Filled 2021-07-21: qty 5

## 2021-07-21 MED ORDER — SODIUM CHLORIDE 0.9 % IV SOLN
125.0000 mg/m2 | Freq: Once | INTRAVENOUS | Status: AC
  Administered 2021-07-21: 200 mg via INTRAVENOUS
  Filled 2021-07-21: qty 10

## 2021-07-21 MED ORDER — ATROPINE SULFATE 1 MG/ML IV SOLN
0.5000 mg | Freq: Once | INTRAVENOUS | Status: AC | PRN
  Administered 2021-07-21: 0.5 mg via INTRAVENOUS
  Filled 2021-07-21: qty 1

## 2021-07-21 NOTE — Patient Instructions (Addendum)
The chemotherapy medication bag should finish at 46 hours, 96 hours, or 7 days. For example, if your pump is scheduled for 46 hours and it was put on at 4:00 p.m., it should finish at 2:00 p.m. the day it is scheduled to come off regardless of your appointment time.     Estimated time to finish at 12:15pm on Wednesday 07/23/21.   If the display on your pump reads "Low Volume" and it is beeping, take the batteries out of the pump and come to the cancer center for it to be taken off.   If the pump alarms go off prior to the pump reading "Low Volume" then call [phone] and someone can assist you.  If the plunger comes out and the chemotherapy medication is leaking out, please use your home chemo spill kit to clean up the spill. Do NOT use paper towels or other household products.  If you have problems or questions regarding your pump, please call either [phone] (24 hours a day) or the cancer center Monday-Friday 8:00 a.m.- 4:30 p.m. at the clinic number and we will assist you. If you are unable to get assistance, then go to the nearest Emergency Department and ask the staff to contact the IV team for assistance.    [place] [facility]  Discharge Instructions: Thank you for choosing [facility] to provide your oncology and hematology care.   If you have a lab appointment with the [facility], please go directly to the [facility] and check in at the registration area.   Wear comfortable clothing and clothing appropriate for easy access to any Portacath or PICC line.   We strive to give you quality time with your provider. You may need to reschedule your appointment if you arrive late (15 or more minutes).  Arriving late affects you and other patients whose appointments are after yours.  Also, if you miss three or more appointments without notifying the office, you may be dismissed from the clinic at the provider's discretion.      For prescription  refill requests, have your pharmacy contact our office and allow 72 hours for refills to be completed.    Today you received the following chemotherapy and/or immunotherapy agents: oxaliplatin, irinotecan, leucovorin, fluorouracil.      To help prevent nausea and vomiting after your treatment, we encourage you to take your nausea medication as directed.  BELOW ARE SYMPTOMS THAT SHOULD BE REPORTED IMMEDIATELY: *FEVER GREATER THAN 100.4 F (38 C) OR HIGHER *CHILLS OR SWEATING *NAUSEA AND VOMITING THAT IS NOT CONTROLLED WITH YOUR NAUSEA MEDICATION *UNUSUAL SHORTNESS OF BREATH *UNUSUAL BRUISING OR BLEEDING *URINARY PROBLEMS (pain or burning when urinating, or frequent urination) *BOWEL PROBLEMS (unusual diarrhea, constipation, pain near the anus) TENDERNESS IN MOUTH AND THROAT WITH OR WITHOUT PRESENCE OF ULCERS (sore throat, sores in mouth, or a toothache) UNUSUAL RASH, SWELLING OR PAIN  UNUSUAL VAGINAL DISCHARGE OR ITCHING   Items with * indicate a potential emergency and should be followed up as soon as possible or go to the Emergency Department if any problems should occur.  Please show the CHEMOTHERAPY ALERT CARD or IMMUNOTHERAPY ALERT CARD at check-in to the Emergency Department and triage nurse.  Should you have questions after your visit or need to cancel or reschedule your appointment, please contact [facility]  Dept: [phone]  and follow the prompts.  Office hours are 8:00 a.m. to 4:30 p.m. Monday - Friday. Please note that  voicemails left after 4:00 p.m. may not be returned until the following business day.  We are closed weekends and major holidays. You have access to a nurse at all times for urgent questions. Please call the main number to the clinic Dept: [phone] and follow the prompts.   For any non-urgent questions, you may also contact your provider using MyChart. We now offer e-Visits for anyone 62 and older to request care online for  non-urgent symptoms. For details visit [email or web address].   Also download the MyChart app! Go to the app store, search "MyChart", open the app, select [facility], and log in with your MyChart username and password.  Due to Covid, a mask is required upon entering the hospital/clinic. If you do not have a mask, one will be given to you upon arrival. For doctor visits, patients may have 1 support person aged 34 or older with them. For treatment visits, patients cannot have anyone with them due to current Covid guidelines and our immunocompromised population.   Oxaliplatin Injection What is this medication? OXALIPLATIN (ox AL i PLA tin) is a chemotherapy drug. It targets fast dividing cells, like cancer cells, and causes these cells to die. This medicine is used to treat cancers of the colon and rectum, and many other cancers. This medicine may be used for other purposes; ask your health care provider or pharmacist if you have questions. COMMON BRAND NAME(S): Eloxatin What should I tell my care team before I take this medication? They need to know if you have any of these conditions: heart disease history of irregular heartbeat liver disease low blood counts, like white cells, platelets, or red blood cells lung or breathing disease, like asthma take medicines that treat or prevent blood clots tingling of the fingers or toes, or other nerve disorder an unusual or allergic reaction to oxaliplatin, other chemotherapy, other medicines, foods, dyes, or preservatives pregnant or trying to get pregnant breast-feeding How should I use this medication? This drug is given as an infusion into a vein. It is administered in a hospital or clinic by a specially trained health care professional. Talk to your pediatrician regarding the use of this medicine in children. Special care may be needed. Overdosage: If you think you have taken too much of this medicine contact a poison control center or emergency  room at once. NOTE: This medicine is only for you. Do not share this medicine with others. What if I miss a dose? It is important not to miss a dose. Call your doctor or health care professional if you are unable to keep an appointment. What may interact with this medication? Do not take this medicine with any of the following medications: cisapride dronedarone pimozide thioridazine This medicine may also interact with the following medications: aspirin and aspirin-like medicines certain medicines that treat or prevent blood clots like warfarin, apixaban, dabigatran, and rivaroxaban cisplatin cyclosporine diuretics medicines for infection like acyclovir, adefovir, amphotericin B, bacitracin, cidofovir, foscarnet, ganciclovir, gentamicin, pentamidine, vancomycin NSAIDs, medicines for pain and inflammation, like ibuprofen or naproxen other medicines that prolong the QT interval (an abnormal heart rhythm) pamidronate zoledronic acid This list may not describe all possible interactions. Give your health care provider a list of all the medicines, herbs, non-prescription drugs, or dietary supplements you use. Also tell them if you smoke, drink alcohol, or use illegal drugs. Some items may interact with your medicine. What should I watch for while using this medication? Your condition will be monitored carefully while you are receiving  this medicine. You may need blood work done while you are taking this medicine. This medicine may make you feel generally unwell. This is not uncommon as chemotherapy can affect healthy cells as well as cancer cells. Report any side effects. Continue your course of treatment even though you feel ill unless your healthcare professional tells you to stop. This medicine can make you more sensitive to cold. Do not drink cold drinks or use ice. Cover exposed skin before coming in contact with cold temperatures or cold objects. When out in cold weather wear warm clothing  and cover your mouth and nose to warm the air that goes into your lungs. Tell your doctor if you get sensitive to the cold. Do not become pregnant while taking this medicine or for 9 months after stopping it. Women should inform their health care professional if they wish to become pregnant or think they might be pregnant. Men should not father a child while taking this medicine and for 6 months after stopping it. There is potential for serious side effects to an unborn child. Talk to your health care professional for more information. Do not breast-feed a child while taking this medicine or for 3 months after stopping it. This medicine has caused ovarian failure in some women. This medicine may make it more difficult to get pregnant. Talk to your health care professional if you are concerned about your fertility. This medicine has caused decreased sperm counts in some men. This may make it more difficult to father a child. Talk to your health care professional if you are concerned about your fertility. This medicine may increase your risk of getting an infection. Call your health care professional for advice if you get a fever, chills, or sore throat, or other symptoms of a cold or flu. Do not treat yourself. Try to avoid being around people who are sick. Avoid taking medicines that contain aspirin, acetaminophen, ibuprofen, naproxen, or ketoprofen unless instructed by your health care professional. These medicines may hide a fever. Be careful brushing or flossing your teeth or using a toothpick because you may get an infection or bleed more easily. If you have any dental work done, tell your dentist you are receiving this medicine. What side effects may I notice from receiving this medication? Side effects that you should report to your doctor or health care professional as soon as possible: allergic reactions like skin rash, itching or hives, swelling of the face, lips, or tongue breathing  problems cough low blood counts - this medicine may decrease the number of white blood cells, red blood cells, and platelets. You may be at increased risk for infections and bleeding nausea, vomiting pain, redness, or irritation at site where injected pain, tingling, numbness in the hands or feet signs and symptoms of bleeding such as bloody or black, tarry stools; red or dark brown urine; spitting up blood or brown material that looks like coffee grounds; red spots on the skin; unusual bruising or bleeding from the eyes, gums, or nose signs and symptoms of a dangerous change in heartbeat or heart rhythm like chest pain; dizziness; fast, irregular heartbeat; palpitations; feeling faint or lightheaded; falls signs and symptoms of infection like fever; chills; cough; sore throat; pain or trouble passing urine signs and symptoms of liver injury like dark yellow or brown urine; general ill feeling or flu-like symptoms; light-colored stools; loss of appetite; nausea; right upper belly pain; unusually weak or tired; yellowing of the eyes or skin signs and symptoms  of low red blood cells or anemia such as unusually weak or tired; feeling faint or lightheaded; falls signs and symptoms of muscle injury like dark urine; trouble passing urine or change in the amount of urine; unusually weak or tired; muscle pain; back pain Side effects that usually do not require medical attention (report to your doctor or health care professional if they continue or are bothersome): changes in taste diarrhea gas hair loss loss of appetite mouth sores This list may not describe all possible side effects. Call your doctor for medical advice about side effects. You may report side effects to FDA at 1-800-FDA-1088. Where should I keep my medication? This drug is given in a hospital or clinic and will not be stored at home. NOTE: This sheet is a summary. It may not cover all possible information. If you have questions about  this medicine, talk to your doctor, pharmacist, or health care provider.  2023 Elsevier/Gold Standard (2021-01-17 00:00:00)  Irinotecan injection What is this medication? IRINOTECAN (ir in oh TEE kan ) is a chemotherapy drug. It is used to treat colon and rectal cancer. This medicine may be used for other purposes; ask your health care provider or pharmacist if you have questions. COMMON BRAND NAME(S): Camptosar What should I tell my care team before I take this medication? They need to know if you have any of these conditions: dehydration diarrhea infection (especially a virus infection such as chickenpox, cold sores, or herpes) liver disease low blood counts, like low white cell, platelet, or red cell counts low levels of calcium, magnesium, or potassium in the blood recent or ongoing radiation therapy an unusual or allergic reaction to irinotecan, other medicines, foods, dyes, or preservatives pregnant or trying to get pregnant breast-feeding How should I use this medication? This drug is given as an infusion into a vein. It is administered in a hospital or clinic by a specially trained health care professional. Talk to your pediatrician regarding the use of this medicine in children. Special care may be needed. Overdosage: If you think you have taken too much of this medicine contact a poison control center or emergency room at once. NOTE: This medicine is only for you. Do not share this medicine with others. What if I miss a dose? It is important not to miss your dose. Call your doctor or health care professional if you are unable to keep an appointment. What may interact with this medication? Do not take this medicine with any of the following medications: cobicistat itraconazole This medicine may interact with the following medications: antiviral medicines for HIV or AIDS certain antibiotics like rifampin or rifabutin certain medicines for fungal infections like  ketoconazole, posaconazole, and voriconazole certain medicines for seizures like carbamazepine, phenobarbital, phenotoin clarithromycin gemfibrozil nefazodone St. John's Wort This list may not describe all possible interactions. Give your health care provider a list of all the medicines, herbs, non-prescription drugs, or dietary supplements you use. Also tell them if you smoke, drink alcohol, or use illegal drugs. Some items may interact with your medicine. What should I watch for while using this medication? Your condition will be monitored carefully while you are receiving this medicine. You will need important blood work done while you are taking this medicine. This drug may make you feel generally unwell. This is not uncommon, as chemotherapy can affect healthy cells as well as cancer cells. Report any side effects. Continue your course of treatment even though you feel ill unless your doctor tells you  to stop. In some cases, you may be given additional medicines to help with side effects. Follow all directions for their use. You may get drowsy or dizzy. Do not drive, use machinery, or do anything that needs mental alertness until you know how this medicine affects you. Do not stand or sit up quickly, especially if you are an older patient. This reduces the risk of dizzy or fainting spells. Call your health care professional for advice if you get a fever, chills, or sore throat, or other symptoms of a cold or flu. Do not treat yourself. This medicine decreases your body's ability to fight infections. Try to avoid being around people who are sick. Avoid taking products that contain aspirin, acetaminophen, ibuprofen, naproxen, or ketoprofen unless instructed by your doctor. These medicines may hide a fever. This medicine may increase your risk to bruise or bleed. Call your doctor or health care professional if you notice any unusual bleeding. Be careful brushing and flossing your teeth or using a  toothpick because you may get an infection or bleed more easily. If you have any dental work done, tell your dentist you are receiving this medicine. Do not become pregnant while taking this medicine or for 6 months after stopping it. Women should inform their health care professional if they wish to become pregnant or think they might be pregnant. Men should not father a child while taking this medicine and for 3 months after stopping it. There is potential for serious side effects to an unborn child. Talk to your health care professional for more information. Do not breast-feed an infant while taking this medicine or for 7 days after stopping it. This medicine has caused ovarian failure in some women. This medicine may make it more difficult to get pregnant. Talk to your health care professional if you are concerned about your fertility. This medicine has caused decreased sperm counts in some men. This may make it more difficult to father a child. Talk to your health care professional if you are concerned about your fertility. What side effects may I notice from receiving this medication? Side effects that you should report to your doctor or health care professional as soon as possible: allergic reactions like skin rash, itching or hives, swelling of the face, lips, or tongue chest pain diarrhea flushing, runny nose, sweating during infusion low blood counts - this medicine may decrease the number of white blood cells, red blood cells and platelets. You may be at increased risk for infections and bleeding. nausea, vomiting pain, swelling, warmth in the leg signs of decreased platelets or bleeding - bruising, pinpoint red spots on the skin, black, tarry stools, blood in the urine signs of infection - fever or chills, cough, sore throat, pain or difficulty passing urine signs of decreased red blood cells - unusually weak or tired, fainting spells, lightheadedness Side effects that usually do not  require medical attention (report to your doctor or health care professional if they continue or are bothersome): constipation hair loss headache loss of appetite mouth sores stomach pain This list may not describe all possible side effects. Call your doctor for medical advice about side effects. You may report side effects to FDA at 1-800-FDA-1088. Where should I keep my medication? This drug is given in a hospital or clinic and will not be stored at home. NOTE: This sheet is a summary. It may not cover all possible information. If you have questions about this medicine, talk to your doctor, pharmacist, or health  care provider.  2023 Elsevier/Gold Standard (2021-01-17 00:00:00)  Leucovorin injection What is this medication? LEUCOVORIN (loo koe VOR in) is used to prevent or treat the harmful effects of some medicines. This medicine is used to treat anemia caused by a low amount of folic acid in the body. It is also used with 5-fluorouracil (5-FU) to treat colon cancer. This medicine may be used for other purposes; ask your health care provider or pharmacist if you have questions. What should I tell my care team before I take this medication? They need to know if you have any of these conditions: anemia from low levels of vitamin B-12 in the blood an unusual or allergic reaction to leucovorin, folic acid, other medicines, foods, dyes, or preservatives pregnant or trying to get pregnant breast-feeding How should I use this medication? This medicine is for injection into a muscle or into a vein. It is given by a health care professional in a hospital or clinic setting. Talk to your pediatrician regarding the use of this medicine in children. Special care may be needed. Overdosage: If you think you have taken too much of this medicine contact a poison control center or emergency room at once. NOTE: This medicine is only for you. Do not share this medicine with others. What if I miss a  dose? This does not apply. What may interact with this medication? capecitabine fluorouracil phenobarbital phenytoin primidone trimethoprim-sulfamethoxazole This list may not describe all possible interactions. Give your health care provider a list of all the medicines, herbs, non-prescription drugs, or dietary supplements you use. Also tell them if you smoke, drink alcohol, or use illegal drugs. Some items may interact with your medicine. What should I watch for while using this medication? Your condition will be monitored carefully while you are receiving this medicine. This medicine may increase the side effects of 5-fluorouracil, 5-FU. Tell your doctor or health care professional if you have diarrhea or mouth sores that do not get better or that get worse. What side effects may I notice from receiving this medication? Side effects that you should report to your doctor or health care professional as soon as possible: allergic reactions like skin rash, itching or hives, swelling of the face, lips, or tongue breathing problems fever, infection mouth sores unusual bleeding or bruising unusually weak or tired Side effects that usually do not require medical attention (report to your doctor or health care professional if they continue or are bothersome): constipation or diarrhea loss of appetite nausea, vomiting This list may not describe all possible side effects. Call your doctor for medical advice about side effects. You may report side effects to FDA at 1-800-FDA-1088. Where should I keep my medication? This drug is given in a hospital or clinic and will not be stored at home. NOTE: This sheet is a summary. It may not cover all possible information. If you have questions about this medicine, talk to your doctor, pharmacist, or health care provider.  2023 Elsevier/Gold Standard (2007-08-25 00:00:00)  Fluorouracil, 5-FU injection What is this medication? FLUOROURACIL, 5-FU (flure oh  YOOR a sil) is a chemotherapy drug. It slows the growth of cancer cells. This medicine is used to treat many types of cancer like breast cancer, colon or rectal cancer, pancreatic cancer, and stomach cancer. This medicine may be used for other purposes; ask your health care provider or pharmacist if you have questions. COMMON BRAND NAME(S): Adrucil What should I tell my care team before I take this medication? They need  to know if you have any of these conditions: blood disorders dihydropyrimidine dehydrogenase (DPD) deficiency infection (especially a virus infection such as chickenpox, cold sores, or herpes) kidney disease liver disease malnourished, poor nutrition recent or ongoing radiation therapy an unusual or allergic reaction to fluorouracil, other chemotherapy, other medicines, foods, dyes, or preservatives pregnant or trying to get pregnant breast-feeding How should I use this medication? This drug is given as an infusion or injection into a vein. It is administered in a hospital or clinic by a specially trained health care professional. Talk to your pediatrician regarding the use of this medicine in children. Special care may be needed. Overdosage: If you think you have taken too much of this medicine contact a poison control center or emergency room at once. NOTE: This medicine is only for you. Do not share this medicine with others. What if I miss a dose? It is important not to miss your dose. Call your doctor or health care professional if you are unable to keep an appointment. What may interact with this medication? Do not take this medicine with any of the following medications: live virus vaccines This medicine may also interact with the following medications: medicines that treat or prevent blood clots like warfarin, enoxaparin, and dalteparin This list may not describe all possible interactions. Give your health care provider a list of all the medicines, herbs,  non-prescription drugs, or dietary supplements you use. Also tell them if you smoke, drink alcohol, or use illegal drugs. Some items may interact with your medicine. What should I watch for while using this medication? Visit your doctor for checks on your progress. This drug may make you feel generally unwell. This is not uncommon, as chemotherapy can affect healthy cells as well as cancer cells. Report any side effects. Continue your course of treatment even though you feel ill unless your doctor tells you to stop. In some cases, you may be given additional medicines to help with side effects. Follow all directions for their use. Call your doctor or health care professional for advice if you get a fever, chills or sore throat, or other symptoms of a cold or flu. Do not treat yourself. This drug decreases your body's ability to fight infections. Try to avoid being around people who are sick. This medicine may increase your risk to bruise or bleed. Call your doctor or health care professional if you notice any unusual bleeding. Be careful brushing and flossing your teeth or using a toothpick because you may get an infection or bleed more easily. If you have any dental work done, tell your dentist you are receiving this medicine. Avoid taking products that contain aspirin, acetaminophen, ibuprofen, naproxen, or ketoprofen unless instructed by your doctor. These medicines may hide a fever. Do not become pregnant while taking this medicine. Women should inform their doctor if they wish to become pregnant or think they might be pregnant. There is a potential for serious side effects to an unborn child. Talk to your health care professional or pharmacist for more information. Do not breast-feed an infant while taking this medicine. Men should inform their doctor if they wish to father a child. This medicine may lower sperm counts. Do not treat diarrhea with over the counter products. Contact your doctor if you  have diarrhea that lasts more than 2 days or if it is severe and watery. This medicine can make you more sensitive to the sun. Keep out of the sun. If you cannot avoid being in  the sun, wear protective clothing and use sunscreen. Do not use sun lamps or tanning beds/booths. What side effects may I notice from receiving this medication? Side effects that you should report to your doctor or health care professional as soon as possible: allergic reactions like skin rash, itching or hives, swelling of the face, lips, or tongue low blood counts - this medicine may decrease the number of white blood cells, red blood cells and platelets. You may be at increased risk for infections and bleeding. signs of infection - fever or chills, cough, sore throat, pain or difficulty passing urine signs of decreased platelets or bleeding - bruising, pinpoint red spots on the skin, black, tarry stools, blood in the urine signs of decreased red blood cells - unusually weak or tired, fainting spells, lightheadedness breathing problems changes in vision chest pain mouth sores nausea and vomiting pain, swelling, redness at site where injected pain, tingling, numbness in the hands or feet redness, swelling, or sores on hands or feet stomach pain unusual bleeding Side effects that usually do not require medical attention (report to your doctor or health care professional if they continue or are bothersome): changes in finger or toe nails diarrhea dry or itchy skin hair loss headache loss of appetite sensitivity of eyes to the light stomach upset unusually teary eyes This list may not describe all possible side effects. Call your doctor for medical advice about side effects. You may report side effects to FDA at 1-800-FDA-1088. Where should I keep my medication? This drug is given in a hospital or clinic and will not be stored at home. NOTE: This sheet is a summary. It may not cover all possible information. If  you have questions about this medicine, talk to your doctor, pharmacist, or health care provider.  2023 [employer] Standard (2021-01-17 00:00:00)

## 2021-07-21 NOTE — Progress Notes (Signed)
Patient presents for treatment. RN assessment completed along with the following:  Labs/vitals reviewed - Yes, and within treatment parameters.   Weight within 10% of previous measurement - Yes Informed consent completed and reflects current therapy/intent - Yes, on date 07/01/20             Provider progress note reviewed - Patient not seen by provider today. Most recent note dated 07/14/21 reviewed. Treatment/Antibody/Supportive plan reviewed - Yes, and there are no adjustments needed for today's treatment. S&H and other orders reviewed - Yes, and there are no additional orders identified. Previous treatment date reviewed - Yes, and the appropriate amount of time has elapsed between treatments.  Patient to proceed with treatment.

## 2021-07-21 NOTE — Patient Instructions (Signed)

## 2021-07-22 LAB — CANCER ANTIGEN 19-9: CA 19-9: 51594 U/mL — ABNORMAL HIGH (ref 0–35)

## 2021-07-23 ENCOUNTER — Inpatient Hospital Stay: Payer: Medicaid Other

## 2021-07-23 VITALS — BP 157/85 | HR 81 | Temp 98.2°F | Resp 18

## 2021-07-23 DIAGNOSIS — C252 Malignant neoplasm of tail of pancreas: Secondary | ICD-10-CM

## 2021-07-23 DIAGNOSIS — Z5111 Encounter for antineoplastic chemotherapy: Secondary | ICD-10-CM | POA: Diagnosis not present

## 2021-07-23 MED ORDER — SODIUM CHLORIDE 0.9% FLUSH
10.0000 mL | INTRAVENOUS | Status: DC | PRN
  Administered 2021-07-23: 10 mL

## 2021-07-23 MED ORDER — HEPARIN SOD (PORK) LOCK FLUSH 100 UNIT/ML IV SOLN
500.0000 [IU] | Freq: Once | INTRAVENOUS | Status: AC | PRN
  Administered 2021-07-23: 500 [IU]

## 2021-07-23 NOTE — Patient Instructions (Signed)

## 2021-07-23 NOTE — Progress Notes (Signed)
Kirsten [name], notified that Pt here for pump stop. Doing fine today but the evening of her last appointment (returned to oxaliplatin, irinotecan, leucovorin, fluorouracil) felt like her tongue "was lazy". Difficulty swallowing, drinking for about 3 hours.  Reply from Manuela Schwartz is that this is common with oxaliplatin.  Pt told to discuss with doctor on next visit.

## 2021-08-03 ENCOUNTER — Other Ambulatory Visit: Payer: Self-pay | Admitting: Oncology

## 2021-08-04 ENCOUNTER — Inpatient Hospital Stay: Payer: Medicaid Other

## 2021-08-04 ENCOUNTER — Inpatient Hospital Stay: Payer: Medicaid Other | Attending: Oncology | Admitting: Nurse Practitioner

## 2021-08-04 ENCOUNTER — Encounter: Payer: Self-pay | Admitting: Nurse Practitioner

## 2021-08-04 ENCOUNTER — Encounter: Payer: Self-pay | Admitting: *Deleted

## 2021-08-04 VITALS — BP 169/90 | HR 100 | Temp 98.2°F | Resp 18 | Ht 63.0 in | Wt 123.8 lb

## 2021-08-04 DIAGNOSIS — C252 Malignant neoplasm of tail of pancreas: Secondary | ICD-10-CM | POA: Insufficient documentation

## 2021-08-04 DIAGNOSIS — Z5111 Encounter for antineoplastic chemotherapy: Secondary | ICD-10-CM | POA: Diagnosis present

## 2021-08-04 DIAGNOSIS — D701 Agranulocytosis secondary to cancer chemotherapy: Secondary | ICD-10-CM | POA: Diagnosis not present

## 2021-08-04 DIAGNOSIS — C259 Malignant neoplasm of pancreas, unspecified: Secondary | ICD-10-CM | POA: Diagnosis not present

## 2021-08-04 DIAGNOSIS — C787 Secondary malignant neoplasm of liver and intrahepatic bile duct: Secondary | ICD-10-CM | POA: Diagnosis not present

## 2021-08-04 LAB — CMP (CANCER CENTER ONLY)
ALT: 21 U/L (ref 0–44)
AST: 21 U/L (ref 15–41)
Albumin: 3.5 g/dL (ref 3.5–5.0)
Alkaline Phosphatase: 293 U/L — ABNORMAL HIGH (ref 38–126)
Anion gap: 9 (ref 5–15)
BUN: 7 mg/dL — ABNORMAL LOW (ref 8–23)
CO2: 29 mmol/L (ref 22–32)
Calcium: 10.4 mg/dL — ABNORMAL HIGH (ref 8.9–10.3)
Chloride: 100 mmol/L (ref 98–111)
Creatinine: 0.69 mg/dL (ref 0.44–1.00)
GFR, Estimated: >60 mL/min (ref >60)
Glucose, Bld: 130 mg/dL — ABNORMAL HIGH (ref 70–99)
Potassium: 4.2 mmol/L (ref 3.5–5.1)
Sodium: 138 mmol/L (ref 135–145)
Total Bilirubin: 0.4 mg/dL (ref 0.3–1.2)
Total Protein: 7.4 g/dL (ref 6.5–8.1)

## 2021-08-04 LAB — CBC WITH DIFFERENTIAL (CANCER CENTER ONLY)
Abs Immature Granulocytes: 0.01 10*3/uL (ref 0.00–0.07)
Basophils Absolute: 0.1 10*3/uL (ref 0.0–0.1)
Basophils Relative: 1 %
Eosinophils Absolute: 0.2 10*3/uL (ref 0.0–0.5)
Eosinophils Relative: 3 %
HCT: 34.8 % — ABNORMAL LOW (ref 36.0–46.0)
Hemoglobin: 11 g/dL — ABNORMAL LOW (ref 12.0–15.0)
Immature Granulocytes: 0 %
Lymphocytes Relative: 47 %
Lymphs Abs: 2.9 10*3/uL (ref 0.7–4.0)
MCH: 28.1 pg (ref 26.0–34.0)
MCHC: 31.6 g/dL (ref 30.0–36.0)
MCV: 88.8 fL (ref 80.0–100.0)
Monocytes Absolute: 0.8 10*3/uL (ref 0.1–1.0)
Monocytes Relative: 12 %
Neutro Abs: 2.3 10*3/uL (ref 1.7–7.7)
Neutrophils Relative %: 37 %
Platelet Count: 255 10*3/uL (ref 150–400)
RBC: 3.92 MIL/uL (ref 3.87–5.11)
RDW: 14.4 % (ref 11.5–15.5)
WBC Count: 6.2 10*3/uL (ref 4.0–10.5)
nRBC: 0 % (ref 0.0–0.2)

## 2021-08-04 MED ORDER — HYDROCODONE-ACETAMINOPHEN 10-325 MG PO TABS: 1.0000 | ORAL_TABLET | Freq: Four times a day (QID) | ORAL | 0 refills | Status: DC | PRN

## 2021-08-04 NOTE — Progress Notes (Signed)
Patient seen by Ned Card NP today  Vitals are within treatment parameters.  Labs reviewed by Ned Card NP and are within treatment parameters.  Per physician team, patient is ready for treatment and there are NO modifications to the treatment plan. OK for treatment on 08/05/21. Note this is re-treatment with Oxaliplatin with risk reaction.

## 2021-08-04 NOTE — Progress Notes (Signed)
[facility] OFFICE PROGRESS NOTE   Diagnosis: Pancreas cancer  INTERVAL HISTORY:   Kirsten [name] returns as scheduled.  She completed a cycle of FOLFIRINOX 07/21/2021.  She denies significant nausea.  She noted a mild "burning" sensation at the upper abdomen for a few days.  No mouth sores.  No diarrhea.  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  Tongue felt "lazy" for a day.  Occasional pain at the right upper abdomen.  Objective:  Vital signs in last 24 hours:  Blood pressure (!) 169/90, pulse 100, temperature 98.2 F (36.8 C), temperature source Oral, resp. rate 18, height '5\' 3"'$  (1.6 m), weight 123 lb 12.8 oz (56.2 kg), SpO2 100 %.    HEENT: No thrush or ulcers. Resp: Lungs clear bilaterally. Cardio: Regular rate and rhythm. GI: Liver palpable right upper abdomen. Vascular: No leg edema. Port-A-Cath without erythema.  Lab Results:  Lab Results  Component Value Date   WBC 6.2 08/04/2021   HGB 11.0 (L) 08/04/2021   HCT 34.8 (L) 08/04/2021   MCV 88.8 08/04/2021   PLT 255 08/04/2021   NEUTROABS 2.3 08/04/2021    Imaging:  No results found.  Medications: I have reviewed the patient's current medications.  Assessment/Plan: Metastatic pancreas cancer Right upper quadrant ultrasound 06/17/2020-multiple hypoechoic liver lesions CT abdomen/pelvis 06/17/2020- pancreas tail mass, multiple liver metastases, enlarged periaortic nodes, right ovary metastasis?,  Nodularity at the left paracolic gutter and posterior/inferior stomach suggesting peritoneal tumor spread CT chest 06/17/2020- scattered small pulmonary nodules consistent with metastatic disease Ultrasound-guided biopsy of a left liver mass 06/18/2020- adenocarcinoma, morphology compatible with metastatic pancreatic adenocarcinoma, MSS, tumor mutation burden 0 Cycle 1 FOLFOX 07/01/2020 Cycle 2 FOLFOX 07/16/2020 Cycle 3 FOLFOX 07/31/2020 Cycle 4 FOLFIRINOX 08/13/2020 Cycle 5 FOLFIRINOX 09/03/2020,  Udenyca CTs 09/13/2020-unchanged nodule medial left lower lobe; stable nodal and likely peritoneal disease in the abdomen/pelvis; unchanged pancreatic tail mass; multifocal hepatic metastases, mildly progressive Cycle 6 FOLFIRINOX 09/16/2020, Oxaliplatin held Cycle 7 FOLFIRINOX 09/30/2020 Cycle 8 FOLFIRINOX 10/14/2020, oxaliplatin held Cycle 9 FOLFIRINOX 10/28/2020, oxaliplatin held Cycle 10 FOLFIRINOX 11/11/2020, oxaliplatin held Cycle 11 FOLFIRINOX 11/25/2020, oxaliplatin held CTs 12/06/2020-primary pancreatic tail tumor decreased in size.  Widespread liver metastases all decreased.  Previously visualized peritoneal nodule adjacent to the distal stomach resolved.  Left lower lobe pulmonary nodule stable.  No new or progressive metastatic disease. Cycle 12 FOLFIRI 12/09/2020 Cycle 13 FOLFIRI 12/23/2020 Cycle 14 FOLFIRI 01/06/2021 Cycle 15 FOLFIRI 01/20/2021 Cycle 16 FOLFIRI 02/03/2021 Cycle 17 FOLFIRI 02/17/2021 CTs 02/27/2021-slight decrease in size of pancreas tail mass and hypodense liver lesions, unchanged left lower lobe nodule, enlargement of a single peritoneal nodule anterior to the distal descending colon Cycle 18 FOLFIRI 03/04/2021 Cycle 19 FOLFIRI 03/17/2021 Cycle 20 FOLFIRI 03/31/2021 Cycle 21 FOLFIRI 04/14/2021 CTs 04/24/2021-unchanged nodule left lower lobe.  Numerous hypodense liver lesions, a few of which are clearly increased in size.  Unchanged mass pancreatic tail. Cycle 1 gemcitabine/Abraxane 05/05/2021 Cycle 2 gemcitabine/Abraxane 05/19/2021 Cycle 3 gemcitabine/Abraxane 06/02/2021 Cycle 4 gemcitabine/Abraxane 06/16/2021 Cycle 5 gemcitabine/Abraxane 06/30/2021 CTs 07/11/2021-enlargement of pancreas tail mass and liver metastases, stable left lower lobe nodule, stable left lower quadrant omental nodule, stable bilateral ovarian lesions-not definitely simple cysts Cycle 1 FOLFIRINOX 07/21/2021 Cycle 2 FOLFIRINOX 08/05/2021 2.  Pain secondary to #1-improved 3.  Weight loss-improved 4.  Family  history of pancreas cancer 5.  Neutropenia secondary chemotherapy-G-CSF added with cycle 5 FOLFIRINOX 6.  Oxaliplatin neuropathy-oxaliplatin held 09/16/2020, 10/14/2020, 10/28/2020, 11/11/2020, 11/25/2020    Disposition: Kirsten [name] appears  stable.  She tolerated the first cycle of FOLFIRINOX without significant acute toxicity.  Plan to proceed with cycle 2 as scheduled 08/05/2021.  CBC and chemistry panel reviewed.  Labs adequate to proceed as above.  She will return for lab, follow-up, cycle 3 FOLFIRINOX in 2 weeks.  We are available to see her sooner if needed.    Ned Card ANP/GNP-BC   08/04/2021  11:29 AM

## 2021-08-05 ENCOUNTER — Inpatient Hospital Stay: Payer: Medicaid Other

## 2021-08-05 VITALS — BP 145/78 | HR 90 | Temp 98.2°F | Resp 18

## 2021-08-05 DIAGNOSIS — C252 Malignant neoplasm of tail of pancreas: Secondary | ICD-10-CM

## 2021-08-05 DIAGNOSIS — Z5111 Encounter for antineoplastic chemotherapy: Secondary | ICD-10-CM | POA: Diagnosis not present

## 2021-08-05 MED ORDER — SODIUM CHLORIDE 0.9 % IV SOLN
2400.0000 mg/m2 | INTRAVENOUS | Status: DC
  Administered 2021-08-05: 3850 mg via INTRAVENOUS
  Filled 2021-08-05: qty 77

## 2021-08-05 MED ORDER — SODIUM CHLORIDE 0.9 % IV SOLN
10.0000 mg | Freq: Once | INTRAVENOUS | Status: AC
  Administered 2021-08-05: 10 mg via INTRAVENOUS
  Filled 2021-08-05: qty 1

## 2021-08-05 MED ORDER — ATROPINE SULFATE 1 MG/ML IV SOLN
0.5000 mg | Freq: Once | INTRAVENOUS | Status: AC | PRN
  Administered 2021-08-05: 0.5 mg via INTRAVENOUS
  Filled 2021-08-05: qty 1

## 2021-08-05 MED ORDER — PALONOSETRON HCL INJECTION 0.25 MG/5ML
0.2500 mg | Freq: Once | INTRAVENOUS | Status: AC
  Administered 2021-08-05: 0.25 mg via INTRAVENOUS
  Filled 2021-08-05: qty 5

## 2021-08-05 MED ORDER — DEXTROSE 5 % IV SOLN: Freq: Once | INTRAVENOUS | Status: AC

## 2021-08-05 MED ORDER — OXALIPLATIN CHEMO INJECTION 100 MG/20ML
85.0000 mg/m2 | Freq: Once | INTRAVENOUS | Status: AC
  Administered 2021-08-05: 135 mg via INTRAVENOUS
  Filled 2021-08-05: qty 20

## 2021-08-05 MED ORDER — SODIUM CHLORIDE 0.9 % IV SOLN
125.0000 mg/m2 | Freq: Once | INTRAVENOUS | Status: AC
  Administered 2021-08-05: 200 mg via INTRAVENOUS
  Filled 2021-08-05: qty 10

## 2021-08-05 MED ORDER — SODIUM CHLORIDE 0.9 % IV SOLN
400.0000 mg/m2 | Freq: Once | INTRAVENOUS | Status: AC
  Administered 2021-08-05: 640 mg via INTRAVENOUS
  Filled 2021-08-05: qty 32

## 2021-08-05 MED ORDER — SODIUM CHLORIDE 0.9 % IV SOLN
150.0000 mg | Freq: Once | INTRAVENOUS | Status: AC
  Administered 2021-08-05: 150 mg via INTRAVENOUS
  Filled 2021-08-05: qty 5

## 2021-08-05 NOTE — Patient Instructions (Addendum)
Kirsten [name]   The chemotherapy medication bag should finish at 46 hours, 96 hours, or 7 days. For example, if your pump is scheduled for 46 hours and it was put on at 4:00 p.m., it should finish at 2:00 p.m. the day it is scheduled to come off regardless of your appointment time.     Estimated time to finish at 12:45 Thursday,  August 07, 2021.   If the display on your pump reads "Low Volume" and it is beeping, take the batteries out of the pump and come to the cancer center for it to be taken off.   If the pump alarms go off prior to the pump reading "Low Volume" then call [phone] and someone can assist you.  If the plunger comes out and the chemotherapy medication is leaking out, please use your home chemo spill kit to clean up the spill. Do NOT use paper towels or other household products.  If you have problems or questions regarding your pump, please call either [phone] (24 hours a day) or the cancer center Monday-Friday 8:00 a.m.- 4:30 p.m. at the clinic number and we will assist you. If you are unable to get assistance, then go to the nearest Emergency Department and ask the staff to contact the IV team for assistance.  Discharge Instructions: Thank you for choosing [facility] to provide your oncology and hematology care.   If you have a lab appointment with the [facility], please go directly to the [facility] and check in at the registration area.   Wear comfortable clothing and clothing appropriate for easy access to any Portacath or PICC line.   We strive to give you quality time with your provider. You may need to reschedule your appointment if you arrive late (15 or more minutes).  Arriving late affects you and other patients whose appointments are after yours.  Also, if you miss three or more appointments without notifying the office, you may be dismissed from the clinic at the provider's discretion.      For prescription  refill requests, have your pharmacy contact our office and allow 72 hours for refills to be completed.    Today you received the following chemotherapy and/or immunotherapy agents Oxaliplatin, Leucovorin, Irinotecan, Fluorouracil.      To help prevent nausea and vomiting after your treatment, we encourage you to take your nausea medication as directed.  BELOW ARE SYMPTOMS THAT SHOULD BE REPORTED IMMEDIATELY: *FEVER GREATER THAN 100.4 F (38 C) OR HIGHER *CHILLS OR SWEATING *NAUSEA AND VOMITING THAT IS NOT CONTROLLED WITH YOUR NAUSEA MEDICATION *UNUSUAL SHORTNESS OF BREATH *UNUSUAL BRUISING OR BLEEDING *URINARY PROBLEMS (pain or burning when urinating, or frequent urination) *BOWEL PROBLEMS (unusual diarrhea, constipation, pain near the anus) TENDERNESS IN MOUTH AND THROAT WITH OR WITHOUT PRESENCE OF ULCERS (sore throat, sores in mouth, or a toothache) UNUSUAL RASH, SWELLING OR PAIN  UNUSUAL VAGINAL DISCHARGE OR ITCHING   Items with * indicate a potential emergency and should be followed up as soon as possible or go to the Emergency Department if any problems should occur.  Please show the CHEMOTHERAPY ALERT CARD or IMMUNOTHERAPY ALERT CARD at check-in to the Emergency Department and triage nurse.  Should you have questions after your visit or need to cancel or reschedule your appointment, please contact [facility]  Dept: [phone]  and follow the prompts.  Office hours are 8:00 a.m. to 4:30 p.m. Monday - Friday. Please note  that voicemails left after 4:00 p.m. may not be returned until the following business day.  We are closed weekends and major holidays. You have access to a nurse at all times for urgent questions. Please call the main number to the clinic Dept: [phone] and follow the prompts.   For any non-urgent questions, you may also contact your provider using MyChart. We now offer e-Visits for anyone 36 and older to request care online for  non-urgent symptoms. For details visit [email or web address].   Also download the MyChart app! Go to the app store, search "MyChart", open the app, select [facility], and log in with your MyChart username and password.  Due to Covid, a mask is required upon entering the hospital/clinic. If you do not have a mask, one will be given to you upon arrival. For doctor visits, patients may have 1 support person aged 14 or older with them. For treatment visits, patients cannot have anyone with them due to current Covid guidelines and our immunocompromised population.   Oxaliplatin Injection What is this medication? OXALIPLATIN (ox AL i PLA tin) is a chemotherapy drug. It targets fast dividing cells, like cancer cells, and causes these cells to die. This medicine is used to treat cancers of the colon and rectum, and many other cancers. This medicine may be used for other purposes; ask your health care provider or pharmacist if you have questions. COMMON BRAND NAME(S): Eloxatin What should I tell my care team before I take this medication? They need to know if you have any of these conditions: heart disease history of irregular heartbeat liver disease low blood counts, like white cells, platelets, or red blood cells lung or breathing disease, like asthma take medicines that treat or prevent blood clots tingling of the fingers or toes, or other nerve disorder an unusual or allergic reaction to oxaliplatin, other chemotherapy, other medicines, foods, dyes, or preservatives pregnant or trying to get pregnant breast-feeding How should I use this medication? This drug is given as an infusion into a vein. It is administered in a hospital or clinic by a specially trained health care professional. Talk to your pediatrician regarding the use of this medicine in children. Special care may be needed. Overdosage: If you think you have taken too much of this medicine contact a poison control center or emergency  room at once. NOTE: This medicine is only for you. Do not share this medicine with others. What if I miss a dose? It is important not to miss a dose. Call your doctor or health care professional if you are unable to keep an appointment. What may interact with this medication? Do not take this medicine with any of the following medications: cisapride dronedarone pimozide thioridazine This medicine may also interact with the following medications: aspirin and aspirin-like medicines certain medicines that treat or prevent blood clots like warfarin, apixaban, dabigatran, and rivaroxaban cisplatin cyclosporine diuretics medicines for infection like acyclovir, adefovir, amphotericin B, bacitracin, cidofovir, foscarnet, ganciclovir, gentamicin, pentamidine, vancomycin NSAIDs, medicines for pain and inflammation, like ibuprofen or naproxen other medicines that prolong the QT interval (an abnormal heart rhythm) pamidronate zoledronic acid This list may not describe all possible interactions. Give your health care provider a list of all the medicines, herbs, non-prescription drugs, or dietary supplements you use. Also tell them if you smoke, drink alcohol, or use illegal drugs. Some items may interact with your medicine. What should I watch for while using this medication? Your condition will be monitored carefully while you are  receiving this medicine. You may need blood work done while you are taking this medicine. This medicine may make you feel generally unwell. This is not uncommon as chemotherapy can affect healthy cells as well as cancer cells. Report any side effects. Continue your course of treatment even though you feel ill unless your healthcare professional tells you to stop. This medicine can make you more sensitive to cold. Do not drink cold drinks or use ice. Cover exposed skin before coming in contact with cold temperatures or cold objects. When out in cold weather wear warm clothing  and cover your mouth and nose to warm the air that goes into your lungs. Tell your doctor if you get sensitive to the cold. Do not become pregnant while taking this medicine or for 9 months after stopping it. Women should inform their health care professional if they wish to become pregnant or think they might be pregnant. Men should not father a child while taking this medicine and for 6 months after stopping it. There is potential for serious side effects to an unborn child. Talk to your health care professional for more information. Do not breast-feed a child while taking this medicine or for 3 months after stopping it. This medicine has caused ovarian failure in some women. This medicine may make it more difficult to get pregnant. Talk to your health care professional if you are concerned about your fertility. This medicine has caused decreased sperm counts in some men. This may make it more difficult to father a child. Talk to your health care professional if you are concerned about your fertility. This medicine may increase your risk of getting an infection. Call your health care professional for advice if you get a fever, chills, or sore throat, or other symptoms of a cold or flu. Do not treat yourself. Try to avoid being around people who are sick. Avoid taking medicines that contain aspirin, acetaminophen, ibuprofen, naproxen, or ketoprofen unless instructed by your health care professional. These medicines may hide a fever. Be careful brushing or flossing your teeth or using a toothpick because you may get an infection or bleed more easily. If you have any dental work done, tell your dentist you are receiving this medicine. What side effects may I notice from receiving this medication? Side effects that you should report to your doctor or health care professional as soon as possible: allergic reactions like skin rash, itching or hives, swelling of the face, lips, or tongue breathing  problems cough low blood counts - this medicine may decrease the number of white blood cells, red blood cells, and platelets. You may be at increased risk for infections and bleeding nausea, vomiting pain, redness, or irritation at site where injected pain, tingling, numbness in the hands or feet signs and symptoms of bleeding such as bloody or black, tarry stools; red or dark brown urine; spitting up blood or brown material that looks like coffee grounds; red spots on the skin; unusual bruising or bleeding from the eyes, gums, or nose signs and symptoms of a dangerous change in heartbeat or heart rhythm like chest pain; dizziness; fast, irregular heartbeat; palpitations; feeling faint or lightheaded; falls signs and symptoms of infection like fever; chills; cough; sore throat; pain or trouble passing urine signs and symptoms of liver injury like dark yellow or brown urine; general ill feeling or flu-like symptoms; light-colored stools; loss of appetite; nausea; right upper belly pain; unusually weak or tired; yellowing of the eyes or skin signs and  symptoms of low red blood cells or anemia such as unusually weak or tired; feeling faint or lightheaded; falls signs and symptoms of muscle injury like dark urine; trouble passing urine or change in the amount of urine; unusually weak or tired; muscle pain; back pain Side effects that usually do not require medical attention (report to your doctor or health care professional if they continue or are bothersome): changes in taste diarrhea gas hair loss loss of appetite mouth sores This list may not describe all possible side effects. Call your doctor for medical advice about side effects. You may report side effects to FDA at 1-800-FDA-1088. Where should I keep my medication? This drug is given in a hospital or clinic and will not be stored at home. NOTE: This sheet is a summary. It may not cover all possible information. If you have questions about  this medicine, talk to your doctor, pharmacist, or health care provider.  2023 Elsevier/Gold Standard (2021-01-17 00:00:00)  Leucovorin injection What is this medication? LEUCOVORIN (loo koe VOR in) is used to prevent or treat the harmful effects of some medicines. This medicine is used to treat anemia caused by a low amount of folic acid in the body. It is also used with 5-fluorouracil (5-FU) to treat colon cancer. This medicine may be used for other purposes; ask your health care provider or pharmacist if you have questions. What should I tell my care team before I take this medication? They need to know if you have any of these conditions: anemia from low levels of vitamin B-12 in the blood an unusual or allergic reaction to leucovorin, folic acid, other medicines, foods, dyes, or preservatives pregnant or trying to get pregnant breast-feeding How should I use this medication? This medicine is for injection into a muscle or into a vein. It is given by a health care professional in a hospital or clinic setting. Talk to your pediatrician regarding the use of this medicine in children. Special care may be needed. Overdosage: If you think you have taken too much of this medicine contact a poison control center or emergency room at once. NOTE: This medicine is only for you. Do not share this medicine with others. What if I miss a dose? This does not apply. What may interact with this medication? capecitabine fluorouracil phenobarbital phenytoin primidone trimethoprim-sulfamethoxazole This list may not describe all possible interactions. Give your health care provider a list of all the medicines, herbs, non-prescription drugs, or dietary supplements you use. Also tell them if you smoke, drink alcohol, or use illegal drugs. Some items may interact with your medicine. What should I watch for while using this medication? Your condition will be monitored carefully while you are receiving this  medicine. This medicine may increase the side effects of 5-fluorouracil, 5-FU. Tell your doctor or health care professional if you have diarrhea or mouth sores that do not get better or that get worse. What side effects may I notice from receiving this medication? Side effects that you should report to your doctor or health care professional as soon as possible: allergic reactions like skin rash, itching or hives, swelling of the face, lips, or tongue breathing problems fever, infection mouth sores unusual bleeding or bruising unusually weak or tired Side effects that usually do not require medical attention (report to your doctor or health care professional if they continue or are bothersome): constipation or diarrhea loss of appetite nausea, vomiting This list may not describe all possible side effects. Call your doctor  for medical advice about side effects. You may report side effects to FDA at 1-800-FDA-1088. Where should I keep my medication? This drug is given in a hospital or clinic and will not be stored at home. NOTE: This sheet is a summary. It may not cover all possible information. If you have questions about this medicine, talk to your doctor, pharmacist, or health care provider.  2023 Elsevier/Gold Standard (2007-08-25 00:00:00)  Irinotecan injection What is this medication? IRINOTECAN (ir in oh TEE kan ) is a chemotherapy drug. It is used to treat colon and rectal cancer. This medicine may be used for other purposes; ask your health care provider or pharmacist if you have questions. COMMON BRAND NAME(S): Camptosar What should I tell my care team before I take this medication? They need to know if you have any of these conditions: dehydration diarrhea infection (especially a virus infection such as chickenpox, cold sores, or herpes) liver disease low blood counts, like low white cell, platelet, or red cell counts low levels of calcium, magnesium, or potassium in the  blood recent or ongoing radiation therapy an unusual or allergic reaction to irinotecan, other medicines, foods, dyes, or preservatives pregnant or trying to get pregnant breast-feeding How should I use this medication? This drug is given as an infusion into a vein. It is administered in a hospital or clinic by a specially trained health care professional. Talk to your pediatrician regarding the use of this medicine in children. Special care may be needed. Overdosage: If you think you have taken too much of this medicine contact a poison control center or emergency room at once. NOTE: This medicine is only for you. Do not share this medicine with others. What if I miss a dose? It is important not to miss your dose. Call your doctor or health care professional if you are unable to keep an appointment. What may interact with this medication? Do not take this medicine with any of the following medications: cobicistat itraconazole This medicine may interact with the following medications: antiviral medicines for HIV or AIDS certain antibiotics like rifampin or rifabutin certain medicines for fungal infections like ketoconazole, posaconazole, and voriconazole certain medicines for seizures like carbamazepine, phenobarbital, phenotoin clarithromycin gemfibrozil nefazodone St. John's Wort This list may not describe all possible interactions. Give your health care provider a list of all the medicines, herbs, non-prescription drugs, or dietary supplements you use. Also tell them if you smoke, drink alcohol, or use illegal drugs. Some items may interact with your medicine. What should I watch for while using this medication? Your condition will be monitored carefully while you are receiving this medicine. You will need important blood work done while you are taking this medicine. This drug may make you feel generally unwell. This is not uncommon, as chemotherapy can affect healthy cells as well as  cancer cells. Report any side effects. Continue your course of treatment even though you feel ill unless your doctor tells you to stop. In some cases, you may be given additional medicines to help with side effects. Follow all directions for their use. You may get drowsy or dizzy. Do not drive, use machinery, or do anything that needs mental alertness until you know how this medicine affects you. Do not stand or sit up quickly, especially if you are an older patient. This reduces the risk of dizzy or fainting spells. Call your health care professional for advice if you get a fever, chills, or sore throat, or other symptoms of a  cold or flu. Do not treat yourself. This medicine decreases your body's ability to fight infections. Try to avoid being around people who are sick. Avoid taking products that contain aspirin, acetaminophen, ibuprofen, naproxen, or ketoprofen unless instructed by your doctor. These medicines may hide a fever. This medicine may increase your risk to bruise or bleed. Call your doctor or health care professional if you notice any unusual bleeding. Be careful brushing and flossing your teeth or using a toothpick because you may get an infection or bleed more easily. If you have any dental work done, tell your dentist you are receiving this medicine. Do not become pregnant while taking this medicine or for 6 months after stopping it. Women should inform their health care professional if they wish to become pregnant or think they might be pregnant. Men should not father a child while taking this medicine and for 3 months after stopping it. There is potential for serious side effects to an unborn child. Talk to your health care professional for more information. Do not breast-feed an infant while taking this medicine or for 7 days after stopping it. This medicine has caused ovarian failure in some women. This medicine may make it more difficult to get pregnant. Talk to your health care  professional if you are concerned about your fertility. This medicine has caused decreased sperm counts in some men. This may make it more difficult to father a child. Talk to your health care professional if you are concerned about your fertility. What side effects may I notice from receiving this medication? Side effects that you should report to your doctor or health care professional as soon as possible: allergic reactions like skin rash, itching or hives, swelling of the face, lips, or tongue chest pain diarrhea flushing, runny nose, sweating during infusion low blood counts - this medicine may decrease the number of white blood cells, red blood cells and platelets. You may be at increased risk for infections and bleeding. nausea, vomiting pain, swelling, warmth in the leg signs of decreased platelets or bleeding - bruising, pinpoint red spots on the skin, black, tarry stools, blood in the urine signs of infection - fever or chills, cough, sore throat, pain or difficulty passing urine signs of decreased red blood cells - unusually weak or tired, fainting spells, lightheadedness Side effects that usually do not require medical attention (report to your doctor or health care professional if they continue or are bothersome): constipation hair loss headache loss of appetite mouth sores stomach pain This list may not describe all possible side effects. Call your doctor for medical advice about side effects. You may report side effects to FDA at 1-800-FDA-1088. Where should I keep my medication? This drug is given in a hospital or clinic and will not be stored at home. NOTE: This sheet is a summary. It may not cover all possible information. If you have questions about this medicine, talk to your doctor, pharmacist, or health care provider.  2023 Elsevier/Gold Standard (2021-01-17 00:00:00)  Fluorouracil, 5-FU injection What is this medication? FLUOROURACIL, 5-FU (flure oh YOOR a sil) is  a chemotherapy drug. It slows the growth of cancer cells. This medicine is used to treat many types of cancer like breast cancer, colon or rectal cancer, pancreatic cancer, and stomach cancer. This medicine may be used for other purposes; ask your health care provider or pharmacist if you have questions. COMMON BRAND NAME(S): Adrucil What should I tell my care team before I take this medication? They  need to know if you have any of these conditions: blood disorders dihydropyrimidine dehydrogenase (DPD) deficiency infection (especially a virus infection such as chickenpox, cold sores, or herpes) kidney disease liver disease malnourished, poor nutrition recent or ongoing radiation therapy an unusual or allergic reaction to fluorouracil, other chemotherapy, other medicines, foods, dyes, or preservatives pregnant or trying to get pregnant breast-feeding How should I use this medication? This drug is given as an infusion or injection into a vein. It is administered in a hospital or clinic by a specially trained health care professional. Talk to your pediatrician regarding the use of this medicine in children. Special care may be needed. Overdosage: If you think you have taken too much of this medicine contact a poison control center or emergency room at once. NOTE: This medicine is only for you. Do not share this medicine with others. What if I miss a dose? It is important not to miss your dose. Call your doctor or health care professional if you are unable to keep an appointment. What may interact with this medication? Do not take this medicine with any of the following medications: live virus vaccines This medicine may also interact with the following medications: medicines that treat or prevent blood clots like warfarin, enoxaparin, and dalteparin This list may not describe all possible interactions. Give your health care provider a list of all the medicines, herbs, non-prescription drugs,  or dietary supplements you use. Also tell them if you smoke, drink alcohol, or use illegal drugs. Some items may interact with your medicine. What should I watch for while using this medication? Visit your doctor for checks on your progress. This drug may make you feel generally unwell. This is not uncommon, as chemotherapy can affect healthy cells as well as cancer cells. Report any side effects. Continue your course of treatment even though you feel ill unless your doctor tells you to stop. In some cases, you may be given additional medicines to help with side effects. Follow all directions for their use. Call your doctor or health care professional for advice if you get a fever, chills or sore throat, or other symptoms of a cold or flu. Do not treat yourself. This drug decreases your body's ability to fight infections. Try to avoid being around people who are sick. This medicine may increase your risk to bruise or bleed. Call your doctor or health care professional if you notice any unusual bleeding. Be careful brushing and flossing your teeth or using a toothpick because you may get an infection or bleed more easily. If you have any dental work done, tell your dentist you are receiving this medicine. Avoid taking products that contain aspirin, acetaminophen, ibuprofen, naproxen, or ketoprofen unless instructed by your doctor. These medicines may hide a fever. Do not become pregnant while taking this medicine. Women should inform their doctor if they wish to become pregnant or think they might be pregnant. There is a potential for serious side effects to an unborn child. Talk to your health care professional or pharmacist for more information. Do not breast-feed an infant while taking this medicine. Men should inform their doctor if they wish to father a child. This medicine may lower sperm counts. Do not treat diarrhea with over the counter products. Contact your doctor if you have diarrhea that lasts  more than 2 days or if it is severe and watery. This medicine can make you more sensitive to the sun. Keep out of the sun. If you cannot avoid being  in the sun, wear protective clothing and use sunscreen. Do not use sun lamps or tanning beds/booths. What side effects may I notice from receiving this medication? Side effects that you should report to your doctor or health care professional as soon as possible: allergic reactions like skin rash, itching or hives, swelling of the face, lips, or tongue low blood counts - this medicine may decrease the number of white blood cells, red blood cells and platelets. You may be at increased risk for infections and bleeding. signs of infection - fever or chills, cough, sore throat, pain or difficulty passing urine signs of decreased platelets or bleeding - bruising, pinpoint red spots on the skin, black, tarry stools, blood in the urine signs of decreased red blood cells - unusually weak or tired, fainting spells, lightheadedness breathing problems changes in vision chest pain mouth sores nausea and vomiting pain, swelling, redness at site where injected pain, tingling, numbness in the hands or feet redness, swelling, or sores on hands or feet stomach pain unusual bleeding Side effects that usually do not require medical attention (report to your doctor or health care professional if they continue or are bothersome): changes in finger or toe nails diarrhea dry or itchy skin hair loss headache loss of appetite sensitivity of eyes to the light stomach upset unusually teary eyes This list may not describe all possible side effects. Call your doctor for medical advice about side effects. You may report side effects to FDA at 1-800-FDA-1088. Where should I keep my medication? This drug is given in a hospital or clinic and will not be stored at home. NOTE: This sheet is a summary. It may not cover all possible information. If you have questions about  this medicine, talk to your doctor, pharmacist, or health care provider.  2023 [employer]vier/[employer] Standard (2021-01-17 00:00:00)

## 2021-08-06 LAB — CANCER ANTIGEN 19-9: CA 19-9: 58599 U/mL — ABNORMAL HIGH (ref 0–35)

## 2021-08-07 ENCOUNTER — Inpatient Hospital Stay: Payer: Medicaid Other

## 2021-08-07 VITALS — BP 155/96 | HR 86 | Temp 98.3°F | Resp 18

## 2021-08-07 DIAGNOSIS — C252 Malignant neoplasm of tail of pancreas: Secondary | ICD-10-CM

## 2021-08-07 DIAGNOSIS — Z5111 Encounter for antineoplastic chemotherapy: Secondary | ICD-10-CM | POA: Diagnosis not present

## 2021-08-07 MED ORDER — HEPARIN SOD (PORK) LOCK FLUSH 100 UNIT/ML IV SOLN
500.0000 [IU] | Freq: Once | INTRAVENOUS | Status: AC | PRN
  Administered 2021-08-07: 500 [IU]

## 2021-08-07 MED ORDER — SODIUM CHLORIDE 0.9% FLUSH
10.0000 mL | INTRAVENOUS | Status: DC | PRN
  Administered 2021-08-07: 10 mL

## 2021-08-07 NOTE — Patient Instructions (Signed)

## 2021-08-17 ENCOUNTER — Other Ambulatory Visit: Payer: Self-pay | Admitting: Oncology

## 2021-08-19 ENCOUNTER — Inpatient Hospital Stay: Payer: Medicaid Other

## 2021-08-19 ENCOUNTER — Inpatient Hospital Stay (HOSPITAL_BASED_OUTPATIENT_CLINIC_OR_DEPARTMENT_OTHER): Payer: Medicaid Other | Admitting: Nurse Practitioner

## 2021-08-19 ENCOUNTER — Other Ambulatory Visit: Payer: Self-pay

## 2021-08-19 ENCOUNTER — Encounter: Payer: Self-pay | Admitting: Nurse Practitioner

## 2021-08-19 ENCOUNTER — Encounter: Payer: Self-pay | Admitting: *Deleted

## 2021-08-19 VITALS — BP 145/88 | HR 100 | Temp 98.1°F | Resp 18 | Ht 63.0 in | Wt 121.8 lb

## 2021-08-19 DIAGNOSIS — C252 Malignant neoplasm of tail of pancreas: Secondary | ICD-10-CM | POA: Diagnosis not present

## 2021-08-19 DIAGNOSIS — Z95828 Presence of other vascular implants and grafts: Secondary | ICD-10-CM | POA: Diagnosis not present

## 2021-08-19 DIAGNOSIS — C259 Malignant neoplasm of pancreas, unspecified: Secondary | ICD-10-CM

## 2021-08-19 DIAGNOSIS — C787 Secondary malignant neoplasm of liver and intrahepatic bile duct: Secondary | ICD-10-CM

## 2021-08-19 DIAGNOSIS — Z5111 Encounter for antineoplastic chemotherapy: Secondary | ICD-10-CM | POA: Diagnosis not present

## 2021-08-19 LAB — CMP (CANCER CENTER ONLY)
ALT: 17 U/L (ref 0–44)
AST: 22 U/L (ref 15–41)
Albumin: 3.5 g/dL (ref 3.5–5.0)
Alkaline Phosphatase: 268 U/L — ABNORMAL HIGH (ref 38–126)
Anion gap: 9 (ref 5–15)
BUN: 6 mg/dL — ABNORMAL LOW (ref 8–23)
CO2: 28 mmol/L (ref 22–32)
Calcium: 10.1 mg/dL (ref 8.9–10.3)
Chloride: 101 mmol/L (ref 98–111)
Creatinine: 0.83 mg/dL (ref 0.44–1.00)
GFR, Estimated: >60 mL/min (ref >60)
Glucose, Bld: 141 mg/dL — ABNORMAL HIGH (ref 70–99)
Potassium: 3.9 mmol/L (ref 3.5–5.1)
Sodium: 138 mmol/L (ref 135–145)
Total Bilirubin: 0.4 mg/dL (ref 0.3–1.2)
Total Protein: 7.3 g/dL (ref 6.5–8.1)

## 2021-08-19 LAB — CBC WITH DIFFERENTIAL (CANCER CENTER ONLY)
Abs Immature Granulocytes: 0 10*3/uL (ref 0.00–0.07)
Basophils Absolute: 0.1 10*3/uL (ref 0.0–0.1)
Basophils Relative: 1 %
Eosinophils Absolute: 0.1 10*3/uL (ref 0.0–0.5)
Eosinophils Relative: 1 %
HCT: 34.7 % — ABNORMAL LOW (ref 36.0–46.0)
Hemoglobin: 10.9 g/dL — ABNORMAL LOW (ref 12.0–15.0)
Immature Granulocytes: 0 %
Lymphocytes Relative: 42 %
Lymphs Abs: 2 10*3/uL (ref 0.7–4.0)
MCH: 28.3 pg (ref 26.0–34.0)
MCHC: 31.4 g/dL (ref 30.0–36.0)
MCV: 90.1 fL (ref 80.0–100.0)
Monocytes Absolute: 0.8 10*3/uL (ref 0.1–1.0)
Monocytes Relative: 15 %
Neutro Abs: 2 10*3/uL (ref 1.7–7.7)
Neutrophils Relative %: 41 %
Platelet Count: 227 10*3/uL (ref 150–400)
RBC: 3.85 MIL/uL — ABNORMAL LOW (ref 3.87–5.11)
RDW: 14.6 % (ref 11.5–15.5)
WBC Count: 4.9 10*3/uL (ref 4.0–10.5)
nRBC: 0 % (ref 0.0–0.2)

## 2021-08-19 MED ORDER — SODIUM CHLORIDE 0.9% FLUSH
10.0000 mL | INTRAVENOUS | Status: AC | PRN
  Administered 2021-08-19: 10 mL via INTRAVENOUS

## 2021-08-19 MED ORDER — HEPARIN SOD (PORK) LOCK FLUSH 100 UNIT/ML IV SOLN
500.0000 [IU] | Freq: Once | INTRAVENOUS | Status: AC
  Administered 2021-08-19: 500 [IU] via INTRAVENOUS

## 2021-08-19 NOTE — Progress Notes (Signed)
[facility] OFFICE PROGRESS NOTE   Diagnosis:  Pancreas [name]  INTERVAL HISTORY:   Kirsten [name] returns as scheduled.  She completed cycle 2 FOLFIRINOX 08/05/2021.  She denies nausea/vomiting.  No mouth sores.  No diarrhea.  She notes increased numbness/tingling in the hands and feet/lower legs.  No interference with activity.  Objective:  Vital signs in last 24 hours:  Blood pressure (!) 145/88, pulse 100, temperature 98.1 F (36.7 C), temperature source Oral, resp. rate 18, height '5\' 3"'$  (1.6 m), weight 121 lb 12.8 oz (55.2 kg), SpO2 100 %.    HEENT: No thrush or ulcers. Resp: Lungs clear bilaterally. Cardio: Regular rate and rhythm. GI: Liver palpable right upper abdomen, associated tenderness. Vascular: No leg edema. Neuro: Vibratory sense with moderate to severe decrease over the fingertips per tuning fork exam. Skin: Palms without erythema. Port-A-Cath without erythema.   Lab Results:  Lab Results  Component Value Date   WBC 4.9 08/19/2021   HGB 10.9 (L) 08/19/2021   HCT 34.7 (L) 08/19/2021   MCV 90.1 08/19/2021   PLT 227 08/19/2021   NEUTROABS 2.0 08/19/2021    Imaging:  No results found.  Medications: I have reviewed the patient's current medications.  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, MSS, tumor mutation burden 0 Cycle 1 FOLFOX 07/01/2020 Cycle 2 FOLFOX 07/16/2020 Cycle 3 FOLFOX 07/31/2020 Cycle 4 FOLFIRINOX  08/13/2020 Cycle 5 FOLFIRINOX 09/03/2020, Udenyca CTs 09/13/2020-unchanged nodule medial left lower lobe; stable nodal and likely peritoneal disease in the abdomen/pelvis; unchanged pancreatic tail mass; multifocal hepatic metastases, mildly progressive Cycle 6 FOLFIRINOX 09/16/2020, Oxaliplatin held Cycle 7 FOLFIRINOX 09/30/2020 Cycle 8 FOLFIRINOX 10/14/2020, oxaliplatin held Cycle 9 FOLFIRINOX 10/28/2020, oxaliplatin held Cycle 10 FOLFIRINOX 11/11/2020, oxaliplatin held Cycle 11 FOLFIRINOX 11/25/2020, oxaliplatin held CTs 12/06/2020-primary pancreatic tail tumor decreased in size.  Widespread liver metastases all decreased.  Previously visualized peritoneal nodule adjacent to the distal stomach resolved.  Left lower lobe pulmonary nodule stable.  No new or progressive metastatic disease. Cycle 12 FOLFIRI 12/09/2020 Cycle 13 FOLFIRI 12/23/2020 Cycle 14 FOLFIRI 01/06/2021 Cycle 15 FOLFIRI 01/20/2021 Cycle 16 FOLFIRI 02/03/2021 Cycle 17 FOLFIRI 02/17/2021 CTs 02/27/2021-slight decrease in size of pancreas tail mass and hypodense liver lesions, unchanged left lower lobe nodule, enlargement of a single peritoneal nodule anterior to the distal descending colon Cycle 18 FOLFIRI 03/04/2021 Cycle 19 FOLFIRI 03/17/2021 Cycle 20 FOLFIRI 03/31/2021 Cycle 21 FOLFIRI 04/14/2021 CTs 04/24/2021-unchanged nodule left lower lobe.  Numerous hypodense liver lesions, a few of which are clearly increased in size.  Unchanged mass pancreatic tail. Cycle 1 gemcitabine/Abraxane 05/05/2021 Cycle 2 gemcitabine/Abraxane 05/19/2021 Cycle 3 gemcitabine/Abraxane 06/02/2021 Cycle 4 gemcitabine/Abraxane 06/16/2021 Cycle 5 gemcitabine/Abraxane 06/30/2021 CTs 07/11/2021-enlargement of pancreas tail mass and liver metastases, stable left lower lobe nodule, stable left lower quadrant omental nodule, stable bilateral ovarian lesions-not definitely simple cysts Cycle 1 FOLFIRINOX 07/21/2021 Cycle 2 FOLFIRINOX 08/05/2021 2.  Pain secondary to  #1-improved 3.  Weight loss-improved 4.  Family history of pancreas [name] 5.  Neutropenia secondary chemotherapy-G-CSF added with cycle 5 FOLFIRINOX 6.  Oxaliplatin neuropathy-oxaliplatin held 09/16/2020, 10/14/2020, 10/28/2020, 11/11/2020, 11/25/2020    Disposition: Kirsten [name] appears stable.  She has completed 2 cycles of retreatment with FOLFIRINOX.  She has progressive neuropathy symptoms.  The CA 19-9 tumor marker was higher 2 weeks ago.  We decided to hold today's treatment, reschedule for later this week if the CA 19-9 tumor marker is better.  If the tumor marker is higher the plan is to change treatment, potentially liposomal irinotecan.  [facility] [name] agrees with this plan.  Patient seen with Dr. Benay Spice.    Ned Card ANP/GNP-BC   08/19/2021  8:55 AM This was a shared visit with Ned Card.  Kirsten [name] was interviewed and examined.  She has completed 2 cycles of salvage therapy with FOLFIRINOX.  She has developed progressive neuropathy symptoms.  We discussed the risk/benefit of continuing oxaliplatin based chemotherapy.  We decided to hold chemotherapy today and follow-up on the CA 19-9.  The plan is to proceed with dose reduced oxaliplatin if the CA 19-9 has improved.  I was present for greater than 50% of today's visit.  I performed medical decision making.  [name], MD

## 2021-08-19 NOTE — Addendum Note (Signed)
Addended by: Velna Hatchet on: 08/19/2021 09:30 AM   Modules accepted: Orders

## 2021-08-19 NOTE — Patient Instructions (Signed)
Heparin injection ?What is this medication? ?HEPARIN (HEP a rin) is an anticoagulant. It is used to treat or prevent clots in the veins, arteries, lungs, or heart. It stops clots from forming or getting bigger. This medicine prevents clotting during open-heart surgery, dialysis, or in patients who are confined to bed. ?This medicine may be used for other purposes; ask your health care provider or pharmacist if you have questions. ?COMMON BRAND NAME(S): Hep-Lock, Hep-Lock U/P, Hepflush-10, Monoject Prefill Advanced Heparin Lock Flush, SASH Normal Saline and Heparin ?What should I tell my care team before I take this medication? ?They need to know if you have any of these conditions: ?bleeding disorders, such as hemophilia or low blood platelets ?bowel disease or diverticulitis ?endocarditis ?high blood pressure ?liver disease ?recent surgery or delivery of a baby ?stomach ulcers ?an unusual or allergic reaction to heparin, benzyl alcohol, sulfites, other medicines, foods, dyes, or preservatives ?pregnant or trying to get pregnant ?breast-feeding ?How should I use this medication? ?This medicine is given by injection or infusion into a vein. It can also be given by injection of small amounts under the skin. It is usually given by a health care professional in a hospital or clinic setting. ?If you get this medicine at home, you will be taught how to prepare and give this medicine. Use exactly as directed. Take your medicine at regular intervals. Do not take it more often than directed. Do not stop taking except on your doctor's advice. Stopping this medicine may increase your risk of a blot clot. Be sure to refill your prescription before you run out of medicine. ?It is important that you put your used needles and syringes in a special sharps container. Do not put them in a trash can. If you do not have a sharps container, call your pharmacist or healthcare provider to get one. ?Talk to your pediatrician regarding the  use of this medicine in children. While this medicine may be prescribed for children for selected conditions, precautions do apply. ?Overdosage: If you think you have taken too much of this medicine contact a poison control center or emergency room at once. ?NOTE: This medicine is only for you. Do not share this medicine with others. ?What if I miss a dose? ?If you miss a dose, take it as soon as you can. If it is almost time for your next dose, take only that dose. Do not take double or extra doses. ?What may interact with this medication? ?Do not take this medicine with any of the following medications: ?aspirin and aspirin-like drugs ?mifepristone ?medicines that treat or prevent blood clots like warfarin, enoxaparin, and dalteparin ?palifermin ?protamine ?This medicine may also interact with the following medications: ?dextran ?digoxin ?hydroxychloroquine ?medicines for treating colds or allergies ?nicotine ?NSAIDs, medicines for pain and inflammation, like ibuprofen or naproxen ?phenylbutazone ?tetracycline antibiotics ?This list may not describe all possible interactions. Give your health care provider a list of all the medicines, herbs, non-prescription drugs, or dietary supplements you use. Also tell them if you smoke, drink alcohol, or use illegal drugs. Some items may interact with your medicine. ?What should I watch for while using this medication? ?Visit your healthcare professional for regular checks on your progress. You may need blood work done while you are taking this medicine. Your condition will be monitored carefully while you are receiving this medicine. It is important not to miss any appointments. ?Wear a medical ID bracelet or chain, and carry a card that describes your disease and details   of your medicine and dosage times. ?Notify your doctor or healthcare professional at once if you have cold, blue hands or feet. ?If you are going to need surgery or other procedure, tell your healthcare  professional that you are using this medicine. ?Avoid sports and activities that might cause injury while you are using this medicine. Severe falls or injuries can cause unseen bleeding. Be careful when using sharp tools or knives. Consider using an electric razor. Take special care brushing or flossing your teeth. Report any injuries, bruising, or red spots on the skin to your healthcare professional. ?Using this medicine for a long time may weaken your bones and increase the risk of bone fractures. ?You should make sure that you get enough calcium and vitamin D while you are taking this medicine. Discuss the foods you eat and the vitamins you take with your healthcare professional. ?Wear a medical ID bracelet or chain. Carry a card that describes your disease and details of your medicine and dosage times. ?What side effects may I notice from receiving this medication? ?Side effects that you should report to your doctor or health care professional as soon as possible: ?allergic reactions like skin rash, itching or hives, swelling of the face, lips, or tongue ?bone pain ?fever, chills ?nausea, vomiting ?signs and symptoms of bleeding such as bloody or black, tarry stools; red or dark-brown urine; spitting up blood or brown material that looks like coffee grounds; red spots on the skin; unusual bruising or bleeding from the eye, gums, or nose ?signs and symptoms of a blood clot such as chest pain; shortness of breath; pain, swelling, or warmth in the leg ?signs and symptoms of a stroke such as changes in vision; confusion; trouble speaking or understanding; severe headaches; sudden numbness or weakness of the face, arm or leg; trouble walking; dizziness; loss of coordination ?Side effects that usually do not require medical attention (report to your doctor or health care professional if they continue or are bothersome): ?hair loss ?pain, redness, or irritation at site where injected ?This list may not describe all  possible side effects. Call your doctor for medical advice about side effects. You may report side effects to FDA at 1-800-FDA-1088. ?Where should I keep my medication? ?Keep out of the reach of children. ?Store unopened vials at room temperature between 15 and 30 degrees C (59 and 86 degrees F). Do not freeze. Do not use if solution is discolored or particulate matter is present. Throw away any unused medicine after the expiration date. ?NOTE: This sheet is a summary. It may not cover all possible information. If you have questions about this medicine, talk to your doctor, pharmacist, or health care provider. ?? 2023 Elsevier/Gold Standard (2020-03-28 00:00:00) ? ?

## 2021-08-19 NOTE — Progress Notes (Signed)
Patient seen by Ned Card NP today  Vitals are within treatment parameters.  Labs reviewed by Ned Card NP and are within treatment parameters.  Per physician team, patient will not be receiving treatment today. Having neuropathy. MD has decided to hold today and reschedule for 6/21 if CEA is not higher with dose reduction on oxaliplatin.

## 2021-08-20 ENCOUNTER — Inpatient Hospital Stay: Payer: Medicaid Other

## 2021-08-21 ENCOUNTER — Inpatient Hospital Stay: Payer: Medicaid Other

## 2021-08-21 VITALS — BP 150/83 | HR 95 | Temp 97.2°F | Resp 16

## 2021-08-21 DIAGNOSIS — Z5111 Encounter for antineoplastic chemotherapy: Secondary | ICD-10-CM | POA: Diagnosis not present

## 2021-08-21 DIAGNOSIS — C252 Malignant neoplasm of tail of pancreas: Secondary | ICD-10-CM

## 2021-08-21 LAB — CANCER ANTIGEN 19-9: CA 19-9: 48633 U/mL — ABNORMAL HIGH (ref 0–35)

## 2021-08-21 MED ORDER — PALONOSETRON HCL INJECTION 0.25 MG/5ML
0.2500 mg | Freq: Once | INTRAVENOUS | Status: AC
  Administered 2021-08-21: 0.25 mg via INTRAVENOUS
  Filled 2021-08-21: qty 5

## 2021-08-21 MED ORDER — SODIUM CHLORIDE 0.9 % IV SOLN
2400.0000 mg/m2 | INTRAVENOUS | Status: DC
  Administered 2021-08-21: 3850 mg via INTRAVENOUS
  Filled 2021-08-21: qty 77

## 2021-08-21 MED ORDER — ATROPINE SULFATE 1 MG/ML IV SOLN
0.5000 mg | Freq: Once | INTRAVENOUS | Status: AC
  Administered 2021-08-21: 0.5 mg via INTRAVENOUS
  Filled 2021-08-21: qty 1

## 2021-08-21 MED ORDER — SODIUM CHLORIDE 0.9 % IV SOLN
400.0000 mg/m2 | Freq: Once | INTRAVENOUS | Status: AC
  Administered 2021-08-21: 640 mg via INTRAVENOUS
  Filled 2021-08-21: qty 32

## 2021-08-21 MED ORDER — SODIUM CHLORIDE 0.9 % IV SOLN
150.0000 mg | Freq: Once | INTRAVENOUS | Status: AC
  Administered 2021-08-21: 150 mg via INTRAVENOUS
  Filled 2021-08-21: qty 5

## 2021-08-21 MED ORDER — SODIUM CHLORIDE 0.9 % IV SOLN
125.0000 mg/m2 | Freq: Once | INTRAVENOUS | Status: AC
  Administered 2021-08-21: 200 mg via INTRAVENOUS
  Filled 2021-08-21: qty 10

## 2021-08-21 MED ORDER — SODIUM CHLORIDE 0.9 % IV SOLN
10.0000 mg | Freq: Once | INTRAVENOUS | Status: AC
  Administered 2021-08-21: 10 mg via INTRAVENOUS
  Filled 2021-08-21: qty 1

## 2021-08-21 MED ORDER — DEXTROSE 5 % IV SOLN: Freq: Once | INTRAVENOUS | Status: AC

## 2021-08-21 MED ORDER — OXALIPLATIN CHEMO INJECTION 100 MG/20ML
40.0000 mg/m2 | Freq: Once | INTRAVENOUS | Status: AC
  Administered 2021-08-21: 65 mg via INTRAVENOUS
  Filled 2021-08-21: qty 13

## 2021-08-21 NOTE — Progress Notes (Signed)
Resting pulse rate 105 with 2nd check manually. OK to treat per Dr. Benay Spice. CA 19.9 is improved-OK to treat

## 2021-08-21 NOTE — Patient Instructions (Addendum)
[facility]   The chemotherapy medication bag should finish at 46 hours, 96 hours, or 7 days. For example, if your pump is scheduled for 46 hours and it was put on at 4:00 p.m., it should finish at 2:00 p.m. the day it is scheduled to come off regardless of your appointment time.    Estimated time to finish at 12:30 Saturday,  August 23, 2021 At [facility]   If the display on your pump reads "Low Volume" and it is beeping, take the batteries out of the pump and come to the cancer center for it to be taken off.   If the pump alarms go off prior to the pump reading "Low Volume" then call [phone] and someone can assist you.  If the plunger comes out and the chemotherapy medication is leaking out, please use your home chemo spill kit to clean up the spill. Do NOT use paper towels or other household products.  If you have problems or questions regarding your pump, please call either [phone] (24 hours a day) or the cancer center Monday-Friday 8:00 a.m.- 4:30 p.m. at the clinic number and we will assist you. If you are unable to get assistance, then go to the nearest Emergency Department and ask the staff to contact the IV team for assistance.  Discharge Instructions: Thank you for choosing [facility] to provide your oncology and hematology care.   If you have a lab appointment with the [facility], please go directly to the [facility] and check in at the registration area.   Wear comfortable clothing and clothing appropriate for easy access to any Portacath or PICC line.   We strive to give you quality time with your provider. You may need to reschedule your appointment if you arrive late (15 or more minutes).  Arriving late affects you and other patients whose appointments are after yours.  Also, if you miss three or more appointments without notifying the office, you may be dismissed from the clinic at the provider's  discretion.      For prescription refill requests, have your pharmacy contact our office and allow 72 hours for refills to be completed.    Today you received the following chemotherapy and/or immunotherapy agents Oxaliplatin, Leucovorin, Irinotecan, Fluorouracil.      To help prevent nausea and vomiting after your treatment, we encourage you to take your nausea medication as directed.  BELOW ARE SYMPTOMS THAT SHOULD BE REPORTED IMMEDIATELY: *FEVER GREATER THAN 100.4 F (38 C) OR HIGHER *CHILLS OR SWEATING *NAUSEA AND VOMITING THAT IS NOT CONTROLLED WITH YOUR NAUSEA MEDICATION *UNUSUAL SHORTNESS OF BREATH *UNUSUAL BRUISING OR BLEEDING *URINARY PROBLEMS (pain or burning when urinating, or frequent urination) *BOWEL PROBLEMS (unusual diarrhea, constipation, pain near the anus) TENDERNESS IN MOUTH AND THROAT WITH OR WITHOUT PRESENCE OF ULCERS (sore throat, sores in mouth, or a toothache) UNUSUAL RASH, SWELLING OR PAIN  UNUSUAL VAGINAL DISCHARGE OR ITCHING   Items with * indicate a potential emergency and should be followed up as soon as possible or go to the Emergency Department if any problems should occur.  Please show the CHEMOTHERAPY ALERT CARD or IMMUNOTHERAPY ALERT CARD at check-in to the Emergency Department and triage nurse.  Should you have questions after your visit or need to cancel or reschedule your appointment, please contact [facility]  Dept: [phone]  and follow the prompts.  Office hours are 8:00 a.m. to 4:30 p.m. Monday -  Friday. Please note that voicemails left after 4:00 p.m. may not be returned until the following business day.  We are closed weekends and major holidays. You have access to a nurse at all times for urgent questions. Please call the main number to the clinic Dept: [phone] and follow the prompts.   For any non-urgent questions, you may also contact your provider using MyChart. We now offer e-Visits for anyone 26 and  older to request care online for non-urgent symptoms. For details visit [email or web address].   Also download the MyChart app! Go to the app store, search "MyChart", open the app, select [facility], and log in with your MyChart username and password.  Due to Covid, a mask is required upon entering the hospital/clinic. If you do not have a mask, one will be given to you upon arrival. For doctor visits, patients may have 1 support person aged 42 or older with them. For treatment visits, patients cannot have anyone with them due to current Covid guidelines and our immunocompromised population.   Oxaliplatin Injection What is this medication? OXALIPLATIN (ox AL i PLA tin) is a chemotherapy drug. It targets fast dividing cells, like cancer cells, and causes these cells to die. This medicine is used to treat cancers of the colon and rectum, and many other cancers. This medicine may be used for other purposes; ask your health care provider or pharmacist if you have questions. COMMON BRAND NAME(S): Eloxatin What should I tell my care team before I take this medication? They need to know if you have any of these conditions: heart disease history of irregular heartbeat liver disease low blood counts, like white cells, platelets, or red blood cells lung or breathing disease, like asthma take medicines that treat or prevent blood clots tingling of the fingers or toes, or other nerve disorder an unusual or allergic reaction to oxaliplatin, other chemotherapy, other medicines, foods, dyes, or preservatives pregnant or trying to get pregnant breast-feeding How should I use this medication? This drug is given as an infusion into a vein. It is administered in a hospital or clinic by a specially trained health care professional. Talk to your pediatrician regarding the use of this medicine in children. Special care may be needed. Overdosage: If you think you have taken too much of this medicine contact a  poison control center or emergency room at once. NOTE: This medicine is only for you. Do not share this medicine with others. What if I miss a dose? It is important not to miss a dose. Call your doctor or health care professional if you are unable to keep an appointment. What may interact with this medication? Do not take this medicine with any of the following medications: cisapride dronedarone pimozide thioridazine This medicine may also interact with the following medications: aspirin and aspirin-like medicines certain medicines that treat or prevent blood clots like warfarin, apixaban, dabigatran, and rivaroxaban cisplatin cyclosporine diuretics medicines for infection like acyclovir, adefovir, amphotericin B, bacitracin, cidofovir, foscarnet, ganciclovir, gentamicin, pentamidine, vancomycin NSAIDs, medicines for pain and inflammation, like ibuprofen or naproxen other medicines that prolong the QT interval (an abnormal heart rhythm) pamidronate zoledronic acid This list may not describe all possible interactions. Give your health care provider a list of all the medicines, herbs, non-prescription drugs, or dietary supplements you use. Also tell them if you smoke, drink alcohol, or use illegal drugs. Some items may interact with your medicine. What should I watch for while using this medication? Your condition will be monitored carefully  while you are receiving this medicine. You may need blood work done while you are taking this medicine. This medicine may make you feel generally unwell. This is not uncommon as chemotherapy can affect healthy cells as well as cancer cells. Report any side effects. Continue your course of treatment even though you feel ill unless your healthcare professional tells you to stop. This medicine can make you more sensitive to cold. Do not drink cold drinks or use ice. Cover exposed skin before coming in contact with cold temperatures or cold objects. When out  in cold weather wear warm clothing and cover your mouth and nose to warm the air that goes into your lungs. Tell your doctor if you get sensitive to the cold. Do not become pregnant while taking this medicine or for 9 months after stopping it. Women should inform their health care professional if they wish to become pregnant or think they might be pregnant. Men should not father a child while taking this medicine and for 6 months after stopping it. There is potential for serious side effects to an unborn child. Talk to your health care professional for more information. Do not breast-feed a child while taking this medicine or for 3 months after stopping it. This medicine has caused ovarian failure in some women. This medicine may make it more difficult to get pregnant. Talk to your health care professional if you are concerned about your fertility. This medicine has caused decreased sperm counts in some men. This may make it more difficult to father a child. Talk to your health care professional if you are concerned about your fertility. This medicine may increase your risk of getting an infection. Call your health care professional for advice if you get a fever, chills, or sore throat, or other symptoms of a cold or flu. Do not treat yourself. Try to avoid being around people who are sick. Avoid taking medicines that contain aspirin, acetaminophen, ibuprofen, naproxen, or ketoprofen unless instructed by your health care professional. These medicines may hide a fever. Be careful brushing or flossing your teeth or using a toothpick because you may get an infection or bleed more easily. If you have any dental work done, tell your dentist you are receiving this medicine. What side effects may I notice from receiving this medication? Side effects that you should report to your doctor or health care professional as soon as possible: allergic reactions like skin rash, itching or hives, swelling of the face,  lips, or tongue breathing problems cough low blood counts - this medicine may decrease the number of white blood cells, red blood cells, and platelets. You may be at increased risk for infections and bleeding nausea, vomiting pain, redness, or irritation at site where injected pain, tingling, numbness in the hands or feet signs and symptoms of bleeding such as bloody or black, tarry stools; red or dark brown urine; spitting up blood or brown material that looks like coffee grounds; red spots on the skin; unusual bruising or bleeding from the eyes, gums, or nose signs and symptoms of a dangerous change in heartbeat or heart rhythm like chest pain; dizziness; fast, irregular heartbeat; palpitations; feeling faint or lightheaded; falls signs and symptoms of infection like fever; chills; cough; sore throat; pain or trouble passing urine signs and symptoms of liver injury like dark yellow or brown urine; general ill feeling or flu-like symptoms; light-colored stools; loss of appetite; nausea; right upper belly pain; unusually weak or tired; yellowing of the eyes or  skin signs and symptoms of low red blood cells or anemia such as unusually weak or tired; feeling faint or lightheaded; falls signs and symptoms of muscle injury like dark urine; trouble passing urine or change in the amount of urine; unusually weak or tired; muscle pain; back pain Side effects that usually do not require medical attention (report to your doctor or health care professional if they continue or are bothersome): changes in taste diarrhea gas hair loss loss of appetite mouth sores This list may not describe all possible side effects. Call your doctor for medical advice about side effects. You may report side effects to FDA at 1-800-FDA-1088. Where should I keep my medication? This drug is given in a hospital or clinic and will not be stored at home. NOTE: This sheet is a summary. It may not cover all possible information. If  you have questions about this medicine, talk to your doctor, pharmacist, or health care provider.  2023 Elsevier/Gold Standard (2021-01-17 00:00:00)  Leucovorin injection What is this medication? LEUCOVORIN (loo koe VOR in) is used to prevent or treat the harmful effects of some medicines. This medicine is used to treat anemia caused by a low amount of folic acid in the body. It is also used with 5-fluorouracil (5-FU) to treat colon cancer. This medicine may be used for other purposes; ask your health care provider or pharmacist if you have questions. What should I tell my care team before I take this medication? They need to know if you have any of these conditions: anemia from low levels of vitamin B-12 in the blood an unusual or allergic reaction to leucovorin, folic acid, other medicines, foods, dyes, or preservatives pregnant or trying to get pregnant breast-feeding How should I use this medication? This medicine is for injection into a muscle or into a vein. It is given by a health care professional in a hospital or clinic setting. Talk to your pediatrician regarding the use of this medicine in children. Special care may be needed. Overdosage: If you think you have taken too much of this medicine contact a poison control center or emergency room at once. NOTE: This medicine is only for you. Do not share this medicine with others. What if I miss a dose? This does not apply. What may interact with this medication? capecitabine fluorouracil phenobarbital phenytoin primidone trimethoprim-sulfamethoxazole This list may not describe all possible interactions. Give your health care provider a list of all the medicines, herbs, non-prescription drugs, or dietary supplements you use. Also tell them if you smoke, drink alcohol, or use illegal drugs. Some items may interact with your medicine. What should I watch for while using this medication? Your condition will be monitored carefully  while you are receiving this medicine. This medicine may increase the side effects of 5-fluorouracil, 5-FU. Tell your doctor or health care professional if you have diarrhea or mouth sores that do not get better or that get worse. What side effects may I notice from receiving this medication? Side effects that you should report to your doctor or health care professional as soon as possible: allergic reactions like skin rash, itching or hives, swelling of the face, lips, or tongue breathing problems fever, infection mouth sores unusual bleeding or bruising unusually weak or tired Side effects that usually do not require medical attention (report to your doctor or health care professional if they continue or are bothersome): constipation or diarrhea loss of appetite nausea, vomiting This list may not describe all possible side effects.  Call your doctor for medical advice about side effects. You may report side effects to FDA at 1-800-FDA-1088. Where should I keep my medication? This drug is given in a hospital or clinic and will not be stored at home. NOTE: This sheet is a summary. It may not cover all possible information. If you have questions about this medicine, talk to your doctor, pharmacist, or health care provider.  2023 Elsevier/Gold Standard (2007-08-25 00:00:00)  Irinotecan injection What is this medication? IRINOTECAN (ir in oh TEE kan ) is a chemotherapy drug. It is used to treat colon and rectal cancer. This medicine may be used for other purposes; ask your health care provider or pharmacist if you have questions. COMMON BRAND NAME(S): Camptosar What should I tell my care team before I take this medication? They need to know if you have any of these conditions: dehydration diarrhea infection (especially a virus infection such as chickenpox, cold sores, or herpes) liver disease low blood counts, like low white cell, platelet, or red cell counts low levels of calcium,  magnesium, or potassium in the blood recent or ongoing radiation therapy an unusual or allergic reaction to irinotecan, other medicines, foods, dyes, or preservatives pregnant or trying to get pregnant breast-feeding How should I use this medication? This drug is given as an infusion into a vein. It is administered in a hospital or clinic by a specially trained health care professional. Talk to your pediatrician regarding the use of this medicine in children. Special care may be needed. Overdosage: If you think you have taken too much of this medicine contact a poison control center or emergency room at once. NOTE: This medicine is only for you. Do not share this medicine with others. What if I miss a dose? It is important not to miss your dose. Call your doctor or health care professional if you are unable to keep an appointment. What may interact with this medication? Do not take this medicine with any of the following medications: cobicistat itraconazole This medicine may interact with the following medications: antiviral medicines for HIV or AIDS certain antibiotics like rifampin or rifabutin certain medicines for fungal infections like ketoconazole, posaconazole, and voriconazole certain medicines for seizures like carbamazepine, phenobarbital, phenotoin clarithromycin gemfibrozil nefazodone St. John's Wort This list may not describe all possible interactions. Give your health care provider a list of all the medicines, herbs, non-prescription drugs, or dietary supplements you use. Also tell them if you smoke, drink alcohol, or use illegal drugs. Some items may interact with your medicine. What should I watch for while using this medication? Your condition will be monitored carefully while you are receiving this medicine. You will need important blood work done while you are taking this medicine. This drug may make you feel generally unwell. This is not uncommon, as chemotherapy can  affect healthy cells as well as cancer cells. Report any side effects. Continue your course of treatment even though you feel ill unless your doctor tells you to stop. In some cases, you may be given additional medicines to help with side effects. Follow all directions for their use. You may get drowsy or dizzy. Do not drive, use machinery, or do anything that needs mental alertness until you know how this medicine affects you. Do not stand or sit up quickly, especially if you are an older patient. This reduces the risk of dizzy or fainting spells. Call your health care professional for advice if you get a fever, chills, or sore throat, or other  symptoms of a cold or flu. Do not treat yourself. This medicine decreases your body's ability to fight infections. Try to avoid being around people who are sick. Avoid taking products that contain aspirin, acetaminophen, ibuprofen, naproxen, or ketoprofen unless instructed by your doctor. These medicines may hide a fever. This medicine may increase your risk to bruise or bleed. Call your doctor or health care professional if you notice any unusual bleeding. Be careful brushing and flossing your teeth or using a toothpick because you may get an infection or bleed more easily. If you have any dental work done, tell your dentist you are receiving this medicine. Do not become pregnant while taking this medicine or for 6 months after stopping it. Women should inform their health care professional if they wish to become pregnant or think they might be pregnant. Men should not father a child while taking this medicine and for 3 months after stopping it. There is potential for serious side effects to an unborn child. Talk to your health care professional for more information. Do not breast-feed an infant while taking this medicine or for 7 days after stopping it. This medicine has caused ovarian failure in some women. This medicine may make it more difficult to get  pregnant. Talk to your health care professional if you are concerned about your fertility. This medicine has caused decreased sperm counts in some men. This may make it more difficult to father a child. Talk to your health care professional if you are concerned about your fertility. What side effects may I notice from receiving this medication? Side effects that you should report to your doctor or health care professional as soon as possible: allergic reactions like skin rash, itching or hives, swelling of the face, lips, or tongue chest pain diarrhea flushing, runny nose, sweating during infusion low blood counts - this medicine may decrease the number of white blood cells, red blood cells and platelets. You may be at increased risk for infections and bleeding. nausea, vomiting pain, swelling, warmth in the leg signs of decreased platelets or bleeding - bruising, pinpoint red spots on the skin, black, tarry stools, blood in the urine signs of infection - fever or chills, cough, sore throat, pain or difficulty passing urine signs of decreased red blood cells - unusually weak or tired, fainting spells, lightheadedness Side effects that usually do not require medical attention (report to your doctor or health care professional if they continue or are bothersome): constipation hair loss headache loss of appetite mouth sores stomach pain This list may not describe all possible side effects. Call your doctor for medical advice about side effects. You may report side effects to FDA at 1-800-FDA-1088. Where should I keep my medication? This drug is given in a hospital or clinic and will not be stored at home. NOTE: This sheet is a summary. It may not cover all possible information. If you have questions about this medicine, talk to your doctor, pharmacist, or health care provider.  2023 Elsevier/Gold Standard (2021-01-17 00:00:00)  Fluorouracil, 5-FU injection What is this  medication? FLUOROURACIL, 5-FU (flure oh YOOR a sil) is a chemotherapy drug. It slows the growth of cancer cells. This medicine is used to treat many types of cancer like breast cancer, colon or rectal cancer, pancreatic cancer, and stomach cancer. This medicine may be used for other purposes; ask your health care provider or pharmacist if you have questions. COMMON BRAND NAME(S): Adrucil What should I tell my care team before I take  this medication? They need to know if you have any of these conditions: blood disorders dihydropyrimidine dehydrogenase (DPD) deficiency infection (especially a virus infection such as chickenpox, cold sores, or herpes) kidney disease liver disease malnourished, poor nutrition recent or ongoing radiation therapy an unusual or allergic reaction to fluorouracil, other chemotherapy, other medicines, foods, dyes, or preservatives pregnant or trying to get pregnant breast-feeding How should I use this medication? This drug is given as an infusion or injection into a vein. It is administered in a hospital or clinic by a specially trained health care professional. Talk to your pediatrician regarding the use of this medicine in children. Special care may be needed. Overdosage: If you think you have taken too much of this medicine contact a poison control center or emergency room at once. NOTE: This medicine is only for you. Do not share this medicine with others. What if I miss a dose? It is important not to miss your dose. Call your doctor or health care professional if you are unable to keep an appointment. What may interact with this medication? Do not take this medicine with any of the following medications: live virus vaccines This medicine may also interact with the following medications: medicines that treat or prevent blood clots like warfarin, enoxaparin, and dalteparin This list may not describe all possible interactions. Give your health care provider a  list of all the medicines, herbs, non-prescription drugs, or dietary supplements you use. Also tell them if you smoke, drink alcohol, or use illegal drugs. Some items may interact with your medicine. What should I watch for while using this medication? Visit your doctor for checks on your progress. This drug may make you feel generally unwell. This is not uncommon, as chemotherapy can affect healthy cells as well as cancer cells. Report any side effects. Continue your course of treatment even though you feel ill unless your doctor tells you to stop. In some cases, you may be given additional medicines to help with side effects. Follow all directions for their use. Call your doctor or health care professional for advice if you get a fever, chills or sore throat, or other symptoms of a cold or flu. Do not treat yourself. This drug decreases your body's ability to fight infections. Try to avoid being around people who are sick. This medicine may increase your risk to bruise or bleed. Call your doctor or health care professional if you notice any unusual bleeding. Be careful brushing and flossing your teeth or using a toothpick because you may get an infection or bleed more easily. If you have any dental work done, tell your dentist you are receiving this medicine. Avoid taking products that contain aspirin, acetaminophen, ibuprofen, naproxen, or ketoprofen unless instructed by your doctor. These medicines may hide a fever. Do not become pregnant while taking this medicine. Women should inform their doctor if they wish to become pregnant or think they might be pregnant. There is a potential for serious side effects to an unborn child. Talk to your health care professional or pharmacist for more information. Do not breast-feed an infant while taking this medicine. Men should inform their doctor if they wish to father a child. This medicine may lower sperm counts. Do not treat diarrhea with over the counter  products. Contact your doctor if you have diarrhea that lasts more than 2 days or if it is severe and watery. This medicine can make you more sensitive to the sun. Keep out of the sun. If you  cannot avoid being in the sun, wear protective clothing and use sunscreen. Do not use sun lamps or tanning beds/booths. What side effects may I notice from receiving this medication? Side effects that you should report to your doctor or health care professional as soon as possible: allergic reactions like skin rash, itching or hives, swelling of the face, lips, or tongue low blood counts - this medicine may decrease the number of white blood cells, red blood cells and platelets. You may be at increased risk for infections and bleeding. signs of infection - fever or chills, cough, sore throat, pain or difficulty passing urine signs of decreased platelets or bleeding - bruising, pinpoint red spots on the skin, black, tarry stools, blood in the urine signs of decreased red blood cells - unusually weak or tired, fainting spells, lightheadedness breathing problems changes in vision chest pain mouth sores nausea and vomiting pain, swelling, redness at site where injected pain, tingling, numbness in the hands or feet redness, swelling, or sores on hands or feet stomach pain unusual bleeding Side effects that usually do not require medical attention (report to your doctor or health care professional if they continue or are bothersome): changes in finger or toe nails diarrhea dry or itchy skin hair loss headache loss of appetite sensitivity of eyes to the light stomach upset unusually teary eyes This list may not describe all possible side effects. Call your doctor for medical advice about side effects. You may report side effects to FDA at 1-800-FDA-1088. Where should I keep my medication? This drug is given in a hospital or clinic and will not be stored at home. NOTE: This sheet is a summary. It may not  cover all possible information. If you have questions about this medicine, talk to your doctor, pharmacist, or health care provider.  2023 [employer] Standard (2021-01-17 00:00:00)

## 2021-08-23 ENCOUNTER — Inpatient Hospital Stay: Payer: Medicaid Other

## 2021-08-23 ENCOUNTER — Other Ambulatory Visit: Payer: Self-pay

## 2021-08-23 VITALS — BP 144/88 | HR 98 | Temp 98.3°F

## 2021-08-23 DIAGNOSIS — C252 Malignant neoplasm of tail of pancreas: Secondary | ICD-10-CM

## 2021-08-23 DIAGNOSIS — Z5111 Encounter for antineoplastic chemotherapy: Secondary | ICD-10-CM | POA: Diagnosis not present

## 2021-08-23 MED ORDER — HEPARIN SOD (PORK) LOCK FLUSH 100 UNIT/ML IV SOLN
500.0000 [IU] | Freq: Once | INTRAVENOUS | Status: AC | PRN
  Administered 2021-08-23: 500 [IU]

## 2021-08-23 MED ORDER — SODIUM CHLORIDE 0.9% FLUSH
10.0000 mL | INTRAVENOUS | Status: DC | PRN
  Administered 2021-08-23: 10 mL

## 2021-08-30 ENCOUNTER — Other Ambulatory Visit: Payer: Self-pay | Admitting: Oncology

## 2021-09-01 ENCOUNTER — Inpatient Hospital Stay (HOSPITAL_BASED_OUTPATIENT_CLINIC_OR_DEPARTMENT_OTHER): Payer: Medicaid Other | Admitting: Nurse Practitioner

## 2021-09-01 ENCOUNTER — Encounter: Payer: Self-pay | Admitting: Nurse Practitioner

## 2021-09-01 ENCOUNTER — Inpatient Hospital Stay: Payer: Medicaid Other | Attending: Oncology

## 2021-09-01 ENCOUNTER — Encounter: Payer: Self-pay | Admitting: *Deleted

## 2021-09-01 ENCOUNTER — Inpatient Hospital Stay: Payer: Medicaid Other

## 2021-09-01 VITALS — BP 164/90 | HR 83 | Temp 98.1°F | Resp 18 | Ht 63.0 in | Wt 123.0 lb

## 2021-09-01 DIAGNOSIS — C787 Secondary malignant neoplasm of liver and intrahepatic bile duct: Secondary | ICD-10-CM | POA: Diagnosis not present

## 2021-09-01 DIAGNOSIS — D701 Agranulocytosis secondary to cancer chemotherapy: Secondary | ICD-10-CM | POA: Diagnosis not present

## 2021-09-01 DIAGNOSIS — C252 Malignant neoplasm of tail of pancreas: Secondary | ICD-10-CM | POA: Insufficient documentation

## 2021-09-01 DIAGNOSIS — Z5111 Encounter for antineoplastic chemotherapy: Secondary | ICD-10-CM | POA: Diagnosis not present

## 2021-09-01 LAB — CBC WITH DIFFERENTIAL (CANCER CENTER ONLY)
Abs Immature Granulocytes: 0.02 10*3/uL (ref 0.00–0.07)
Basophils Absolute: 0.1 10*3/uL (ref 0.0–0.1)
Basophils Relative: 1 %
Eosinophils Absolute: 0 10*3/uL (ref 0.0–0.5)
Eosinophils Relative: 1 %
HCT: 33.3 % — ABNORMAL LOW (ref 36.0–46.0)
Hemoglobin: 10.7 g/dL — ABNORMAL LOW (ref 12.0–15.0)
Immature Granulocytes: 0 %
Lymphocytes Relative: 38 %
Lymphs Abs: 1.9 10*3/uL (ref 0.7–4.0)
MCH: 28.2 pg (ref 26.0–34.0)
MCHC: 32.1 g/dL (ref 30.0–36.0)
MCV: 87.9 fL (ref 80.0–100.0)
Monocytes Absolute: 0.6 10*3/uL (ref 0.1–1.0)
Monocytes Relative: 12 %
Neutro Abs: 2.4 10*3/uL (ref 1.7–7.7)
Neutrophils Relative %: 48 %
Platelet Count: 207 10*3/uL (ref 150–400)
RBC: 3.79 MIL/uL — ABNORMAL LOW (ref 3.87–5.11)
RDW: 14.8 % (ref 11.5–15.5)
WBC Count: 4.9 10*3/uL (ref 4.0–10.5)
nRBC: 0 % (ref 0.0–0.2)

## 2021-09-01 LAB — CMP (CANCER CENTER ONLY)
ALT: 22 U/L (ref 0–44)
AST: 26 U/L (ref 15–41)
Albumin: 3.6 g/dL (ref 3.5–5.0)
Alkaline Phosphatase: 304 U/L — ABNORMAL HIGH (ref 38–126)
Anion gap: 11 (ref 5–15)
BUN: 7 mg/dL — ABNORMAL LOW (ref 8–23)
CO2: 26 mmol/L (ref 22–32)
Calcium: 10.3 mg/dL (ref 8.9–10.3)
Chloride: 102 mmol/L (ref 98–111)
Creatinine: 0.74 mg/dL (ref 0.44–1.00)
GFR, Estimated: >60 mL/min (ref >60)
Glucose, Bld: 154 mg/dL — ABNORMAL HIGH (ref 70–99)
Potassium: 3.8 mmol/L (ref 3.5–5.1)
Sodium: 139 mmol/L (ref 135–145)
Total Bilirubin: 0.5 mg/dL (ref 0.3–1.2)
Total Protein: 7.4 g/dL (ref 6.5–8.1)

## 2021-09-01 MED ORDER — DEXTROSE 5 % IV SOLN: Freq: Once | INTRAVENOUS | Status: AC

## 2021-09-01 MED ORDER — ATROPINE SULFATE 1 MG/ML IV SOLN
0.5000 mg | Freq: Once | INTRAVENOUS | Status: AC | PRN
  Administered 2021-09-01: 0.5 mg via INTRAVENOUS
  Filled 2021-09-01: qty 1

## 2021-09-01 MED ORDER — SODIUM CHLORIDE 0.9 % IV SOLN
150.0000 mg | Freq: Once | INTRAVENOUS | Status: AC
  Administered 2021-09-01: 150 mg via INTRAVENOUS
  Filled 2021-09-01: qty 150

## 2021-09-01 MED ORDER — SODIUM CHLORIDE 0.9 % IV SOLN
125.0000 mg/m2 | Freq: Once | INTRAVENOUS | Status: AC
  Administered 2021-09-01: 200 mg via INTRAVENOUS
  Filled 2021-09-01: qty 10

## 2021-09-01 MED ORDER — SODIUM CHLORIDE 0.9 % IV SOLN
10.0000 mg | Freq: Once | INTRAVENOUS | Status: AC
  Administered 2021-09-01: 10 mg via INTRAVENOUS
  Filled 2021-09-01: qty 10

## 2021-09-01 MED ORDER — PALONOSETRON HCL INJECTION 0.25 MG/5ML
0.2500 mg | Freq: Once | INTRAVENOUS | Status: AC
  Administered 2021-09-01: 0.25 mg via INTRAVENOUS
  Filled 2021-09-01: qty 5

## 2021-09-01 MED ORDER — SODIUM CHLORIDE 0.9 % IV SOLN
400.0000 mg/m2 | Freq: Once | INTRAVENOUS | Status: AC
  Administered 2021-09-01: 640 mg via INTRAVENOUS
  Filled 2021-09-01: qty 32

## 2021-09-01 MED ORDER — SODIUM CHLORIDE 0.9 % IV SOLN
2400.0000 mg/m2 | INTRAVENOUS | Status: DC
  Administered 2021-09-01: 3850 mg via INTRAVENOUS
  Filled 2021-09-01: qty 77

## 2021-09-01 MED ORDER — OXALIPLATIN CHEMO INJECTION 100 MG/20ML
40.0000 mg/m2 | Freq: Once | INTRAVENOUS | Status: AC
  Administered 2021-09-01: 65 mg via INTRAVENOUS
  Filled 2021-09-01: qty 13

## 2021-09-01 NOTE — Progress Notes (Signed)
[facility] OFFICE PROGRESS NOTE   Diagnosis: Pancreas cancer  INTERVAL HISTORY:   [name] returns as scheduled.  She completed cycle 3 FOLFIRINOX 08/21/2021.  She denies nausea/vomiting.  No mouth sores.  No diarrhea.  Neuropathy symptoms overall are stable though she does note less cold sensitivity.  Objective:  Vital signs in last 24 hours:  Blood pressure (!) 164/90, pulse 83, temperature 98.1 F (36.7 C), temperature source Oral, resp. rate 18, height '5\' 3"'$  (1.6 m), weight 123 lb (55.8 kg), SpO2 98 %.    HEENT: No thrush or ulcers. Resp: Lungs clear bilaterally. Cardio: Regular rate and rhythm. GI: Liver edge palpable, associated tenderness. Vascular: No leg edema. Neuro: Vibratory sense with moderate to severe decrease over the fingertips per tuning fork exam. Skin: Palms without erythema. Port-A-Cath without erythema.   Lab Results:  Lab Results  Component Value Date   WBC 4.9 09/01/2021   HGB 10.7 (L) 09/01/2021   HCT 33.3 (L) 09/01/2021   MCV 87.9 09/01/2021   PLT 207 09/01/2021   NEUTROABS 2.4 09/01/2021    Imaging:  No results found.  Medications: I have reviewed the patient's current medications.  Assessment/Plan: Metastatic pancreas cancer Right upper quadrant ultrasound 06/17/2020-multiple hypoechoic liver lesions CT abdomen/pelvis 06/17/2020- pancreas tail mass, multiple liver metastases, enlarged periaortic nodes, right ovary metastasis?,  Nodularity at the left paracolic gutter and posterior/inferior stomach suggesting peritoneal tumor spread CT chest 06/17/2020- scattered small pulmonary nodules consistent with metastatic disease Ultrasound-guided biopsy of a left liver mass 06/18/2020- adenocarcinoma, morphology compatible with metastatic pancreatic adenocarcinoma, MSS, tumor mutation burden 0 Cycle 1 FOLFOX 07/01/2020 Cycle 2 FOLFOX 07/16/2020 Cycle 3 FOLFOX 07/31/2020 Cycle 4 FOLFIRINOX 08/13/2020 Cycle 5 FOLFIRINOX 09/03/2020,  Udenyca CTs 09/13/2020-unchanged nodule medial left lower lobe; stable nodal and likely peritoneal disease in the abdomen/pelvis; unchanged pancreatic tail mass; multifocal hepatic metastases, mildly progressive Cycle 6 FOLFIRINOX 09/16/2020, Oxaliplatin held Cycle 7 FOLFIRINOX 09/30/2020 Cycle 8 FOLFIRINOX 10/14/2020, oxaliplatin held Cycle 9 FOLFIRINOX 10/28/2020, oxaliplatin held Cycle 10 FOLFIRINOX 11/11/2020, oxaliplatin held Cycle 11 FOLFIRINOX 11/25/2020, oxaliplatin held CTs 12/06/2020-primary pancreatic tail tumor decreased in size.  Widespread liver metastases all decreased.  Previously visualized peritoneal nodule adjacent to the distal stomach resolved.  Left lower lobe pulmonary nodule stable.  No new or progressive metastatic disease. Cycle 12 FOLFIRI 12/09/2020 Cycle 13 FOLFIRI 12/23/2020 Cycle 14 FOLFIRI 01/06/2021 Cycle 15 FOLFIRI 01/20/2021 Cycle 16 FOLFIRI 02/03/2021 Cycle 17 FOLFIRI 02/17/2021 CTs 02/27/2021-slight decrease in size of pancreas tail mass and hypodense liver lesions, unchanged left lower lobe nodule, enlargement of a single peritoneal nodule anterior to the distal descending colon Cycle 18 FOLFIRI 03/04/2021 Cycle 19 FOLFIRI 03/17/2021 Cycle 20 FOLFIRI 03/31/2021 Cycle 21 FOLFIRI 04/14/2021 CTs 04/24/2021-unchanged nodule left lower lobe.  Numerous hypodense liver lesions, a few of which are clearly increased in size.  Unchanged mass pancreatic tail. Cycle 1 gemcitabine/Abraxane 05/05/2021 Cycle 2 gemcitabine/Abraxane 05/19/2021 Cycle 3 gemcitabine/Abraxane 06/02/2021 Cycle 4 gemcitabine/Abraxane 06/16/2021 Cycle 5 gemcitabine/Abraxane 06/30/2021 CTs 07/11/2021-enlargement of pancreas tail mass and liver metastases, stable left lower lobe nodule, stable left lower quadrant omental nodule, stable bilateral ovarian lesions-not definitely simple cysts Cycle 1 FOLFIRINOX 07/21/2021 Cycle 2 FOLFIRINOX 08/05/2021 Cycle 3 FOLFIRINOX 08/21/2021, oxaliplatin dose reduced due to  neuropathy Cycle 4 FOLFIRINOX 09/01/2021 2.  Pain secondary to #1-improved 3.  Weight loss-improved 4.  Family history of pancreas cancer 5.  Neutropenia secondary chemotherapy-G-CSF added with cycle 5 FOLFIRINOX 6.  Oxaliplatin neuropathy-oxaliplatin held 09/16/2020, 10/14/2020, 10/28/2020, 11/11/2020, 11/25/2020  Disposition: [name] appears stable.  She has completed 3 cycles of FOLFIRINOX.  Oxaliplatin was dose reduced with cycle 3 due to neuropathy.  Symptoms overall stable.  Plan to proceed with cycle 4 today as scheduled.  We will follow-up on the CA 19-9.  CBC and chemistry panel reviewed.  Labs adequate to proceed with treatment.  She will return for lab, follow-up, cycle 5 FOLFIRINOX in 2 weeks.    Ned Card ANP/GNP-BC   09/01/2021  8:45 AM

## 2021-09-01 NOTE — Progress Notes (Signed)
Patient seen by Lisa Thomas NP today  Vitals are within treatment parameters.  Labs reviewed by Lisa Thomas NP and are within treatment parameters.  Per physician team, patient is ready for treatment and there are NO modifications to the treatment plan.     

## 2021-09-01 NOTE — Patient Instructions (Addendum)
[facility]   Discharge Instructions: Thank you for choosing [facility] to provide your oncology and hematology care.   If you have a lab appointment with the [facility], please go directly to the [facility] and check in at the registration area.   Wear comfortable clothing and clothing appropriate for easy access to any Portacath or PICC line.   We strive to give you quality time with your provider. You may need to reschedule your appointment if you arrive late (15 or more minutes).  Arriving late affects you and other patients whose appointments are after yours.  Also, if you miss three or more appointments without notifying the office, you may be dismissed from the clinic at the provider's discretion.      For prescription refill requests, have your pharmacy contact our office and allow 72 hours for refills to be completed.    Today you received the following chemotherapy and/or immunotherapy agents Oxaliplatin (ELOXATIN), Irinotecan (CAMPTOSAR), Leucovorin & Flourouracil (ADRUCIL).      To help prevent nausea and vomiting after your treatment, we encourage you to take your nausea medication as directed.  BELOW ARE SYMPTOMS THAT SHOULD BE REPORTED IMMEDIATELY: *FEVER GREATER THAN 100.4 F (38 C) OR HIGHER *CHILLS OR SWEATING *NAUSEA AND VOMITING THAT IS NOT CONTROLLED WITH YOUR NAUSEA MEDICATION *UNUSUAL SHORTNESS OF BREATH *UNUSUAL BRUISING OR BLEEDING *URINARY PROBLEMS (pain or burning when urinating, or frequent urination) *BOWEL PROBLEMS (unusual diarrhea, constipation, pain near the anus) TENDERNESS IN MOUTH AND THROAT WITH OR WITHOUT PRESENCE OF ULCERS (sore throat, sores in mouth, or a toothache) UNUSUAL RASH, SWELLING OR PAIN  UNUSUAL VAGINAL DISCHARGE OR ITCHING   Items with * indicate a potential emergency and should be followed up as soon as possible or go to the Emergency Department if any problems should occur.  Please  show the CHEMOTHERAPY ALERT CARD or IMMUNOTHERAPY ALERT CARD at check-in to the Emergency Department and triage nurse.  Should you have questions after your visit or need to cancel or reschedule your appointment, please contact [facility]  Dept: [phone]  and follow the prompts.  Office hours are 8:00 a.m. to 4:30 p.m. Monday - Friday. Please note that voicemails left after 4:00 p.m. may not be returned until the following business day.  We are closed weekends and major holidays. You have access to a nurse at all times for urgent questions. Please call the main number to the clinic Dept: [phone] and follow the prompts.   For any non-urgent questions, you may also contact your provider using MyChart. We now offer e-Visits for anyone 65 and older to request care online for non-urgent symptoms. For details visit [email or web address].   Also download the MyChart app! Go to the app store, search "MyChart", open the app, select [facility], and log in with your MyChart username and password.  Masks are optional in the cancer centers. If you would like for your care team to wear a mask while they are taking care of you, please let them know. For doctor visits, patients may have with them one support person who is at least 63 years old. At this time, visitors are not allowed in the infusion area.  Oxaliplatin Injection What is this medication? OXALIPLATIN (ox AL i PLA tin) is a chemotherapy drug. It targets fast dividing cells, like cancer cells, and causes these cells to die. This medicine is used to treat cancers of the colon and rectum,  and many other cancers. This medicine may be used for other purposes; ask your health care provider or pharmacist if you have questions. COMMON BRAND NAME(S): Eloxatin What should I tell my care team before I take this medication? They need to know if you have any of these conditions: heart disease history of irregular  heartbeat liver disease low blood counts, like white cells, platelets, or red blood cells lung or breathing disease, like asthma take medicines that treat or prevent blood clots tingling of the fingers or toes, or other nerve disorder an unusual or allergic reaction to oxaliplatin, other chemotherapy, other medicines, foods, dyes, or preservatives pregnant or trying to get pregnant breast-feeding How should I use this medication? This drug is given as an infusion into a vein. It is administered in a hospital or clinic by a specially trained health care professional. Talk to your pediatrician regarding the use of this medicine in children. Special care may be needed. Overdosage: If you think you have taken too much of this medicine contact a poison control center or emergency room at once. NOTE: This medicine is only for you. Do not share this medicine with others. What if I miss a dose? It is important not to miss a dose. Call your doctor or health care professional if you are unable to keep an appointment. What may interact with this medication? Do not take this medicine with any of the following medications: cisapride dronedarone pimozide thioridazine This medicine may also interact with the following medications: aspirin and aspirin-like medicines certain medicines that treat or prevent blood clots like warfarin, apixaban, dabigatran, and rivaroxaban cisplatin cyclosporine diuretics medicines for infection like acyclovir, adefovir, amphotericin B, bacitracin, cidofovir, foscarnet, ganciclovir, gentamicin, pentamidine, vancomycin NSAIDs, medicines for pain and inflammation, like ibuprofen or naproxen other medicines that prolong the QT interval (an abnormal heart rhythm) pamidronate zoledronic acid This list may not describe all possible interactions. Give your health care provider a list of all the medicines, herbs, non-prescription drugs, or dietary supplements you use. Also tell  them if you smoke, drink alcohol, or use illegal drugs. Some items may interact with your medicine. What should I watch for while using this medication? Your condition will be monitored carefully while you are receiving this medicine. You may need blood work done while you are taking this medicine. This medicine may make you feel generally unwell. This is not uncommon as chemotherapy can affect healthy cells as well as cancer cells. Report any side effects. Continue your course of treatment even though you feel ill unless your healthcare professional tells you to stop. This medicine can make you more sensitive to cold. Do not drink cold drinks or use ice. Cover exposed skin before coming in contact with cold temperatures or cold objects. When out in cold weather wear warm clothing and cover your mouth and nose to warm the air that goes into your lungs. Tell your doctor if you get sensitive to the cold. Do not become pregnant while taking this medicine or for 9 months after stopping it. Women should inform their health care professional if they wish to become pregnant or think they might be pregnant. Men should not father a child while taking this medicine and for 6 months after stopping it. There is potential for serious side effects to an unborn child. Talk to your health care professional for more information. Do not breast-feed a child while taking this medicine or for 3 months after stopping it. This medicine has caused ovarian failure  in some women. This medicine may make it more difficult to get pregnant. Talk to your health care professional if you are concerned about your fertility. This medicine has caused decreased sperm counts in some men. This may make it more difficult to father a child. Talk to your health care professional if you are concerned about your fertility. This medicine may increase your risk of getting an infection. Call your health care professional for advice if you get a fever,  chills, or sore throat, or other symptoms of a cold or flu. Do not treat yourself. Try to avoid being around people who are sick. Avoid taking medicines that contain aspirin, acetaminophen, ibuprofen, naproxen, or ketoprofen unless instructed by your health care professional. These medicines may hide a fever. Be careful brushing or flossing your teeth or using a toothpick because you may get an infection or bleed more easily. If you have any dental work done, tell your dentist you are receiving this medicine. What side effects may I notice from receiving this medication? Side effects that you should report to your doctor or health care professional as soon as possible: allergic reactions like skin rash, itching or hives, swelling of the face, lips, or tongue breathing problems cough low blood counts - this medicine may decrease the number of white blood cells, red blood cells, and platelets. You may be at increased risk for infections and bleeding nausea, vomiting pain, redness, or irritation at site where injected pain, tingling, numbness in the hands or feet signs and symptoms of bleeding such as bloody or black, tarry stools; red or dark brown urine; spitting up blood or brown material that looks like coffee grounds; red spots on the skin; unusual bruising or bleeding from the eyes, gums, or nose signs and symptoms of a dangerous change in heartbeat or heart rhythm like chest pain; dizziness; fast, irregular heartbeat; palpitations; feeling faint or lightheaded; falls signs and symptoms of infection like fever; chills; cough; sore throat; pain or trouble passing urine signs and symptoms of liver injury like dark yellow or brown urine; general ill feeling or flu-like symptoms; light-colored stools; loss of appetite; nausea; right upper belly pain; unusually weak or tired; yellowing of the eyes or skin signs and symptoms of low red blood cells or anemia such as unusually weak or tired; feeling faint  or lightheaded; falls signs and symptoms of muscle injury like dark urine; trouble passing urine or change in the amount of urine; unusually weak or tired; muscle pain; back pain Side effects that usually do not require medical attention (report to your doctor or health care professional if they continue or are bothersome): changes in taste diarrhea gas hair loss loss of appetite mouth sores This list may not describe all possible side effects. Call your doctor for medical advice about side effects. You may report side effects to FDA at 1-800-FDA-1088. Where should I keep my medication? This drug is given in a hospital or clinic and will not be stored at home. NOTE: This sheet is a summary. It may not cover all possible information. If you have questions about this medicine, talk to your doctor, pharmacist, or health care provider.  2023 Elsevier/Gold Standard (2021-01-17 00:00:00) Irinotecan injection What is this medication? IRINOTECAN (ir in oh TEE kan ) is a chemotherapy drug. It is used to treat colon and rectal cancer. This medicine may be used for other purposes; ask your health care provider or pharmacist if you have questions. COMMON BRAND NAME(S): Camptosar What should  I tell my care team before I take this medication? They need to know if you have any of these conditions: dehydration diarrhea infection (especially a virus infection such as chickenpox, cold sores, or herpes) liver disease low blood counts, like low white cell, platelet, or red cell counts low levels of calcium, magnesium, or potassium in the blood recent or ongoing radiation therapy an unusual or allergic reaction to irinotecan, other medicines, foods, dyes, or preservatives pregnant or trying to get pregnant breast-feeding How should I use this medication? This drug is given as an infusion into a vein. It is administered in a hospital or clinic by a specially trained health care professional. Talk to your  pediatrician regarding the use of this medicine in children. Special care may be needed. Overdosage: If you think you have taken too much of this medicine contact a poison control center or emergency room at once. NOTE: This medicine is only for you. Do not share this medicine with others. What if I miss a dose? It is important not to miss your dose. Call your doctor or health care professional if you are unable to keep an appointment. What may interact with this medication? Do not take this medicine with any of the following medications: cobicistat itraconazole This medicine may interact with the following medications: antiviral medicines for HIV or AIDS certain antibiotics like rifampin or rifabutin certain medicines for fungal infections like ketoconazole, posaconazole, and voriconazole certain medicines for seizures like carbamazepine, phenobarbital, phenotoin clarithromycin gemfibrozil nefazodone St. John's Wort This list may not describe all possible interactions. Give your health care provider a list of all the medicines, herbs, non-prescription drugs, or dietary supplements you use. Also tell them if you smoke, drink alcohol, or use illegal drugs. Some items may interact with your medicine. What should I watch for while using this medication? Your condition will be monitored carefully while you are receiving this medicine. You will need important blood work done while you are taking this medicine. This drug may make you feel generally unwell. This is not uncommon, as chemotherapy can affect healthy cells as well as cancer cells. Report any side effects. Continue your course of treatment even though you feel ill unless your doctor tells you to stop. In some cases, you may be given additional medicines to help with side effects. Follow all directions for their use. You may get drowsy or dizzy. Do not drive, use machinery, or do anything that needs mental alertness until you know how this  medicine affects you. Do not stand or sit up quickly, especially if you are an older patient. This reduces the risk of dizzy or fainting spells. Call your health care professional for advice if you get a fever, chills, or sore throat, or other symptoms of a cold or flu. Do not treat yourself. This medicine decreases your body's ability to fight infections. Try to avoid being around people who are sick. Avoid taking products that contain aspirin, acetaminophen, ibuprofen, naproxen, or ketoprofen unless instructed by your doctor. These medicines may hide a fever. This medicine may increase your risk to bruise or bleed. Call your doctor or health care professional if you notice any unusual bleeding. Be careful brushing and flossing your teeth or using a toothpick because you may get an infection or bleed more easily. If you have any dental work done, tell your dentist you are receiving this medicine. Do not become pregnant while taking this medicine or for 6 months after stopping it. Women should inform  their health care professional if they wish to become pregnant or think they might be pregnant. Men should not father a child while taking this medicine and for 3 months after stopping it. There is potential for serious side effects to an unborn child. Talk to your health care professional for more information. Do not breast-feed an infant while taking this medicine or for 7 days after stopping it. This medicine has caused ovarian failure in some women. This medicine may make it more difficult to get pregnant. Talk to your health care professional if you are concerned about your fertility. This medicine has caused decreased sperm counts in some men. This may make it more difficult to father a child. Talk to your health care professional if you are concerned about your fertility. What side effects may I notice from receiving this medication? Side effects that you should report to your doctor or health care  professional as soon as possible: allergic reactions like skin rash, itching or hives, swelling of the face, lips, or tongue chest pain diarrhea flushing, runny nose, sweating during infusion low blood counts - this medicine may decrease the number of white blood cells, red blood cells and platelets. You may be at increased risk for infections and bleeding. nausea, vomiting pain, swelling, warmth in the leg signs of decreased platelets or bleeding - bruising, pinpoint red spots on the skin, black, tarry stools, blood in the urine signs of infection - fever or chills, cough, sore throat, pain or difficulty passing urine signs of decreased red blood cells - unusually weak or tired, fainting spells, lightheadedness Side effects that usually do not require medical attention (report to your doctor or health care professional if they continue or are bothersome): constipation hair loss headache loss of appetite mouth sores stomach pain This list may not describe all possible side effects. Call your doctor for medical advice about side effects. You may report side effects to FDA at 1-800-FDA-1088. Where should I keep my medication? This drug is given in a hospital or clinic and will not be stored at home. NOTE: This sheet is a summary. It may not cover all possible information. If you have questions about this medicine, talk to your doctor, pharmacist, or health care provider.  2023 Elsevier/Gold Standard (2021-01-17 00:00:00) Leucovorin injection What is this medication? LEUCOVORIN (loo koe VOR in) is used to prevent or treat the harmful effects of some medicines. This medicine is used to treat anemia caused by a low amount of folic acid in the body. It is also used with 5-fluorouracil (5-FU) to treat colon cancer. This medicine may be used for other purposes; ask your health care provider or pharmacist if you have questions. What should I tell my care team before I take this medication? They  need to know if you have any of these conditions: anemia from low levels of vitamin B-12 in the blood an unusual or allergic reaction to leucovorin, folic acid, other medicines, foods, dyes, or preservatives pregnant or trying to get pregnant breast-feeding How should I use this medication? This medicine is for injection into a muscle or into a vein. It is given by a health care professional in a hospital or clinic setting. Talk to your pediatrician regarding the use of this medicine in children. Special care may be needed. Overdosage: If you think you have taken too much of this medicine contact a poison control center or emergency room at once. NOTE: This medicine is only for you. Do not share this  medicine with others. What if I miss a dose? This does not apply. What may interact with this medication? capecitabine fluorouracil phenobarbital phenytoin primidone trimethoprim-sulfamethoxazole This list may not describe all possible interactions. Give your health care provider a list of all the medicines, herbs, non-prescription drugs, or dietary supplements you use. Also tell them if you smoke, drink alcohol, or use illegal drugs. Some items may interact with your medicine. What should I watch for while using this medication? Your condition will be monitored carefully while you are receiving this medicine. This medicine may increase the side effects of 5-fluorouracil, 5-FU. Tell your doctor or health care professional if you have diarrhea or mouth sores that do not get better or that get worse. What side effects may I notice from receiving this medication? Side effects that you should report to your doctor or health care professional as soon as possible: allergic reactions like skin rash, itching or hives, swelling of the face, lips, or tongue breathing problems fever, infection mouth sores unusual bleeding or bruising unusually weak or tired Side effects that usually do not require  medical attention (report to your doctor or health care professional if they continue or are bothersome): constipation or diarrhea loss of appetite nausea, vomiting This list may not describe all possible side effects. Call your doctor for medical advice about side effects. You may report side effects to FDA at 1-800-FDA-1088. Where should I keep my medication? This drug is given in a hospital or clinic and will not be stored at home. NOTE: This sheet is a summary. It may not cover all possible information. If you have questions about this medicine, talk to your doctor, pharmacist, or health care provider.  2023 Elsevier/Gold Standard (2007-08-25 00:00:00) Fluorouracil, 5-FU injection What is this medication? FLUOROURACIL, 5-FU (flure oh YOOR a sil) is a chemotherapy drug. It slows the growth of cancer cells. This medicine is used to treat many types of cancer like breast cancer, colon or rectal cancer, pancreatic cancer, and stomach cancer. This medicine may be used for other purposes; ask your health care provider or pharmacist if you have questions. COMMON BRAND NAME(S): Adrucil What should I tell my care team before I take this medication? They need to know if you have any of these conditions: blood disorders dihydropyrimidine dehydrogenase (DPD) deficiency infection (especially a virus infection such as chickenpox, cold sores, or herpes) kidney disease liver disease malnourished, poor nutrition recent or ongoing radiation therapy an unusual or allergic reaction to fluorouracil, other chemotherapy, other medicines, foods, dyes, or preservatives pregnant or trying to get pregnant breast-feeding How should I use this medication? This drug is given as an infusion or injection into a vein. It is administered in a hospital or clinic by a specially trained health care professional. Talk to your pediatrician regarding the use of this medicine in children. Special care may be  needed. Overdosage: If you think you have taken too much of this medicine contact a poison control center or emergency room at once. NOTE: This medicine is only for you. Do not share this medicine with others. What if I miss a dose? It is important not to miss your dose. Call your doctor or health care professional if you are unable to keep an appointment. What may interact with this medication? Do not take this medicine with any of the following medications: live virus vaccines This medicine may also interact with the following medications: medicines that treat or prevent blood clots like warfarin, enoxaparin, and dalteparin This  list may not describe all possible interactions. Give your health care provider a list of all the medicines, herbs, non-prescription drugs, or dietary supplements you use. Also tell them if you smoke, drink alcohol, or use illegal drugs. Some items may interact with your medicine. What should I watch for while using this medication? Visit your doctor for checks on your progress. This drug may make you feel generally unwell. This is not uncommon, as chemotherapy can affect healthy cells as well as cancer cells. Report any side effects. Continue your course of treatment even though you feel ill unless your doctor tells you to stop. In some cases, you may be given additional medicines to help with side effects. Follow all directions for their use. Call your doctor or health care professional for advice if you get a fever, chills or sore throat, or other symptoms of a cold or flu. Do not treat yourself. This drug decreases your body's ability to fight infections. Try to avoid being around people who are sick. This medicine may increase your risk to bruise or bleed. Call your doctor or health care professional if you notice any unusual bleeding. Be careful brushing and flossing your teeth or using a toothpick because you may get an infection or bleed more easily. If you have any  dental work done, tell your dentist you are receiving this medicine. Avoid taking products that contain aspirin, acetaminophen, ibuprofen, naproxen, or ketoprofen unless instructed by your doctor. These medicines may hide a fever. Do not become pregnant while taking this medicine. Women should inform their doctor if they wish to become pregnant or think they might be pregnant. There is a potential for serious side effects to an unborn child. Talk to your health care professional or pharmacist for more information. Do not breast-feed an infant while taking this medicine. Men should inform their doctor if they wish to father a child. This medicine may lower sperm counts. Do not treat diarrhea with over the counter products. Contact your doctor if you have diarrhea that lasts more than 2 days or if it is severe and watery. This medicine can make you more sensitive to the sun. Keep out of the sun. If you cannot avoid being in the sun, wear protective clothing and use sunscreen. Do not use sun lamps or tanning beds/booths. What side effects may I notice from receiving this medication? Side effects that you should report to your doctor or health care professional as soon as possible: allergic reactions like skin rash, itching or hives, swelling of the face, lips, or tongue low blood counts - this medicine may decrease the number of white blood cells, red blood cells and platelets. You may be at increased risk for infections and bleeding. signs of infection - fever or chills, cough, sore throat, pain or difficulty passing urine signs of decreased platelets or bleeding - bruising, pinpoint red spots on the skin, black, tarry stools, blood in the urine signs of decreased red blood cells - unusually weak or tired, fainting spells, lightheadedness breathing problems changes in vision chest pain mouth sores nausea and vomiting pain, swelling, redness at site where injected pain, tingling, numbness in the  hands or feet redness, swelling, or sores on hands or feet stomach pain unusual bleeding Side effects that usually do not require medical attention (report to your doctor or health care professional if they continue or are bothersome): changes in finger or toe nails diarrhea dry or itchy skin hair loss headache loss of appetite sensitivity of  eyes to the light stomach upset unusually teary eyes This list may not describe all possible side effects. Call your doctor for medical advice about side effects. You may report side effects to FDA at 1-800-FDA-1088. Where should I keep my medication? This drug is given in a hospital or clinic and will not be stored at home. NOTE: This sheet is a summary. It may not cover all possible information. If you have questions about this medicine, talk to your doctor, pharmacist, or health care provider.  2023 Elsevier/Gold Standard (2021-01-17 00:00:00) The chemotherapy medication bag should finish at 46 hours, 96 hours, or 7 days. For example, if your pump is scheduled for 46 hours and it was put on at 4:00 p.m., it should finish at 2:00 p.m. the day it is scheduled to come off regardless of your appointment time.     Estimated time to finish at 12:40 p.m. on Wednesday 09/03/2021.   If the display on your pump reads "Low Volume" and it is beeping, take the batteries out of the pump and come to the cancer center for it to be taken off.   If the pump alarms go off prior to the pump reading "Low Volume" then call [phone] and someone can assist you.  If the plunger comes out and the chemotherapy medication is leaking out, please use your home chemo spill kit to clean up the spill. Do NOT use paper towels or other household products.  If you have problems or questions regarding your pump, please call either [phone] (24 hours a day) or the cancer center Monday-Friday 8:00 a.m.- 4:30 p.m. at the clinic number and we will assist you. If you are  unable to get assistance, then go to the nearest Emergency Department and ask the staff to contact the IV team for assistance.

## 2021-09-03 ENCOUNTER — Inpatient Hospital Stay: Payer: Medicaid Other

## 2021-09-03 VITALS — BP 141/89 | HR 90 | Temp 98.7°F | Resp 20

## 2021-09-03 DIAGNOSIS — C252 Malignant neoplasm of tail of pancreas: Secondary | ICD-10-CM

## 2021-09-03 DIAGNOSIS — Z5111 Encounter for antineoplastic chemotherapy: Secondary | ICD-10-CM | POA: Diagnosis not present

## 2021-09-03 LAB — CANCER ANTIGEN 19-9: CA 19-9: 40584 U/mL — ABNORMAL HIGH (ref 0–35)

## 2021-09-03 MED ORDER — SODIUM CHLORIDE 0.9% FLUSH
10.0000 mL | INTRAVENOUS | Status: DC | PRN
  Administered 2021-09-03: 10 mL

## 2021-09-03 MED ORDER — HEPARIN SOD (PORK) LOCK FLUSH 100 UNIT/ML IV SOLN
500.0000 [IU] | Freq: Once | INTRAVENOUS | Status: AC | PRN
  Administered 2021-09-03: 500 [IU]

## 2021-09-03 NOTE — Patient Instructions (Signed)
Heparin injection ?What is this medication? ?HEPARIN (HEP a rin) is an anticoagulant. It is used to treat or prevent clots in the veins, arteries, lungs, or heart. It stops clots from forming or getting bigger. This medicine prevents clotting during open-heart surgery, dialysis, or in patients who are confined to bed. ?This medicine may be used for other purposes; ask your health care provider or pharmacist if you have questions. ?COMMON BRAND NAME(S): Hep-Lock, Hep-Lock U/P, Hepflush-10, Monoject Prefill Advanced Heparin Lock Flush, SASH Normal Saline and Heparin ?What should I tell my care team before I take this medication? ?They need to know if you have any of these conditions: ?bleeding disorders, such as hemophilia or low blood platelets ?bowel disease or diverticulitis ?endocarditis ?high blood pressure ?liver disease ?recent surgery or delivery of a baby ?stomach ulcers ?an unusual or allergic reaction to heparin, benzyl alcohol, sulfites, other medicines, foods, dyes, or preservatives ?pregnant or trying to get pregnant ?breast-feeding ?How should I use this medication? ?This medicine is given by injection or infusion into a vein. It can also be given by injection of small amounts under the skin. It is usually given by a health care professional in a hospital or clinic setting. ?If you get this medicine at home, you will be taught how to prepare and give this medicine. Use exactly as directed. Take your medicine at regular intervals. Do not take it more often than directed. Do not stop taking except on your doctor's advice. Stopping this medicine may increase your risk of a blot clot. Be sure to refill your prescription before you run out of medicine. ?It is important that you put your used needles and syringes in a special sharps container. Do not put them in a trash can. If you do not have a sharps container, call your pharmacist or healthcare provider to get one. ?Talk to your pediatrician regarding the  use of this medicine in children. While this medicine may be prescribed for children for selected conditions, precautions do apply. ?Overdosage: If you think you have taken too much of this medicine contact a poison control center or emergency room at once. ?NOTE: This medicine is only for you. Do not share this medicine with others. ?What if I miss a dose? ?If you miss a dose, take it as soon as you can. If it is almost time for your next dose, take only that dose. Do not take double or extra doses. ?What may interact with this medication? ?Do not take this medicine with any of the following medications: ?aspirin and aspirin-like drugs ?mifepristone ?medicines that treat or prevent blood clots like warfarin, enoxaparin, and dalteparin ?palifermin ?protamine ?This medicine may also interact with the following medications: ?dextran ?digoxin ?hydroxychloroquine ?medicines for treating colds or allergies ?nicotine ?NSAIDs, medicines for pain and inflammation, like ibuprofen or naproxen ?phenylbutazone ?tetracycline antibiotics ?This list may not describe all possible interactions. Give your health care provider a list of all the medicines, herbs, non-prescription drugs, or dietary supplements you use. Also tell them if you smoke, drink alcohol, or use illegal drugs. Some items may interact with your medicine. ?What should I watch for while using this medication? ?Visit your healthcare professional for regular checks on your progress. You may need blood work done while you are taking this medicine. Your condition will be monitored carefully while you are receiving this medicine. It is important not to miss any appointments. ?Wear a medical ID bracelet or chain, and carry a card that describes your disease and details   of your medicine and dosage times. ?Notify your doctor or healthcare professional at once if you have cold, blue hands or feet. ?If you are going to need surgery or other procedure, tell your healthcare  professional that you are using this medicine. ?Avoid sports and activities that might cause injury while you are using this medicine. Severe falls or injuries can cause unseen bleeding. Be careful when using sharp tools or knives. Consider using an electric razor. Take special care brushing or flossing your teeth. Report any injuries, bruising, or red spots on the skin to your healthcare professional. ?Using this medicine for a long time may weaken your bones and increase the risk of bone fractures. ?You should make sure that you get enough calcium and vitamin D while you are taking this medicine. Discuss the foods you eat and the vitamins you take with your healthcare professional. ?Wear a medical ID bracelet or chain. Carry a card that describes your disease and details of your medicine and dosage times. ?What side effects may I notice from receiving this medication? ?Side effects that you should report to your doctor or health care professional as soon as possible: ?allergic reactions like skin rash, itching or hives, swelling of the face, lips, or tongue ?bone pain ?fever, chills ?nausea, vomiting ?signs and symptoms of bleeding such as bloody or black, tarry stools; red or dark-brown urine; spitting up blood or brown material that looks like coffee grounds; red spots on the skin; unusual bruising or bleeding from the eye, gums, or nose ?signs and symptoms of a blood clot such as chest pain; shortness of breath; pain, swelling, or warmth in the leg ?signs and symptoms of a stroke such as changes in vision; confusion; trouble speaking or understanding; severe headaches; sudden numbness or weakness of the face, arm or leg; trouble walking; dizziness; loss of coordination ?Side effects that usually do not require medical attention (report to your doctor or health care professional if they continue or are bothersome): ?hair loss ?pain, redness, or irritation at site where injected ?This list may not describe all  possible side effects. Call your doctor for medical advice about side effects. You may report side effects to FDA at 1-800-FDA-1088. ?Where should I keep my medication? ?Keep out of the reach of children. ?Store unopened vials at room temperature between 15 and 30 degrees C (59 and 86 degrees F). Do not freeze. Do not use if solution is discolored or particulate matter is present. Throw away any unused medicine after the expiration date. ?NOTE: This sheet is a summary. It may not cover all possible information. If you have questions about this medicine, talk to your doctor, pharmacist, or health care provider. ?? 2023 Elsevier/Gold Standard (2020-03-28 00:00:00) ? ?

## 2021-09-14 ENCOUNTER — Other Ambulatory Visit: Payer: Self-pay | Admitting: Oncology

## 2021-09-15 ENCOUNTER — Inpatient Hospital Stay: Payer: Medicaid Other

## 2021-09-15 ENCOUNTER — Inpatient Hospital Stay (HOSPITAL_BASED_OUTPATIENT_CLINIC_OR_DEPARTMENT_OTHER): Payer: Medicaid Other | Admitting: Oncology

## 2021-09-15 ENCOUNTER — Encounter: Payer: Self-pay | Admitting: *Deleted

## 2021-09-15 VITALS — BP 157/87 | HR 92 | Temp 98.2°F | Resp 18 | Ht 63.0 in | Wt 123.0 lb

## 2021-09-15 DIAGNOSIS — Z95828 Presence of other vascular implants and grafts: Secondary | ICD-10-CM

## 2021-09-15 DIAGNOSIS — C787 Secondary malignant neoplasm of liver and intrahepatic bile duct: Secondary | ICD-10-CM

## 2021-09-15 DIAGNOSIS — C252 Malignant neoplasm of tail of pancreas: Secondary | ICD-10-CM

## 2021-09-15 DIAGNOSIS — Z5111 Encounter for antineoplastic chemotherapy: Secondary | ICD-10-CM | POA: Diagnosis not present

## 2021-09-15 LAB — CBC WITH DIFFERENTIAL (CANCER CENTER ONLY)
Abs Immature Granulocytes: 0.01 10*3/uL (ref 0.00–0.07)
Basophils Absolute: 0 10*3/uL (ref 0.0–0.1)
Basophils Relative: 1 %
Eosinophils Absolute: 0.1 10*3/uL (ref 0.0–0.5)
Eosinophils Relative: 2 %
HCT: 31.9 % — ABNORMAL LOW (ref 36.0–46.0)
Hemoglobin: 10.2 g/dL — ABNORMAL LOW (ref 12.0–15.0)
Immature Granulocytes: 0 %
Lymphocytes Relative: 36 %
Lymphs Abs: 1.7 10*3/uL (ref 0.7–4.0)
MCH: 28.6 pg (ref 26.0–34.0)
MCHC: 32 g/dL (ref 30.0–36.0)
MCV: 89.4 fL (ref 80.0–100.0)
Monocytes Absolute: 0.7 10*3/uL (ref 0.1–1.0)
Monocytes Relative: 15 %
Neutro Abs: 2.2 10*3/uL (ref 1.7–7.7)
Neutrophils Relative %: 46 %
Platelet Count: 265 10*3/uL (ref 150–400)
RBC: 3.57 MIL/uL — ABNORMAL LOW (ref 3.87–5.11)
RDW: 15.8 % — ABNORMAL HIGH (ref 11.5–15.5)
WBC Count: 4.7 10*3/uL (ref 4.0–10.5)
nRBC: 0 % (ref 0.0–0.2)

## 2021-09-15 LAB — CMP (CANCER CENTER ONLY)
ALT: 10 U/L (ref 0–44)
AST: 26 U/L (ref 15–41)
Albumin: 3.4 g/dL — ABNORMAL LOW (ref 3.5–5.0)
Alkaline Phosphatase: 308 U/L — ABNORMAL HIGH (ref 38–126)
Anion gap: 7 (ref 5–15)
BUN: 5 mg/dL — ABNORMAL LOW (ref 8–23)
CO2: 26 mmol/L (ref 22–32)
Calcium: 9.4 mg/dL (ref 8.9–10.3)
Chloride: 100 mmol/L (ref 98–111)
Creatinine: 0.63 mg/dL (ref 0.44–1.00)
GFR, Estimated: >60 mL/min (ref >60)
Glucose, Bld: 98 mg/dL (ref 70–99)
Potassium: 3.4 mmol/L — ABNORMAL LOW (ref 3.5–5.1)
Sodium: 133 mmol/L — ABNORMAL LOW (ref 135–145)
Total Bilirubin: 0.4 mg/dL (ref 0.3–1.2)
Total Protein: 6.8 g/dL (ref 6.5–8.1)

## 2021-09-15 MED ORDER — SODIUM CHLORIDE 0.9 % IV SOLN
2400.0000 mg/m2 | INTRAVENOUS | Status: DC
  Administered 2021-09-15: 3750 mg via INTRAVENOUS
  Filled 2021-09-15: qty 75

## 2021-09-15 MED ORDER — PALONOSETRON HCL INJECTION 0.25 MG/5ML
0.2500 mg | Freq: Once | INTRAVENOUS | Status: AC
  Administered 2021-09-15: 0.25 mg via INTRAVENOUS
  Filled 2021-09-15: qty 5

## 2021-09-15 MED ORDER — ATROPINE SULFATE 1 MG/ML IV SOLN
0.5000 mg | Freq: Once | INTRAVENOUS | Status: AC | PRN
  Administered 2021-09-15: 0.5 mg via INTRAVENOUS
  Filled 2021-09-15: qty 1

## 2021-09-15 MED ORDER — SODIUM CHLORIDE 0.9 % IV SOLN
400.0000 mg/m2 | Freq: Once | INTRAVENOUS | Status: AC
  Administered 2021-09-15: 628 mg via INTRAVENOUS
  Filled 2021-09-15: qty 31.4

## 2021-09-15 MED ORDER — SODIUM CHLORIDE 0.9 % IV SOLN
10.0000 mg | Freq: Once | INTRAVENOUS | Status: AC
  Administered 2021-09-15: 10 mg via INTRAVENOUS
  Filled 2021-09-15: qty 10

## 2021-09-15 MED ORDER — OXALIPLATIN CHEMO INJECTION 100 MG/20ML
40.0000 mg/m2 | Freq: Once | INTRAVENOUS | Status: AC
  Administered 2021-09-15: 65 mg via INTRAVENOUS
  Filled 2021-09-15: qty 13

## 2021-09-15 MED ORDER — DEXTROSE 5 % IV SOLN: Freq: Once | INTRAVENOUS | Status: AC

## 2021-09-15 MED ORDER — SODIUM CHLORIDE 0.9 % IV SOLN
125.0000 mg/m2 | Freq: Once | INTRAVENOUS | Status: AC
  Administered 2021-09-15: 200 mg via INTRAVENOUS
  Filled 2021-09-15: qty 10

## 2021-09-15 MED ORDER — HEPARIN SOD (PORK) LOCK FLUSH 100 UNIT/ML IV SOLN
500.0000 [IU] | Freq: Once | INTRAVENOUS | Status: AC
  Administered 2021-09-15: 500 [IU] via INTRAVENOUS

## 2021-09-15 MED ORDER — SODIUM CHLORIDE 0.9 % IV SOLN
150.0000 mg | Freq: Once | INTRAVENOUS | Status: AC
  Administered 2021-09-15: 150 mg via INTRAVENOUS
  Filled 2021-09-15: qty 150

## 2021-09-15 MED ORDER — SODIUM CHLORIDE 0.9% FLUSH
10.0000 mL | Freq: Once | INTRAVENOUS | Status: AC
  Administered 2021-09-15: 10 mL via INTRAVENOUS

## 2021-09-15 NOTE — Progress Notes (Signed)
Patient seen by Dr. Sherrill today ? ?Vitals are within treatment parameters. ? ?Labs reviewed by Dr. Sherrill and are within treatment parameters. ? ?Per physician team, patient is ready for treatment and there are NO modifications to the treatment plan.  ?

## 2021-09-15 NOTE — Progress Notes (Signed)
[place] OFFICE PROGRESS NOTE   Diagnosis: Pancreas cancer  INTERVAL HISTORY:   Kirsten [name] completed on cycle FOLFIRINOX 09/01/2021.  No nausea, vomiting, mouth sores, or diarrhea.  She has cold sensitivity for 1 week following chemotherapy.  She has mild peripheral numbness and tingling.  This does not interfere with activity.  Objective:  Vital signs in last 24 hours:  Blood pressure (!) 157/87, pulse 92, temperature 98.2 F (36.8 C), temperature source Oral, resp. rate 18, height '5\' 3"'$  (1.6 m), weight 123 lb (55.8 kg), SpO2 100 %.    HEENT: No thrush or ulcers, hyperpigmentation Resp: Lungs clear bilaterally Cardio: Regular rate and rhythm GI: No hepatosplenomegaly, no mass Vascular: No leg edema Neuro: Moderate loss of vibratory sense at the fingertip bilaterally    Portacath/PICC-without erythema  Lab Results:  Lab Results  Component Value Date   WBC 4.7 09/15/2021   HGB 10.2 (L) 09/15/2021   HCT 31.9 (L) 09/15/2021   MCV 89.4 09/15/2021   PLT 265 09/15/2021   NEUTROABS 2.2 09/15/2021    CMP  Lab Results  Component Value Date   NA 133 (L) 09/15/2021   K 3.4 (L) 09/15/2021   CL 100 09/15/2021   CO2 26 09/15/2021   GLUCOSE 98 09/15/2021   BUN 5 (L) 09/15/2021   CREATININE 0.63 09/15/2021   CALCIUM 9.4 09/15/2021   PROT 6.8 09/15/2021   ALBUMIN 3.4 (L) 09/15/2021   AST 26 09/15/2021   ALT 10 09/15/2021   ALKPHOS 308 (H) 09/15/2021   BILITOT 0.4 09/15/2021   GFRNONAA >60 09/15/2021    Lab Results  Component Value Date   KZS010 40,584 (H) 09/01/2021    Medications: I have reviewed the patient's current medications.   Assessment/Plan:  Metastatic pancreas cancer Right upper quadrant ultrasound 06/17/2020-multiple hypoechoic liver lesions CT abdomen/pelvis 06/17/2020- pancreas tail mass, multiple liver metastases, enlarged periaortic nodes, right ovary metastasis?,  Nodularity at the left paracolic gutter and posterior/inferior  stomach suggesting peritoneal tumor spread CT chest 06/17/2020- scattered small pulmonary nodules consistent with metastatic disease Ultrasound-guided biopsy of a left liver mass 06/18/2020- adenocarcinoma, morphology compatible with metastatic pancreatic adenocarcinoma, MSS, tumor mutation burden 0 Cycle 1 FOLFOX 07/01/2020 Cycle 2 FOLFOX 07/16/2020 Cycle 3 FOLFOX 07/31/2020 Cycle 4 FOLFIRINOX 08/13/2020 Cycle 5 FOLFIRINOX 09/03/2020, Udenyca CTs 09/13/2020-unchanged nodule medial left lower lobe; stable nodal and likely peritoneal disease in the abdomen/pelvis; unchanged pancreatic tail mass; multifocal hepatic metastases, mildly progressive Cycle 6 FOLFIRINOX 09/16/2020, Oxaliplatin held Cycle 7 FOLFIRINOX 09/30/2020 Cycle 8 FOLFIRINOX 10/14/2020, oxaliplatin held Cycle 9 FOLFIRINOX 10/28/2020, oxaliplatin held Cycle 10 FOLFIRINOX 11/11/2020, oxaliplatin held Cycle 11 FOLFIRINOX 11/25/2020, oxaliplatin held CTs 12/06/2020-primary pancreatic tail tumor decreased in size.  Widespread liver metastases all decreased.  Previously visualized peritoneal nodule adjacent to the distal stomach resolved.  Left lower lobe pulmonary nodule stable.  No new or progressive metastatic disease. Cycle 12 FOLFIRI 12/09/2020 Cycle 13 FOLFIRI 12/23/2020 Cycle 14 FOLFIRI 01/06/2021 Cycle 15 FOLFIRI 01/20/2021 Cycle 16 FOLFIRI 02/03/2021 Cycle 17 FOLFIRI 02/17/2021 CTs 02/27/2021-slight decrease in size of pancreas tail mass and hypodense liver lesions, unchanged left lower lobe nodule, enlargement of a single peritoneal nodule anterior to the distal descending colon Cycle 18 FOLFIRI 03/04/2021 Cycle 19 FOLFIRI 03/17/2021 Cycle 20 FOLFIRI 03/31/2021 Cycle 21 FOLFIRI 04/14/2021 CTs 04/24/2021-unchanged nodule left lower lobe.  Numerous hypodense liver lesions, a few of which are clearly increased in size.  Unchanged mass pancreatic tail. Cycle 1 gemcitabine/Abraxane 05/05/2021 Cycle 2 gemcitabine/Abraxane 05/19/2021 Cycle 3  gemcitabine/Abraxane  06/02/2021 Cycle 4 gemcitabine/Abraxane 06/16/2021 Cycle 5 gemcitabine/Abraxane 06/30/2021 CTs 07/11/2021-enlargement of pancreas tail mass and liver metastases, stable left lower lobe nodule, stable left lower quadrant omental nodule, stable bilateral ovarian lesions-not definitely simple cysts Cycle 1 FOLFIRINOX 07/21/2021 Cycle 2 FOLFIRINOX 08/05/2021 Cycle 3 FOLFIRINOX 08/21/2021, oxaliplatin dose reduced due to neuropathy Cycle 4 FOLFIRINOX 09/01/2021 Cycle 5 FOLFIRINOX 09/15/2021 2.  Pain secondary to #1-improved 3.  Weight loss-improved 4.  Family history of pancreas cancer 5.  Neutropenia secondary chemotherapy-G-CSF added with cycle 5 FOLFIRINOX 6.  Oxaliplatin neuropathy-oxaliplatin held 09/16/2020, 10/14/2020, 10/28/2020, 11/11/2020, 11/25/2020    Disposition: Kirsten [name] appears unchanged.  She continues to tolerate the FOLFIRINOX well.  She will complete another cycle today.  She will be referred for a restaging CT if the CA 19-9 is higher today.  If the CA 19-9 is lower she will complete another cycle of chemotherapy prior to restaging CTs.  [name], MD  09/15/2021  10:55 AM

## 2021-09-15 NOTE — Patient Instructions (Signed)
[facility]  Discharge Instructions: Thank you for choosing [facility] to provide your oncology and hematology care.   If you have a lab appointment with the [facility], please go directly to the [facility] and check in at the registration area.   Wear comfortable clothing and clothing appropriate for easy access to any Portacath or PICC line.   We strive to give you quality time with your provider. You may need to reschedule your appointment if you arrive late (15 or more minutes).  Arriving late affects you and other patients whose appointments are after yours.  Also, if you miss three or more appointments without notifying the office, you may be dismissed from the clinic at the provider's discretion.      For prescription refill requests, have your pharmacy contact our office and allow 72 hours for refills to be completed.    Today you received the following chemotherapy and/or immunotherapy agents Oxaliplatin, Irinotecan, Leucovorin and Adrucil       To help prevent nausea and vomiting after your treatment, we encourage you to take your nausea medication as directed.  BELOW ARE SYMPTOMS THAT SHOULD BE REPORTED IMMEDIATELY: *FEVER GREATER THAN 100.4 F (38 C) OR HIGHER *CHILLS OR SWEATING *NAUSEA AND VOMITING THAT IS NOT CONTROLLED WITH YOUR NAUSEA MEDICATION *UNUSUAL SHORTNESS OF BREATH *UNUSUAL BRUISING OR BLEEDING *URINARY PROBLEMS (pain or burning when urinating, or frequent urination) *BOWEL PROBLEMS (unusual diarrhea, constipation, pain near the anus) TENDERNESS IN MOUTH AND THROAT WITH OR WITHOUT PRESENCE OF ULCERS (sore throat, sores in mouth, or a toothache) UNUSUAL RASH, SWELLING OR PAIN  UNUSUAL VAGINAL DISCHARGE OR ITCHING   Items with * indicate a potential emergency and should be followed up as soon as possible or go to the Emergency Department if any problems should occur.  Please show the CHEMOTHERAPY ALERT CARD or  IMMUNOTHERAPY ALERT CARD at check-in to the Emergency Department and triage nurse.  Should you have questions after your visit or need to cancel or reschedule your appointment, please contact [facility]  Dept: [phone]  and follow the prompts.  Office hours are 8:00 a.m. to 4:30 p.m. Monday - Friday. Please note that voicemails left after 4:00 p.m. may not be returned until the following business day.  We are closed weekends and major holidays. You have access to a nurse at all times for urgent questions. Please call the main number to the clinic Dept: [phone] and follow the prompts.   For any non-urgent questions, you may also contact your provider using MyChart. We now offer e-Visits for anyone 21 and older to request care online for non-urgent symptoms. For details visit [email or web address].   Also download the MyChart app! Go to the app store, search "MyChart", open the app, select [facility], and log in with your MyChart username and password.  Masks are optional in the cancer centers. If you would like for your care team to wear a mask while they are taking care of you, please let them know. For doctor visits, patients may have with them one support person who is at least 63 years old. At this time, visitors are not allowed in the infusion area.

## 2021-09-15 NOTE — Patient Instructions (Signed)

## 2021-09-16 ENCOUNTER — Inpatient Hospital Stay: Payer: Medicaid Other

## 2021-09-17 ENCOUNTER — Inpatient Hospital Stay: Payer: Medicaid Other

## 2021-09-17 VITALS — BP 151/85 | HR 88 | Temp 98.4°F | Resp 18

## 2021-09-17 DIAGNOSIS — C252 Malignant neoplasm of tail of pancreas: Secondary | ICD-10-CM

## 2021-09-17 DIAGNOSIS — Z5111 Encounter for antineoplastic chemotherapy: Secondary | ICD-10-CM | POA: Diagnosis not present

## 2021-09-17 LAB — CANCER ANTIGEN 19-9: CA 19-9: 35624 U/mL — ABNORMAL HIGH (ref 0–35)

## 2021-09-17 MED ORDER — HEPARIN SOD (PORK) LOCK FLUSH 100 UNIT/ML IV SOLN
500.0000 [IU] | Freq: Once | INTRAVENOUS | Status: AC | PRN
  Administered 2021-09-17: 500 [IU]

## 2021-09-17 MED ORDER — SODIUM CHLORIDE 0.9% FLUSH
10.0000 mL | INTRAVENOUS | Status: DC | PRN
  Administered 2021-09-17: 10 mL

## 2021-09-17 NOTE — Patient Instructions (Signed)

## 2021-09-18 ENCOUNTER — Inpatient Hospital Stay: Payer: Medicaid Other

## 2021-09-22 ENCOUNTER — Other Ambulatory Visit: Payer: Self-pay

## 2021-09-28 ENCOUNTER — Other Ambulatory Visit: Payer: Self-pay | Admitting: Oncology

## 2021-09-29 ENCOUNTER — Inpatient Hospital Stay: Payer: Medicaid Other

## 2021-09-29 ENCOUNTER — Other Ambulatory Visit: Payer: Self-pay

## 2021-09-29 ENCOUNTER — Encounter: Payer: Self-pay | Admitting: *Deleted

## 2021-09-29 ENCOUNTER — Inpatient Hospital Stay (HOSPITAL_BASED_OUTPATIENT_CLINIC_OR_DEPARTMENT_OTHER): Payer: Medicaid Other | Admitting: Oncology

## 2021-09-29 VITALS — BP 150/89 | HR 98 | Temp 98.2°F | Resp 18 | Ht 63.0 in | Wt 121.0 lb

## 2021-09-29 DIAGNOSIS — C252 Malignant neoplasm of tail of pancreas: Secondary | ICD-10-CM

## 2021-09-29 DIAGNOSIS — C787 Secondary malignant neoplasm of liver and intrahepatic bile duct: Secondary | ICD-10-CM

## 2021-09-29 DIAGNOSIS — Z5111 Encounter for antineoplastic chemotherapy: Secondary | ICD-10-CM | POA: Diagnosis not present

## 2021-09-29 LAB — CBC WITH DIFFERENTIAL (CANCER CENTER ONLY)
Abs Immature Granulocytes: 0.01 10*3/uL (ref 0.00–0.07)
Basophils Absolute: 0.1 10*3/uL (ref 0.0–0.1)
Basophils Relative: 1 %
Eosinophils Absolute: 0.2 10*3/uL (ref 0.0–0.5)
Eosinophils Relative: 4 %
HCT: 32.2 % — ABNORMAL LOW (ref 36.0–46.0)
Hemoglobin: 10.3 g/dL — ABNORMAL LOW (ref 12.0–15.0)
Immature Granulocytes: 0 %
Lymphocytes Relative: 37 %
Lymphs Abs: 1.8 10*3/uL (ref 0.7–4.0)
MCH: 28.8 pg (ref 26.0–34.0)
MCHC: 32 g/dL (ref 30.0–36.0)
MCV: 89.9 fL (ref 80.0–100.0)
Monocytes Absolute: 0.7 10*3/uL (ref 0.1–1.0)
Monocytes Relative: 15 %
Neutro Abs: 2.1 10*3/uL (ref 1.7–7.7)
Neutrophils Relative %: 43 %
Platelet Count: 256 10*3/uL (ref 150–400)
RBC: 3.58 MIL/uL — ABNORMAL LOW (ref 3.87–5.11)
RDW: 15.9 % — ABNORMAL HIGH (ref 11.5–15.5)
WBC Count: 4.9 10*3/uL (ref 4.0–10.5)
nRBC: 0 % (ref 0.0–0.2)

## 2021-09-29 LAB — CMP (CANCER CENTER ONLY)
ALT: 56 U/L — ABNORMAL HIGH (ref 0–44)
AST: 73 U/L — ABNORMAL HIGH (ref 15–41)
Albumin: 3.5 g/dL (ref 3.5–5.0)
Alkaline Phosphatase: 476 U/L — ABNORMAL HIGH (ref 38–126)
Anion gap: 10 (ref 5–15)
BUN: 6 mg/dL — ABNORMAL LOW (ref 8–23)
CO2: 26 mmol/L (ref 22–32)
Calcium: 9.8 mg/dL (ref 8.9–10.3)
Chloride: 104 mmol/L (ref 98–111)
Creatinine: 0.67 mg/dL (ref 0.44–1.00)
GFR, Estimated: >60 mL/min (ref >60)
Glucose, Bld: 113 mg/dL — ABNORMAL HIGH (ref 70–99)
Potassium: 3.3 mmol/L — ABNORMAL LOW (ref 3.5–5.1)
Sodium: 140 mmol/L (ref 135–145)
Total Bilirubin: 0.7 mg/dL (ref 0.3–1.2)
Total Protein: 6.7 g/dL (ref 6.5–8.1)

## 2021-09-29 MED ORDER — SODIUM CHLORIDE 0.9 % IV SOLN
150.0000 mg | Freq: Once | INTRAVENOUS | Status: AC
  Administered 2021-09-29: 150 mg via INTRAVENOUS
  Filled 2021-09-29: qty 150

## 2021-09-29 MED ORDER — SODIUM CHLORIDE 0.9 % IV SOLN
2400.0000 mg/m2 | INTRAVENOUS | Status: DC
  Administered 2021-09-29: 3750 mg via INTRAVENOUS
  Filled 2021-09-29: qty 75

## 2021-09-29 MED ORDER — SODIUM CHLORIDE 0.9 % IV SOLN
125.0000 mg/m2 | Freq: Once | INTRAVENOUS | Status: AC
  Administered 2021-09-29: 200 mg via INTRAVENOUS
  Filled 2021-09-29: qty 10

## 2021-09-29 MED ORDER — DEXTROSE 5 % IV SOLN: Freq: Once | INTRAVENOUS | Status: AC

## 2021-09-29 MED ORDER — SODIUM CHLORIDE 0.9 % IV SOLN
10.0000 mg | Freq: Once | INTRAVENOUS | Status: AC
  Administered 2021-09-29: 10 mg via INTRAVENOUS
  Filled 2021-09-29: qty 10

## 2021-09-29 MED ORDER — POTASSIUM CHLORIDE ER 10 MEQ PO CPCR: 10.0000 meq | ORAL_CAPSULE | Freq: Every day | ORAL | 1 refills | Status: DC

## 2021-09-29 MED ORDER — SODIUM CHLORIDE 0.9 % IV SOLN
400.0000 mg/m2 | Freq: Once | INTRAVENOUS | Status: AC
  Administered 2021-09-29: 628 mg via INTRAVENOUS
  Filled 2021-09-29: qty 31.4

## 2021-09-29 MED ORDER — OXALIPLATIN CHEMO INJECTION 100 MG/20ML
40.0000 mg/m2 | Freq: Once | INTRAVENOUS | Status: AC
  Administered 2021-09-29: 65 mg via INTRAVENOUS
  Filled 2021-09-29: qty 13

## 2021-09-29 MED ORDER — PALONOSETRON HCL INJECTION 0.25 MG/5ML
0.2500 mg | Freq: Once | INTRAVENOUS | Status: AC
  Administered 2021-09-29: 0.25 mg via INTRAVENOUS
  Filled 2021-09-29: qty 5

## 2021-09-29 MED ORDER — ATROPINE SULFATE 1 MG/ML IV SOLN
0.5000 mg | Freq: Once | INTRAVENOUS | Status: AC | PRN
  Administered 2021-09-29: 0.5 mg via INTRAVENOUS
  Filled 2021-09-29: qty 1

## 2021-09-29 NOTE — Progress Notes (Signed)
[facility] OFFICE PROGRESS NOTE   Diagnosis: Pancreas cancer  INTERVAL HISTORY:   Kirsten [name] returns as scheduled.  Completed on cycle FOLFIRINOX on 09/15/2021.  No nausea/vomiting, mouth sores, or diarrhea.  No change in neuropathy symptoms.  She has mild discomfort in the right upper abdomen.  Objective:  Vital signs in last 24 hours:  Blood pressure (!) 150/89, pulse 98, temperature 98.2 F (36.8 C), temperature source Oral, resp. rate 18, height '5\' 3"'$  (1.6 m), weight 121 lb (54.9 kg), SpO2 100 %.    HEENT: No thrush or ulcers Resp: Lungs clear bilaterally Cardio: Regular rate and rhythm GI: The liver edge is palpable a few fingers below the right costal margin.  No splenomegaly. Vascular: No leg edema Neuro: Moderate to severe loss of vibratory sense at the fingertips bilaterally   Portacath/PICC-without erythema  Lab Results:  Lab Results  Component Value Date   WBC 4.9 09/29/2021   HGB 10.3 (L) 09/29/2021   HCT 32.2 (L) 09/29/2021   MCV 89.9 09/29/2021   PLT 256 09/29/2021   NEUTROABS 2.1 09/29/2021    CMP  Lab Results  Component Value Date   NA 140 09/29/2021   K 3.3 (L) 09/29/2021   CL 104 09/29/2021   CO2 26 09/29/2021   GLUCOSE 113 (H) 09/29/2021   BUN 6 (L) 09/29/2021   CREATININE 0.67 09/29/2021   CALCIUM 9.8 09/29/2021   PROT 6.7 09/29/2021   ALBUMIN 3.5 09/29/2021   AST 73 (H) 09/29/2021   ALT 56 (H) 09/29/2021   ALKPHOS 476 (H) 09/29/2021   BILITOT 0.7 09/29/2021   GFRNONAA >60 09/29/2021    Lab Results  Component Value Date   [id number] 35,624 (H) 09/15/2021     Medications: I have reviewed the patient's current medications.   Assessment/Plan: Metastatic pancreas cancer Right upper quadrant ultrasound 06/17/2020-multiple hypoechoic liver lesions CT abdomen/pelvis 06/17/2020- pancreas tail mass, multiple liver metastases, enlarged periaortic nodes, right ovary metastasis?,  Nodularity at the left paracolic gutter and  posterior/inferior stomach suggesting peritoneal tumor spread CT chest 06/17/2020- scattered small pulmonary nodules consistent with metastatic disease Ultrasound-guided biopsy of a left liver mass 06/18/2020- adenocarcinoma, morphology compatible with metastatic pancreatic adenocarcinoma, MSS, tumor mutation burden 0 Cycle 1 FOLFOX 07/01/2020 Cycle 2 FOLFOX 07/16/2020 Cycle 3 FOLFOX 07/31/2020 Cycle 4 FOLFIRINOX 08/13/2020 Cycle 5 FOLFIRINOX 09/03/2020, Udenyca CTs 09/13/2020-unchanged nodule medial left lower lobe; stable nodal and likely peritoneal disease in the abdomen/pelvis; unchanged pancreatic tail mass; multifocal hepatic metastases, mildly progressive Cycle 6 FOLFIRINOX 09/16/2020, Oxaliplatin held Cycle 7 FOLFIRINOX 09/30/2020 Cycle 8 FOLFIRINOX 10/14/2020, oxaliplatin held Cycle 9 FOLFIRINOX 10/28/2020, oxaliplatin held Cycle 10 FOLFIRINOX 11/11/2020, oxaliplatin held Cycle 11 FOLFIRINOX 11/25/2020, oxaliplatin held CTs 12/06/2020-primary pancreatic tail tumor decreased in size.  Widespread liver metastases all decreased.  Previously visualized peritoneal nodule adjacent to the distal stomach resolved.  Left lower lobe pulmonary nodule stable.  No new or progressive metastatic disease. Cycle 12 FOLFIRI 12/09/2020 Cycle 13 FOLFIRI 12/23/2020 Cycle 14 FOLFIRI 01/06/2021 Cycle 15 FOLFIRI 01/20/2021 Cycle 16 FOLFIRI 02/03/2021 Cycle 17 FOLFIRI 02/17/2021 CTs 02/27/2021-slight decrease in size of pancreas tail mass and hypodense liver lesions, unchanged left lower lobe nodule, enlargement of a single peritoneal nodule anterior to the distal descending colon Cycle 18 FOLFIRI 03/04/2021 Cycle 19 FOLFIRI 03/17/2021 Cycle 20 FOLFIRI 03/31/2021 Cycle 21 FOLFIRI 04/14/2021 CTs 04/24/2021-unchanged nodule left lower lobe.  Numerous hypodense liver lesions, a few of which are clearly increased in size.  Unchanged mass pancreatic tail. Cycle 1 gemcitabine/Abraxane 05/05/2021  Cycle 2 gemcitabine/Abraxane  05/19/2021 Cycle 3 gemcitabine/Abraxane 06/02/2021 Cycle 4 gemcitabine/Abraxane 06/16/2021 Cycle 5 gemcitabine/Abraxane 06/30/2021 CTs 07/11/2021-enlargement of pancreas tail mass and liver metastases, stable left lower lobe nodule, stable left lower quadrant omental nodule, stable bilateral ovarian lesions-not definitely simple cysts Cycle 1 FOLFIRINOX 07/21/2021 Cycle 2 FOLFIRINOX 08/05/2021 Cycle 3 FOLFIRINOX 08/21/2021, oxaliplatin dose reduced due to neuropathy Cycle 4 FOLFIRINOX 09/01/2021 Cycle 5 FOLFIRINOX 09/15/2021 Cycle 6 FOLFIRINOX 09/29/2021 2.  Pain secondary to #1-improved 3.  Weight loss-improved 4.  Family history of pancreas cancer 5.  Neutropenia secondary chemotherapy-G-CSF added with cycle 5 FOLFIRINOX 6.  Oxaliplatin neuropathy-oxaliplatin held 09/16/2020, 10/14/2020, 10/28/2020, 11/11/2020, 11/25/2020      Disposition: Kirsten [name] appears stable.  She is tolerating the FOLFIRINOX well.  There is been no change in her neuropathy symptoms since resuming FOLFIRINOX.  She will complete cycle 6 today.  She will undergo restaging CTs after this cycle. Kirsten [name] will return for an office visit in 3 weeks.  Kirsten Coder, MD  09/29/2021  9:12 AM

## 2021-09-29 NOTE — Progress Notes (Signed)
Patient seen by Dr. Benay Spice today  Vitals are within treatment parameters.  Labs reviewed by Dr. Benay Spice and are within treatment parameters. Starting on oral K+  Per physician team, patient is ready for treatment and there are NO modifications to the treatment plan.

## 2021-09-29 NOTE — Patient Instructions (Signed)
[facility]   Discharge Instructions: Thank you for choosing [facility] to provide your oncology and hematology care.   If you have a lab appointment with the [facility], please go directly to the [facility] and check in at the registration area.   Wear comfortable clothing and clothing appropriate for easy access to any Portacath or PICC line.   We strive to give you quality time with your provider. You may need to reschedule your appointment if you arrive late (15 or more minutes).  Arriving late affects you and other patients whose appointments are after yours.  Also, if you miss three or more appointments without notifying the office, you may be dismissed from the clinic at the provider's discretion.      For prescription refill requests, have your pharmacy contact our office and allow 72 hours for refills to be completed.    Today you received the following chemotherapy and/or immunotherapy agents Oxaliplatin (ELOXATIN), Irinotecan (CAMPTOSAR), Leucovorin & Flourouracil (ADRUCIL).      To help prevent nausea and vomiting after your treatment, we encourage you to take your nausea medication as directed.  BELOW ARE SYMPTOMS THAT SHOULD BE REPORTED IMMEDIATELY: *FEVER GREATER THAN 100.4 F (38 C) OR HIGHER *CHILLS OR SWEATING *NAUSEA AND VOMITING THAT IS NOT CONTROLLED WITH YOUR NAUSEA MEDICATION *UNUSUAL SHORTNESS OF BREATH *UNUSUAL BRUISING OR BLEEDING *URINARY PROBLEMS (pain or burning when urinating, or frequent urination) *BOWEL PROBLEMS (unusual diarrhea, constipation, pain near the anus) TENDERNESS IN MOUTH AND THROAT WITH OR WITHOUT PRESENCE OF ULCERS (sore throat, sores in mouth, or a toothache) UNUSUAL RASH, SWELLING OR PAIN  UNUSUAL VAGINAL DISCHARGE OR ITCHING   Items with * indicate a potential emergency and should be followed up as soon as possible or go to the Emergency Department if any problems should occur.  Please  show the CHEMOTHERAPY ALERT CARD or IMMUNOTHERAPY ALERT CARD at check-in to the Emergency Department and triage nurse.  Should you have questions after your visit or need to cancel or reschedule your appointment, please contact [facility]  Dept: [phone]  and follow the prompts.  Office hours are 8:00 a.m. to 4:30 p.m. Monday - Friday. Please note that voicemails left after 4:00 p.m. may not be returned until the following business day.  We are closed weekends and major holidays. You have access to a nurse at all times for urgent questions. Please call the main number to the clinic Dept: [phone] and follow the prompts.   For any non-urgent questions, you may also contact your provider using MyChart. We now offer e-Visits for anyone 65 and older to request care online for non-urgent symptoms. For details visit [email or web address].   Also download the MyChart app! Go to the app store, search "MyChart", open the app, select [facility], and log in with your MyChart username and password.  Masks are optional in the cancer centers. If you would like for your care team to wear a mask while they are taking care of you, please let them know. For doctor visits, patients may have with them one support person who is at least 63 years old. At this time, visitors are not allowed in the infusion area.  Oxaliplatin Injection What is this medication? OXALIPLATIN (ox AL i PLA tin) is a chemotherapy drug. It targets fast dividing cells, like cancer cells, and causes these cells to die. This medicine is used to treat cancers of the colon and rectum,  and many other cancers. This medicine may be used for other purposes; ask your health care provider or pharmacist if you have questions. COMMON BRAND NAME(S): Eloxatin What should I tell my care team before I take this medication? They need to know if you have any of these conditions: heart disease history of irregular  heartbeat liver disease low blood counts, like white cells, platelets, or red blood cells lung or breathing disease, like asthma take medicines that treat or prevent blood clots tingling of the fingers or toes, or other nerve disorder an unusual or allergic reaction to oxaliplatin, other chemotherapy, other medicines, foods, dyes, or preservatives pregnant or trying to get pregnant breast-feeding How should I use this medication? This drug is given as an infusion into a vein. It is administered in a hospital or clinic by a specially trained health care professional. Talk to your pediatrician regarding the use of this medicine in children. Special care may be needed. Overdosage: If you think you have taken too much of this medicine contact a poison control center or emergency room at once. NOTE: This medicine is only for you. Do not share this medicine with others. What if I miss a dose? It is important not to miss a dose. Call your doctor or health care professional if you are unable to keep an appointment. What may interact with this medication? Do not take this medicine with any of the following medications: cisapride dronedarone pimozide thioridazine This medicine may also interact with the following medications: aspirin and aspirin-like medicines certain medicines that treat or prevent blood clots like warfarin, apixaban, dabigatran, and rivaroxaban cisplatin cyclosporine diuretics medicines for infection like acyclovir, adefovir, amphotericin B, bacitracin, cidofovir, foscarnet, ganciclovir, gentamicin, pentamidine, vancomycin NSAIDs, medicines for pain and inflammation, like ibuprofen or naproxen other medicines that prolong the QT interval (an abnormal heart rhythm) pamidronate zoledronic acid This list may not describe all possible interactions. Give your health care provider a list of all the medicines, herbs, non-prescription drugs, or dietary supplements you use. Also tell  them if you smoke, drink alcohol, or use illegal drugs. Some items may interact with your medicine. What should I watch for while using this medication? Your condition will be monitored carefully while you are receiving this medicine. You may need blood work done while you are taking this medicine. This medicine may make you feel generally unwell. This is not uncommon as chemotherapy can affect healthy cells as well as cancer cells. Report any side effects. Continue your course of treatment even though you feel ill unless your healthcare professional tells you to stop. This medicine can make you more sensitive to cold. Do not drink cold drinks or use ice. Cover exposed skin before coming in contact with cold temperatures or cold objects. When out in cold weather wear warm clothing and cover your mouth and nose to warm the air that goes into your lungs. Tell your doctor if you get sensitive to the cold. Do not become pregnant while taking this medicine or for 9 months after stopping it. Women should inform their health care professional if they wish to become pregnant or think they might be pregnant. Men should not father a child while taking this medicine and for 6 months after stopping it. There is potential for serious side effects to an unborn child. Talk to your health care professional for more information. Do not breast-feed a child while taking this medicine or for 3 months after stopping it. This medicine has caused ovarian failure  in some women. This medicine may make it more difficult to get pregnant. Talk to your health care professional if you are concerned about your fertility. This medicine has caused decreased sperm counts in some men. This may make it more difficult to father a child. Talk to your health care professional if you are concerned about your fertility. This medicine may increase your risk of getting an infection. Call your health care professional for advice if you get a fever,  chills, or sore throat, or other symptoms of a cold or flu. Do not treat yourself. Try to avoid being around people who are sick. Avoid taking medicines that contain aspirin, acetaminophen, ibuprofen, naproxen, or ketoprofen unless instructed by your health care professional. These medicines may hide a fever. Be careful brushing or flossing your teeth or using a toothpick because you may get an infection or bleed more easily. If you have any dental work done, tell your dentist you are receiving this medicine. What side effects may I notice from receiving this medication? Side effects that you should report to your doctor or health care professional as soon as possible: allergic reactions like skin rash, itching or hives, swelling of the face, lips, or tongue breathing problems cough low blood counts - this medicine may decrease the number of white blood cells, red blood cells, and platelets. You may be at increased risk for infections and bleeding nausea, vomiting pain, redness, or irritation at site where injected pain, tingling, numbness in the hands or feet signs and symptoms of bleeding such as bloody or black, tarry stools; red or dark brown urine; spitting up blood or brown material that looks like coffee grounds; red spots on the skin; unusual bruising or bleeding from the eyes, gums, or nose signs and symptoms of a dangerous change in heartbeat or heart rhythm like chest pain; dizziness; fast, irregular heartbeat; palpitations; feeling faint or lightheaded; falls signs and symptoms of infection like fever; chills; cough; sore throat; pain or trouble passing urine signs and symptoms of liver injury like dark yellow or brown urine; general ill feeling or flu-like symptoms; light-colored stools; loss of appetite; nausea; right upper belly pain; unusually weak or tired; yellowing of the eyes or skin signs and symptoms of low red blood cells or anemia such as unusually weak or tired; feeling faint  or lightheaded; falls signs and symptoms of muscle injury like dark urine; trouble passing urine or change in the amount of urine; unusually weak or tired; muscle pain; back pain Side effects that usually do not require medical attention (report to your doctor or health care professional if they continue or are bothersome): changes in taste diarrhea gas hair loss loss of appetite mouth sores This list may not describe all possible side effects. Call your doctor for medical advice about side effects. You may report side effects to FDA at 1-800-FDA-1088. Where should I keep my medication? This drug is given in a hospital or clinic and will not be stored at home. NOTE: This sheet is a summary. It may not cover all possible information. If you have questions about this medicine, talk to your doctor, pharmacist, or health care provider.  2023 Elsevier/Gold Standard (2021-01-17 00:00:00) Irinotecan injection What is this medication? IRINOTECAN (ir in oh TEE kan ) is a chemotherapy drug. It is used to treat colon and rectal cancer. This medicine may be used for other purposes; ask your health care provider or pharmacist if you have questions. COMMON BRAND NAME(S): Camptosar What should  I tell my care team before I take this medication? They need to know if you have any of these conditions: dehydration diarrhea infection (especially a virus infection such as chickenpox, cold sores, or herpes) liver disease low blood counts, like low white cell, platelet, or red cell counts low levels of calcium, magnesium, or potassium in the blood recent or ongoing radiation therapy an unusual or allergic reaction to irinotecan, other medicines, foods, dyes, or preservatives pregnant or trying to get pregnant breast-feeding How should I use this medication? This drug is given as an infusion into a vein. It is administered in a hospital or clinic by a specially trained health care professional. Talk to your  pediatrician regarding the use of this medicine in children. Special care may be needed. Overdosage: If you think you have taken too much of this medicine contact a poison control center or emergency room at once. NOTE: This medicine is only for you. Do not share this medicine with others. What if I miss a dose? It is important not to miss your dose. Call your doctor or health care professional if you are unable to keep an appointment. What may interact with this medication? Do not take this medicine with any of the following medications: cobicistat itraconazole This medicine may interact with the following medications: antiviral medicines for HIV or AIDS certain antibiotics like rifampin or rifabutin certain medicines for fungal infections like ketoconazole, posaconazole, and voriconazole certain medicines for seizures like carbamazepine, phenobarbital, phenotoin clarithromycin gemfibrozil nefazodone St. John's Wort This list may not describe all possible interactions. Give your health care provider a list of all the medicines, herbs, non-prescription drugs, or dietary supplements you use. Also tell them if you smoke, drink alcohol, or use illegal drugs. Some items may interact with your medicine. What should I watch for while using this medication? Your condition will be monitored carefully while you are receiving this medicine. You will need important blood work done while you are taking this medicine. This drug may make you feel generally unwell. This is not uncommon, as chemotherapy can affect healthy cells as well as cancer cells. Report any side effects. Continue your course of treatment even though you feel ill unless your doctor tells you to stop. In some cases, you may be given additional medicines to help with side effects. Follow all directions for their use. You may get drowsy or dizzy. Do not drive, use machinery, or do anything that needs mental alertness until you know how this  medicine affects you. Do not stand or sit up quickly, especially if you are an older patient. This reduces the risk of dizzy or fainting spells. Call your health care professional for advice if you get a fever, chills, or sore throat, or other symptoms of a cold or flu. Do not treat yourself. This medicine decreases your body's ability to fight infections. Try to avoid being around people who are sick. Avoid taking products that contain aspirin, acetaminophen, ibuprofen, naproxen, or ketoprofen unless instructed by your doctor. These medicines may hide a fever. This medicine may increase your risk to bruise or bleed. Call your doctor or health care professional if you notice any unusual bleeding. Be careful brushing and flossing your teeth or using a toothpick because you may get an infection or bleed more easily. If you have any dental work done, tell your dentist you are receiving this medicine. Do not become pregnant while taking this medicine or for 6 months after stopping it. Women should inform  their health care professional if they wish to become pregnant or think they might be pregnant. Men should not father a child while taking this medicine and for 3 months after stopping it. There is potential for serious side effects to an unborn child. Talk to your health care professional for more information. Do not breast-feed an infant while taking this medicine or for 7 days after stopping it. This medicine has caused ovarian failure in some women. This medicine may make it more difficult to get pregnant. Talk to your health care professional if you are concerned about your fertility. This medicine has caused decreased sperm counts in some men. This may make it more difficult to father a child. Talk to your health care professional if you are concerned about your fertility. What side effects may I notice from receiving this medication? Side effects that you should report to your doctor or health care  professional as soon as possible: allergic reactions like skin rash, itching or hives, swelling of the face, lips, or tongue chest pain diarrhea flushing, runny nose, sweating during infusion low blood counts - this medicine may decrease the number of white blood cells, red blood cells and platelets. You may be at increased risk for infections and bleeding. nausea, vomiting pain, swelling, warmth in the leg signs of decreased platelets or bleeding - bruising, pinpoint red spots on the skin, black, tarry stools, blood in the urine signs of infection - fever or chills, cough, sore throat, pain or difficulty passing urine signs of decreased red blood cells - unusually weak or tired, fainting spells, lightheadedness Side effects that usually do not require medical attention (report to your doctor or health care professional if they continue or are bothersome): constipation hair loss headache loss of appetite mouth sores stomach pain This list may not describe all possible side effects. Call your doctor for medical advice about side effects. You may report side effects to FDA at 1-800-FDA-1088. Where should I keep my medication? This drug is given in a hospital or clinic and will not be stored at home. NOTE: This sheet is a summary. It may not cover all possible information. If you have questions about this medicine, talk to your doctor, pharmacist, or health care provider.  2023 Elsevier/Gold Standard (2021-01-17 00:00:00) Leucovorin injection What is this medication? LEUCOVORIN (loo koe VOR in) is used to prevent or treat the harmful effects of some medicines. This medicine is used to treat anemia caused by a low amount of folic acid in the body. It is also used with 5-fluorouracil (5-FU) to treat colon cancer. This medicine may be used for other purposes; ask your health care provider or pharmacist if you have questions. What should I tell my care team before I take this medication? They  need to know if you have any of these conditions: anemia from low levels of vitamin B-12 in the blood an unusual or allergic reaction to leucovorin, folic acid, other medicines, foods, dyes, or preservatives pregnant or trying to get pregnant breast-feeding How should I use this medication? This medicine is for injection into a muscle or into a vein. It is given by a health care professional in a hospital or clinic setting. Talk to your pediatrician regarding the use of this medicine in children. Special care may be needed. Overdosage: If you think you have taken too much of this medicine contact a poison control center or emergency room at once. NOTE: This medicine is only for you. Do not share this  medicine with others. What if I miss a dose? This does not apply. What may interact with this medication? capecitabine fluorouracil phenobarbital phenytoin primidone trimethoprim-sulfamethoxazole This list may not describe all possible interactions. Give your health care provider a list of all the medicines, herbs, non-prescription drugs, or dietary supplements you use. Also tell them if you smoke, drink alcohol, or use illegal drugs. Some items may interact with your medicine. What should I watch for while using this medication? Your condition will be monitored carefully while you are receiving this medicine. This medicine may increase the side effects of 5-fluorouracil, 5-FU. Tell your doctor or health care professional if you have diarrhea or mouth sores that do not get better or that get worse. What side effects may I notice from receiving this medication? Side effects that you should report to your doctor or health care professional as soon as possible: allergic reactions like skin rash, itching or hives, swelling of the face, lips, or tongue breathing problems fever, infection mouth sores unusual bleeding or bruising unusually weak or tired Side effects that usually do not require  medical attention (report to your doctor or health care professional if they continue or are bothersome): constipation or diarrhea loss of appetite nausea, vomiting This list may not describe all possible side effects. Call your doctor for medical advice about side effects. You may report side effects to FDA at 1-800-FDA-1088. Where should I keep my medication? This drug is given in a hospital or clinic and will not be stored at home. NOTE: This sheet is a summary. It may not cover all possible information. If you have questions about this medicine, talk to your doctor, pharmacist, or health care provider.  2023 Elsevier/Gold Standard (2007-08-25 00:00:00) Fluorouracil, 5-FU injection What is this medication? FLUOROURACIL, 5-FU (flure oh YOOR a sil) is a chemotherapy drug. It slows the growth of cancer cells. This medicine is used to treat many types of cancer like breast cancer, colon or rectal cancer, pancreatic cancer, and stomach cancer. This medicine may be used for other purposes; ask your health care provider or pharmacist if you have questions. COMMON BRAND NAME(S): Adrucil What should I tell my care team before I take this medication? They need to know if you have any of these conditions: blood disorders dihydropyrimidine dehydrogenase (DPD) deficiency infection (especially a virus infection such as chickenpox, cold sores, or herpes) kidney disease liver disease malnourished, poor nutrition recent or ongoing radiation therapy an unusual or allergic reaction to fluorouracil, other chemotherapy, other medicines, foods, dyes, or preservatives pregnant or trying to get pregnant breast-feeding How should I use this medication? This drug is given as an infusion or injection into a vein. It is administered in a hospital or clinic by a specially trained health care professional. Talk to your pediatrician regarding the use of this medicine in children. Special care may be  needed. Overdosage: If you think you have taken too much of this medicine contact a poison control center or emergency room at once. NOTE: This medicine is only for you. Do not share this medicine with others. What if I miss a dose? It is important not to miss your dose. Call your doctor or health care professional if you are unable to keep an appointment. What may interact with this medication? Do not take this medicine with any of the following medications: live virus vaccines This medicine may also interact with the following medications: medicines that treat or prevent blood clots like warfarin, enoxaparin, and dalteparin This  list may not describe all possible interactions. Give your health care provider a list of all the medicines, herbs, non-prescription drugs, or dietary supplements you use. Also tell them if you smoke, drink alcohol, or use illegal drugs. Some items may interact with your medicine. What should I watch for while using this medication? Visit your doctor for checks on your progress. This drug may make you feel generally unwell. This is not uncommon, as chemotherapy can affect healthy cells as well as cancer cells. Report any side effects. Continue your course of treatment even though you feel ill unless your doctor tells you to stop. In some cases, you may be given additional medicines to help with side effects. Follow all directions for their use. Call your doctor or health care professional for advice if you get a fever, chills or sore throat, or other symptoms of a cold or flu. Do not treat yourself. This drug decreases your body's ability to fight infections. Try to avoid being around people who are sick. This medicine may increase your risk to bruise or bleed. Call your doctor or health care professional if you notice any unusual bleeding. Be careful brushing and flossing your teeth or using a toothpick because you may get an infection or bleed more easily. If you have any  dental work done, tell your dentist you are receiving this medicine. Avoid taking products that contain aspirin, acetaminophen, ibuprofen, naproxen, or ketoprofen unless instructed by your doctor. These medicines may hide a fever. Do not become pregnant while taking this medicine. Women should inform their doctor if they wish to become pregnant or think they might be pregnant. There is a potential for serious side effects to an unborn child. Talk to your health care professional or pharmacist for more information. Do not breast-feed an infant while taking this medicine. Men should inform their doctor if they wish to father a child. This medicine may lower sperm counts. Do not treat diarrhea with over the counter products. Contact your doctor if you have diarrhea that lasts more than 2 days or if it is severe and watery. This medicine can make you more sensitive to the sun. Keep out of the sun. If you cannot avoid being in the sun, wear protective clothing and use sunscreen. Do not use sun lamps or tanning beds/booths. What side effects may I notice from receiving this medication? Side effects that you should report to your doctor or health care professional as soon as possible: allergic reactions like skin rash, itching or hives, swelling of the face, lips, or tongue low blood counts - this medicine may decrease the number of white blood cells, red blood cells and platelets. You may be at increased risk for infections and bleeding. signs of infection - fever or chills, cough, sore throat, pain or difficulty passing urine signs of decreased platelets or bleeding - bruising, pinpoint red spots on the skin, black, tarry stools, blood in the urine signs of decreased red blood cells - unusually weak or tired, fainting spells, lightheadedness breathing problems changes in vision chest pain mouth sores nausea and vomiting pain, swelling, redness at site where injected pain, tingling, numbness in the  hands or feet redness, swelling, or sores on hands or feet stomach pain unusual bleeding Side effects that usually do not require medical attention (report to your doctor or health care professional if they continue or are bothersome): changes in finger or toe nails diarrhea dry or itchy skin hair loss headache loss of appetite sensitivity of  eyes to the light stomach upset unusually teary eyes This list may not describe all possible side effects. Call your doctor for medical advice about side effects. You may report side effects to FDA at 1-800-FDA-1088. Where should I keep my medication? This drug is given in a hospital or clinic and will not be stored at home. NOTE: This sheet is a summary. It may not cover all possible information. If you have questions about this medicine, talk to your doctor, pharmacist, or health care provider.  2023 Elsevier/Gold Standard (2021-01-17 00:00:00) The chemotherapy medication bag should finish at 46 hours, 96 hours, or 7 days. For example, if your pump is scheduled for 46 hours and it was put on at 4:00 p.m., it should finish at 2:00 p.m. the day it is scheduled to come off regardless of your appointment time.     Estimated time to finish at 12:45 p.m. on Wednesday 10/01/2021.   If the display on your pump reads "Low Volume" and it is beeping, take the batteries out of the pump and come to the cancer center for it to be taken off.   If the pump alarms go off prior to the pump reading "Low Volume" then call [phone] and someone can assist you.  If the plunger comes out and the chemotherapy medication is leaking out, please use your home chemo spill kit to clean up the spill. Do NOT use paper towels or other household products.  If you have problems or questions regarding your pump, please call either [phone] (24 hours a day) or the cancer center Monday-Friday 8:00 a.m.- 4:30 p.m. at the clinic number and we will assist you. If you are  unable to get assistance, then go to the nearest Emergency Department and ask the staff to contact the IV team for assistance.

## 2021-09-30 ENCOUNTER — Other Ambulatory Visit: Payer: Self-pay

## 2021-10-01 ENCOUNTER — Encounter: Payer: Self-pay | Admitting: Oncology

## 2021-10-01 ENCOUNTER — Inpatient Hospital Stay: Payer: Self-pay | Attending: Oncology

## 2021-10-01 VITALS — BP 144/88 | HR 92 | Temp 98.2°F | Resp 20

## 2021-10-01 DIAGNOSIS — C252 Malignant neoplasm of tail of pancreas: Secondary | ICD-10-CM | POA: Insufficient documentation

## 2021-10-01 DIAGNOSIS — C787 Secondary malignant neoplasm of liver and intrahepatic bile duct: Secondary | ICD-10-CM | POA: Diagnosis not present

## 2021-10-01 DIAGNOSIS — Z452 Encounter for adjustment and management of vascular access device: Secondary | ICD-10-CM | POA: Diagnosis present

## 2021-10-01 LAB — CANCER ANTIGEN 19-9: CA 19-9: 42233 U/mL — ABNORMAL HIGH (ref 0–35)

## 2021-10-01 MED ORDER — SODIUM CHLORIDE 0.9% FLUSH
10.0000 mL | INTRAVENOUS | Status: DC | PRN
  Administered 2021-10-01: 10 mL

## 2021-10-01 MED ORDER — HEPARIN SOD (PORK) LOCK FLUSH 100 UNIT/ML IV SOLN
500.0000 [IU] | Freq: Once | INTRAVENOUS | Status: AC | PRN
  Administered 2021-10-01: 500 [IU]

## 2021-10-01 NOTE — Patient Instructions (Signed)
Heparin injection ?What is this medication? ?HEPARIN (HEP a rin) is an anticoagulant. It is used to treat or prevent clots in the veins, arteries, lungs, or heart. It stops clots from forming or getting bigger. This medicine prevents clotting during open-heart surgery, dialysis, or in patients who are confined to bed. ?This medicine may be used for other purposes; ask your health care provider or pharmacist if you have questions. ?COMMON BRAND NAME(S): Hep-Lock, Hep-Lock U/P, Hepflush-10, Monoject Prefill Advanced Heparin Lock Flush, SASH Normal Saline and Heparin ?What should I tell my care team before I take this medication? ?They need to know if you have any of these conditions: ?bleeding disorders, such as hemophilia or low blood platelets ?bowel disease or diverticulitis ?endocarditis ?high blood pressure ?liver disease ?recent surgery or delivery of a baby ?stomach ulcers ?an unusual or allergic reaction to heparin, benzyl alcohol, sulfites, other medicines, foods, dyes, or preservatives ?pregnant or trying to get pregnant ?breast-feeding ?How should I use this medication? ?This medicine is given by injection or infusion into a vein. It can also be given by injection of small amounts under the skin. It is usually given by a health care professional in a hospital or clinic setting. ?If you get this medicine at home, you will be taught how to prepare and give this medicine. Use exactly as directed. Take your medicine at regular intervals. Do not take it more often than directed. Do not stop taking except on your doctor's advice. Stopping this medicine may increase your risk of a blot clot. Be sure to refill your prescription before you run out of medicine. ?It is important that you put your used needles and syringes in a special sharps container. Do not put them in a trash can. If you do not have a sharps container, call your pharmacist or healthcare provider to get one. ?Talk to your pediatrician regarding the  use of this medicine in children. While this medicine may be prescribed for children for selected conditions, precautions do apply. ?Overdosage: If you think you have taken too much of this medicine contact a poison control center or emergency room at once. ?NOTE: This medicine is only for you. Do not share this medicine with others. ?What if I miss a dose? ?If you miss a dose, take it as soon as you can. If it is almost time for your next dose, take only that dose. Do not take double or extra doses. ?What may interact with this medication? ?Do not take this medicine with any of the following medications: ?aspirin and aspirin-like drugs ?mifepristone ?medicines that treat or prevent blood clots like warfarin, enoxaparin, and dalteparin ?palifermin ?protamine ?This medicine may also interact with the following medications: ?dextran ?digoxin ?hydroxychloroquine ?medicines for treating colds or allergies ?nicotine ?NSAIDs, medicines for pain and inflammation, like ibuprofen or naproxen ?phenylbutazone ?tetracycline antibiotics ?This list may not describe all possible interactions. Give your health care provider a list of all the medicines, herbs, non-prescription drugs, or dietary supplements you use. Also tell them if you smoke, drink alcohol, or use illegal drugs. Some items may interact with your medicine. ?What should I watch for while using this medication? ?Visit your healthcare professional for regular checks on your progress. You may need blood work done while you are taking this medicine. Your condition will be monitored carefully while you are receiving this medicine. It is important not to miss any appointments. ?Wear a medical ID bracelet or chain, and carry a card that describes your disease and details   of your medicine and dosage times. ?Notify your doctor or healthcare professional at once if you have cold, blue hands or feet. ?If you are going to need surgery or other procedure, tell your healthcare  professional that you are using this medicine. ?Avoid sports and activities that might cause injury while you are using this medicine. Severe falls or injuries can cause unseen bleeding. Be careful when using sharp tools or knives. Consider using an electric razor. Take special care brushing or flossing your teeth. Report any injuries, bruising, or red spots on the skin to your healthcare professional. ?Using this medicine for a long time may weaken your bones and increase the risk of bone fractures. ?You should make sure that you get enough calcium and vitamin D while you are taking this medicine. Discuss the foods you eat and the vitamins you take with your healthcare professional. ?Wear a medical ID bracelet or chain. Carry a card that describes your disease and details of your medicine and dosage times. ?What side effects may I notice from receiving this medication? ?Side effects that you should report to your doctor or health care professional as soon as possible: ?allergic reactions like skin rash, itching or hives, swelling of the face, lips, or tongue ?bone pain ?fever, chills ?nausea, vomiting ?signs and symptoms of bleeding such as bloody or black, tarry stools; red or dark-brown urine; spitting up blood or brown material that looks like coffee grounds; red spots on the skin; unusual bruising or bleeding from the eye, gums, or nose ?signs and symptoms of a blood clot such as chest pain; shortness of breath; pain, swelling, or warmth in the leg ?signs and symptoms of a stroke such as changes in vision; confusion; trouble speaking or understanding; severe headaches; sudden numbness or weakness of the face, arm or leg; trouble walking; dizziness; loss of coordination ?Side effects that usually do not require medical attention (report to your doctor or health care professional if they continue or are bothersome): ?hair loss ?pain, redness, or irritation at site where injected ?This list may not describe all  possible side effects. Call your doctor for medical advice about side effects. You may report side effects to FDA at 1-800-FDA-1088. ?Where should I keep my medication? ?Keep out of the reach of children. ?Store unopened vials at room temperature between 15 and 30 degrees C (59 and 86 degrees F). Do not freeze. Do not use if solution is discolored or particulate matter is present. Throw away any unused medicine after the expiration date. ?NOTE: This sheet is a summary. It may not cover all possible information. If you have questions about this medicine, talk to your doctor, pharmacist, or health care provider. ?? 2023 Elsevier/Gold Standard (2020-03-28 00:00:00) ? ?

## 2021-10-06 ENCOUNTER — Telehealth: Payer: Self-pay

## 2021-10-06 NOTE — Telephone Encounter (Signed)
TC from Pt stating she is having pain to right abdomen. Pt stated she had pain where she was unable to sit up. Pt stated her daughter changed her fentanyl patch and she took the Norco this morning and the pain is about a 2 right now. Pt stated the pain was tolerable. Pt informed if anything should change to give a call back to the office. Pt verbalized understanding.

## 2021-10-10 ENCOUNTER — Encounter: Payer: Self-pay | Admitting: Oncology

## 2021-10-17 ENCOUNTER — Ambulatory Visit (HOSPITAL_BASED_OUTPATIENT_CLINIC_OR_DEPARTMENT_OTHER)
Admission: RE | Admit: 2021-10-17 | Discharge: 2021-10-17 | Disposition: A | Discharge status: Home / Self Care | Payer: Medicaid Other | Source: Ambulatory Visit | Attending: Oncology | Admitting: Oncology

## 2021-10-17 ENCOUNTER — Inpatient Hospital Stay: Payer: Self-pay

## 2021-10-17 ENCOUNTER — Encounter (HOSPITAL_BASED_OUTPATIENT_CLINIC_OR_DEPARTMENT_OTHER): Payer: Self-pay

## 2021-10-17 DIAGNOSIS — Z95828 Presence of other vascular implants and grafts: Secondary | ICD-10-CM

## 2021-10-17 DIAGNOSIS — C252 Malignant neoplasm of tail of pancreas: Secondary | ICD-10-CM | POA: Insufficient documentation

## 2021-10-17 LAB — CBC WITH DIFFERENTIAL (CANCER CENTER ONLY)
Abs Immature Granulocytes: 0.02 10*3/uL (ref 0.00–0.07)
Basophils Absolute: 0.1 10*3/uL (ref 0.0–0.1)
Basophils Relative: 1 %
Eosinophils Absolute: 0.3 10*3/uL (ref 0.0–0.5)
Eosinophils Relative: 5 %
HCT: 33.4 % — ABNORMAL LOW (ref 36.0–46.0)
Hemoglobin: 10.9 g/dL — ABNORMAL LOW (ref 12.0–15.0)
Immature Granulocytes: 0 %
Lymphocytes Relative: 38 %
Lymphs Abs: 2.2 10*3/uL (ref 0.7–4.0)
MCH: 29.4 pg (ref 26.0–34.0)
MCHC: 32.6 g/dL (ref 30.0–36.0)
MCV: 90 fL (ref 80.0–100.0)
Monocytes Absolute: 1.2 10*3/uL — ABNORMAL HIGH (ref 0.1–1.0)
Monocytes Relative: 21 %
Neutro Abs: 2 10*3/uL (ref 1.7–7.7)
Neutrophils Relative %: 35 %
Platelet Count: 318 10*3/uL (ref 150–400)
RBC: 3.71 MIL/uL — ABNORMAL LOW (ref 3.87–5.11)
RDW: 17 % — ABNORMAL HIGH (ref 11.5–15.5)
WBC Count: 5.7 10*3/uL (ref 4.0–10.5)
nRBC: 0 % (ref 0.0–0.2)

## 2021-10-17 LAB — CMP (CANCER CENTER ONLY)
ALT: 32 U/L (ref 0–44)
AST: 55 U/L — ABNORMAL HIGH (ref 15–41)
Albumin: 3.3 g/dL — ABNORMAL LOW (ref 3.5–5.0)
Alkaline Phosphatase: 543 U/L — ABNORMAL HIGH (ref 38–126)
Anion gap: 11 (ref 5–15)
BUN: 6 mg/dL — ABNORMAL LOW (ref 8–23)
CO2: 22 mmol/L (ref 22–32)
Calcium: 10 mg/dL (ref 8.9–10.3)
Chloride: 101 mmol/L (ref 98–111)
Creatinine: 0.63 mg/dL (ref 0.44–1.00)
GFR, Estimated: >60 mL/min (ref >60)
Glucose, Bld: 92 mg/dL (ref 70–99)
Potassium: 3.7 mmol/L (ref 3.5–5.1)
Sodium: 134 mmol/L — ABNORMAL LOW (ref 135–145)
Total Bilirubin: 1 mg/dL (ref 0.3–1.2)
Total Protein: 7.1 g/dL (ref 6.5–8.1)

## 2021-10-17 MED ORDER — SODIUM CHLORIDE 0.9% FLUSH
10.0000 mL | INTRAVENOUS | Status: AC | PRN
  Administered 2021-10-17: 10 mL via INTRAVENOUS

## 2021-10-17 MED ORDER — IOHEXOL 300 MG/ML  SOLN
100.0000 mL | Freq: Once | INTRAMUSCULAR | Status: AC | PRN
  Administered 2021-10-17: 100 mL via INTRAVENOUS

## 2021-10-17 MED ORDER — HEPARIN SOD (PORK) LOCK FLUSH 100 UNIT/ML IV SOLN
500.0000 [IU] | Freq: Once | INTRAVENOUS | Status: AC
  Administered 2021-10-17: 500 [IU] via INTRAVENOUS

## 2021-10-17 MED ORDER — HEPARIN SOD (PORK) LOCK FLUSH 100 UNIT/ML IV SOLN: 500.0000 [IU] | Freq: Once | INTRAVENOUS | Status: DC

## 2021-10-17 NOTE — Addendum Note (Signed)
Addended by: Doristine Section L on: 10/17/2021 11:31 AM   Modules accepted: Orders

## 2021-10-19 ENCOUNTER — Other Ambulatory Visit: Payer: Self-pay | Admitting: Oncology

## 2021-10-19 LAB — CANCER ANTIGEN 19-9: CA 19-9: 44866 U/mL — ABNORMAL HIGH (ref 0–35)

## 2021-10-20 ENCOUNTER — Encounter: Payer: Self-pay | Admitting: Nurse Practitioner

## 2021-10-20 ENCOUNTER — Inpatient Hospital Stay (HOSPITAL_BASED_OUTPATIENT_CLINIC_OR_DEPARTMENT_OTHER): Payer: Self-pay | Admitting: Nurse Practitioner

## 2021-10-20 ENCOUNTER — Encounter: Payer: Self-pay | Admitting: *Deleted

## 2021-10-20 ENCOUNTER — Other Ambulatory Visit: Payer: Medicaid Other

## 2021-10-20 ENCOUNTER — Inpatient Hospital Stay: Payer: Self-pay

## 2021-10-20 VITALS — BP 144/87 | HR 81 | Temp 98.2°F | Resp 20 | Ht 63.0 in | Wt 118.0 lb

## 2021-10-20 DIAGNOSIS — C252 Malignant neoplasm of tail of pancreas: Secondary | ICD-10-CM

## 2021-10-20 DIAGNOSIS — C787 Secondary malignant neoplasm of liver and intrahepatic bile duct: Secondary | ICD-10-CM

## 2021-10-20 DIAGNOSIS — C259 Malignant neoplasm of pancreas, unspecified: Secondary | ICD-10-CM

## 2021-10-20 MED ORDER — HYDROCODONE-ACETAMINOPHEN 10-325 MG PO TABS: 1.0000 | ORAL_TABLET | Freq: Four times a day (QID) | ORAL | 0 refills | Status: DC | PRN

## 2021-10-20 NOTE — Progress Notes (Signed)
Kirsten [name]   Diagnosis: Pancreas cancer  INTERVAL HISTORY:   Kirsten [name] returns as scheduled.  She completed cycle 6 FOLFIRINOX 09/29/2021.  She denies nausea/vomiting.  No mouth sores.  No diarrhea.  Stable neuropathy symptoms.  Increased pain right abdomen.  She is taking 1 Vicodin tablet a day.  Objective:  Vital signs in last 24 hours:  Blood pressure (!) 144/87, pulse 81, temperature 98.2 F (36.8 C), temperature source Oral, resp. rate 20, height '5\' 3"'$  (1.6 m), weight 118 lb (53.5 kg), SpO2 100 %.    HEENT: No thrush or ulcers. Resp: Lungs clear bilaterally. Cardio: Regular rate and rhythm. GI: Abdomen soft.  Liver is palpable in the right upper abdomen, associated tenderness. Vascular: No leg edema. Skin: Palms without erythema. Port-A-Cath without erythema.   Lab Results:  Lab Results  Component Value Date   WBC 5.7 10/17/2021   HGB 10.9 (L) 10/17/2021   HCT 33.4 (L) 10/17/2021   MCV 90.0 10/17/2021   PLT 318 10/17/2021   NEUTROABS 2.0 10/17/2021    Imaging:  No results found.  Medications: I have reviewed the patient's current medications.  Assessment/Plan: Metastatic pancreas cancer Right upper quadrant ultrasound 06/17/2020-multiple hypoechoic liver lesions CT abdomen/pelvis 06/17/2020- pancreas tail mass, multiple liver metastases, enlarged periaortic nodes, right ovary metastasis?,  Nodularity at the left paracolic gutter and posterior/inferior stomach suggesting peritoneal tumor spread CT chest 06/17/2020- scattered small pulmonary nodules consistent with metastatic disease Ultrasound-guided biopsy of a left liver mass 06/18/2020- adenocarcinoma, morphology compatible with metastatic pancreatic adenocarcinoma, MSS, tumor mutation burden 0 Cycle 1 FOLFOX 07/01/2020 Cycle 2 FOLFOX 07/16/2020 Cycle 3 FOLFOX 07/31/2020 Cycle 4 FOLFIRINOX 08/13/2020 Cycle 5 FOLFIRINOX 09/03/2020, Udenyca CTs 09/13/2020-[name] nodule medial  left lower lobe; stable nodal and likely peritoneal disease in the abdomen/pelvis; [name] pancreatic tail mass; multifocal hepatic metastases, mildly progressive Cycle 6 FOLFIRINOX 09/16/2020, Oxaliplatin held Cycle 7 FOLFIRINOX 09/30/2020 Cycle 8 FOLFIRINOX 10/14/2020, oxaliplatin held Cycle 9 FOLFIRINOX 10/28/2020, oxaliplatin held Cycle 10 FOLFIRINOX 11/11/2020, oxaliplatin held Cycle 11 FOLFIRINOX 11/25/2020, oxaliplatin held CTs 12/06/2020-primary pancreatic tail tumor decreased in size.  Widespread liver metastases all decreased.  Previously visualized peritoneal nodule adjacent to the distal stomach resolved.  Left lower lobe pulmonary nodule stable.  No new or progressive metastatic disease. Cycle 12 FOLFIRI 12/09/2020 Cycle 13 FOLFIRI 12/23/2020 Cycle 14 FOLFIRI 01/06/2021 Cycle 15 FOLFIRI 01/20/2021 Cycle 16 FOLFIRI 02/03/2021 Cycle 17 FOLFIRI 02/17/2021 CTs 02/27/2021-slight decrease in size of pancreas tail mass and hypodense liver lesions, [name] left lower lobe nodule, enlargement of a single peritoneal nodule anterior to the distal descending colon Cycle 18 FOLFIRI 03/04/2021 Cycle 19 FOLFIRI 03/17/2021 Cycle 20 FOLFIRI 03/31/2021 Cycle 21 FOLFIRI 04/14/2021 CTs 04/24/2021-[name] nodule left lower lobe.  Numerous hypodense liver lesions, a few of which are clearly increased in size.  [name] mass pancreatic tail. Cycle 1 gemcitabine/Abraxane 05/05/2021 Cycle 2 gemcitabine/Abraxane 05/19/2021 Cycle 3 gemcitabine/Abraxane 06/02/2021 Cycle 4 gemcitabine/Abraxane 06/16/2021 Cycle 5 gemcitabine/Abraxane 06/30/2021 CTs 07/11/2021-enlargement of pancreas tail mass and liver metastases, stable left lower lobe nodule, stable left lower quadrant omental nodule, stable bilateral ovarian lesions-not definitely simple cysts Cycle 1 FOLFIRINOX 07/21/2021 Cycle 2 FOLFIRINOX 08/05/2021 Cycle 3 FOLFIRINOX 08/21/2021, oxaliplatin dose reduced due to neuropathy Cycle 4 FOLFIRINOX 09/01/2021 Cycle 5 FOLFIRINOX  09/15/2021 Cycle 6 FOLFIRINOX 09/29/2021 CTs 10/17/2021-numerous new and enlarged hypoenhancing lesions throughout the liver; new associated segmental intra hepatic biliary ductal dilatation of the right lobe; gallbladder again appears to be directly involved by adjacent metastatic lesions and  is difficult to clearly distinguish; interval enlargement of gastrohepatic ligament and portacaval lymph nodes; slightly diminished size and fullness of the mass of the pancreatic tail; stable or minimally enlarged nodule anterior to the distal descending colon; [name] bilateral ovarian lesions; [name] left lower lobe pulmonary nodule. 2.  Pain secondary to #1-improved 3.  Weight loss-improved 4.  Family history of pancreas cancer 5.  Neutropenia secondary chemotherapy-G-CSF added with cycle 5 FOLFIRINOX 6.  Oxaliplatin neuropathy-oxaliplatin held 09/16/2020, 10/14/2020, 10/28/2020, 11/11/2020, 11/25/2020    Disposition: Kirsten [name].  She has completed 6 cycles of FOLFIRINOX.  Restaging CTs show evidence of progression.  Results/images reviewed with Kirsten [name].  We reviewed supportive/comfort care versus a trial of liposomal irinotecan/5-fluorouracil.  Potential side effects reviewed.  She would like to proceed with liposomal irinotecan/5-fluorouracil.  She would like to begin the first cycle on 11/04/2021 due to an insurance issue.  We will see Kirsten prior to treatment that day.  Patient seen with Dr. Benay Spice.  Ned Card ANP/GNP-BC   10/20/2021  8:50 AM  This was a shared [name] with Ned Card.  Kirsten [name] was interviewed and examined.  We reviewed the CT findings and images with Kirsten [name] and Kirsten [name].  There is evidence of disease progression, chiefly in the liver.  We discussed treatment options including supportive care/hospice versus a trial of salvage systemic therapy.  She understands a small chance of a clinical response with salvage therapy.   She would like to continue treatment with 5-FU/liposomal irinotecan.  We reviewed potential toxicities associated with this regimen.  She agrees to proceed.  The plan is to begin treatment with 5-FU/liposomal irinotecan 11/04/2021.  I was present for greater than 50% of today's [name].  I performed medical decision making.  A chemotherapy plan was entered today.  Kirsten Manson, MD

## 2021-10-20 NOTE — Progress Notes (Signed)
DISCONTINUE OFF PATHWAY REGIMEN - Pancreatic Adenocarcinoma   [id number]:mFOLFIRINOX (Leucovorin IV D1 + Fluorouracil CIV D1,2 + Irinotecan IV D1 + Oxaliplatin IV D1) q14 Days:   A cycle is every 14 days:     Oxaliplatin      Leucovorin      Irinotecan      Fluorouracil   **Always confirm dose/schedule in your pharmacy ordering system**  REASON: Disease Progression PRIOR TREATMENT: Off Pathway: mFOLFIRINOX (Leucovorin IV D1 + Fluorouracil CIV D1,2 + Irinotecan IV D1 + Oxaliplatin IV D1) q14 Days TREATMENT RESPONSE: Progressive Disease (PD)  START OFF PATHWAY REGIMEN - Pancreatic Adenocarcinoma   [id number]:Fluorouracil 1,200 mg/m2/day CIV D1-2 + Leucovorin 400 mg/m2 IV D1 + Liposomal Irinotecan 70 mg/m2 IV D1 q14 Days:   A cycle is every 14 days:     Liposomal irinotecan      Leucovorin      Fluorouracil   **Always confirm dose/schedule in your pharmacy ordering system**  Patient Characteristics: Metastatic Disease, Third Line and Beyond, MSS/pMMR or MSI Unknown Therapeutic Status: Metastatic Disease Line of Therapy: Third Engineer, civil (consulting) Status: MSS/pMMR Intent of Therapy: Non-Curative / Palliative Intent, Discussed with Patient

## 2021-10-20 NOTE — Progress Notes (Signed)
Patient seen by Ned Card NP today  Vitals are within treatment parameters. Provider aware of SBP 144--no intervention required at this time.  Labs reviewed by Ned Card NP and are within treatment parameters.  OK to treat w/labs collected on 10/17/21  Per physician team, patient will not be receiving treatment today.  Progression on CT.

## 2021-10-21 ENCOUNTER — Other Ambulatory Visit: Payer: Self-pay | Admitting: *Deleted

## 2021-10-21 DIAGNOSIS — C259 Malignant neoplasm of pancreas, unspecified: Secondary | ICD-10-CM

## 2021-10-22 ENCOUNTER — Other Ambulatory Visit: Payer: Self-pay

## 2021-10-22 ENCOUNTER — Inpatient Hospital Stay: Payer: Self-pay

## 2021-11-03 ENCOUNTER — Other Ambulatory Visit: Payer: Self-pay

## 2021-11-03 ENCOUNTER — Other Ambulatory Visit: Payer: Self-pay | Admitting: Oncology

## 2021-11-04 ENCOUNTER — Other Ambulatory Visit: Payer: Self-pay | Admitting: Oncology

## 2021-11-05 ENCOUNTER — Encounter: Payer: Self-pay | Admitting: Nurse Practitioner

## 2021-11-05 ENCOUNTER — Inpatient Hospital Stay: Payer: Commercial Managed Care - HMO | Attending: Oncology

## 2021-11-05 ENCOUNTER — Encounter: Payer: Self-pay | Admitting: Oncology

## 2021-11-05 ENCOUNTER — Ambulatory Visit (HOSPITAL_BASED_OUTPATIENT_CLINIC_OR_DEPARTMENT_OTHER)
Admission: RE | Admit: 2021-11-05 | Discharge: 2021-11-05 | Disposition: A | Discharge status: Home / Self Care | Payer: Commercial Managed Care - HMO | Source: Ambulatory Visit | Attending: Nurse Practitioner | Admitting: Nurse Practitioner

## 2021-11-05 ENCOUNTER — Encounter (HOSPITAL_BASED_OUTPATIENT_CLINIC_OR_DEPARTMENT_OTHER): Payer: Self-pay

## 2021-11-05 ENCOUNTER — Inpatient Hospital Stay (HOSPITAL_BASED_OUTPATIENT_CLINIC_OR_DEPARTMENT_OTHER): Payer: Commercial Managed Care - HMO | Admitting: Nurse Practitioner

## 2021-11-05 ENCOUNTER — Inpatient Hospital Stay: Payer: Commercial Managed Care - HMO

## 2021-11-05 VITALS — BP 138/84 | HR 88 | Temp 98.2°F | Resp 18 | Wt 119.6 lb

## 2021-11-05 DIAGNOSIS — C252 Malignant neoplasm of tail of pancreas: Secondary | ICD-10-CM | POA: Insufficient documentation

## 2021-11-05 DIAGNOSIS — R109 Unspecified abdominal pain: Secondary | ICD-10-CM | POA: Diagnosis not present

## 2021-11-05 DIAGNOSIS — C787 Secondary malignant neoplasm of liver and intrahepatic bile duct: Secondary | ICD-10-CM | POA: Insufficient documentation

## 2021-11-05 DIAGNOSIS — C259 Malignant neoplasm of pancreas, unspecified: Secondary | ICD-10-CM

## 2021-11-05 LAB — CMP (CANCER CENTER ONLY)
ALT: 27 U/L (ref 0–44)
AST: 58 U/L — ABNORMAL HIGH (ref 15–41)
Albumin: 3.1 g/dL — ABNORMAL LOW (ref 3.5–5.0)
Alkaline Phosphatase: 438 U/L — ABNORMAL HIGH (ref 38–126)
Anion gap: 9 (ref 5–15)
BUN: 7 mg/dL — ABNORMAL LOW (ref 8–23)
CO2: 24 mmol/L (ref 22–32)
Calcium: 10.2 mg/dL (ref 8.9–10.3)
Chloride: 101 mmol/L (ref 98–111)
Creatinine: 0.59 mg/dL (ref 0.44–1.00)
GFR, Estimated: >60 mL/min (ref >60)
Glucose, Bld: 114 mg/dL — ABNORMAL HIGH (ref 70–99)
Potassium: 3.6 mmol/L (ref 3.5–5.1)
Sodium: 134 mmol/L — ABNORMAL LOW (ref 135–145)
Total Bilirubin: 3.6 mg/dL (ref 0.3–1.2)
Total Protein: 7.1 g/dL (ref 6.5–8.1)

## 2021-11-05 LAB — CBC WITH DIFFERENTIAL (CANCER CENTER ONLY)
Abs Immature Granulocytes: 0.06 10*3/uL (ref 0.00–0.07)
Basophils Absolute: 0.1 10*3/uL (ref 0.0–0.1)
Basophils Relative: 1 %
Eosinophils Absolute: 0.2 10*3/uL (ref 0.0–0.5)
Eosinophils Relative: 2 %
HCT: 32.1 % — ABNORMAL LOW (ref 36.0–46.0)
Hemoglobin: 10.7 g/dL — ABNORMAL LOW (ref 12.0–15.0)
Immature Granulocytes: 1 %
Lymphocytes Relative: 16 %
Lymphs Abs: 2 10*3/uL (ref 0.7–4.0)
MCH: 29.5 pg (ref 26.0–34.0)
MCHC: 33.3 g/dL (ref 30.0–36.0)
MCV: 88.4 fL (ref 80.0–100.0)
Monocytes Absolute: 1.2 10*3/uL — ABNORMAL HIGH (ref 0.1–1.0)
Monocytes Relative: 10 %
Neutro Abs: 9 10*3/uL — ABNORMAL HIGH (ref 1.7–7.7)
Neutrophils Relative %: 70 %
Platelet Count: 298 10*3/uL (ref 150–400)
RBC: 3.63 MIL/uL — ABNORMAL LOW (ref 3.87–5.11)
RDW: 17.5 % — ABNORMAL HIGH (ref 11.5–15.5)
WBC Count: 12.6 10*3/uL — ABNORMAL HIGH (ref 4.0–10.5)
nRBC: 0 % (ref 0.0–0.2)

## 2021-11-05 MED ORDER — LIDOCAINE-PRILOCAINE 2.5-2.5 % EX CREA: TOPICAL_CREAM | CUTANEOUS | 2 refills | Status: DC

## 2021-11-05 MED ORDER — HEPARIN SOD (PORK) LOCK FLUSH 100 UNIT/ML IV SOLN
500.0000 [IU] | Freq: Once | INTRAVENOUS | Status: AC
  Administered 2021-11-05: 500 [IU] via INTRAVENOUS

## 2021-11-05 MED ORDER — IOHEXOL 300 MG/ML  SOLN
100.0000 mL | Freq: Once | INTRAMUSCULAR | Status: AC | PRN
Start: 2021-11-05 — End: 2021-11-05
  Administered 2021-11-05: 75 mL via INTRAVENOUS

## 2021-11-05 MED ORDER — HYDROCODONE-ACETAMINOPHEN 10-325 MG PO TABS: 1.0000 | ORAL_TABLET | Freq: Four times a day (QID) | ORAL | 0 refills | Status: DC | PRN

## 2021-11-05 NOTE — Progress Notes (Signed)
Patient seen by Ned Card NP today  Vitals are within treatment parameters.  Labs reviewed by Ned Card NP and are not all within treatment parameters. CT ordered  Per physician team, patient will not be receiving treatment today.    Stat CT abdomen and pelvis now

## 2021-11-05 NOTE — Progress Notes (Signed)
CRITICAL VALUE STICKER  CRITICAL VALUE: Total bili-3.6  RECEIVER (on-site recipient of call):Emmelina Mcloughlin,RN  DATE & TIME NOTIFIED: 11/05/21 @ 0943  MESSENGER (representative from lab):Otila Kluver  MD NOTIFIED: Ned Card, NP  TIME OF NOTIFICATION:0945  RESPONSE:  Tx held today

## 2021-11-05 NOTE — Progress Notes (Addendum)
[facility] OFFICE PROGRESS NOTE   Diagnosis: Pancreas cancer  INTERVAL HISTORY:   Kirsten [name] returns as scheduled.  She is scheduled to begin treatment today with 5-FU/liposomal irinotecan.  She denies nausea/vomiting.  Bowels are moving.  She continues to have upper abdominal pain, especially with a deep breath.  Objective:  Vital signs in last 24 hours:  Blood pressure 138/84, pulse 88, temperature 98.2 F (36.8 C), temperature source Tympanic, resp. rate 18, weight 119 lb 9.6 oz (54.3 kg), SpO2 100 %.    HEENT: No thrush or ulcers.  Mild scleral icterus. Resp: Lungs clear bilaterally. Cardio: Regular rate and rhythm. GI: Firm fullness across the upper abdomen, associated tenderness. Vascular: No leg edema. Port-A-Cath without erythema.  Lab Results:  Lab Results  Component Value Date   WBC 12.6 (H) 11/05/2021   HGB 10.7 (L) 11/05/2021   HCT 32.1 (L) 11/05/2021   MCV 88.4 11/05/2021   PLT 298 11/05/2021   NEUTROABS 9.0 (H) 11/05/2021    Imaging:  No results found.  Medications: I have reviewed the patient's current medications.  Assessment/Plan: Metastatic pancreas cancer Right upper quadrant ultrasound 06/17/2020-multiple hypoechoic liver lesions CT abdomen/pelvis 06/17/2020- pancreas tail mass, multiple liver metastases, enlarged periaortic nodes, right ovary metastasis?,  Nodularity at the left paracolic gutter and posterior/inferior stomach suggesting peritoneal tumor spread CT chest 06/17/2020- scattered small pulmonary nodules consistent with metastatic disease Ultrasound-guided biopsy of a left liver mass 06/18/2020- adenocarcinoma, morphology compatible with metastatic pancreatic adenocarcinoma, MSS, tumor mutation burden 0 Cycle 1 FOLFOX 07/01/2020 Cycle 2 FOLFOX 07/16/2020 Cycle 3 FOLFOX 07/31/2020 Cycle 4 FOLFIRINOX 08/13/2020 Cycle 5 FOLFIRINOX 09/03/2020, Udenyca CTs 09/13/2020-unchanged nodule medial left lower lobe; stable nodal and likely  peritoneal disease in the abdomen/pelvis; unchanged pancreatic tail mass; multifocal hepatic metastases, mildly progressive Cycle 6 FOLFIRINOX 09/16/2020, Oxaliplatin held Cycle 7 FOLFIRINOX 09/30/2020 Cycle 8 FOLFIRINOX 10/14/2020, oxaliplatin held Cycle 9 FOLFIRINOX 10/28/2020, oxaliplatin held Cycle 10 FOLFIRINOX 11/11/2020, oxaliplatin held Cycle 11 FOLFIRINOX 11/25/2020, oxaliplatin held CTs 12/06/2020-primary pancreatic tail tumor decreased in size.  Widespread liver metastases all decreased.  Previously visualized peritoneal nodule adjacent to the distal stomach resolved.  Left lower lobe pulmonary nodule stable.  No new or progressive metastatic disease. Cycle 12 FOLFIRI 12/09/2020 Cycle 13 FOLFIRI 12/23/2020 Cycle 14 FOLFIRI 01/06/2021 Cycle 15 FOLFIRI 01/20/2021 Cycle 16 FOLFIRI 02/03/2021 Cycle 17 FOLFIRI 02/17/2021 CTs 02/27/2021-slight decrease in size of pancreas tail mass and hypodense liver lesions, unchanged left lower lobe nodule, enlargement of a single peritoneal nodule anterior to the distal descending colon Cycle 18 FOLFIRI 03/04/2021 Cycle 19 FOLFIRI 03/17/2021 Cycle 20 FOLFIRI 03/31/2021 Cycle 21 FOLFIRI 04/14/2021 CTs 04/24/2021-unchanged nodule left lower lobe.  Numerous hypodense liver lesions, a few of which are clearly increased in size.  Unchanged mass pancreatic tail. Cycle 1 gemcitabine/Abraxane 05/05/2021 Cycle 2 gemcitabine/Abraxane 05/19/2021 Cycle 3 gemcitabine/Abraxane 06/02/2021 Cycle 4 gemcitabine/Abraxane 06/16/2021 Cycle 5 gemcitabine/Abraxane 06/30/2021 CTs 07/11/2021-enlargement of pancreas tail mass and liver metastases, stable left lower lobe nodule, stable left lower quadrant omental nodule, stable bilateral ovarian lesions-not definitely simple cysts Cycle 1 FOLFIRINOX 07/21/2021 Cycle 2 FOLFIRINOX 08/05/2021 Cycle 3 FOLFIRINOX 08/21/2021, oxaliplatin dose reduced due to neuropathy Cycle 4 FOLFIRINOX 09/01/2021 Cycle 5 FOLFIRINOX 09/15/2021 Cycle 6 FOLFIRINOX  09/29/2021 CTs 10/17/2021-numerous new and enlarged hypoenhancing lesions throughout the liver; new associated segmental intra hepatic biliary ductal dilatation of the right lobe; gallbladder again appears to be directly involved by adjacent metastatic lesions and is difficult to clearly distinguish; interval enlargement of gastrohepatic ligament and portacaval  lymph nodes; slightly diminished size and fullness of the mass of the pancreatic tail; stable or minimally enlarged nodule anterior to the distal descending colon; unchanged bilateral ovarian lesions; unchanged left lower lobe pulmonary nodule.  2.  Pain secondary to #1-improved 3.  Weight loss-improved 4.  Family history of pancreas cancer 5.  Neutropenia secondary chemotherapy-G-CSF added with cycle 5 FOLFIRINOX 6.  Oxaliplatin neuropathy-oxaliplatin held 09/16/2020, 10/14/2020, 10/28/2020, 11/11/2020, 11/25/2020    Disposition: Kirsten [name] appears unchanged.  She is scheduled to begin treatment today with 5-FU/liposomal irinotecan.  Unexpectedly the bilirubin returned elevated.  We are holding chemotherapy and referring her for stat CT scans to evaluate for biliary obstruction.  She is scheduled for follow-up in 2 weeks.  We will adjust accordingly pending CT results.  Patient seen with Dr. Benay Spice.  Ned Card ANP/GNP-BC   11/05/2021  9:46 AM This was a shared visit with Ned Card.  Kirsten [name] is here to begin 5-FU/liposomal irinotecan.  The bilirubin returned markedly elevated.  A CT abdomen/pelvis reveals progression of liver metastases with dilation of intrahepatic bile ducts.  No common duct dilation.  I reviewed the case with interventional radiology.  There is no obstructed bile duct amenable to placement of a drain or stent.  We will discuss the CT findings with Kirsten [name] and her daughter.  I recommend placing 5-FU/liposomal irinotecan on hold.  We will recommend hospice care if the bilirubin has not improved when she returns in  2 weeks.  I was present for greater than 50% of today's visit.  I performed medical decision making.  [name], MD

## 2021-11-06 ENCOUNTER — Telehealth: Payer: Self-pay | Admitting: Nurse Practitioner

## 2021-11-06 ENCOUNTER — Other Ambulatory Visit: Payer: Self-pay

## 2021-11-06 NOTE — Telephone Encounter (Signed)
I called Kirsten [name] and her daughter to discuss the recent CT results.  They understand there is evidence of biliary dilatation.  Dr. Benay Spice spoke with an interventional radiologist.  Stent placement is not an option.  There has been progression of disease in the liver.  They understand the concern with giving chemotherapy in this situation (elevated bilirubin).  She will return for follow-up as scheduled on 11/17/2021.  She understands if she cannot receive chemotherapy the likely recommendation will be for supportive/comfort care.

## 2021-11-07 ENCOUNTER — Inpatient Hospital Stay: Payer: Commercial Managed Care - HMO

## 2021-11-10 ENCOUNTER — Encounter: Payer: Self-pay | Admitting: Oncology

## 2021-11-10 ENCOUNTER — Other Ambulatory Visit: Payer: Self-pay | Admitting: *Deleted

## 2021-11-10 DIAGNOSIS — C252 Malignant neoplasm of tail of pancreas: Secondary | ICD-10-CM

## 2021-11-10 NOTE — Progress Notes (Signed)
Lab orders for 9/18

## 2021-11-13 ENCOUNTER — Other Ambulatory Visit: Payer: Self-pay | Admitting: Nurse Practitioner

## 2021-11-13 ENCOUNTER — Telehealth: Payer: Self-pay

## 2021-11-13 DIAGNOSIS — C252 Malignant neoplasm of tail of pancreas: Secondary | ICD-10-CM

## 2021-11-13 MED ORDER — OXYCODONE HCL 5 MG PO TABS: 5.0000 mg | ORAL_TABLET | Freq: Four times a day (QID) | ORAL | 0 refills | Status: DC | PRN

## 2021-11-13 NOTE — Telephone Encounter (Signed)
Mrs. Kirsten [name] called in and stated she is in pain abdominal. Pain is a 10. Last bowel movement 11/03/21. Denied any fever, vomiting, or nausea. I encourage the patient to take a laxative or a stool softener. Lisa send in a Oxy IR 5 mg to [facility].

## 2021-11-15 ENCOUNTER — Other Ambulatory Visit: Payer: Self-pay | Admitting: Nurse Practitioner

## 2021-11-15 DIAGNOSIS — C252 Malignant neoplasm of tail of pancreas: Secondary | ICD-10-CM

## 2021-11-17 ENCOUNTER — Encounter (HOSPITAL_COMMUNITY): Payer: Self-pay | Admitting: Emergency Medicine

## 2021-11-17 ENCOUNTER — Inpatient Hospital Stay: Payer: Commercial Managed Care - HMO | Admitting: Nurse Practitioner

## 2021-11-17 ENCOUNTER — Telehealth: Payer: Self-pay

## 2021-11-17 ENCOUNTER — Other Ambulatory Visit: Payer: Self-pay

## 2021-11-17 ENCOUNTER — Inpatient Hospital Stay: Payer: Commercial Managed Care - HMO

## 2021-11-17 ENCOUNTER — Emergency Department (HOSPITAL_COMMUNITY): Payer: Commercial Managed Care - HMO

## 2021-11-17 ENCOUNTER — Inpatient Hospital Stay (HOSPITAL_COMMUNITY)
Admission: EM | Admit: 2021-11-17 | Discharge: 2021-11-20 | DRG: 948 | Disposition: A | Discharge status: Hospice | Payer: Commercial Managed Care - HMO | Attending: Family Medicine | Admitting: Family Medicine

## 2021-11-17 DIAGNOSIS — C787 Secondary malignant neoplasm of liver and intrahepatic bile duct: Secondary | ICD-10-CM | POA: Diagnosis present

## 2021-11-17 DIAGNOSIS — R627 Adult failure to thrive: Secondary | ICD-10-CM | POA: Diagnosis present

## 2021-11-17 DIAGNOSIS — R52 Pain, unspecified: Secondary | ICD-10-CM | POA: Diagnosis not present

## 2021-11-17 DIAGNOSIS — R262 Difficulty in walking, not elsewhere classified: Secondary | ICD-10-CM | POA: Diagnosis present

## 2021-11-17 DIAGNOSIS — E871 Hypo-osmolality and hyponatremia: Secondary | ICD-10-CM | POA: Diagnosis present

## 2021-11-17 DIAGNOSIS — T451X5A Adverse effect of antineoplastic and immunosuppressive drugs, initial encounter: Secondary | ICD-10-CM | POA: Diagnosis present

## 2021-11-17 DIAGNOSIS — C252 Malignant neoplasm of tail of pancreas: Secondary | ICD-10-CM | POA: Diagnosis present

## 2021-11-17 DIAGNOSIS — C7802 Secondary malignant neoplasm of left lung: Secondary | ICD-10-CM | POA: Diagnosis present

## 2021-11-17 DIAGNOSIS — Z20822 Contact with and (suspected) exposure to covid-19: Secondary | ICD-10-CM | POA: Diagnosis present

## 2021-11-17 DIAGNOSIS — Z800 Family history of malignant neoplasm of digestive organs: Secondary | ICD-10-CM | POA: Diagnosis not present

## 2021-11-17 DIAGNOSIS — D709 Neutropenia, unspecified: Secondary | ICD-10-CM | POA: Diagnosis present

## 2021-11-17 DIAGNOSIS — R6510 Systemic inflammatory response syndrome (SIRS) of non-infectious origin without acute organ dysfunction: Secondary | ICD-10-CM | POA: Diagnosis present

## 2021-11-17 DIAGNOSIS — R188 Other ascites: Secondary | ICD-10-CM

## 2021-11-17 DIAGNOSIS — R7989 Other specified abnormal findings of blood chemistry: Secondary | ICD-10-CM

## 2021-11-17 DIAGNOSIS — R17 Unspecified jaundice: Secondary | ICD-10-CM | POA: Insufficient documentation

## 2021-11-17 DIAGNOSIS — G893 Neoplasm related pain (acute) (chronic): Principal | ICD-10-CM | POA: Diagnosis present

## 2021-11-17 DIAGNOSIS — R109 Unspecified abdominal pain: Secondary | ICD-10-CM | POA: Diagnosis present

## 2021-11-17 DIAGNOSIS — Z66 Do not resuscitate: Secondary | ICD-10-CM | POA: Diagnosis present

## 2021-11-17 DIAGNOSIS — D649 Anemia, unspecified: Secondary | ICD-10-CM | POA: Diagnosis present

## 2021-11-17 DIAGNOSIS — C7801 Secondary malignant neoplasm of right lung: Secondary | ICD-10-CM | POA: Diagnosis present

## 2021-11-17 DIAGNOSIS — R1310 Dysphagia, unspecified: Secondary | ICD-10-CM | POA: Diagnosis present

## 2021-11-17 DIAGNOSIS — Z515 Encounter for palliative care: Secondary | ICD-10-CM

## 2021-11-17 DIAGNOSIS — G620 Drug-induced polyneuropathy: Secondary | ICD-10-CM | POA: Diagnosis present

## 2021-11-17 DIAGNOSIS — Z79899 Other long term (current) drug therapy: Secondary | ICD-10-CM

## 2021-11-17 DIAGNOSIS — C799 Secondary malignant neoplasm of unspecified site: Principal | ICD-10-CM

## 2021-11-17 HISTORY — DX: Malignant neoplasm of pancreas, unspecified: C25.9

## 2021-11-17 LAB — COMPREHENSIVE METABOLIC PANEL
ALT: 55 U/L — ABNORMAL HIGH (ref 0–44)
AST: 135 U/L — ABNORMAL HIGH (ref 15–41)
Albumin: 2.4 g/dL — ABNORMAL LOW (ref 3.5–5.0)
Alkaline Phosphatase: 437 U/L — ABNORMAL HIGH (ref 38–126)
Anion gap: 9 (ref 5–15)
BUN: 14 mg/dL (ref 8–23)
CO2: 25 mmol/L (ref 22–32)
Calcium: 11.6 mg/dL — ABNORMAL HIGH (ref 8.9–10.3)
Chloride: 97 mmol/L — ABNORMAL LOW (ref 98–111)
Creatinine, Ser: 0.57 mg/dL (ref 0.44–1.00)
GFR, Estimated: >60 mL/min (ref >60)
Glucose, Bld: 84 mg/dL (ref 70–99)
Potassium: 3.7 mmol/L (ref 3.5–5.1)
Sodium: 131 mmol/L — ABNORMAL LOW (ref 135–145)
Total Bilirubin: 15 mg/dL — ABNORMAL HIGH (ref 0.3–1.2)
Total Protein: 7.4 g/dL (ref 6.5–8.1)

## 2021-11-17 LAB — CBC WITH DIFFERENTIAL/PLATELET
Abs Immature Granulocytes: 0.36 10*3/uL — ABNORMAL HIGH (ref 0.00–0.07)
Basophils Absolute: 0.1 10*3/uL (ref 0.0–0.1)
Basophils Relative: 0 %
Eosinophils Absolute: 0.1 10*3/uL (ref 0.0–0.5)
Eosinophils Relative: 1 %
HCT: 32.8 % — ABNORMAL LOW (ref 36.0–46.0)
Hemoglobin: 11.2 g/dL — ABNORMAL LOW (ref 12.0–15.0)
Immature Granulocytes: 2 %
Lymphocytes Relative: 8 %
Lymphs Abs: 1.7 10*3/uL (ref 0.7–4.0)
MCH: 30.1 pg (ref 26.0–34.0)
MCHC: 34.1 g/dL (ref 30.0–36.0)
MCV: 88.2 fL (ref 80.0–100.0)
Monocytes Absolute: 1.5 10*3/uL — ABNORMAL HIGH (ref 0.1–1.0)
Monocytes Relative: 7 %
Neutro Abs: 17.5 10*3/uL — ABNORMAL HIGH (ref 1.7–7.7)
Neutrophils Relative %: 82 %
Platelets: 251 10*3/uL (ref 150–400)
RBC: 3.72 MIL/uL — ABNORMAL LOW (ref 3.87–5.11)
RDW: 20.5 % — ABNORMAL HIGH (ref 11.5–15.5)
WBC: 21.1 10*3/uL — ABNORMAL HIGH (ref 4.0–10.5)
nRBC: 0 % (ref 0.0–0.2)

## 2021-11-17 LAB — LIPASE, BLOOD: Lipase: 18 U/L (ref 11–51)

## 2021-11-17 LAB — RESP PANEL BY RT-PCR (FLU A&B, COVID) ARPGX2
Influenza A by PCR: NEGATIVE
Influenza B by PCR: NEGATIVE
SARS Coronavirus 2 by RT PCR: NEGATIVE

## 2021-11-17 MED ORDER — SODIUM CHLORIDE 0.9 % IV SOLN: INTRAVENOUS | Status: DC

## 2021-11-17 MED ORDER — SODIUM CHLORIDE 0.9 % IV BOLUS
1000.0000 mL | Freq: Once | INTRAVENOUS | Status: AC
  Administered 2021-11-17: 1000 mL via INTRAVENOUS

## 2021-11-17 MED ORDER — PROCHLORPERAZINE EDISYLATE 10 MG/2ML IJ SOLN: 10.0000 mg | Freq: Four times a day (QID) | INTRAMUSCULAR | Status: DC | PRN

## 2021-11-17 MED ORDER — HYDROMORPHONE HCL 1 MG/ML IJ SOLN
1.0000 mg | INTRAMUSCULAR | Status: DC | PRN
  Administered 2021-11-17 – 2021-11-18 (×2): 1 mg via INTRAVENOUS
  Filled 2021-11-17 (×2): qty 1

## 2021-11-17 MED ORDER — ENOXAPARIN SODIUM 40 MG/0.4ML IJ SOSY
40.0000 mg | PREFILLED_SYRINGE | INTRAMUSCULAR | Status: DC
  Administered 2021-11-17: 40 mg via SUBCUTANEOUS
  Filled 2021-11-17: qty 0.4

## 2021-11-17 MED ORDER — OXYCODONE HCL 5 MG PO TABS
5.0000 mg | ORAL_TABLET | ORAL | Status: DC | PRN
  Administered 2021-11-17: 5 mg via ORAL
  Filled 2021-11-17: qty 1

## 2021-11-17 MED ORDER — SODIUM CHLORIDE 0.9 % IV SOLN
1.0000 g | Freq: Once | INTRAVENOUS | Status: DC
  Filled 2021-11-17: qty 10

## 2021-11-17 MED ORDER — IOHEXOL 300 MG/ML  SOLN
100.0000 mL | Freq: Once | INTRAMUSCULAR | Status: AC | PRN
  Administered 2021-11-17: 100 mL via INTRAVENOUS

## 2021-11-17 MED ORDER — SODIUM CHLORIDE 0.9 % IV SOLN: 2.0000 g | INTRAVENOUS | Status: DC

## 2021-11-17 MED ORDER — BISACODYL 5 MG PO TBEC: 5.0000 mg | DELAYED_RELEASE_TABLET | Freq: Every day | ORAL | Status: DC | PRN

## 2021-11-17 MED ORDER — HYDROMORPHONE HCL 1 MG/ML IJ SOLN
1.0000 mg | Freq: Once | INTRAMUSCULAR | Status: AC
  Administered 2021-11-17: 1 mg via INTRAVENOUS
  Filled 2021-11-17: qty 1

## 2021-11-17 MED ORDER — SODIUM CHLORIDE 0.9 % IV SOLN
2.0000 g | Freq: Once | INTRAVENOUS | Status: AC
  Administered 2021-11-17: 2 g via INTRAVENOUS
  Filled 2021-11-17: qty 20

## 2021-11-17 MED ORDER — ONDANSETRON HCL 4 MG/2ML IJ SOLN
4.0000 mg | Freq: Once | INTRAMUSCULAR | Status: AC
  Administered 2021-11-17: 4 mg via INTRAVENOUS
  Filled 2021-11-17: qty 2

## 2021-11-17 NOTE — H&P (Signed)
History and Physical    Patient: Kirsten Ostrom [id number]:[id number] DOB: May 12, 1960 DOA: 11/17/2021 DOS: the patient was seen and examined on 11/17/2021 PCP: Pcp, No  Patient coming from: Home  Chief Complaint:  Chief Complaint  Patient presents with   Abdominal Pain   HPI: Kirsten [name] is a 61 y.o. female with medical history significant of metastatic pancreatic CA. Presenting with abdominal pain. History from daughter at bedside. She reports that the patient has been having worsening of her cancer pain over the last weak. It's worse in her right flank. She was given an increase dose of oxycodone by her oncologist, but this has not helped. She has been unable to tolerate much PO intake and she has been finding it difficult to get around her house. She was supposed to go for a chemo treatment this morning, but she decided to come to the ED instead. She denies any other aggravating or alleviating factors.    Review of Systems: As mentioned in the history of present illness. All other systems reviewed and are negative. Past Medical History:  Diagnosis Date   Family history of pancreatic cancer    Family history of stomach cancer    Family history of uterine cancer    Past Surgical History:  Procedure Laterality Date   IR IMAGING GUIDED PORT INSERTION  06/20/2020   Social History:  Denies tobacco use, alcohol use, and drug use.  No Known Allergies  Family History  Problem Relation Age of Onset   Uterine cancer Mother        dx <50   Stomach cancer Father        dx <50   Dementia Father    Pancreatic cancer Sister 54   Cancer Cousin 63       unknown type, maternal first cousin    Prior to Admission medications   Medication Sig Start Date End Date Taking? Authorizing Provider  Aspirin-Salicylamide-Caffeine (BC HEADACHE POWDER PO) Take 1 packet by mouth daily as needed (PAIN).   Yes [provider]  bisacodyl (DULCOLAX) 5 MG EC tablet Take 1 tablet (5 mg total) by mouth  daily as needed for moderate constipation. 06/20/20  Yes Dahal, Marlowe Aschoff, MD  famotidine-calcium carbonate-magnesium hydroxide (PEPCID COMPLETE) 10-800-165 MG chewable tablet Chew 1 tablet by mouth daily as needed (for acid reflux).   Yes [provider]  HYDROcodone-acetaminophen (NORCO) 10-325 MG tablet Take 1 tablet by mouth every 6 (six) hours.   Yes [provider]  ibuprofen (ADVIL) 200 MG tablet Take 400 mg by mouth every 6 (six) hours as needed for mild pain or headache.   Yes [provider]  lidocaine-prilocaine (EMLA) cream Apply to Port-A-Cath site 1 to 2 hours prior to use Patient taking differently: Apply 1 Application topically See admin instructions. Apply to Port-A-Cath site 1 to 2 hours prior to use 11/05/21  Yes Owens Shark, NP  ondansetron (ZOFRAN) 8 MG tablet TAKE 1 TABLET BY MOUTH EVERY 8 HOURS AS NEEDED FOR NAUSEA FOR VOMITING Patient taking differently: Take 8 mg by mouth every 8 (eight) hours as needed for nausea or vomiting. 11/17/21  Yes Owens Shark, NP  oxyCODONE (OXY IR/ROXICODONE) 5 MG immediate release tablet Take 1-2 tablets (5-10 mg total) by mouth every 6 (six) hours as needed for severe pain. 11/13/21  Yes Owens Shark, NP  prochlorperazine (COMPAZINE) 10 MG tablet Take 1 tablet (10 mg total) by mouth every 6 (six) hours as needed for nausea or vomiting. 08/13/20  Yes Owens Shark, NP  potassium chloride (MICRO-K) 10 MEQ CR capsule Take 1 capsule (10 mEq total) by mouth daily. Patient not taking: Reported on 11/17/2021 09/29/21   Ladell Pier, MD    Physical Exam: Vitals:   11/17/21 1241 11/17/21 1400 11/17/21 1500 11/17/21 1600  BP: (!) 142/105 128/85 131/83 (!) 146/71  Pulse: (!) 104 (!) 101 91 100  Resp: '15 18 18 17  '$ Temp: 98 F (36.7 C)     TempSrc: Oral     SpO2: 99% 99% 96% 95%   General: 61 y.o. chronically ill appearing female resting in bed Eyes: PERRL, ichteric sclera ENMT: Nares patent w/o discharge, orophaynx  clear, dentition normal, ears w/o discharge/lesions/ulcers Neck: Supple, trachea midline Cardiovascular: tachy, +S1, S2, no m/g/r, equal pulses throughout Respiratory: CTABL, no w/r/r, normal WOB GI: BS hypoactive, distended, globally ttp MSK: No e/c/c Neuro: A&O x 3, no focal deficits Psyc: Appropriate interaction and affect, calm/cooperative  Data Reviewed:  Results for orders placed or performed during the hospital encounter of 11/17/21 (from the past 24 hour(s))  CBC with Differential     Status: Abnormal   Collection Time: 11/17/21  1:50 PM  Result Value Ref Range   WBC 21.1 (H) 4.0 - 10.5 K/uL   RBC 3.72 (L) 3.87 - 5.11 MIL/uL   Hemoglobin 11.2 (L) 12.0 - 15.0 g/dL   HCT 32.8 (L) 36.0 - 46.0 %   MCV 88.2 80.0 - 100.0 fL   MCH 30.1 26.0 - 34.0 pg   MCHC 34.1 30.0 - 36.0 g/dL   RDW 20.5 (H) 11.5 - 15.5 %   Platelets 251 150 - 400 K/uL   nRBC 0.0 0.0 - 0.2 %   Neutrophils Relative % 82 %   Neutro Abs 17.5 (H) 1.7 - 7.7 K/uL   Lymphocytes Relative 8 %   Lymphs Abs 1.7 0.7 - 4.0 K/uL   Monocytes Relative 7 %   Monocytes Absolute 1.5 (H) 0.1 - 1.0 K/uL   Eosinophils Relative 1 %   Eosinophils Absolute 0.1 0.0 - 0.5 K/uL   Basophils Relative 0 %   Basophils Absolute 0.1 0.0 - 0.1 K/uL   Immature Granulocytes 2 %   Abs Immature Granulocytes 0.36 (H) 0.00 - 0.07 K/uL  Comprehensive metabolic panel     Status: Abnormal   Collection Time: 11/17/21  1:50 PM  Result Value Ref Range   Sodium 131 (L) 135 - 145 mmol/L   Potassium 3.7 3.5 - 5.1 mmol/L   Chloride 97 (L) 98 - 111 mmol/L   CO2 25 22 - 32 mmol/L   Glucose, Bld 84 70 - 99 mg/dL   BUN 14 8 - 23 mg/dL   Creatinine, Ser 0.57 0.44 - 1.00 mg/dL   Calcium 11.6 (H) 8.9 - 10.3 mg/dL   Total Protein 7.4 6.5 - 8.1 g/dL   Albumin 2.4 (L) 3.5 - 5.0 g/dL   AST 135 (H) 15 - 41 U/L   ALT 55 (H) 0 - 44 U/L   Alkaline Phosphatase 437 (H) 38 - 126 U/L   Total Bilirubin 15.0 (H) 0.3 - 1.2 mg/dL   GFR, Estimated >60 >60 mL/min    Anion gap 9 5 - 15  Lipase, blood     Status: None   Collection Time: 11/17/21  1:50 PM  Result Value Ref Range   Lipase 18 11 - 51 U/L  Resp Panel by RT-PCR (Flu A&B, Covid) Anterior Nasal Swab     Status: None  Collection Time: 11/17/21  3:07 PM   Specimen: Anterior Nasal Swab  Result Value Ref Range   SARS Coronavirus 2 by RT PCR NEGATIVE NEGATIVE   Influenza A by PCR NEGATIVE NEGATIVE   Influenza B by PCR NEGATIVE NEGATIVE   CT ab/pelvis 1. Known pancreatic malignancy. Interval progression of liver metastasis compared to recent prior. 2. Similar intrahepatic biliary ductal dilatation. The distal common bile duct is normal in caliber. 3. Pancreatic tail mass is unchanged or minimally smaller. 4. Increased abdominopelvic ascites, moderate in degree. Peritoneal nodule in the left lower quadrant has slightly increased. 5. Similar to mildly progressed abdominal adenopathy. 6. Bilateral ovarian lesions which are minimally increased in size. 7. Progressive pulmonary metastasis in the lung bases. 8. Equivocal peri pyloric gastric wall thickening.  Assessment and Plan: Intractable abdominal pain Ascites SIRS     - place in obs, tele     - continue rocephin     - US paracentesis     - fluids, follow Cx     - UA is pending     - pain control     - anti-emetics  Normocytic anemia     - at baseline     - no evidence of bleed, follow  Metastatic pancreatitis     - follows w/ Dr. Benay Spice; he's been notified of her admission through secure chat     - palliative care consult  Hyponatremia     - mild, fluids, follow  Hypercalcemia     - secondary to cancer     - fluids, follow  Elevated LFTs Hyperbilirubinemia     - secondary to dehydration and CA/mets     - fluids, follow  Goal of Care     - she needs palliative care options at this point; will consult  Advance Care Planning:   Code Status: DNR  Consults: None  Family Communication: w/ daughter at  bedside  Severity of Illness: The appropriate patient status for this patient is INPATIENT. Inpatient status is judged to be reasonable and necessary in order to provide the required intensity of service to ensure the patient's safety. The patient's presenting symptoms, physical exam findings, and initial radiographic and laboratory data in the context of their chronic comorbidities is felt to place them at high risk for further clinical deterioration. Furthermore, it is not anticipated that the patient will be medically stable for discharge from the hospital within 2 midnights of admission.   * I certify that at the point of admission it is my clinical judgment that the patient will require inpatient hospital care spanning beyond 2 midnights from the point of admission due to high intensity of service, high risk for further deterioration and high frequency of surveillance required.*  Time spent in coordination of this H&P: 60 minutes  Author: Jonnie Finner, DO 11/17/2021 4:49 PM  For on call review [email or web address].

## 2021-11-17 NOTE — Telephone Encounter (Signed)
Mrs. Kirsten [name] daughter called in and cancel her mother appointment for today. She very weak, that she can not get up and walk. In the last couple of days she finally had a three bowel movement. Last BM 11/17/21. Patient pain is a 10, she's taking her pain medication as order. The patient knows to go to the [facility] if the symptoms get worst.

## 2021-11-17 NOTE — ED Provider Notes (Signed)
[facility] DEPT Provider Note   CSN: [id number] Arrival date & time: 11/17/21  1229     History  Chief Complaint  Patient presents with   Abdominal Pain    Kirsten [name] is a 61 y.o. female.  Patient as above with significant medical history as below, including metastatic pancreatic cancer who presents to the ED with complaint of worsening abdominal pain with radiation to her right flank and back.  Pain is progressively worsened over the past week.  She was prescribed narcotics by her oncologist Dr. Benay Spice with minimal improvement to her symptoms.  She was previously undergoing chemotherapy but she completed her last round chemotherapy 7/31 FOLFIRINOX.  Worsening pain past few days, difficulty ambulating, difficulty finding position of comfort, reduced p.o. intake.  Minimal relief with prescribed narcotic pain medications.  Denies fevers, chills, nausea or vomiting, no chest pain or dyspnea, no rashes, no urine changes      Past Medical History:  Diagnosis Date   Family history of pancreatic cancer    Family history of stomach cancer    Family history of uterine cancer     Past Surgical History:  Procedure Laterality Date   IR IMAGING GUIDED PORT INSERTION  06/20/2020     The history is provided by the patient and a relative. No language interpreter was used.  Abdominal Pain Associated symptoms: constipation and fatigue   Associated symptoms: no chest pain, no cough, no dysuria, no fever, no nausea and no shortness of breath        Home Medications Prior to Admission medications   Medication Sig Start Date End Date Taking? Authorizing Provider  Aspirin-Salicylamide-Caffeine (BC HEADACHE POWDER PO) Take 1 packet by mouth daily as needed (PAIN).   Yes [provider]  bisacodyl (DULCOLAX) 5 MG EC tablet Take 1 tablet (5 mg total) by mouth daily as needed for moderate constipation. 06/20/20  Yes Dahal, Marlowe Aschoff, MD   famotidine-calcium carbonate-magnesium hydroxide (PEPCID COMPLETE) 10-800-165 MG chewable tablet Chew 1 tablet by mouth daily as needed (for acid reflux).   Yes [provider]  HYDROcodone-acetaminophen (NORCO) 10-325 MG tablet Take 1 tablet by mouth every 6 (six) hours.   Yes [provider]  ibuprofen (ADVIL) 200 MG tablet Take 400 mg by mouth every 6 (six) hours as needed for mild pain or headache.   Yes [provider]  lidocaine-prilocaine (EMLA) cream Apply to Port-A-Cath site 1 to 2 hours prior to use Patient taking differently: Apply 1 Application topically See admin instructions. Apply to Port-A-Cath site 1 to 2 hours prior to use 11/05/21  Yes Owens Shark, NP  ondansetron (ZOFRAN) 8 MG tablet TAKE 1 TABLET BY MOUTH EVERY 8 HOURS AS NEEDED FOR NAUSEA FOR VOMITING Patient taking differently: Take 8 mg by mouth every 8 (eight) hours as needed for nausea or vomiting. 11/17/21  Yes Owens Shark, NP  oxyCODONE (OXY IR/ROXICODONE) 5 MG immediate release tablet Take 1-2 tablets (5-10 mg total) by mouth every 6 (six) hours as needed for severe pain. 11/13/21  Yes Owens Shark, NP  prochlorperazine (COMPAZINE) 10 MG tablet Take 1 tablet (10 mg total) by mouth every 6 (six) hours as needed for nausea or vomiting. 08/13/20  Yes Owens Shark, NP  potassium chloride (MICRO-K) 10 MEQ CR capsule Take 1 capsule (10 mEq total) by mouth daily. Patient not taking: Reported on 11/17/2021 09/29/21   Ladell Pier, MD      Allergies    Patient  has no known allergies.    Review of Systems   Review of Systems  Constitutional:  Positive for appetite change and fatigue. Negative for activity change and fever.  HENT:  Negative for facial swelling and trouble swallowing.   Eyes:  Negative for discharge and redness.  Respiratory:  Negative for cough and shortness of breath.   Cardiovascular:  Negative for chest pain and palpitations.  Gastrointestinal:  Positive for abdominal  pain and constipation. Negative for nausea.  Genitourinary:  Negative for dysuria and flank pain.  Musculoskeletal:  Positive for arthralgias. Negative for back pain and gait problem.  Skin:  Negative for pallor and rash.  Neurological:  Negative for syncope and headaches.    Physical Exam Updated Vital Signs BP 119/82 (BP Location: Left Arm)   Pulse 97   Temp 98 F (36.7 C) (Oral)   Resp 16   SpO2 98%  Physical Exam Vitals and nursing note reviewed.  Constitutional:      General: She is not in acute distress.    Appearance: Normal appearance. She is not ill-appearing.     Comments: Frail  HENT:     Head: Normocephalic and atraumatic.     Right Ear: External ear normal.     Left Ear: External ear normal.     Nose: Nose normal.     Mouth/Throat:     Mouth: Mucous membranes are moist.  Eyes:     General: Scleral icterus present.        Right eye: No discharge.        Left eye: No discharge.  Cardiovascular:     Rate and Rhythm: Regular rhythm. Tachycardia present.     Pulses: Normal pulses.     Heart sounds: Normal heart sounds.  Pulmonary:     Effort: Pulmonary effort is normal. No respiratory distress.     Breath sounds: Normal breath sounds. Decreased air movement present.  Abdominal:     General: Abdomen is flat.     Tenderness: There is abdominal tenderness in the right upper quadrant and epigastric area. There is no guarding or rebound.    Musculoskeletal:        General: Normal range of motion.       Arms:     Cervical back: Full passive range of motion without pain and normal range of motion.     Right lower leg: No edema.     Left lower leg: No edema.  Skin:    General: Skin is warm and dry.     Capillary Refill: Capillary refill takes less than 2 seconds.  Neurological:     Mental Status: She is alert and oriented to person, place, and time. Mental status is at baseline.     GCS: GCS eye subscore is 4. GCS verbal subscore is 5. GCS motor subscore is 6.      Cranial Nerves: Cranial nerves 2-12 are intact.     Sensory: Sensation is intact.     Motor: Motor function is intact. No tremor.     Coordination: Coordination is intact.     Comments: Gait not tested secondary to patient safety  Psychiatric:        Mood and Affect: Mood normal.        Behavior: Behavior normal.     ED Results / Procedures / Treatments   Labs (all labs ordered are listed, but only abnormal results are displayed) Labs Reviewed  CBC WITH DIFFERENTIAL/PLATELET - Abnormal; Notable for the following components:  Result Value   WBC 21.1 (*)    RBC 3.72 (*)    Hemoglobin 11.2 (*)    HCT 32.8 (*)    RDW 20.5 (*)    Neutro Abs 17.5 (*)    Monocytes Absolute 1.5 (*)    Abs Immature Granulocytes 0.36 (*)    All other components within normal limits  COMPREHENSIVE METABOLIC PANEL - Abnormal; Notable for the following components:   Sodium 131 (*)    Chloride 97 (*)    Calcium 11.6 (*)    Albumin 2.4 (*)    AST 135 (*)    ALT 55 (*)    Alkaline Phosphatase 437 (*)    Total Bilirubin 15.0 (*)    All other components within normal limits  RESP PANEL BY RT-PCR (FLU A&B, COVID) ARPGX2  LIPASE, BLOOD  URINALYSIS, ROUTINE W REFLEX MICROSCOPIC    EKG None  Radiology CT ABDOMEN PELVIS W CONTRAST  Result Date: 11/17/2021 CLINICAL DATA:  Epigastric and right flank/back pain. History of pancreatic malignancy. Active chemotherapy. EXAM: CT ABDOMEN AND PELVIS WITH CONTRAST TECHNIQUE: Multidetector CT imaging of the abdomen and pelvis was performed using the standard protocol following bolus administration of intravenous contrast. RADIATION DOSE REDUCTION: This exam was performed according to the departmental dose-optimization program which includes automated exposure control, adjustment of the mA and/or kV according to patient size and/or use of iterative reconstruction technique. CONTRAST:  193m OMNIPAQUE IOHEXOL 300 MG/ML  SOLN COMPARISON:  CT 12 days ago 11/05/2021  FINDINGS: Lower chest: 1.6 x 1.1 cm left lower lobe nodule, series 4, image 13, unchanged from recent prior. Subpleural 8 x 5 mm left lower lobe nodule, series 4, image 17, slightly increased. There is small nodules in the right lower lobe including 7 mm peri diaphragmatic nodule series 4, image 21, new. 6 mm subpleural right middle lobe nodule series 4, image 9 a previously 4 mm. There are few additional a small nodules in the included lung bases that were not definitively seen on prior. Bandlike atelectasis in the left lower lobe. No pleural effusion. Hepatobiliary: Extensive hepatic metastatic disease again seen. Lesions measured with same caliper placement from prior. Index lesion in the periphery of the right lobe measures 7.2 x 4.7 cm, series 2, image 16 previously 6.5 x 4.3 cm. Index lesion within right lobe segment 5/6 measures 10.1 x 7.9 cm, series 2, image 27, previously 9 x 7.7 cm. Lesion within for AA measures 4.6 x 4 cm, series 2, image 20, previously 3.7 x 2.9 cm. Many of the additional liver lesions have increased in size, with the central liver lesions on prior now coalesced with adjacent liver lesions, likely accounting for biliary dilatation. These lesions are difficult to accurately measure on the current exam. There is intrahepatic biliary ductal dilatation primarily in the right lobe, that was also seen on prior exam. The distal common bile duct is nondilated, for example series 5, image 58. More proximal common bile duct is poorly defined. Gallbladder is difficult to delineate. Pancreas: Mass in the pancreatic tail is again seen. This measures approximately 5.1 x 2.5 cm, series 2, image 30, previously 5.2 x 3 cm. Size differences likely due to differences in caliper placement and scan plane. This lesion infiltrates the medial aspect of the spleen with loss of fat plane. There is no ductal dilatation. No definite peripancreatic inflammatory change. Spleen: Heterogeneous within the inferior  aspect, but no definitive intrasplenic lesions. Adrenals/Urinary Tract: No adrenal nodule. No hydronephrosis. Again seen scarring of  the mid lower left kidney. No evidence of renal stone or suspicious renal lesion. Unremarkable urinary bladder. Stomach/Bowel: Lack of enteric contrast limits detailed bowel assessment. Equivocal Peri pyloric gastric wall thickening. There is no small bowel obstruction or small bowel inflammation. The appendix is not confidently visualized. Small volume of colonic stool. Vascular/Lymphatic: Atherosclerosis of the abdominal aorta. No aneurysm. 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 The intrahepatic left portal vein is patent. The extrahepatic portal vein and SMV are patent. The splenic vein is attenuated, as before. 17 mm gastrohepatic ligament node, series 2, image 28, previously 16 mm. Retrocaval lymph node measures 14 mm, series 2, image 36, unchanged. Reproductive: Heterogeneous appearance of the uterus with areas hypo and increased enhancement, similar to prior. The left ovarian lesion measures 3.7 x 3.8 cm, previously 3.8 x 3.3 cm. The right ovarian lesion measures 4.9 x 2.4 cm, previously 4.4 x 2.6 cm. Other: Increased abdominopelvic ascites, moderate in degree nodule adjacent to the descending colon measures 1.7 cm, series 2, image 66, previously 1.4 cm. Musculoskeletal: Lumbar spine assessed on dedicated lumbar spine reformats, reported separately. No definite bony destructive lesions in the pelvis. No definite subcutaneous nodules. IMPRESSION: 1. Known pancreatic malignancy. Interval progression of liver metastasis compared to recent prior. 2. Similar intrahepatic biliary ductal dilatation. The distal common bile duct is normal in caliber. 3. Pancreatic tail mass is unchanged or minimally smaller. 4. Increased abdominopelvic ascites, moderate in degree. Peritoneal nodule in the left lower quadrant has slightly increased. 5. Similar to mildly  progressed abdominal adenopathy. 6. Bilateral ovarian lesions which are minimally increased in size. 7. Progressive pulmonary metastasis in the lung bases. 8. Equivocal peri pyloric gastric wall thickening. Aortic Atherosclerosis (ICD10-I70.0). Electronically Signed   By: Keith Rake M.D.   On: 11/17/2021 16:06   CT L-SPINE NO CHARGE  Result Date: 11/17/2021 CLINICAL DATA:  Abdominal pain, history of pancreatic cancer EXAM: CT LUMBAR SPINE WITHOUT CONTRAST TECHNIQUE: Multidetector CT imaging of the lumbar spine was performed without intravenous contrast administration. Multiplanar CT image reconstructions were also generated. RADIATION DOSE REDUCTION: This exam was performed according to the departmental dose-optimization program which includes automated exposure control, adjustment of the mA and/or kV according to patient size and/or use of iterative reconstruction technique. COMPARISON:  No prior CT of the lumbar spine, correlation is made with 11/05/2021 CT abdomen pelvis FINDINGS: Segmentation: 5 lumbar-type vertebral bodies. Alignment: Unchanged grade 1 anterolisthesis of L4 on L5. Vertebrae: No acute fracture or suspicious osseous lesion. Paraspinal and other soft tissues: Please see same-day CT abdomen pelvis. Disc levels: L2-L3: Mild disc bulge. Moderate facet arthropathy, right-greater-than-left. No spinal canal stenosis or neural foraminal narrowing. L3-L4: Mild disc bulge. Moderate right-greater-than-left facet arthropathy. Mild spinal canal stenosis and mild bilateral neural foraminal narrowing. L4-L5: Grade 1 anterolisthesis with disc unroofing and moderate disc bulge. Severe right-greater-than-left facet arthropathy. Moderate spinal canal stenosis. Mild bilateral neural foraminal narrowing. L5-S1: Mild disc bulge. Moderate facet arthropathy. No spinal canal stenosis. Moderate left neural foraminal narrowing. IMPRESSION: 1. No acute fracture or suspicious osseous lesion. 2. Degenerative changes  in the lumbar spine with moderate spinal canal stenosis at L4-L5 and mild spinal canal stenosis at L3-L4. Electronically Signed   By: Merilyn Baba M.D.   On: 11/17/2021 15:53    Procedures Procedures    Medications Ordered in ED Medications  cefTRIAXone (ROCEPHIN) 1 g in sodium chloride 0.9 % 100 mL IVPB (has no administration in time range)  HYDROmorphone (DILAUDID) injection 1  mg (has no administration in time range)  sodium chloride 0.9 % bolus 1,000 mL (0 mLs Intravenous Stopped 11/17/21 1646)  HYDROmorphone (DILAUDID) injection 1 mg (1 mg Intravenous Given 11/17/21 1422)  ondansetron (ZOFRAN) injection 4 mg (4 mg Intravenous Given 11/17/21 1422)  iohexol (OMNIPAQUE) 300 MG/ML solution 100 mL (100 mLs Intravenous Contrast Given 11/17/21 1515)    ED Course/ Medical Decision Making/ A&P Clinical Course as of 11/17/21 1749  Mon Nov 17, 2021  1626 IMPRESSION: 1. Known pancreatic malignancy. Interval progression of liver metastasis compared to recent prior. 2. Similar intrahepatic biliary ductal dilatation. The distal common bile duct is normal in caliber. 3. Pancreatic tail mass is unchanged or minimally smaller. 4. Increased abdominopelvic ascites, moderate in degree. Peritoneal nodule in the left lower quadrant has slightly increased. 5. Similar to mildly progressed abdominal adenopathy. 6. Bilateral ovarian lesions which are minimally increased in size. 7. Progressive pulmonary metastasis in the lung bases. 8. Equivocal peri pyloric gastric wall thickening.   [SG]  1657 D/w palliative, will come see in consult  [SG]    Clinical Course User Index [SG] Jeanell Sparrow, DO                           Medical Decision Making Amount and/or Complexity of Data Reviewed Labs: ordered. Radiology: ordered.  Risk Prescription drug management. Decision regarding hospitalization.   This patient presents to the ED with chief complaint(s) of abdominal pain, flank pain, back pain,  weakness with pertinent past medical history of anesthetic pancreatic cancer which further complicates the presenting complaint. The complaint involves an extensive differential diagnosis and also carries with it a high risk of complications and morbidity.    Differential diagnosis includes but is not exclusive to acute cholecystitis, intrathoracic causes for epigastric abdominal pain, gastritis, duodenitis, pancreatitis, small bowel or large bowel obstruction, abdominal aortic aneurysm, hernia, gastritis, etc.  Differential diagnosis includes but is not exclusive to musculoskeletal back pain, renal colic, urinary tract infection, pyelonephritis, intra-abdominal causes of back pain, aortic aneurysm or dissection, cauda equina syndrome, sciatica, lumbar disc disease, thoracic disc disease, etc.  . Serious etiologies were considered.   The initial plan is to screening labs and imaging, analgesics, IV fluids   Additional history obtained: Additional history obtained from family Records reviewed Primary Care Documents and home medications, prior labs and imaging  Independent labs interpretation:  The following labs were independently interpreted:  RVP negative CBC with WC 21.1, hemoglobin 11.2. CMP with mildly depressed sodium 131, creatinine stable. Alk Phos 437, AST 135 ALT 55; total bilirubin is 15 >> Bili was 3.6 3 days ago  Alk phos similar to baseline but AST and ALT are elevated from prior   Independent visualization of imaging: - I independently visualized the following imaging with scope of interpretation limited to determining acute life threatening conditions related to emergency care: CT A/p, CTL, which revealed progression of liver mets, pancreatic malignancy, similar biliary ductal dilation from prior per radiology, CBD is normal in caliber. Mildly increasing ascites  Cardiac monitoring was reviewed and interpreted by myself which shows initially sinus tachycardia now  NSR  Treatment and Reassessment: Analgesics, IV fluids, antiemetic >> Symptoms improving, able to sleep comfortably   Consultation: - Consulted or discussed management/test interpretation w/ external professional: palliative, hospitalist  Consideration for admission or further workup: Admission was considered   61 yo female w/ metastatic pancreatic cancer to ED for intractable pain not controlled with her home  opiates, weakness. Workup in ED with WBC elevated, tachycardia; SIRS but no clear source of infection. She has ascites so did cover with rocephin, do not believe she has a safely accessible ascites pocket for paracentesis. Would defer to IR if family is amenable to para. However family is requesting palliative consult and are considering transitioning to hospice. Will plan to admit patient at this time for pain control and for formal palliative consult, will continue IV abx in case family would like para but at this point does not appear to be c/w patient/family's current wishes. D/w palliative who will come see in consult. D/w Dr Marylyn Ishihara who accepts pt for admit. She is [facility] Family ideally like to do home hospice if possible, will defer further management to palliative team concerning this.   Social Determinants of health:            Final Clinical Impression(s) / ED Diagnoses Final diagnoses:  Metastatic malignant neoplasm, unspecified site ([place])  SIRS (systemic inflammatory response syndrome) ([place])  Hyperbilirubinemia    Rx / DC Orders ED Discharge Orders     None         Jeanell Sparrow, DO 11/17/21 1749

## 2021-11-17 NOTE — ED Triage Notes (Signed)
Pt BIB EMS from home, c/o abdominal pain. Hx of pancreatic cancer. Pt was going for round of chemo but the pain was too severe. 10/10 right upper quadrant pain. Given oxycodone this AM and hydrocodone at 1140 with no relief. VSS CBG 85

## 2021-11-18 DIAGNOSIS — R52 Pain, unspecified: Secondary | ICD-10-CM | POA: Diagnosis not present

## 2021-11-18 LAB — CBC
HCT: 30.8 % — ABNORMAL LOW (ref 36.0–46.0)
Hemoglobin: 10.3 g/dL — ABNORMAL LOW (ref 12.0–15.0)
MCH: 29.9 pg (ref 26.0–34.0)
MCHC: 33.4 g/dL (ref 30.0–36.0)
MCV: 89.5 fL (ref 80.0–100.0)
Platelets: 221 10*3/uL (ref 150–400)
RBC: 3.44 MIL/uL — ABNORMAL LOW (ref 3.87–5.11)
RDW: 20.8 % — ABNORMAL HIGH (ref 11.5–15.5)
WBC: 19.2 10*3/uL — ABNORMAL HIGH (ref 4.0–10.5)
nRBC: 0 % (ref 0.0–0.2)

## 2021-11-18 LAB — COMPREHENSIVE METABOLIC PANEL
ALT: 56 U/L — ABNORMAL HIGH (ref 0–44)
AST: 130 U/L — ABNORMAL HIGH (ref 15–41)
Albumin: 2.1 g/dL — ABNORMAL LOW (ref 3.5–5.0)
Alkaline Phosphatase: 383 U/L — ABNORMAL HIGH (ref 38–126)
Anion gap: 9 (ref 5–15)
BUN: 13 mg/dL (ref 8–23)
CO2: 24 mmol/L (ref 22–32)
Calcium: 11.2 mg/dL — ABNORMAL HIGH (ref 8.9–10.3)
Chloride: 100 mmol/L (ref 98–111)
Creatinine, Ser: 0.64 mg/dL (ref 0.44–1.00)
GFR, Estimated: >60 mL/min (ref >60)
Glucose, Bld: 77 mg/dL (ref 70–99)
Potassium: 3.9 mmol/L (ref 3.5–5.1)
Sodium: 133 mmol/L — ABNORMAL LOW (ref 135–145)
Total Bilirubin: 14.1 mg/dL — ABNORMAL HIGH (ref 0.3–1.2)
Total Protein: 6.4 g/dL — ABNORMAL LOW (ref 6.5–8.1)

## 2021-11-18 LAB — HIV ANTIBODY (ROUTINE TESTING W REFLEX): HIV Screen 4th Generation wRfx: NONREACTIVE

## 2021-11-18 MED ORDER — HYDROMORPHONE HCL 1 MG/ML IJ SOLN
1.0000 mg | INTRAMUSCULAR | Status: DC | PRN
  Administered 2021-11-18: 1 mg via INTRAVENOUS
  Filled 2021-11-18: qty 1

## 2021-11-18 MED ORDER — ZOLEDRONIC ACID 4 MG/5ML IV CONC
4.0000 mg | Freq: Once | INTRAVENOUS | Status: AC
  Administered 2021-11-18: 4 mg via INTRAVENOUS
  Filled 2021-11-18: qty 5

## 2021-11-18 MED ORDER — CHLORHEXIDINE GLUCONATE CLOTH 2 % EX PADS
6.0000 | MEDICATED_PAD | Freq: Every day | CUTANEOUS | Status: DC
  Administered 2021-11-18 – 2021-11-19 (×2): 6 via TOPICAL

## 2021-11-18 MED ORDER — MORPHINE SULFATE (CONCENTRATE) 10 MG/0.5ML PO SOLN
10.0000 mg | ORAL | Status: DC | PRN
  Administered 2021-11-18 – 2021-11-19 (×3): 10 mg via SUBLINGUAL
  Administered 2021-11-19: 20 mg via SUBLINGUAL
  Administered 2021-11-19: 10 mg via SUBLINGUAL
  Administered 2021-11-20 (×2): 20 mg via SUBLINGUAL
  Filled 2021-11-18: qty 1
  Filled 2021-11-18: qty 0.5
  Filled 2021-11-18 (×2): qty 1
  Filled 2021-11-18 (×3): qty 0.5

## 2021-11-18 MED ORDER — LORAZEPAM 2 MG/ML IJ SOLN
0.5000 mg | INTRAMUSCULAR | Status: DC | PRN
  Administered 2021-11-19 – 2021-11-20 (×3): 0.5 mg via INTRAVENOUS
  Filled 2021-11-18 (×3): qty 1

## 2021-11-18 MED ORDER — LIP MEDEX EX OINT
TOPICAL_OINTMENT | CUTANEOUS | Status: DC | PRN
  Administered 2021-11-18: 1 via TOPICAL
  Filled 2021-11-18: qty 7

## 2021-11-18 NOTE — Progress Notes (Signed)
IP PROGRESS NOTE  Subjective:   [name] is well-known to me with a history of metastatic pancreas cancer.  She has recently developed failure to thrive, increased pain, and marked hyperbilirubinemia.  Review of imaging studies with interventional radiology has not revealed a lesion amenable to stenting.  She presented to the emergency room yesterday with increased abdominal pain.  Her daughter reports her appetite has diminished over the past several days.  She has been confused.  Pain has not been relieved with oxycodone or hydrocodone.  Her daughter is at the bedside this morning.  Her son is present by telephone.  [name] reports abdominal pain.  Her daughter reports the pain has been relieved with IV hydromorphone.  Objective: Vital signs in last 24 hours: Blood pressure 122/79, pulse 92, temperature (!) 97.5 F (36.4 C), temperature source Oral, resp. rate 16, SpO2 99 %.  Intake/Output from previous day: 09/18 0701 - 09/19 0700 In: 1771.4 [I.V.:771.4; IV Piggyback:1000] Out: 200 [Urine:200]  Physical Exam:  HEENT: Scleral icterus, no thrush Lungs: Clear anteriorly, no respiratory distress Cardiac: Regular rate and rhythm Abdomen: Distended, tender in the right upper abdomen Extremities: No leg edema Neurologic: Alert, follows some commands, not communicating in sentences  Portacath/PICC-without erythema  Lab Results: Recent Labs    11/17/21 1350 11/18/21 0500  WBC 21.1* 19.2*  HGB 11.2* 10.3*  HCT 32.8* 30.8*  PLT 251 221    BMET Recent Labs    11/17/21 1350 11/18/21 0500  NA 131* 133*  K 3.7 3.9  CL 97* 100  CO2 25 24  GLUCOSE 84 77  BUN 14 13  CREATININE 0.57 0.64  CALCIUM 11.6* 11.2*    Lab Results  Component Value Date   CAN199 44,866 (H) 10/17/2021    Studies/Results: CT ABDOMEN PELVIS W CONTRAST  Result Date: 11/17/2021 CLINICAL DATA:  Epigastric and right flank/back pain. History of pancreatic malignancy. Active chemotherapy.  EXAM: CT ABDOMEN AND PELVIS WITH CONTRAST TECHNIQUE: Multidetector CT imaging of the abdomen and pelvis was performed using the standard protocol following bolus administration of intravenous contrast. RADIATION DOSE REDUCTION: This exam was performed according to the departmental dose-optimization program which includes automated exposure control, adjustment of the mA and/or kV according to patient size and/or use of iterative reconstruction technique. CONTRAST:  162m OMNIPAQUE IOHEXOL 300 MG/ML  SOLN COMPARISON:  CT 12 days ago 11/05/2021 FINDINGS: Lower chest: 1.6 x 1.1 cm left lower lobe nodule, series 4, image 13, unchanged from recent prior. Subpleural 8 x 5 mm left lower lobe nodule, series 4, image 17, slightly increased. There is small nodules in the right lower lobe including 7 mm peri diaphragmatic nodule series 4, image 21, new. 6 mm subpleural right middle lobe nodule series 4, image 9 a previously 4 mm. There are few additional a small nodules in the included lung bases that were not definitively seen on prior. Bandlike atelectasis in the left lower lobe. No pleural effusion. Hepatobiliary: Extensive hepatic metastatic disease again seen. Lesions measured with same caliper placement from prior. Index lesion in the periphery of the right lobe measures 7.2 x 4.7 cm, series 2, image 16 previously 6.5 x 4.3 cm. Index lesion within right lobe segment 5/6 measures 10.1 x 7.9 cm, series 2, image 27, previously 9 x 7.7 cm. Lesion within for AA measures 4.6 x 4 cm, series 2, image 20, previously 3.7 x 2.9 cm. Many of the additional liver lesions have increased in size, with the central liver lesions on prior now  coalesced with adjacent liver lesions, likely accounting for biliary dilatation. These lesions are difficult to accurately measure on the current exam. There is intrahepatic biliary ductal dilatation primarily in the right lobe, that was also seen on prior exam. The distal common bile duct is  nondilated, for example series 5, image 58. More proximal common bile duct is poorly defined. Gallbladder is difficult to delineate. Pancreas: Mass in the pancreatic tail is again seen. This measures approximately 5.1 x 2.5 cm, series 2, image 30, previously 5.2 x 3 cm. Size differences likely due to differences in caliper placement and scan plane. This lesion infiltrates the medial aspect of the spleen with loss of fat plane. There is no ductal dilatation. No definite peripancreatic inflammatory change. Spleen: Heterogeneous within the inferior aspect, but no definitive intrasplenic lesions. Adrenals/Urinary Tract: No adrenal nodule. No hydronephrosis. Again seen scarring of the mid lower left kidney. No evidence of renal stone or suspicious renal lesion. Unremarkable urinary bladder. Stomach/Bowel: Lack of enteric contrast limits detailed bowel assessment. Equivocal Peri pyloric gastric wall thickening. There is no small bowel obstruction or small bowel inflammation. The appendix is not confidently visualized. Small volume of colonic stool. Vascular/Lymphatic: Atherosclerosis of the abdominal aorta. No aneurysm. 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 The intrahepatic left portal vein is patent. The extrahepatic portal vein and SMV are patent. The splenic vein is attenuated, as before. 17 mm gastrohepatic ligament node, series 2, image 28, previously 16 mm. Retrocaval lymph node measures 14 mm, series 2, image 36, unchanged. Reproductive: Heterogeneous appearance of the uterus with areas hypo and increased enhancement, similar to prior. The left ovarian lesion measures 3.7 x 3.8 cm, previously 3.8 x 3.3 cm. The right ovarian lesion measures 4.9 x 2.4 cm, previously 4.4 x 2.6 cm. Other: Increased abdominopelvic ascites, moderate in degree nodule adjacent to the descending colon measures 1.7 cm, series 2, image 66, previously 1.4 cm. Musculoskeletal: Lumbar spine assessed on dedicated  lumbar spine reformats, reported separately. No definite bony destructive lesions in the pelvis. No definite subcutaneous nodules. IMPRESSION: 1. Known pancreatic malignancy. Interval progression of liver metastasis compared to recent prior. 2. Similar intrahepatic biliary ductal dilatation. The distal common bile duct is normal in caliber. 3. Pancreatic tail mass is unchanged or minimally smaller. 4. Increased abdominopelvic ascites, moderate in degree. Peritoneal nodule in the left lower quadrant has slightly increased. 5. Similar to mildly progressed abdominal adenopathy. 6. Bilateral ovarian lesions which are minimally increased in size. 7. Progressive pulmonary metastasis in the lung bases. 8. Equivocal peri pyloric gastric wall thickening. Aortic Atherosclerosis (ICD10-I70.0). Electronically Signed   By: Keith Rake M.D.   On: 11/17/2021 16:06   CT L-SPINE NO CHARGE  Result Date: 11/17/2021 CLINICAL DATA:  Abdominal pain, history of pancreatic cancer EXAM: CT LUMBAR SPINE WITHOUT CONTRAST TECHNIQUE: Multidetector CT imaging of the lumbar spine was performed without intravenous contrast administration. Multiplanar CT image reconstructions were also generated. RADIATION DOSE REDUCTION: This exam was performed according to the departmental dose-optimization program which includes automated exposure control, adjustment of the mA and/or kV according to patient size and/or use of iterative reconstruction technique. COMPARISON:  No prior CT of the lumbar spine, correlation is made with 11/05/2021 CT abdomen pelvis FINDINGS: Segmentation: 5 lumbar-type vertebral bodies. Alignment: Unchanged grade 1 anterolisthesis of L4 on L5. Vertebrae: No acute fracture or suspicious osseous lesion. Paraspinal and other soft tissues: Please see same-day CT abdomen pelvis. Disc levels: L2-L3: Mild disc bulge. Moderate facet arthropathy,  right-greater-than-left. No spinal canal stenosis or neural foraminal narrowing. L3-L4:  Mild disc bulge. Moderate right-greater-than-left facet arthropathy. Mild spinal canal stenosis and mild bilateral neural foraminal narrowing. L4-L5: Grade 1 anterolisthesis with disc unroofing and moderate disc bulge. Severe right-greater-than-left facet arthropathy. Moderate spinal canal stenosis. Mild bilateral neural foraminal narrowing. L5-S1: Mild disc bulge. Moderate facet arthropathy. No spinal canal stenosis. Moderate left neural foraminal narrowing. IMPRESSION: 1. No acute fracture or suspicious osseous lesion. 2. Degenerative changes in the lumbar spine with moderate spinal canal stenosis at L4-L5 and mild spinal canal stenosis at L3-L4. Electronically Signed   By: Merilyn Baba M.D.   On: 11/17/2021 15:53    Medications: I have reviewed the patient's current medications.  Assessment/Plan: Metastatic pancreas cancer Right upper quadrant ultrasound 06/17/2020-multiple hypoechoic liver lesions CT abdomen/pelvis 06/17/2020- pancreas tail mass, multiple liver metastases, enlarged periaortic nodes, right ovary metastasis?,  Nodularity at the left paracolic gutter and posterior/inferior stomach suggesting peritoneal tumor spread CT chest 06/17/2020- scattered small pulmonary nodules consistent with metastatic disease Ultrasound-guided biopsy of a left liver mass 06/18/2020- adenocarcinoma, morphology compatible with metastatic pancreatic adenocarcinoma, MSS, tumor mutation burden 0 Cycle 1 FOLFOX 07/01/2020 Cycle 2 FOLFOX 07/16/2020 Cycle 3 FOLFOX 07/31/2020 Cycle 4 FOLFIRINOX 08/13/2020 Cycle 5 FOLFIRINOX 09/03/2020, Udenyca CTs 09/13/2020-unchanged nodule medial left lower lobe; stable nodal and likely peritoneal disease in the abdomen/pelvis; unchanged pancreatic tail mass; multifocal hepatic metastases, mildly progressive Cycle 6 FOLFIRINOX 09/16/2020, Oxaliplatin held Cycle 7 FOLFIRINOX 09/30/2020 Cycle 8 FOLFIRINOX 10/14/2020, oxaliplatin held Cycle 9 FOLFIRINOX 10/28/2020, oxaliplatin held Cycle 10  FOLFIRINOX 11/11/2020, oxaliplatin held Cycle 11 FOLFIRINOX 11/25/2020, oxaliplatin held CTs 12/06/2020-primary pancreatic tail tumor decreased in size.  Widespread liver metastases all decreased.  Previously visualized peritoneal nodule adjacent to the distal stomach resolved.  Left lower lobe pulmonary nodule stable.  No new or progressive metastatic disease. Cycle 12 FOLFIRI 12/09/2020 Cycle 13 FOLFIRI 12/23/2020 Cycle 14 FOLFIRI 01/06/2021 Cycle 15 FOLFIRI 01/20/2021 Cycle 16 FOLFIRI 02/03/2021 Cycle 17 FOLFIRI 02/17/2021 CTs 02/27/2021-slight decrease in size of pancreas tail mass and hypodense liver lesions, unchanged left lower lobe nodule, enlargement of a single peritoneal nodule anterior to the distal descending colon Cycle 18 FOLFIRI 03/04/2021 Cycle 19 FOLFIRI 03/17/2021 Cycle 20 FOLFIRI 03/31/2021 Cycle 21 FOLFIRI 04/14/2021 CTs 04/24/2021-unchanged nodule left lower lobe.  Numerous hypodense liver lesions, a few of which are clearly increased in size.  Unchanged mass pancreatic tail. Cycle 1 gemcitabine/Abraxane 05/05/2021 Cycle 2 gemcitabine/Abraxane 05/19/2021 Cycle 3 gemcitabine/Abraxane 06/02/2021 Cycle 4 gemcitabine/Abraxane 06/16/2021 Cycle 5 gemcitabine/Abraxane 06/30/2021 CTs 07/11/2021-enlargement of pancreas tail mass and liver metastases, stable left lower lobe nodule, stable left lower quadrant omental nodule, stable bilateral ovarian lesions-not definitely simple cysts Cycle 1 FOLFIRINOX 07/21/2021 Cycle 2 FOLFIRINOX 08/05/2021 Cycle 3 FOLFIRINOX 08/21/2021, oxaliplatin dose reduced due to neuropathy Cycle 4 FOLFIRINOX 09/01/2021 Cycle 5 FOLFIRINOX 09/15/2021 Cycle 6 FOLFIRINOX 09/29/2021 CTs 10/17/2021-numerous new and enlarged hypoenhancing lesions throughout the liver; new associated segmental intra hepatic biliary ductal dilatation of the right lobe; gallbladder again appears to be directly involved by adjacent metastatic lesions and is difficult to clearly distinguish; interval  enlargement of gastrohepatic ligament and portacaval lymph nodes; slightly diminished size and fullness of the mass of the pancreatic tail; stable or minimally enlarged nodule anterior to the distal descending colon; unchanged bilateral ovarian lesions; unchanged left lower lobe pulmonary nodule.   2.  Pain secondary to #1-improved 3.  Weight loss-improved 4.  Family history of pancreas cancer 5.  Neutropenia secondary chemotherapy-G-CSF added with cycle 5 FOLFIRINOX 6.  Oxaliplatin neuropathy-oxaliplatin held 09/16/2020, 10/14/2020, 10/28/2020, 11/11/2020, 11/25/2020 7.  Admission 11/17/2021 with intractable pain 8.  Hypercalcemia secondary to metastatic pancreas cancer   Kirsten [name] has metastatic pancreas cancer.  She is admitted with failure to thrive, hypercalcemia, and increased pain.  The CT abdomen/pelvis 11/17/2021 confirmed disease progression in multiple sites.  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  She has a mild to moderate degree of ascites.  I doubt she will benefit from a paracentesis.  I discussed the poor prognosis of Ms. Hickmon and her family.  I estimate her lifespan will be measured in days to a few weeks.  She agrees to hospice care.  Her family would like to take her home with hospice care.  Discussed CPR and ACLS.  She agrees to no CODE BLUE status.  She is a candidate for [employer] if her family is unable to manage her care in the home  I have previously reviewed imaging studies with interventional radiology.  She is not a candidate for a percutaneous biliary drain.  She has hypercalcemia of malignancy.  I recommend treating the hypercalcemia with 1 dose of biphosphonate therapy.  Hopefully this will allow her to communicate with her family during her remaining days.  Recommendations: Continue intravenous hydration, Zometa Cancel MRI/MRCP [facility] referral for home hospice care No CODE  BLUE Narcotic pain control, continue IV Dilaudid, I will add Roxanol Cancel paracentesis-she does not appear to have a significant volume of ascites I will plan to follow her with the home hospice team     LOS: 1 day   [name], MD   11/18/2021, 6:45 AM

## 2021-11-18 NOTE — Progress Notes (Signed)
[id number]) Hospital Liaison Note   Received request from Transitions of Care Manager, Patsi Sears, for hospice services at home after discharge.    Spoke with patient's daughter, Governor Specking to initiate education related to hospice philosophy, services, and team approach to care. [place] verbalized understanding of information given.    DME needs discussed. Patient has the following equipment in the home: Hospital bed, wheel chair, bedside commode, shower bench Patient requests the following equipment for delivery: Overbed table   Address verified and is correct in the chart. Governor Specking is the family member to contact to arrange time of equipment delivery.    Please send signed and completed DNR home with patient/family. Please provide prescriptions at discharge as needed to ensure ongoing symptom management.    [facility] information and contact numbers given to family & above information shared with TOC.   Please call with any questions/concerns.    Thank you for the opportunity to participate in this patient's care.   Zigmund Gottron  [facility] Liaison  [phone]

## 2021-11-18 NOTE — Progress Notes (Signed)
PROGRESS NOTE    Kirsten [name]  VZD:[id number] DOB: 1960/11/11 DOA: 11/17/2021 PCP: Pcp, No    Brief Narrative:  61 year old with metastatic pancreatic cancer treated with chemotherapy, recently worsening abdominal pain and appetite, pain not managed with oral pain medications brought to the ER due to ongoing pain, abdominal pain and distention, not taking oral intake. 9/19 hospice care initiated, will go home after arrangements are made.   Assessment & Plan:   Metastatic pancreatic cancer Intractable abdominal pain due to metastatic cancer Hyponatremia, hypercalcemia Elevated bilirubin secondary to metastatic disease End-of-life Failure to thrive  Plan: Patient at end-of-life, a shared decision making with patient's family and oncology. Plan is to go home with home hospice care.  Patient has good support system at home and she wants to stay home. While in the hospital, started on oral concentrated morphine, benzodiazepines for symptom relief.  Continue IV Dilaudid today for pain control. Family to receive DME supplies, rearrange her room for her comfort. We will continue low-dose maintenance IV fluids today. RN to pronounce death if happens in the hospital. Discontinue MRI orders.  Discontinue antibiotics, no benefits. Will not do paracentesis, currently not beneficial for comfort. Discharge home when family is ready.  DVT prophylaxis:   Comfort care   Code Status: Comfort care/DNR Family Communication: Daughter at the bedside.  Nephew at the bedside. Disposition Plan: Status is: Inpatient Remains inpatient appropriate because: Arranging home hospice to go home.     Consultants:  Oncology Palliative and hospice  Procedures:  None  Antimicrobials:  Rocephin, discontinued   Subjective: Patient seen and examined.  Daughter was trying to feed her.  Patient was very lethargic and tired.  She was pocketing some food and was having hard time swallowing. Discussed  with daughter that we will continue to work on pain management and see how she does in the hospital. Family needs to rearrange her room, rearrange DME's and hospital bed before she is discharged which we agreed.  Objective: Vitals:   11/17/21 1600 11/17/21 1711 11/17/21 2051 11/18/21 0433  BP: (!) 146/71 119/82 125/77 122/79  Pulse: 100 97 88 92  Resp: '17 16 16 16  '$ Temp:  98 F (36.7 C) 97.8 F (36.6 C) (!) 97.5 F (36.4 C)  TempSrc:  Oral Oral Oral  SpO2: 95% 98% 100% 99%    Intake/Output Summary (Last 24 hours) at 11/18/2021 1253 Last data filed at 11/18/2021 0954 Gross per 24 hour  Intake 2126.38 ml  Output 200 ml  Net 1926.38 ml   There were no vitals filed for this visit.  Examination:  General: Sick looking.  Frail and debilitated.  On room air.  Deeply icteric. Not talking much.  Flat affect and anxious. Cardiovascular: S1-S2 normal.  Regular rate rhythm. Respiratory: Bilateral clear.  No added sounds.  On room air today. Gastrointestinal: Soft.  Mildly distended but no localized tenderness.  Mild diffuse tenderness present.     Data Reviewed: I have personally reviewed following labs and imaging studies  CBC: Recent Labs  Lab 11/17/21 1350 11/18/21 0500  WBC 21.1* 19.2*  NEUTROABS 17.5*  --   HGB 11.2* 10.3*  HCT 32.8* 30.8*  MCV 88.2 89.5  PLT 251 295   Basic Metabolic Panel: Recent Labs  Lab 11/17/21 1350 11/18/21 0500  NA 131* 133*  K 3.7 3.9  CL 97* 100  CO2 25 24  GLUCOSE 84 77  BUN 14 13  CREATININE 0.57 0.64  CALCIUM 11.6* 11.2*   GFR: Estimated Creatinine  Clearance: 59.5 mL/min (by C-G formula based on SCr of 0.64 mg/dL). Liver Function Tests: Recent Labs  Lab 11/17/21 1350 11/18/21 0500  AST 135* 130*  ALT 55* 56*  ALKPHOS 437* 383*  BILITOT 15.0* 14.1*  PROT 7.4 6.4*  ALBUMIN 2.4* 2.1*   Recent Labs  Lab 11/17/21 1350  LIPASE 18   No results for input(s): "AMMONIA" in the last 168 hours. Coagulation Profile: No  results for input(s): "INR", "PROTIME" in the last 168 hours. Cardiac Enzymes: No results for input(s): "CKTOTAL", "CKMB", "CKMBINDEX", "TROPONINI" in the last 168 hours. BNP (last 3 results) No results for input(s): "PROBNP" in the last 8760 hours. HbA1C: No results for input(s): "HGBA1C" in the last 72 hours. CBG: No results for input(s): "GLUCAP" in the last 168 hours. Lipid Profile: No results for input(s): "CHOL", "HDL", "LDLCALC", "TRIG", "CHOLHDL", "LDLDIRECT" in the last 72 hours. Thyroid Function Tests: No results for input(s): "TSH", "T4TOTAL", "FREET4", "T3FREE", "THYROIDAB" in the last 72 hours. Anemia Panel: No results for input(s): "VITAMINB12", "FOLATE", "FERRITIN", "TIBC", "IRON", "RETICCTPCT" in the last 72 hours. Sepsis Labs: No results for input(s): "PROCALCITON", "LATICACIDVEN" in the last 168 hours.  Recent Results (from the past 240 hour(s))  Resp Panel by RT-PCR (Flu A&B, Covid) Anterior Nasal Swab     Status: None   Collection Time: 11/17/21  3:07 PM   Specimen: Anterior Nasal Swab  Result Value Ref Range Status   SARS Coronavirus 2 by RT PCR NEGATIVE NEGATIVE Final    Comment: (NOTE) SARS-CoV-2 target nucleic acids are NOT DETECTED.  The SARS-CoV-2 RNA is generally detectable in upper respiratory specimens during the acute phase of infection. The lowest concentration of SARS-CoV-2 viral copies this assay can detect is 138 copies/mL. A negative result does not preclude SARS-Cov-2 infection and should not be used as the sole basis for treatment or other patient management decisions. A negative result may occur with  improper specimen collection/handling, submission of specimen other than nasopharyngeal swab, presence of viral mutation(s) within the areas targeted by this assay, and inadequate number of viral copies(<138 copies/mL). A negative result must be combined with clinical observations, patient history, and epidemiological information. The expected  result is Negative.  Fact Sheet for Patients:  EntrepreneurPulse.com.au  Fact Sheet for Healthcare Providers:  IncredibleEmployment.be  This test is no t yet approved or cleared by the [place] FDA and  has been authorized for detection and/or diagnosis of SARS-CoV-2 by FDA under an Emergency Use Authorization (EUA). This EUA will remain  in effect (meaning this test can be used) for the duration of the COVID-19 declaration under Section 564(b)(1) of the Act, 21 U.S.C.section 360bbb-3(b)(1), unless the authorization is terminated  or revoked sooner.       Influenza A by PCR NEGATIVE NEGATIVE Final   Influenza B by PCR NEGATIVE NEGATIVE Final    Comment: (NOTE) The Xpert Xpress SARS-CoV-2/FLU/RSV plus assay is intended as an aid in the diagnosis of influenza from Nasopharyngeal swab specimens and should not be used as a sole basis for treatment. Nasal washings and aspirates are unacceptable for Xpert Xpress SARS-CoV-2/FLU/RSV testing.  Fact Sheet for Patients: EntrepreneurPulse.com.au  Fact Sheet for Healthcare Providers: IncredibleEmployment.be  This test is not yet approved or cleared by the [place] FDA and has been authorized for detection and/or diagnosis of SARS-CoV-2 by FDA under an Emergency Use Authorization (EUA). This EUA will remain in effect (meaning this test can be used) for the duration of the COVID-19 declaration under Section 564(b)(1)  of the Act, 21 U.S.C. section 360bbb-3(b)(1), unless the authorization is terminated or revoked.  Performed at [facility], [place] [address], [place], [place] [address]          Radiology Studies: CT ABDOMEN PELVIS W CONTRAST  Result Date: 11/17/2021 CLINICAL DATA:  Epigastric and right flank/back pain. History of pancreatic malignancy. Active chemotherapy. EXAM: CT ABDOMEN AND PELVIS WITH CONTRAST TECHNIQUE:  Multidetector CT imaging of the abdomen and pelvis was performed using the standard protocol following bolus administration of intravenous contrast. RADIATION DOSE REDUCTION: This exam was performed according to the departmental dose-optimization program which includes automated exposure control, adjustment of the mA and/or kV according to patient size and/or use of iterative reconstruction technique. CONTRAST:  176m OMNIPAQUE IOHEXOL 300 MG/ML  SOLN COMPARISON:  CT 12 days ago 11/05/2021 FINDINGS: Lower chest: 1.6 x 1.1 cm left lower lobe nodule, series 4, image 13, unchanged from recent prior. Subpleural 8 x 5 mm left lower lobe nodule, series 4, image 17, slightly increased. There is small nodules in the right lower lobe including 7 mm peri diaphragmatic nodule series 4, image 21, new. 6 mm subpleural right middle lobe nodule series 4, image 9 a previously 4 mm. There are few additional a small nodules in the included lung bases that were not definitively seen on prior. Bandlike atelectasis in the left lower lobe. No pleural effusion. Hepatobiliary: Extensive hepatic metastatic disease again seen. Lesions measured with same caliper placement from prior. Index lesion in the periphery of the right lobe measures 7.2 x 4.7 cm, series 2, image 16 previously 6.5 x 4.3 cm. Index lesion within right lobe segment 5/6 measures 10.1 x 7.9 cm, series 2, image 27, previously 9 x 7.7 cm. Lesion within for AA measures 4.6 x 4 cm, series 2, image 20, previously 3.7 x 2.9 cm. Many of the additional liver lesions have increased in size, with the central liver lesions on prior now coalesced with adjacent liver lesions, likely accounting for biliary dilatation. These lesions are difficult to accurately measure on the current exam. There is intrahepatic biliary ductal dilatation primarily in the right lobe, that was also seen on prior exam. The distal common bile duct is nondilated, for example series 5, image 58. More proximal  common bile duct is poorly defined. Gallbladder is difficult to delineate. Pancreas: Mass in the pancreatic tail is again seen. This measures approximately 5.1 x 2.5 cm, series 2, image 30, previously 5.2 x 3 cm. Size differences likely due to differences in caliper placement and scan plane. This lesion infiltrates the medial aspect of the spleen with loss of fat plane. There is no ductal dilatation. No definite peripancreatic inflammatory change. Spleen: Heterogeneous within the inferior aspect, but no definitive intrasplenic lesions. Adrenals/Urinary Tract: No adrenal nodule. No hydronephrosis. Again seen scarring of the mid lower left kidney. No evidence of renal stone or suspicious renal lesion. Unremarkable urinary bladder. Stomach/Bowel: Lack of enteric contrast limits detailed bowel assessment. Equivocal Peri pyloric gastric wall thickening. There is no small bowel obstruction or small bowel inflammation. The appendix is not confidently visualized. Small volume of colonic stool. Vascular/Lymphatic: Atherosclerosis of the abdominal aorta. No aneurysm. 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 The intrahepatic left portal vein is patent. The extrahepatic portal vein and SMV are patent. The splenic vein is attenuated, as before. 17 mm gastrohepatic ligament node, series 2, image 28, previously 16 mm. Retrocaval lymph node measures 14 mm, series 2, image 36, unchanged.  Reproductive: Heterogeneous appearance of the uterus with areas hypo and increased enhancement, similar to prior. The left ovarian lesion measures 3.7 x 3.8 cm, previously 3.8 x 3.3 cm. The right ovarian lesion measures 4.9 x 2.4 cm, previously 4.4 x 2.6 cm. Other: Increased abdominopelvic ascites, moderate in degree nodule adjacent to the descending colon measures 1.7 cm, series 2, image 66, previously 1.4 cm. Musculoskeletal: Lumbar spine assessed on dedicated lumbar spine reformats, reported separately. No definite  bony destructive lesions in the pelvis. No definite subcutaneous nodules. IMPRESSION: 1. Known pancreatic malignancy. Interval progression of liver metastasis compared to recent prior. 2. Similar intrahepatic biliary ductal dilatation. The distal common bile duct is normal in caliber. 3. Pancreatic tail mass is unchanged or minimally smaller. 4. Increased abdominopelvic ascites, moderate in degree. Peritoneal nodule in the left lower quadrant has slightly increased. 5. Similar to mildly progressed abdominal adenopathy. 6. Bilateral ovarian lesions which are minimally increased in size. 7. Progressive pulmonary metastasis in the lung bases. 8. Equivocal peri pyloric gastric wall thickening. Aortic Atherosclerosis (ICD10-I70.0). Electronically Signed   By: Keith Rake M.D.   On: 11/17/2021 16:06   CT L-SPINE NO CHARGE  Result Date: 11/17/2021 CLINICAL DATA:  Abdominal pain, history of pancreatic cancer EXAM: CT LUMBAR SPINE WITHOUT CONTRAST TECHNIQUE: Multidetector CT imaging of the lumbar spine was performed without intravenous contrast administration. Multiplanar CT image reconstructions were also generated. RADIATION DOSE REDUCTION: This exam was performed according to the departmental dose-optimization program which includes automated exposure control, adjustment of the mA and/or kV according to patient size and/or use of iterative reconstruction technique. COMPARISON:  No prior CT of the lumbar spine, correlation is made with 11/05/2021 CT abdomen pelvis FINDINGS: Segmentation: 5 lumbar-type vertebral bodies. Alignment: Unchanged grade 1 anterolisthesis of L4 on L5. Vertebrae: No acute fracture or suspicious osseous lesion. Paraspinal and other soft tissues: Please see same-day CT abdomen pelvis. Disc levels: L2-L3: Mild disc bulge. Moderate facet arthropathy, right-greater-than-left. No spinal canal stenosis or neural foraminal narrowing. L3-L4: Mild disc bulge. Moderate right-greater-than-left facet  arthropathy. Mild spinal canal stenosis and mild bilateral neural foraminal narrowing. L4-L5: Grade 1 anterolisthesis with disc unroofing and moderate disc bulge. Severe right-greater-than-left facet arthropathy. Moderate spinal canal stenosis. Mild bilateral neural foraminal narrowing. L5-S1: Mild disc bulge. Moderate facet arthropathy. No spinal canal stenosis. Moderate left neural foraminal narrowing. IMPRESSION: 1. No acute fracture or suspicious osseous lesion. 2. Degenerative changes in the lumbar spine with moderate spinal canal stenosis at L4-L5 and mild spinal canal stenosis at L3-L4. Electronically Signed   By: Merilyn Baba M.D.   On: 11/17/2021 15:53        Scheduled Meds:  Chlorhexidine Gluconate Cloth  6 each Topical Daily   Continuous Infusions:  sodium chloride 50 mL/hr at 11/18/21 1022     LOS: 1 day    Time spent: 35 minutes    Barb Merino, MD [facility] Pager [phone]

## 2021-11-18 NOTE — TOC Initial Note (Addendum)
Transition of Care [facility]) - Initial/Assessment Note    Patient Details  Name: Kirsten [name] MRN: [id number] Date of Birth: 23-Apr-1960  Transition of Care [facility]) CM/SW Contact:    Leeroy Cha, RN Phone Number: 11/18/2021, 8:14 AM  Clinical Narrative:                 [name] with [employer].  Line busy x3 will try again after 0900. [name] member and daughter's information given to her.  She will call the daughter and speak to her about possible home hospice. Will speak to the daughter about not having food available to mice and other pest.  Suggest that they get a pest control company to come out and possibly spray house and set traps. Expected Discharge Plan: Home w Hospice Care Barriers to Discharge: Continued Medical Work up   Patient Goals and CMS Choice Patient states their goals for this hospitalization and ongoing recovery are:: to go home   Choice offered to / list presented to : Adult Children  Expected Discharge Plan and Services Expected Discharge Plan: Home w Hospice Care   Discharge Planning Services: CM Consult Post Acute Care Choice: Hospice Living arrangements for the past 2 months: Single Family Home                                      Prior Living Arrangements/Services Living arrangements for the past 2 months: Single Family Home Lives with:: Self Patient language and need for interpreter reviewed:: Yes Do you feel safe going back to the place where you live?: Yes            Criminal Activity/Legal Involvement Pertinent to Current Situation/Hospitalization: No - Comment as needed  Activities of Daily Living Home Assistive Devices/Equipment: Bedside commode/3-in-1, Wheelchair, Environmental consultant (specify type), Shower chair with back ADL Screening (condition at time of admission) Patient's cognitive ability adequate to safely complete daily activities?: No (new problem) Is the patient deaf or have difficulty hearing?: No Does the  patient have difficulty seeing, even when wearing glasses/contacts?: No Does the patient have difficulty concentrating, remembering, or making decisions?: Yes (new problem) Patient able to express need for assistance with ADLs?: Yes Does the patient have difficulty dressing or bathing?: Yes Independently performs ADLs?: No Communication: Independent Dressing (OT): Needs assistance Is this a change from baseline?: Pre-admission baseline Grooming: Needs assistance Is this a change from baseline?: Pre-admission baseline Feeding: Needs assistance Is this a change from baseline?: Pre-admission baseline Bathing: Needs assistance Is this a change from baseline?: Pre-admission baseline Toileting: Needs assistance Is this a change from baseline?: Pre-admission baseline In/Out Bed: Needs assistance Is this a change from baseline?: Pre-admission baseline Walks in Home: Dependent (legs are giving out on her) Is this a change from baseline?: Pre-admission baseline Does the patient have difficulty walking or climbing stairs?: Yes Weakness of Legs: Both Weakness of Arms/Hands: Both  Permission Sought/Granted                  Emotional Assessment Appearance:: Appears stated age Attitude/Demeanor/Rapport: Engaged Affect (typically observed): Calm Orientation: : Oriented to Self, Oriented to Place, Oriented to  Time, Oriented to Situation Alcohol / Substance Use: Never Used Psych Involvement: No (comment)  Admission diagnosis:  Hyperbilirubinemia [E80.6] SIRS (systemic inflammatory response syndrome) (HCC) [R65.10] Intractable pain [R52] Metastatic malignant neoplasm, unspecified site [facility]) [C79.9] Patient Active Problem List   Diagnosis Date Noted  Intractable pain 11/17/2021   Ascites 11/17/2021   Elevated LFTs 11/17/2021   Hypercalcemia 11/17/2021   Hyponatremia 11/17/2021   SIRS (systemic inflammatory response syndrome) ([place]) 11/17/2021   Normocytic anemia 11/17/2021   Genetic  testing 08/21/2020   Family history of pancreatic cancer    Family history of uterine cancer    Family history of stomach cancer    Cancer of pancreas, tail ([place]) 06/27/2020   Goals of care, counseling/discussion 06/27/2020   PCP:  Pcp, No Pharmacy:   [facility] ([place]), [place] - [address] [place] ([place]) [place] [address] Phone: [phone] Fax: [phone]     Social Determinants of Health (SDOH) Interventions    Readmission Risk Interventions   No data to display

## 2021-11-19 ENCOUNTER — Inpatient Hospital Stay: Payer: Commercial Managed Care - HMO

## 2021-11-19 DIAGNOSIS — R52 Pain, unspecified: Secondary | ICD-10-CM | POA: Diagnosis not present

## 2021-11-19 MED ORDER — MAGIC MOUTHWASH: 10.0000 mL | Freq: Four times a day (QID) | ORAL | Status: DC | PRN

## 2021-11-19 MED ORDER — PHENOL 1.4 % MT LIQD
1.0000 | OROMUCOSAL | Status: DC | PRN
  Filled 2021-11-19: qty 177

## 2021-11-19 NOTE — Assessment & Plan Note (Addendum)
Patient has a large pancreatic mass metastatic to liver with liver obstruction and severe cancer related pain.    She met with Oncology and the decision was made to focus on pain relief, comfort, and Hospice at home rather than endure futile chemotherapy. - Consult Hospice   - PO morphine as needed for pain

## 2021-11-19 NOTE — Hospital Course (Signed)
Kirsten [name] is a 61 y.o. F with metastatic pancreatic cancer who presented with worsening pain.     9/18: Admitted for pain management 9/19: Met with Oncology, elected to transition to comfort care 9/20: Awaiting hospice arrangements

## 2021-11-19 NOTE — Progress Notes (Signed)
IP PROGRESS NOTE  Subjective:   [name] appears unchanged.  Her daughter is at the bedside.  Her daughter reports she has received adequate pain relief with Roxanol.  She has difficulty swallowing and pain with swallowing.  Objective: Vital signs in last 24 hours: Blood pressure 129/84, pulse (!) 103, temperature 98.1 F (36.7 C), temperature source Oral, resp. rate 17, SpO2 95 %.  Intake/Output from previous day: 09/19 0701 - 09/20 0700 In: 1732 [P.O.:355; I.V.:1377] Out: -   Physical Exam:  HEENT: Scleral icterus, no thrush Lungs: Clear anteriorly, no respiratory distress Cardiac: Regular rate and rhythm Abdomen: Distended, tender in the right upper abdomen Extremities: No leg edema Neurologic: Alert, follows some commands, non verbal  Portacath/PICC-without erythema  Lab Results: Recent Labs    11/17/21 1350 11/18/21 0500  WBC 21.1* 19.2*  HGB 11.2* 10.3*  HCT 32.8* 30.8*  PLT 251 221    BMET Recent Labs    11/17/21 1350 11/18/21 0500  NA 131* 133*  K 3.7 3.9  CL 97* 100  CO2 25 24  GLUCOSE 84 77  BUN 14 13  CREATININE 0.57 0.64  CALCIUM 11.6* 11.2*    Lab Results  Component Value Date   CAN199 44,866 (H) 10/17/2021    Studies/Results: CT ABDOMEN PELVIS W CONTRAST  Result Date: 11/17/2021 CLINICAL DATA:  Epigastric and right flank/back pain. History of pancreatic malignancy. Active chemotherapy. EXAM: CT ABDOMEN AND PELVIS WITH CONTRAST TECHNIQUE: Multidetector CT imaging of the abdomen and pelvis was performed using the standard protocol following bolus administration of intravenous contrast. RADIATION DOSE REDUCTION: This exam was performed according to the departmental dose-optimization program which includes automated exposure control, adjustment of the mA and/or kV according to patient size and/or use of iterative reconstruction technique. CONTRAST:  166m OMNIPAQUE IOHEXOL 300 MG/ML  SOLN COMPARISON:  CT 12 days ago 11/05/2021 FINDINGS: Lower  chest: 1.6 x 1.1 cm left lower lobe nodule, series 4, image 13, unchanged from recent prior. Subpleural 8 x 5 mm left lower lobe nodule, series 4, image 17, slightly increased. There is small nodules in the right lower lobe including 7 mm peri diaphragmatic nodule series 4, image 21, new. 6 mm subpleural right middle lobe nodule series 4, image 9 a previously 4 mm. There are few additional a small nodules in the included lung bases that were not definitively seen on prior. Bandlike atelectasis in the left lower lobe. No pleural effusion. Hepatobiliary: Extensive hepatic metastatic disease again seen. Lesions measured with same caliper placement from prior. Index lesion in the periphery of the right lobe measures 7.2 x 4.7 cm, series 2, image 16 previously 6.5 x 4.3 cm. Index lesion within right lobe segment 5/6 measures 10.1 x 7.9 cm, series 2, image 27, previously 9 x 7.7 cm. Lesion within for AA measures 4.6 x 4 cm, series 2, image 20, previously 3.7 x 2.9 cm. Many of the additional liver lesions have increased in size, with the central liver lesions on prior now coalesced with adjacent liver lesions, likely accounting for biliary dilatation. These lesions are difficult to accurately measure on the current exam. There is intrahepatic biliary ductal dilatation primarily in the right lobe, that was also seen on prior exam. The distal common bile duct is nondilated, for example series 5, image 58. More proximal common bile duct is poorly defined. Gallbladder is difficult to delineate. Pancreas: Mass in the pancreatic tail is again seen. This measures approximately 5.1 x 2.5 cm, series 2, image 30, previously 5.2  x 3 cm. Size differences likely due to differences in caliper placement and scan plane. This lesion infiltrates the medial aspect of the spleen with loss of fat plane. There is no ductal dilatation. No definite peripancreatic inflammatory change. Spleen: Heterogeneous within the inferior aspect, but no  definitive intrasplenic lesions. Adrenals/Urinary Tract: No adrenal nodule. No hydronephrosis. Again seen scarring of the mid lower left kidney. No evidence of renal stone or suspicious renal lesion. Unremarkable urinary bladder. Stomach/Bowel: Lack of enteric contrast limits detailed bowel assessment. Equivocal Peri pyloric gastric wall thickening. There is no small bowel obstruction or small bowel inflammation. The appendix is not confidently visualized. Small volume of colonic stool. Vascular/Lymphatic: Atherosclerosis of the abdominal aorta. No aneurysm. 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 The intrahepatic left portal vein is patent. The extrahepatic portal vein and SMV are patent. The splenic vein is attenuated, as before. 17 mm gastrohepatic ligament node, series 2, image 28, previously 16 mm. Retrocaval lymph node measures 14 mm, series 2, image 36, unchanged. Reproductive: Heterogeneous appearance of the uterus with areas hypo and increased enhancement, similar to prior. The left ovarian lesion measures 3.7 x 3.8 cm, previously 3.8 x 3.3 cm. The right ovarian lesion measures 4.9 x 2.4 cm, previously 4.4 x 2.6 cm. Other: Increased abdominopelvic ascites, moderate in degree nodule adjacent to the descending colon measures 1.7 cm, series 2, image 66, previously 1.4 cm. Musculoskeletal: Lumbar spine assessed on dedicated lumbar spine reformats, reported separately. No definite bony destructive lesions in the pelvis. No definite subcutaneous nodules. IMPRESSION: 1. Known pancreatic malignancy. Interval progression of liver metastasis compared to recent prior. 2. Similar intrahepatic biliary ductal dilatation. The distal common bile duct is normal in caliber. 3. Pancreatic tail mass is unchanged or minimally smaller. 4. Increased abdominopelvic ascites, moderate in degree. Peritoneal nodule in the left lower quadrant has slightly increased. 5. Similar to mildly progressed  abdominal adenopathy. 6. Bilateral ovarian lesions which are minimally increased in size. 7. Progressive pulmonary metastasis in the lung bases. 8. Equivocal peri pyloric gastric wall thickening. Aortic Atherosclerosis (ICD10-I70.0). Electronically Signed   By: Keith Rake M.D.   On: 11/17/2021 16:06   CT L-SPINE NO CHARGE  Result Date: 11/17/2021 CLINICAL DATA:  Abdominal pain, history of pancreatic cancer EXAM: CT LUMBAR SPINE WITHOUT CONTRAST TECHNIQUE: Multidetector CT imaging of the lumbar spine was performed without intravenous contrast administration. Multiplanar CT image reconstructions were also generated. RADIATION DOSE REDUCTION: This exam was performed according to the departmental dose-optimization program which includes automated exposure control, adjustment of the mA and/or kV according to patient size and/or use of iterative reconstruction technique. COMPARISON:  No prior CT of the lumbar spine, correlation is made with 11/05/2021 CT abdomen pelvis FINDINGS: Segmentation: 5 lumbar-type vertebral bodies. Alignment: Unchanged grade 1 anterolisthesis of L4 on L5. Vertebrae: No acute fracture or suspicious osseous lesion. Paraspinal and other soft tissues: Please see same-day CT abdomen pelvis. Disc levels: L2-L3: Mild disc bulge. Moderate facet arthropathy, right-greater-than-left. No spinal canal stenosis or neural foraminal narrowing. L3-L4: Mild disc bulge. Moderate right-greater-than-left facet arthropathy. Mild spinal canal stenosis and mild bilateral neural foraminal narrowing. L4-L5: Grade 1 anterolisthesis with disc unroofing and moderate disc bulge. Severe right-greater-than-left facet arthropathy. Moderate spinal canal stenosis. Mild bilateral neural foraminal narrowing. L5-S1: Mild disc bulge. Moderate facet arthropathy. No spinal canal stenosis. Moderate left neural foraminal narrowing. IMPRESSION: 1. No acute fracture or suspicious osseous lesion. 2. 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 Electronically Signed   By: Merilyn Baba M.D.   On: 11/17/2021 15:53    Medications: I have reviewed the patient's current medications.  Assessment/Plan: Metastatic pancreas cancer Right upper quadrant ultrasound 06/17/2020-multiple hypoechoic liver lesions CT abdomen/pelvis 06/17/2020- pancreas tail mass, multiple liver metastases, enlarged periaortic nodes, right ovary metastasis?,  Nodularity at the left paracolic gutter and posterior/inferior stomach suggesting peritoneal tumor spread CT chest 06/17/2020- scattered small pulmonary nodules consistent with metastatic disease Ultrasound-guided biopsy of a left liver mass 06/18/2020- adenocarcinoma, morphology compatible with metastatic pancreatic adenocarcinoma, MSS, tumor mutation burden 0 Cycle 1 FOLFOX 07/01/2020 Cycle 2 FOLFOX 07/16/2020 Cycle 3 FOLFOX 07/31/2020 Cycle 4 FOLFIRINOX 08/13/2020 Cycle 5 FOLFIRINOX 09/03/2020, Udenyca CTs 09/13/2020-unchanged nodule medial left lower lobe; stable nodal and likely peritoneal disease in the abdomen/pelvis; unchanged pancreatic tail mass; multifocal hepatic metastases, mildly progressive Cycle 6 FOLFIRINOX 09/16/2020, Oxaliplatin held Cycle 7 FOLFIRINOX 09/30/2020 Cycle 8 FOLFIRINOX 10/14/2020, oxaliplatin held Cycle 9 FOLFIRINOX 10/28/2020, oxaliplatin held Cycle 10 FOLFIRINOX 11/11/2020, oxaliplatin held Cycle 11 FOLFIRINOX 11/25/2020, oxaliplatin held CTs 12/06/2020-primary pancreatic tail tumor decreased in size.  Widespread liver metastases all decreased.  Previously visualized peritoneal nodule adjacent to the distal stomach resolved.  Left lower lobe pulmonary nodule stable.  No new or progressive metastatic disease. Cycle 12 FOLFIRI 12/09/2020 Cycle 13 FOLFIRI 12/23/2020 Cycle 14 FOLFIRI 01/06/2021 Cycle 15 FOLFIRI 01/20/2021 Cycle 16 FOLFIRI 02/03/2021 Cycle 17 FOLFIRI 02/17/2021 CTs 02/27/2021-slight  decrease in size of pancreas tail mass and hypodense liver lesions, unchanged left lower lobe nodule, enlargement of a single peritoneal nodule anterior to the distal descending colon Cycle 18 FOLFIRI 03/04/2021 Cycle 19 FOLFIRI 03/17/2021 Cycle 20 FOLFIRI 03/31/2021 Cycle 21 FOLFIRI 04/14/2021 CTs 04/24/2021-unchanged nodule left lower lobe.  Numerous hypodense liver lesions, a few of which are clearly increased in size.  Unchanged mass pancreatic tail. Cycle 1 gemcitabine/Abraxane 05/05/2021 Cycle 2 gemcitabine/Abraxane 05/19/2021 Cycle 3 gemcitabine/Abraxane 06/02/2021 Cycle 4 gemcitabine/Abraxane 06/16/2021 Cycle 5 gemcitabine/Abraxane 06/30/2021 CTs 07/11/2021-enlargement of pancreas tail mass and liver metastases, stable left lower lobe nodule, stable left lower quadrant omental nodule, stable bilateral ovarian lesions-not definitely simple cysts Cycle 1 FOLFIRINOX 07/21/2021 Cycle 2 FOLFIRINOX 08/05/2021 Cycle 3 FOLFIRINOX 08/21/2021, oxaliplatin dose reduced due to neuropathy Cycle 4 FOLFIRINOX 09/01/2021 Cycle 5 FOLFIRINOX 09/15/2021 Cycle 6 FOLFIRINOX 09/29/2021 CTs 10/17/2021-numerous new and enlarged hypoenhancing lesions throughout the liver; new associated segmental intra hepatic biliary ductal dilatation of the right lobe; gallbladder again appears to be directly involved by adjacent metastatic lesions and is difficult to clearly distinguish; interval enlargement of gastrohepatic ligament and portacaval lymph nodes; slightly diminished size and fullness of the mass of the pancreatic tail; stable or minimally enlarged nodule anterior to the distal descending colon; unchanged bilateral ovarian lesions; unchanged left lower lobe pulmonary nodule.   2.  Pain secondary to #1-improved 3.  Weight loss-improved 4.  Family history of pancreas cancer 5.  Neutropenia secondary chemotherapy-G-CSF added with cycle 5 FOLFIRINOX 6.  Oxaliplatin neuropathy-oxaliplatin held 09/16/2020, 10/14/2020, 10/28/2020, 11/11/2020,  11/25/2020 7.  Admission 11/17/2021 with intractable pain 8.  Hypercalcemia secondary to metastatic pancreas cancer-zometa 11/18/21   Ms. Nole appears unchanged.  She has advanced metastatic pancreas cancer.   Her performance status is poor. She has pain due to the pancreas mass and liver metastases. The plan is to enroll in home hospice care.  I discussed disposition plans with her daughter this AM.  She plans to take her at home with hospice care.  Recommendations:  Continue roxanol for pain Home with hospice when hospital bed in place I added Magic mouth wash and chloraseptic for the odynophagia I will follow her with the home hospice team     LOS: 2 days   [name], MD   11/19/2021, 7:31 AM

## 2021-11-19 NOTE — Progress Notes (Signed)
  Progress Note   Patient: Kirsten [name] ZWC:[id number] DOB: Sep 17, 1960 DOA: 11/17/2021     2 DOS: the patient was seen and examined on 11/19/2021 at 11:27AM      Brief hospital course: Mrs. Kirsten [name] is a 61 y.o. F with metastatic pancreatic cancer who presented with worsening pain.     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    She met with Oncology and the decision was made to focus on pain relief, comfort, and Hospice at home rather than endure futile chemotherapy. - Consult Hospice   - PO morphine as needed for pain          Subjective: No nursing concerns.  No fever, no vomiting, no respiratory distress.  Patient is too sleepy to answer me.     Physical Exam: BP (!) 141/78 (BP Location: Right Arm)   Pulse (!) 114   Temp 98.6 F (37 C) (Axillary)   Resp 20   SpO2 94%   Thin elderly female, lying in bed, sleeping, slow to arouse Jaundiced Tachycardic, regular, no murmurs, no peripheral edema Respiratory rate shallow, somewhat elevated, no rales, no wheezing Abdomen soft, no tenderness to light palpation, no distention Attention diminished, affect blunted, very somnolent, slow to arouse, makes no responses to my questions, does not follow commands    Data Reviewed: Recent complete metabolic panel shows total bilirubin elevated Also hyponatremia        Disposition: Status is: Inpatient         Author: Edwin Dada, MD 11/19/2021 2:35 PM  For on call review [email or web address].

## 2021-11-20 ENCOUNTER — Other Ambulatory Visit: Payer: Self-pay

## 2021-11-20 ENCOUNTER — Ambulatory Visit: Payer: Commercial Managed Care - HMO | Admitting: Oncology

## 2021-11-20 ENCOUNTER — Other Ambulatory Visit: Payer: Self-pay | Admitting: Family Medicine

## 2021-11-20 ENCOUNTER — Ambulatory Visit: Payer: Self-pay

## 2021-11-20 ENCOUNTER — Encounter: Payer: Self-pay | Admitting: Oncology

## 2021-11-20 ENCOUNTER — Other Ambulatory Visit (HOSPITAL_COMMUNITY): Payer: Self-pay

## 2021-11-20 DIAGNOSIS — Z515 Encounter for palliative care: Secondary | ICD-10-CM

## 2021-11-20 DIAGNOSIS — R52 Pain, unspecified: Secondary | ICD-10-CM | POA: Diagnosis not present

## 2021-11-20 MED ORDER — HEPARIN SOD (PORK) LOCK FLUSH 100 UNIT/ML IV SOLN: 500.0000 [IU] | Freq: Once | INTRAVENOUS | Status: AC

## 2021-11-20 MED ORDER — HEPARIN SOD (PORK) LOCK FLUSH 100 UNIT/ML IV SOLN
INTRAVENOUS | Status: AC
  Administered 2021-11-20: 500 [IU] via INTRAVENOUS
  Filled 2021-11-20: qty 5

## 2021-11-20 MED ORDER — MORPHINE SULFATE (CONCENTRATE) 10 MG/0.5ML PO SOLN
10.0000 mg | ORAL | 0 refills | Status: AC | PRN
  Filled 2021-11-20: qty 180, 23d supply, fill #0

## 2021-11-20 MED ORDER — DIAZEPAM 5 MG/ML PO CONC
5.0000 mg | Freq: Three times a day (TID) | ORAL | 0 refills | Status: DC | PRN
  Filled 2021-11-20: qty 30, 10d supply, fill #0

## 2021-11-20 MED ORDER — LORAZEPAM 1 MG PO TABS: 1.0000 mg | ORAL_TABLET | Freq: Three times a day (TID) | ORAL | 0 refills | Status: AC | PRN

## 2021-11-20 MED ORDER — LORAZEPAM 0.5 MG PO TABS
0.5000 mg | ORAL_TABLET | Freq: Three times a day (TID) | ORAL | 0 refills | Status: DC | PRN
  Filled 2021-11-20: qty 30, 10d supply, fill #0

## 2021-11-20 NOTE — Plan of Care (Signed)
  Problem: Education: Goal: Knowledge of General Education information will improve Description: Including pain rating scale, medication(s)/side effects and non-pharmacologic comfort measures Outcome: Not Progressing   Problem: Health Behavior/Discharge Planning: Goal: Ability to manage health-related needs will improve Outcome: Not Progressing   Problem: Clinical Measurements: Goal: Ability to maintain clinical measurements within normal limits will improve Outcome: Progressing Goal: Will remain free from infection Outcome: Progressing Goal: Diagnostic test results will improve Outcome: Progressing Goal: Respiratory complications will improve Outcome: Progressing Goal: Cardiovascular complication will be avoided Outcome: Progressing   Problem: Activity: Goal: Risk for activity intolerance will decrease Outcome: Not Progressing   Problem: Nutrition: Goal: Adequate nutrition will be maintained Outcome: Not Progressing   Problem: Coping: Goal: Level of anxiety will decrease Outcome: Progressing   Problem: Elimination: Goal: Will not experience complications related to bowel motility Outcome: Progressing Goal: Will not experience complications related to urinary retention Outcome: Progressing   Problem: Pain Managment: Goal: General experience of comfort will improve Outcome: Progressing   Problem: Safety: Goal: Ability to remain free from injury will improve Outcome: Progressing   Problem: Skin Integrity: Goal: Risk for impaired skin integrity will decrease Outcome: Progressing

## 2021-11-20 NOTE — Discharge Summary (Signed)
Physician Discharge Summary   Patient: Kirsten [name] MRN: [id number] DOB: 1960-06-04  Admit date:     11/17/2021  Discharge date: 11/20/21  Discharge Physician: Edwin Dada   PCP: Pcp, No     Recommendations at discharge:  Discharged to home with [facility] following     Discharge Diagnoses: Principal Problem:   Intractable pain due to metastatic pancreatic cancer Active Problems:   Cancer of pancreas, tail ([place])   Ascites   Jaundice   Hypercalcemia   Hyponatremia   Normocytic anemia   Hospice care patient       Hospital Course: Kirsten [name] is a 61 y.o. F with metastatic pancreatic cancer who presented with worsening pain.    Patient has a large pancreatic mass metastatic to liver with liver obstruction and severe cancer related pain.    She met with Oncology and the decision was made to focus on pain relief, comfort, and Hospice at home rather than endure further chemotherapy with little chance to improve quality or quantity of life.  Hospice arrangements were made.                  Consultants: Oncology   Disposition: Home with Hospice   DISCHARGE MEDICATION: Allergies as of 11/20/2021   No Known Allergies      Medication List     STOP taking these medications    BC HEADACHE POWDER PO   bisacodyl 5 MG EC tablet Commonly known as: DULCOLAX   famotidine-calcium carbonate-magnesium hydroxide 10-800-165 MG chewable tablet Commonly known as: PEPCID COMPLETE   HYDROcodone-acetaminophen 10-325 MG tablet Commonly known as: NORCO   ibuprofen 200 MG tablet Commonly known as: ADVIL   lidocaine-prilocaine cream Commonly known as: EMLA   ondansetron 8 MG tablet Commonly known as: ZOFRAN   oxyCODONE 5 MG immediate release tablet Commonly known as: Oxy IR/ROXICODONE   potassium chloride 10 MEQ CR capsule Commonly known as: MICRO-K   prochlorperazine 10 MG tablet Commonly known as: COMPAZINE       TAKE these  medications    morphine CONCENTRATE 10 mg / 0.5 ml concentrated solution Place 0.5-1 mLs (10-20 mg total) under the tongue every 3 (three) hours as needed for moderate pain or shortness of breath.           Discharge Exam: There were no vitals filed for this visit.  General: Pt is somnolent, jaundiced, poorly arousable Cardiovascular: Tachy, regular, nl S1-S2, no murmurs appreciated.   No LE edema.   Respiratory: Normal respiratory rate and rhythm.  CTAB without rales or wheezes. Abdominal: Abdomen soft and non-tender or obvious grimace to palpation.  No distension or HSM.   Neuro/Psych: Mostly does not respond to questions or follow commands, no spontaneous verbalizations.   Condition at discharge: worsening  The results of significant diagnostics from this hospitalization (including imaging, microbiology, ancillary and laboratory) are listed below for reference.   Imaging Studies: CT ABDOMEN PELVIS W CONTRAST  Result Date: 11/17/2021 CLINICAL DATA:  Epigastric and right flank/back pain. History of pancreatic malignancy. Active chemotherapy. EXAM: CT ABDOMEN AND PELVIS WITH CONTRAST TECHNIQUE: Multidetector CT imaging of the abdomen and pelvis was performed using the standard protocol following bolus administration of intravenous contrast. RADIATION DOSE REDUCTION: This exam was performed according to the departmental dose-optimization program which includes automated exposure control, adjustment of the mA and/or kV according to patient size and/or use of iterative reconstruction technique. CONTRAST:  145m OMNIPAQUE IOHEXOL 300 MG/ML  SOLN COMPARISON:  CT 12 days ago  11/05/2021 FINDINGS: Lower chest: 1.6 x 1.1 cm left lower lobe nodule, series 4, image 13, unchanged from recent prior. Subpleural 8 x 5 mm left lower lobe nodule, series 4, image 17, slightly increased. There is small nodules in the right lower lobe including 7 mm peri diaphragmatic nodule series 4, image 21, new. 6 mm  subpleural right middle lobe nodule series 4, image 9 a previously 4 mm. There are few additional a small nodules in the included lung bases that were not definitively seen on prior. Bandlike atelectasis in the left lower lobe. No pleural effusion. Hepatobiliary: Extensive hepatic metastatic disease again seen. Lesions measured with same caliper placement from prior. Index lesion in the periphery of the right lobe measures 7.2 x 4.7 cm, series 2, image 16 previously 6.5 x 4.3 cm. Index lesion within right lobe segment 5/6 measures 10.1 x 7.9 cm, series 2, image 27, previously 9 x 7.7 cm. Lesion within for AA measures 4.6 x 4 cm, series 2, image 20, previously 3.7 x 2.9 cm. Many of the additional liver lesions have increased in size, with the central liver lesions on prior now coalesced with adjacent liver lesions, likely accounting for biliary dilatation. These lesions are difficult to accurately measure on the current exam. There is intrahepatic biliary ductal dilatation primarily in the right lobe, that was also seen on prior exam. The distal common bile duct is nondilated, for example series 5, image 58. More proximal common bile duct is poorly defined. Gallbladder is difficult to delineate. Pancreas: Mass in the pancreatic tail is again seen. This measures approximately 5.1 x 2.5 cm, series 2, image 30, previously 5.2 x 3 cm. Size differences likely due to differences in caliper placement and scan plane. This lesion infiltrates the medial aspect of the spleen with loss of fat plane. There is no ductal dilatation. No definite peripancreatic inflammatory change. Spleen: Heterogeneous within the inferior aspect, but no definitive intrasplenic lesions. Adrenals/Urinary Tract: No adrenal nodule. No hydronephrosis. Again seen scarring of the mid lower left kidney. No evidence of renal stone or suspicious renal lesion. Unremarkable urinary bladder. Stomach/Bowel: Lack of enteric contrast limits detailed bowel  assessment. Equivocal Peri pyloric gastric wall thickening. There is no small bowel obstruction or small bowel inflammation. The appendix is not confidently visualized. Small volume of colonic stool. Vascular/Lymphatic: Atherosclerosis of the abdominal aorta. No aneurysm. 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 The intrahepatic left portal vein is patent. The extrahepatic portal vein and SMV are patent. The splenic vein is attenuated, as before. 17 mm gastrohepatic ligament node, series 2, image 28, previously 16 mm. Retrocaval lymph node measures 14 mm, series 2, image 36, unchanged. Reproductive: Heterogeneous appearance of the uterus with areas hypo and increased enhancement, similar to prior. The left ovarian lesion measures 3.7 x 3.8 cm, previously 3.8 x 3.3 cm. The right ovarian lesion measures 4.9 x 2.4 cm, previously 4.4 x 2.6 cm. Other: Increased abdominopelvic ascites, moderate in degree nodule adjacent to the descending colon measures 1.7 cm, series 2, image 66, previously 1.4 cm. Musculoskeletal: Lumbar spine assessed on dedicated lumbar spine reformats, reported separately. No definite bony destructive lesions in the pelvis. No definite subcutaneous nodules. IMPRESSION: 1. Known pancreatic malignancy. Interval progression of liver metastasis compared to recent prior. 2. Similar intrahepatic biliary ductal dilatation. The distal common bile duct is normal in caliber. 3. Pancreatic tail mass is unchanged or minimally smaller. 4. Increased abdominopelvic ascites, moderate in degree. Peritoneal nodule in the left  lower quadrant has slightly increased. 5. Similar to mildly progressed abdominal adenopathy. 6. Bilateral ovarian lesions which are minimally increased in size. 7. Progressive pulmonary metastasis in the lung bases. 8. Equivocal peri pyloric gastric wall thickening. Aortic Atherosclerosis (ICD10-I70.0). Electronically Signed   By: Keith Rake M.D.   On:  11/17/2021 16:06   CT L-SPINE NO CHARGE  Result Date: 11/17/2021 CLINICAL DATA:  Abdominal pain, history of pancreatic cancer EXAM: CT LUMBAR SPINE WITHOUT CONTRAST TECHNIQUE: Multidetector CT imaging of the lumbar spine was performed without intravenous contrast administration. Multiplanar CT image reconstructions were also generated. RADIATION DOSE REDUCTION: This exam was performed according to the departmental dose-optimization program which includes automated exposure control, adjustment of the mA and/or kV according to patient size and/or use of iterative reconstruction technique. COMPARISON:  No prior CT of the lumbar spine, correlation is made with 11/05/2021 CT abdomen pelvis FINDINGS: Segmentation: 5 lumbar-type vertebral bodies. Alignment: Unchanged grade 1 anterolisthesis of L4 on L5. Vertebrae: No acute fracture or suspicious osseous lesion. Paraspinal and other soft tissues: Please see same-day CT abdomen pelvis. Disc levels: L2-L3: Mild disc bulge. Moderate facet arthropathy, right-greater-than-left. No spinal canal stenosis or neural foraminal narrowing. L3-L4: Mild disc bulge. Moderate right-greater-than-left facet arthropathy. Mild spinal canal stenosis and mild bilateral neural foraminal narrowing. L4-L5: Grade 1 anterolisthesis with disc unroofing and moderate disc bulge. Severe right-greater-than-left facet arthropathy. Moderate spinal canal stenosis. Mild bilateral neural foraminal narrowing. L5-S1: Mild disc bulge. Moderate facet arthropathy. No spinal canal stenosis. Moderate left neural foraminal narrowing. IMPRESSION: 1. No acute fracture or suspicious osseous lesion. 2. Degenerative changes in the lumbar spine with moderate spinal canal stenosis at L4-L5 and mild spinal canal stenosis at L3-L4. Electronically Signed   By: Merilyn Baba M.D.   On: 11/17/2021 15:53   CT Abdomen Pelvis W Contrast  Result Date: 11/05/2021 CLINICAL DATA:  History of metastatic pancreatic cancer.  Unexpected elevation in bilirubin levels. Concern for biliary obstruction. EXAM: CT ABDOMEN AND PELVIS WITH CONTRAST TECHNIQUE: Multidetector CT imaging of the abdomen and pelvis was performed using the standard protocol following bolus administration of intravenous contrast. RADIATION DOSE REDUCTION: This exam was performed according to the departmental dose-optimization program which includes automated exposure control, adjustment of the mA and/or kV according to patient size and/or use of iterative reconstruction technique.  body onc CONTRAST:  35m OMNIPAQUE IOHEXOL 300 MG/ML  SOLN COMPARISON:  10/17/2021. FINDINGS: Lower chest: No acute abnormality. Left lower lobe lung nodule measures 1.6 cm, image 5/4. Previously 1.6 cm. Right middle lobe lung nodule measures 4 mm, image 5/4. Previously 2 mm. Hepatobiliary: Extensive liver metastases are again identified. -Index lesion within segment 5/6 measures 9.0 x 7.2 cm, image 26/2. This is compared with 8.1 x 7.2 cm previously. -Index lesion within the periphery of right lobe of liver, segment 6/7 measures 6.5 x 4.3 cm, image 18/2. Formally this measured 6.3 x 3.8 cm. -Index lesion within segment 4a measures 3.7 x 2.9 cm, image 17/2. This is compared with 1.7 x 1.5 cm previously. -Index lesion within segment 3 measures 5.5 x 4.2 cm, image 20/2. This is compared with 4.3 x 3.8 cm. -Tumor within the central aspect of the right lobe of liver likely accounts for the progressive dilatation of the right hepatic lobe. This obstructs the right portal vein branch measuring 5.9 x 4.1 cm, image 22/2. Previously this measured 5.1 x 3.4 cm. -Central tumor within the lateral segment of left hepatic lobe adjacent to the falciform ligament likely accounts for  the new dilatation of the left hepatic duct dilatation. This measures 3.7 x 2.5 cm, image 28/2. Previously this measured 3.6 x 2.1 cm. The common bile duct appears nondilated, image 51/5. Pancreas: Mass within the tail of  pancreas is again seen. This measures 5.2 x 3.0 cm, image 26/2. Previously 4.1 by 2.7 cm. Tumor infiltration into the medial aspect of the spleen is again noted, image 24/2. No signs of main duct dilatation. Spleen: Normal in size.  No signs of splenic metastases. Adrenals/Urinary Tract: The adrenal glands are unremarkable. No signs of hydronephrosis identified bilaterally. Scarring in volume loss involving the anterior cortex of the interpolar left kidney is again noted. Urinary bladder is unremarkable. Stomach/Bowel: Stomach appears normal. No pathologic dilatation of the large or small bowel loops to suggest obstruction no bowel wall thickening or inflammation. Vascular/Lymphatic: Aortic atherosclerosis. Occlusion of the middle and right portal vein branches identified. Left portal vein branch appears patent. The extrahepatic portal vein, portal venous confluence and SMV remain patent. There is significant attenuation of the splenic vein as noted previously. Upper abdominal adenopathy is again noted. Lymph node within the gastrohepatic ligament measures 1.6 cm, image 24/2. Previously 1.5 cm. Retrocaval lymph node measures 1.4 cm, image 30/2. Previously 0.8 cm. Reproductive: Unchanged appearance of the uterus. Left ovarian lesion measures 3.7 x 3.0 cm, image 66/2 previously 3.4 x 2.9 cm. The right ovarian lesion measures 2.6 x 4.4 cm, image 66/2. Previously 2.7 x 3.8 cm. Other: New ascites is identified within the abdomen and pelvis. The peritoneal nodule adjacent to the descending colon measures 1.4 cm on today's study. Previously this measured 0.8 cm. Musculoskeletal: No acute or significant osseous findings. IMPRESSION: 1. Interval progression of liver metastases compared with 10/17/2021. 2. Intrahepatic biliary dilatation is identified and is mildly progressive in the right hepatic lobe and new within the left lobe. With a normal caliber common bile duct the progressive intrahepatic bile duct dilatation likely  reflects mass effect from liver metastasis. 3. Mild increase in size of mass involving the tail of pancreas. 4. Mild progression of abdominal adenopathy. 5. New ascites within the abdomen and pelvis. The peritoneal nodule within the left lower quadrant of the abdomen adjacent to the descending colon has increased in size in the interval. 6. Bilateral ovarian lesions are stable to minimally increased in size. 7. Pulmonary nodules are again noted. The dominant nodule in the left lower lobe is stable. There is an enlarging nodule identified in the right middle lobe. 8. Aortic Atherosclerosis (ICD10-I70.0). Electronically Signed   By: Kerby Moors M.D.   On: 11/05/2021 11:55    Microbiology: Results for orders placed or performed during the hospital encounter of 11/17/21  Resp Panel by RT-PCR (Flu A&B, Covid) Anterior Nasal Swab     Status: None   Collection Time: 11/17/21  3:07 PM   Specimen: Anterior Nasal Swab  Result Value Ref Range Status   SARS Coronavirus 2 by RT PCR NEGATIVE NEGATIVE Final    Comment: (NOTE) SARS-CoV-2 target nucleic acids are NOT DETECTED.  The SARS-CoV-2 RNA is generally detectable in upper respiratory specimens during the acute phase of infection. The lowest concentration of SARS-CoV-2 viral copies this assay can detect is 138 copies/mL. A negative result does not preclude SARS-Cov-2 infection and should not be used as the sole basis for treatment or other patient management decisions. A negative result may occur with  improper specimen collection/handling, submission of specimen other than nasopharyngeal swab, presence of viral mutation(s) within the areas targeted  by this assay, and inadequate number of viral copies(<138 copies/mL). A negative result must be combined with clinical observations, patient history, and epidemiological information. The expected result is Negative.  Fact Sheet for Patients:  EntrepreneurPulse.com.au  Fact Sheet for  Healthcare Providers:  IncredibleEmployment.be  This test is no t yet approved or cleared by the [place] FDA and  has been authorized for detection and/or diagnosis of SARS-CoV-2 by FDA under an Emergency Use Authorization (EUA). This EUA will remain  in effect (meaning this test can be used) for the duration of the COVID-19 declaration under Section 564(b)(1) of the Act, 21 U.S.C.section 360bbb-3(b)(1), unless the authorization is terminated  or revoked sooner.       Influenza A by PCR NEGATIVE NEGATIVE Final   Influenza B by PCR NEGATIVE NEGATIVE Final    Comment: (NOTE) The Xpert Xpress SARS-CoV-2/FLU/RSV plus assay is intended as an aid in the diagnosis of influenza from Nasopharyngeal swab specimens and should not be used as a sole basis for treatment. Nasal washings and aspirates are unacceptable for Xpert Xpress SARS-CoV-2/FLU/RSV testing.  Fact Sheet for Patients: EntrepreneurPulse.com.au  Fact Sheet for Healthcare Providers: IncredibleEmployment.be  This test is not yet approved or cleared by the [place] FDA and has been authorized for detection and/or diagnosis of SARS-CoV-2 by FDA under an Emergency Use Authorization (EUA). This EUA will remain in effect (meaning this test can be used) for the duration of the COVID-19 declaration under Section 564(b)(1) of the Act, 21 U.S.C. section 360bbb-3(b)(1), unless the authorization is terminated or revoked.  Performed at [facility], [place] [address] [place], [place] [address]     Labs: CBC: Recent Labs  Lab 11/17/21 1350 11/18/21 0500  WBC 21.1* 19.2*  NEUTROABS 17.5*  --   HGB 11.2* 10.3*  HCT 32.8* 30.8*  MCV 88.2 89.5  PLT 251 940   Basic Metabolic Panel: Recent Labs  Lab 11/17/21 1350 11/18/21 0500  NA 131* 133*  K 3.7 3.9  CL 97* 100  CO2 25 24  GLUCOSE 84 77  BUN 14 13  CREATININE 0.57 0.64  CALCIUM 11.6*  11.2*   Liver Function Tests: Recent Labs  Lab 11/17/21 1350 11/18/21 0500  AST 135* 130*  ALT 55* 56*  ALKPHOS 437* 383*  BILITOT 15.0* 14.1*  PROT 7.4 6.4*  ALBUMIN 2.4* 2.1*   CBG: No results for input(s): "GLUCAP" in the last 168 hours.  Discharge time spent: approximately 35 minutes spent on discharge counseling, evaluation of patient on day of discharge, and coordination of discharge planning with nursing, social work, pharmacy and case management  Signed: Edwin Dada, MD [facility] 11/20/2021

## 2021-11-20 NOTE — Addendum Note (Signed)
Addended by: Edwin Dada on: 11/20/2021 02:35 PM   Modules accepted: Orders

## 2021-11-20 NOTE — Progress Notes (Deleted)
Pharmacy out of oral solution diazepam.  Changed to lorazepam and confirmed that pharmacy has it.  Prescription sent.

## 2021-11-20 NOTE — TOC Progression Note (Addendum)
Transition of Care [facility]) - Progression Note    Patient Details  Name: Kirsten [name] MRN: [id number] Date of Birth: January 06, 1961  Transition of [facility]) CM/SW Contact  Leeroy Cha, RN Phone Number: 11/20/2021, 7:05 AM  Clinical Narrative:    Referral to hospice for home care through [facility] done message sent to jennifer woody. Referral accepted by [facility]. 1154/pt dcd to go home.  Daughter will be at the house to receive patient .  Ptar called at 1154. Expected Discharge Plan: Home w Hospice Care Barriers to Discharge: Continued Medical Work up  Expected Discharge Plan and Services Expected Discharge Plan: [place] In-house Referral: Hospice / Palliative Care Discharge Planning Services: CM Consult Post Acute Care Choice: Hospice Living arrangements for the past 2 months: Single Family Home                                       Social Determinants of Health (SDOH) Interventions Housing Interventions: Other (Comment)  Readmission Risk Interventions   No data to display

## 2021-11-20 NOTE — Progress Notes (Signed)
Discussed Ativan solution with Hospice, they recommend tablets crushed in water.

## 2021-11-30 DEATH — deceased

## 2021-12-01 ENCOUNTER — Inpatient Hospital Stay: Payer: Commercial Managed Care - HMO

## 2021-12-01 ENCOUNTER — Inpatient Hospital Stay: Payer: Commercial Managed Care - HMO | Admitting: Nurse Practitioner

## 2021-12-03 ENCOUNTER — Inpatient Hospital Stay: Payer: Commercial Managed Care - HMO

## 2021-12-15 ENCOUNTER — Inpatient Hospital Stay: Payer: Commercial Managed Care - HMO

## 2021-12-15 ENCOUNTER — Inpatient Hospital Stay: Payer: Commercial Managed Care - HMO | Admitting: Oncology

## 2021-12-17 ENCOUNTER — Inpatient Hospital Stay: Payer: Commercial Managed Care - HMO

## 2022-05-21 IMAGING — CT CT CHEST-ABD-PELV W/ CM
2 of 5 series · 14 of 46 positions shown, 16 images · IV contrast (APPLIED)
Comparison: 09/12/2020 CT chest, abdomen and pelvis.

CLINICAL DATA: Metastatic pancreatic cancer. Assess treatment
response.

EXAM:
CT CHEST, ABDOMEN, AND PELVIS WITH CONTRAST
TECHNIQUE: Multidetector CT imaging of the chest, abdomen and pelvis was
performed following the standard protocol during bolus
administration of intravenous contrast.
CONTRAST:  100mL OMNIPAQUE IOHEXOL 300 MG/ML  SOLN

[Series 2: cap with · axial · 0.69mm/px · z∈[+694,+1214]mm · 11 of 124 slices shown, 13 images]
[im 10/124  soft-tissue]
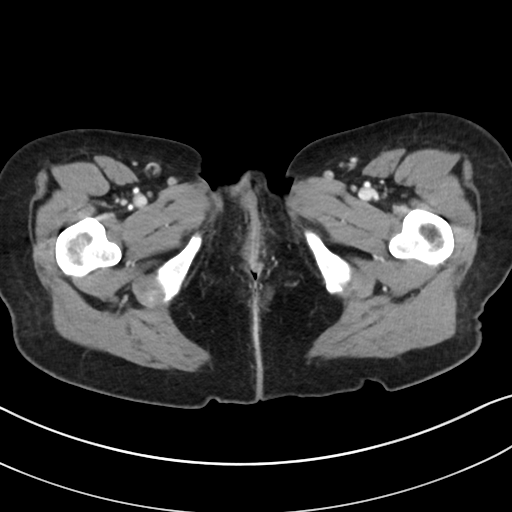
[im 10/124  bone]
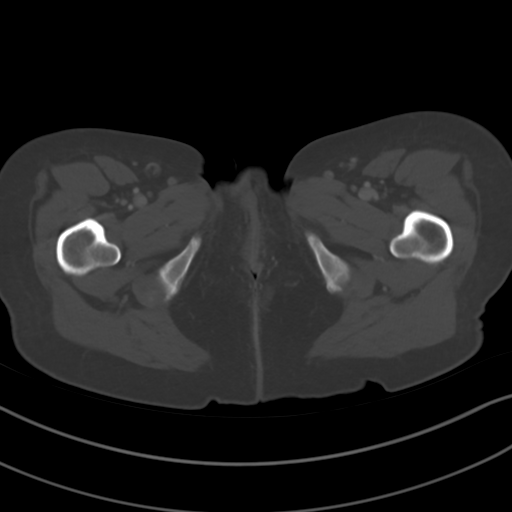
[im 19/124  soft-tissue]
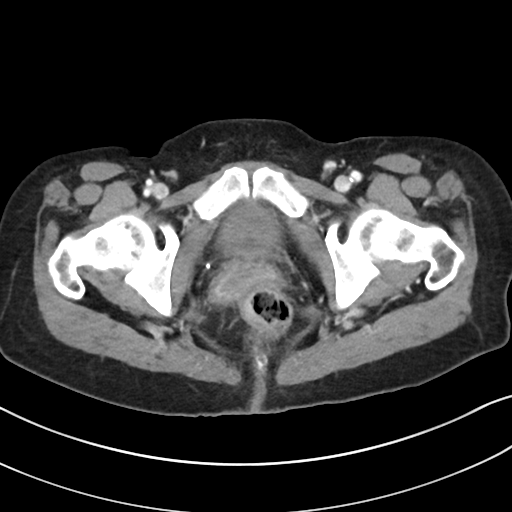
[im 29/124  soft-tissue]
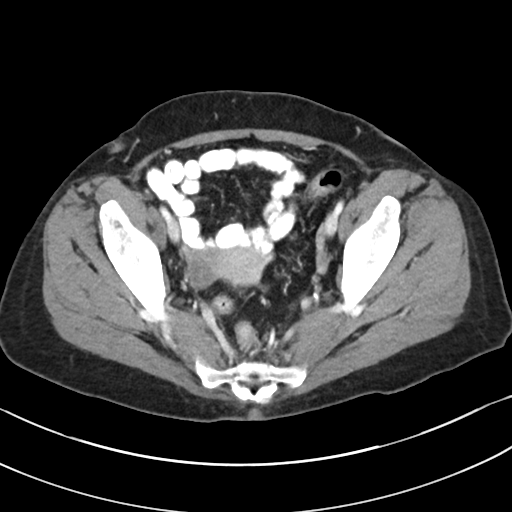
[im 38/124  soft-tissue]
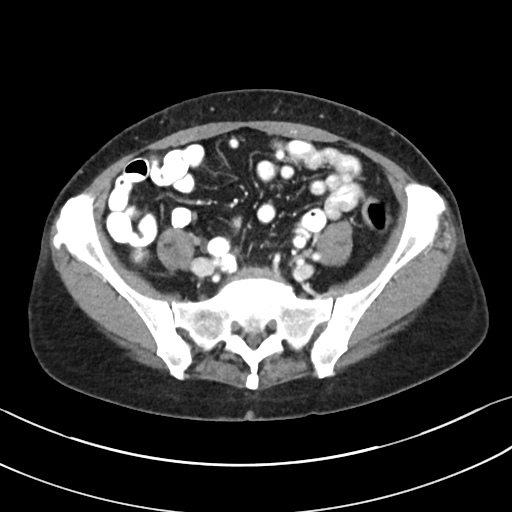
[im 48/124  soft-tissue]
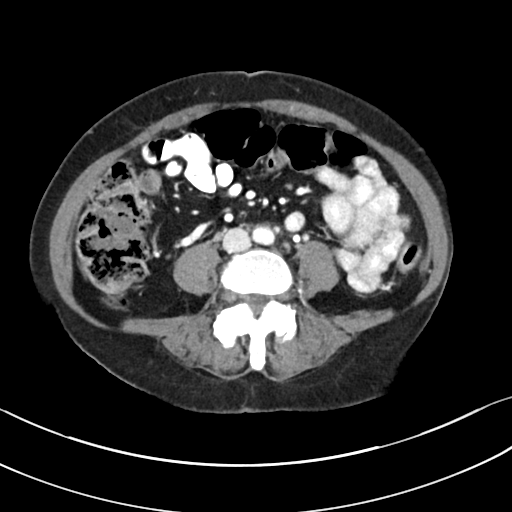
[im 67/124  soft-tissue]
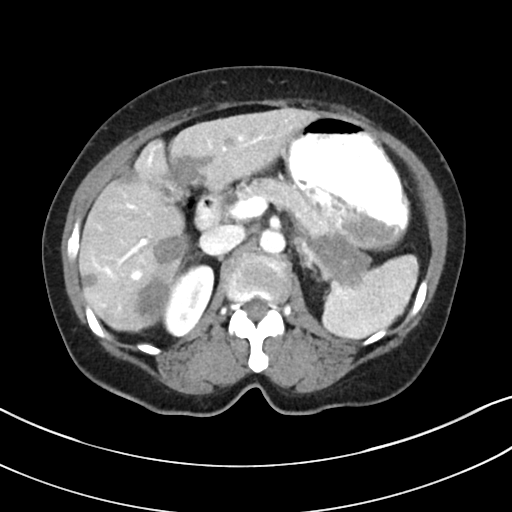
[im 76/124  soft-tissue]
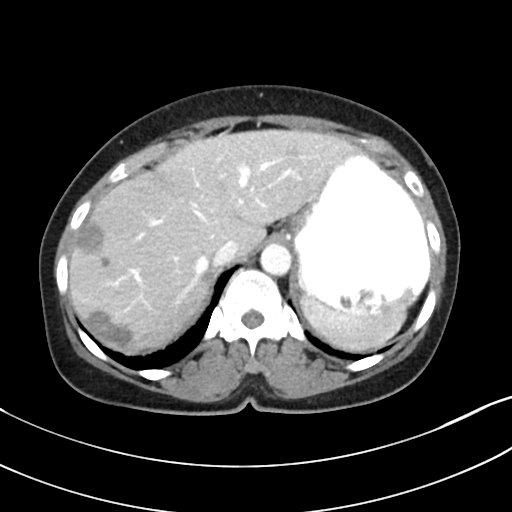
[im 86/124  soft-tissue]
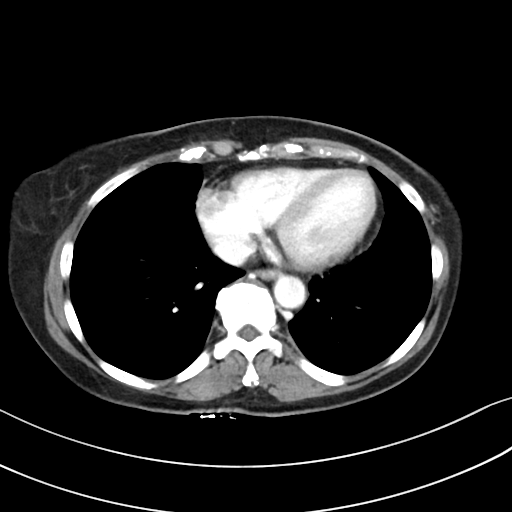
[im 95/124  soft-tissue]
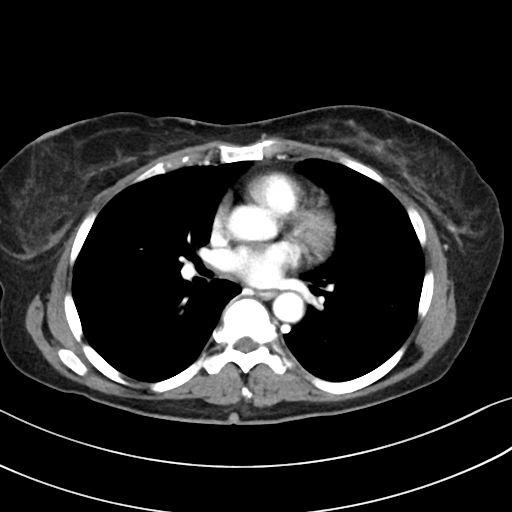
[im 95/124  bone]
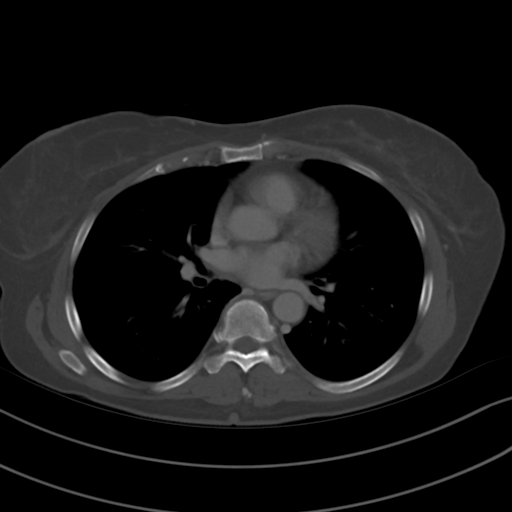
[im 105/124  soft-tissue]
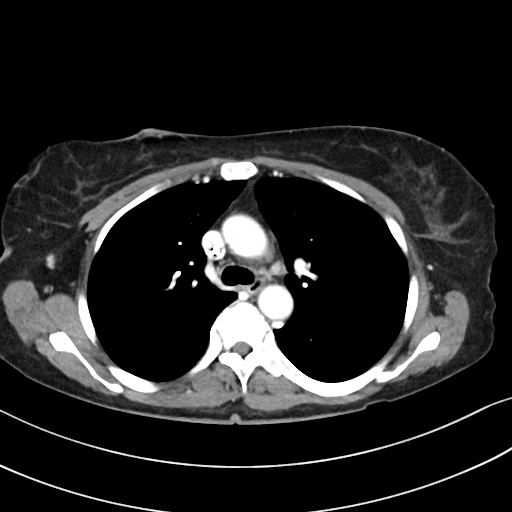
[im 114/124  soft-tissue]
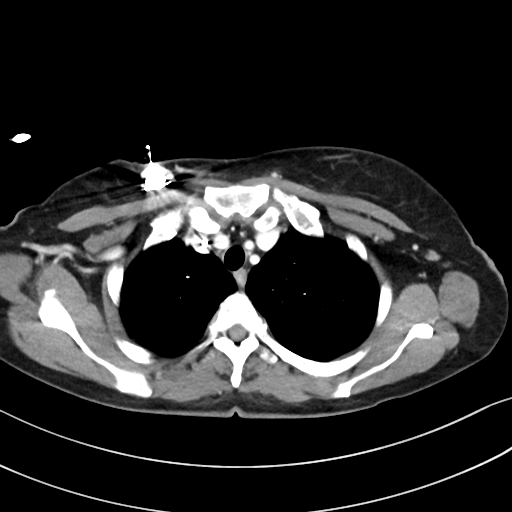

[Series 5: coronal · coronal · 0.76mm/px · 3 of 86 slices shown]
[im 29/86  soft-tissue]
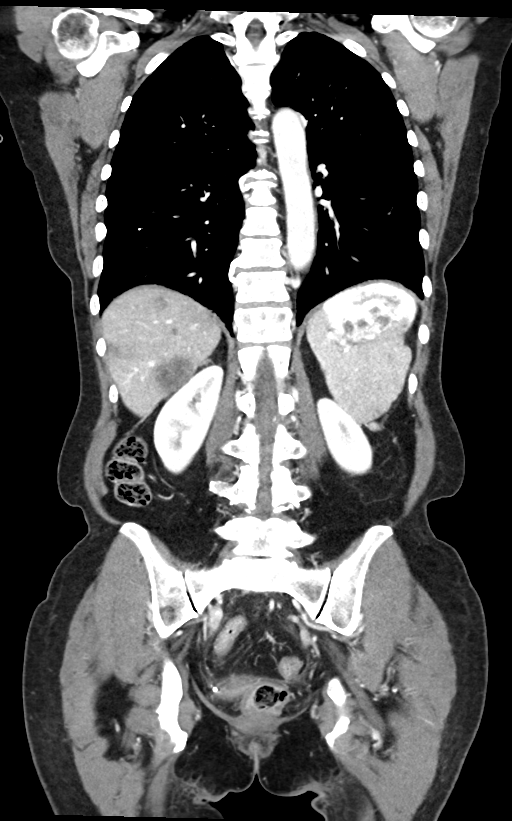
[im 38/86  soft-tissue]
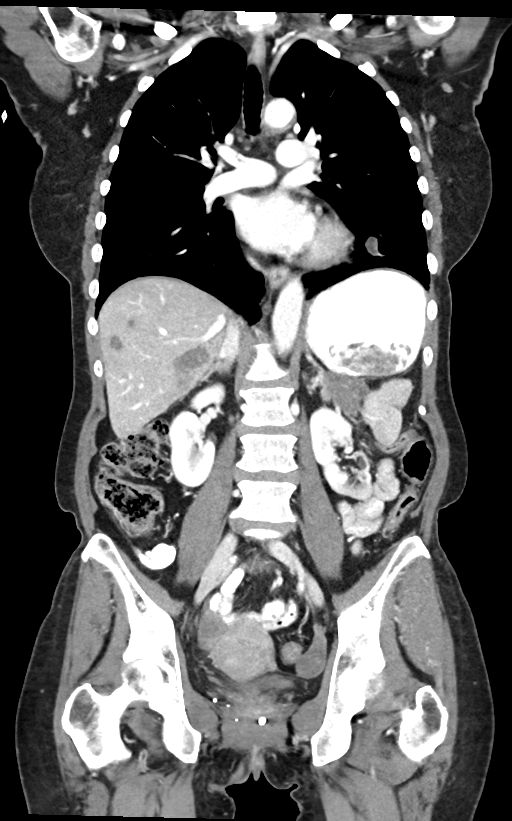
[im 48/86  soft-tissue]
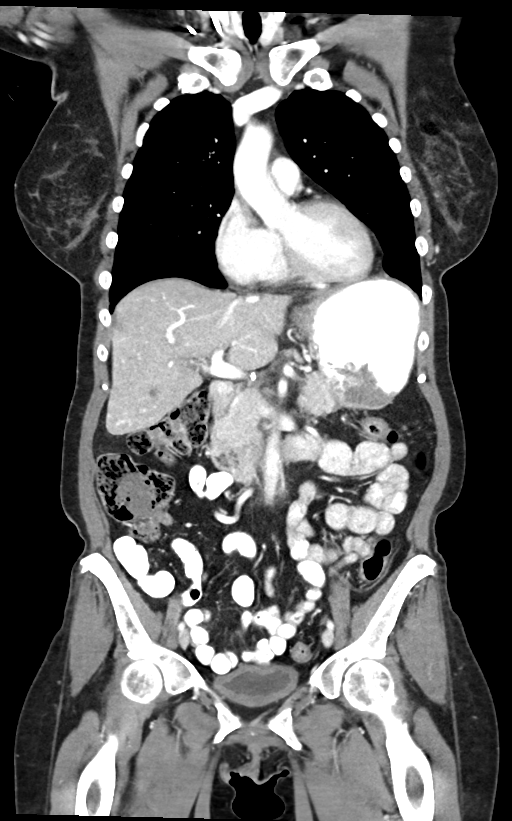

[14 of 46 positions shown; findings below may reference images not displayed]

FINDINGS: CT CHEST FINDINGS

Cardiovascular: Normal heart size. No significant pericardial
effusion/thickening. Right internal jugular Port-A-Cath terminates
in the middle third of the SVC. Atherosclerotic nonaneurysmal
thoracic aorta. Normal caliber pulmonary arteries. No central
pulmonary emboli.

Mediastinum/Nodes: No discrete thyroid nodules. Unremarkable
esophagus. No pathologically enlarged axillary, mediastinal or hilar
lymph nodes.

Lungs/Pleura: No pneumothorax. No pleural effusion. Solid 1.8 cm
left lower lobe pulmonary nodule (series 4/image 89), previously
cm using similar measurement technique, stable. No acute
consolidative airspace disease, lung masses or new significant
pulmonary nodules.

Musculoskeletal: No aggressive appearing focal osseous lesions. Mild
thoracic spondylosis.

CT ABDOMEN PELVIS FINDINGS

Hepatobiliary: Multiple (greater than 10) hypodense liver masses
scattered throughout the liver, all decreased. Representative
segment 5 right liver 3.2 x 2.7 cm mass (series 2/image 57),
previously 4.5 x 3.9 cm. Representative 2.0 x 1.7 cm segment 4B left
liver mass (series 2/image 59), previously 2.7 x 2.6 cm.
Representative peripheral right liver 3.9 x 2.6 cm mass (series
2/image 53), previously 4.4 x 3.8 cm. No new liver masses. Normal
gallbladder with no radiopaque cholelithiasis. No biliary ductal
dilatation.

Pancreas: Hypoenhancing pancreatic tail 4.7 x 3.2 cm mass (series
2/image 59), previously 5.5 x 4.1 cm using similar measurement
technique, decreased. No pancreatic duct dilation.

Spleen: Normal size. No mass.

Adrenals/Urinary Tract: No discrete adrenal nodules. Chronic
advanced mid to lower left renal cortical scarring. Mild chronic
posterior lower right renal cortical scarring. No renal masses. No
hydronephrosis. Normal bladder.

Stomach/Bowel: Normal non-distended stomach. Normal caliber small
bowel with no small bowel wall thickening. Normal appendix. Normal
large bowel with no diverticulosis, large bowel wall thickening or
pericolonic fat stranding.

Vascular/Lymphatic: Atherosclerotic nonaneurysmal abdominal aorta.
Patent portal, liver and renal veins. Chronic splenic vein
occlusion. No pathologically enlarged lymph nodes in the abdomen or
pelvis.

Reproductive: Grossly normal uterus.  No adnexal mass.

Other: No pneumoperitoneum, ascites or focal fluid collection.
Previously visualized 1.0 cm soft tissue peritoneal nodule adjacent
to the distal greater curvature of the stomach, not discretely
visualized on today's scan.

Musculoskeletal: No aggressive appearing focal osseous lesions.
Moderate lower lumbar degenerative disc disease, most prominent at
L5-S1.
IMPRESSION: 1. Interval positive response to therapy. Primary pancreatic tail
tumor is decreased in size. Widespread liver metastases are all
decreased. Previously visualized peritoneal nodule adjacent to the
distal stomach has resolved. Left lower lobe pulmonary nodule is
stable.
2. No new or progressive metastatic disease.
3. Chronic splenic vein occlusion.
4. Aortic Atherosclerosis ([id number]).

## 2022-10-08 IMAGING — CT CT CHEST-ABD-PELV W/ CM
2 of 5 series · 13 of 46 positions shown, 15 images · IV contrast (APPLIED)
Comparison: 02/27/2021

CLINICAL DATA: Metastatic pancreatic cancer, assess treatment
response, liver metastases, ongoing chemotherapy

EXAM:
CT CHEST, ABDOMEN, AND PELVIS WITH CONTRAST
TECHNIQUE: Multidetector CT imaging of the chest, abdomen and pelvis was
performed following the standard protocol during bolus
administration of intravenous contrast.

[Series 2: cap with · axial · 0.76mm/px · z∈[+792,+1302]mm · 10 of 122 slices shown, 12 images]
[im 10/122  soft-tissue]
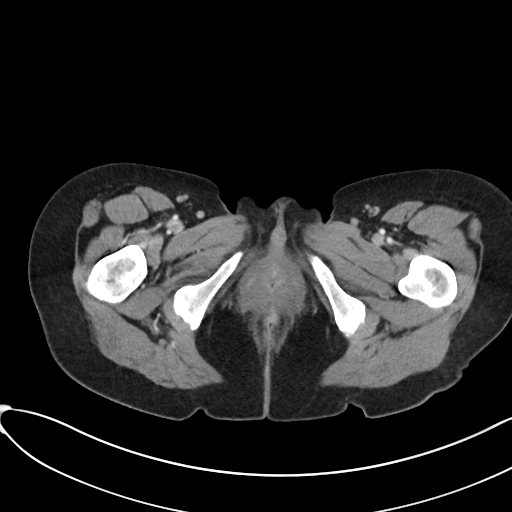
[im 10/122  bone]
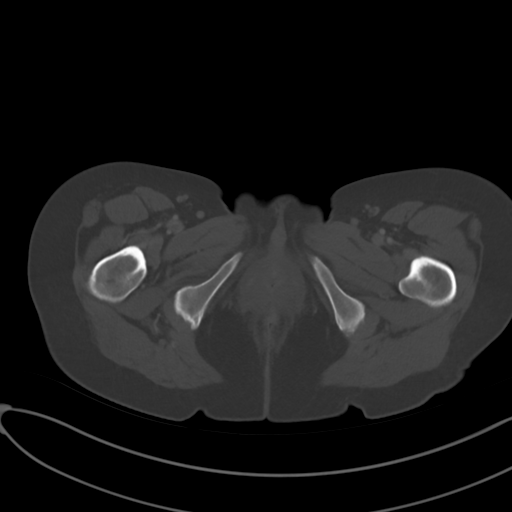
[im 19/122  soft-tissue]
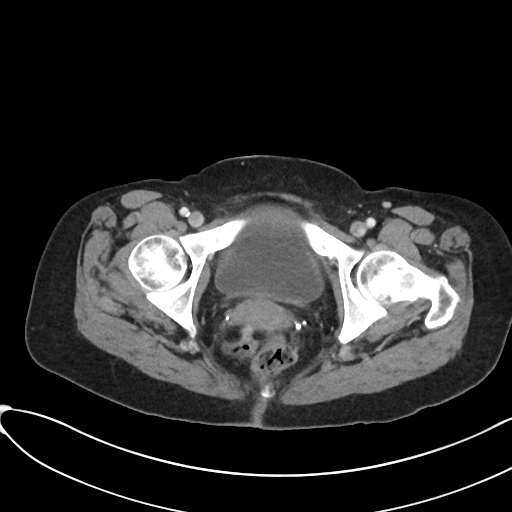
[im 38/122  soft-tissue]
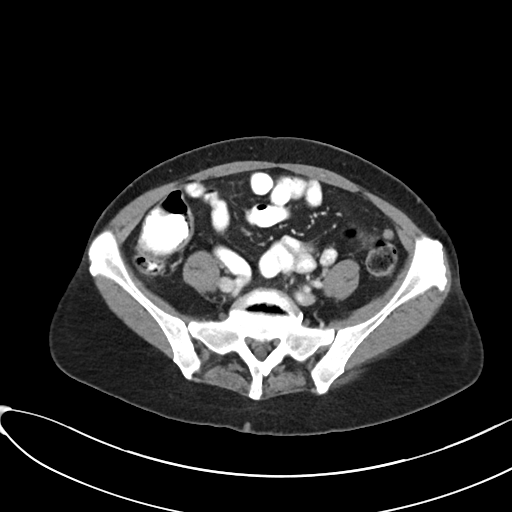
[im 47/122  soft-tissue]
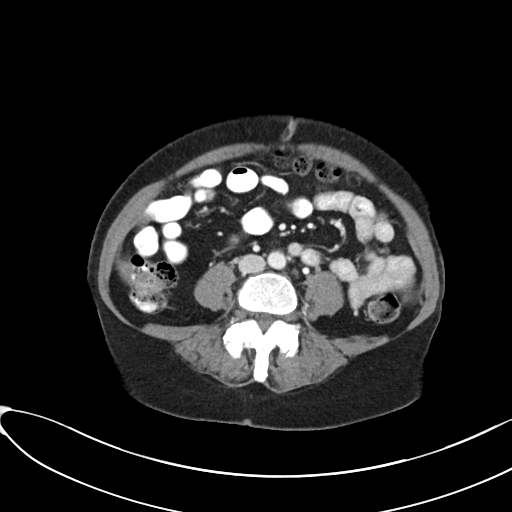
[im 56/122  soft-tissue]
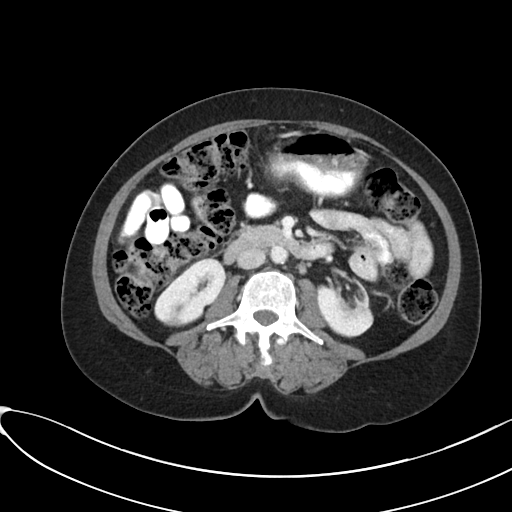
[im 66/122  soft-tissue]
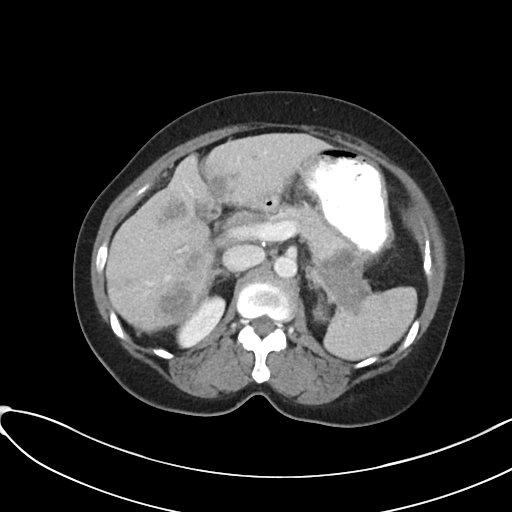
[im 75/122  soft-tissue]
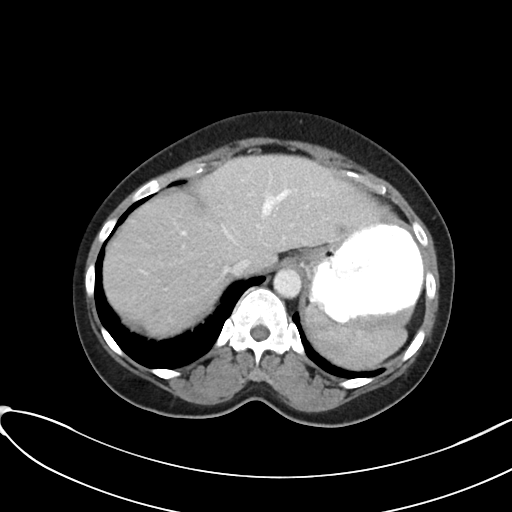
[im 94/122  soft-tissue]
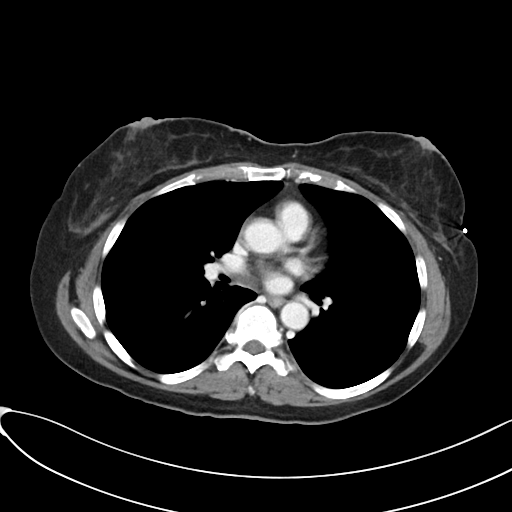
[im 103/122  soft-tissue]
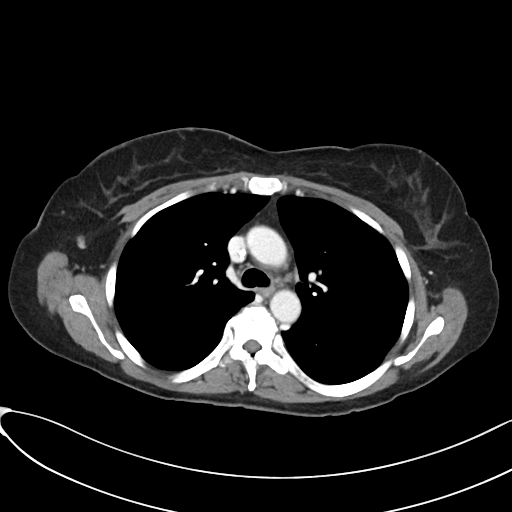
[im 103/122  bone]
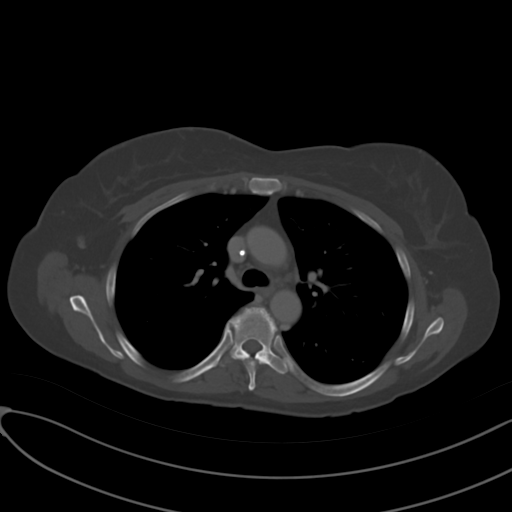
[im 112/122  soft-tissue]
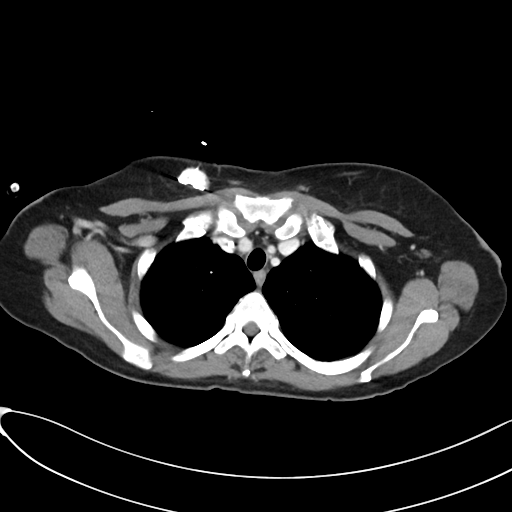

[Series 4: coronal · coronal · 0.77mm/px · 3 of 96 slices shown]
[im 32/96  soft-tissue]
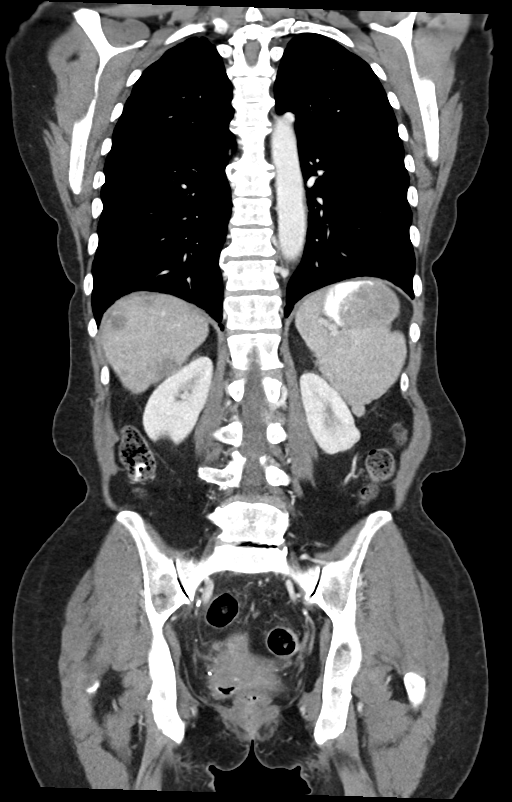
[im 43/96  soft-tissue]
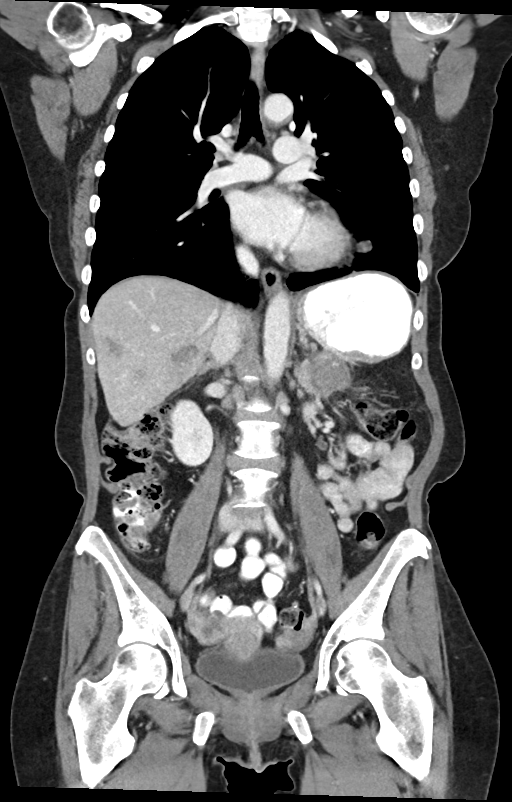
[im 53/96  soft-tissue]
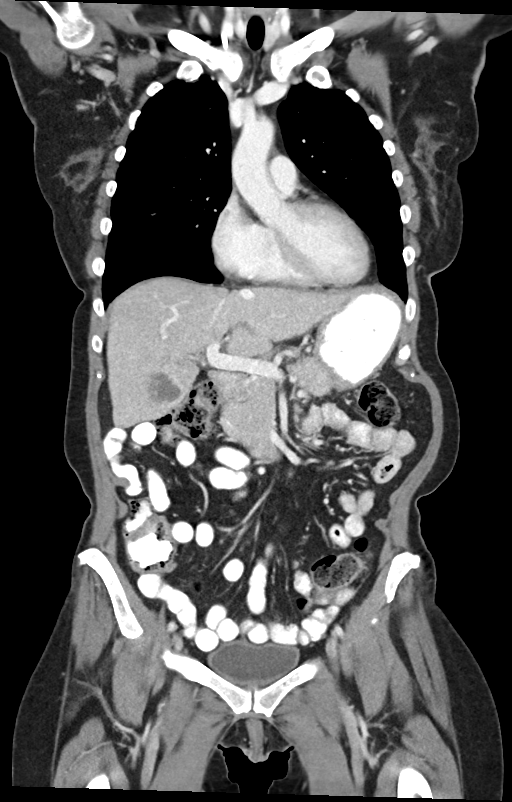

[13 of 46 positions shown; findings below may reference images not displayed]

RADIATION DOSE REDUCTION: This exam was performed according to the
departmental dose-optimization program which includes automated
exposure control, adjustment of the mA and/or kV according to
patient size and/or use of iterative reconstruction technique.

CONTRAST:  85mL OMNIPAQUE IOHEXOL 300 MG/ML SOLN, additional oral
enteric contrast
FINDINGS: CT CHEST FINDINGS

Cardiovascular: No significant vascular findings. Normal heart size.
No pericardial effusion.

Mediastinum/Nodes: No enlarged mediastinal, hilar, or axillary lymph
nodes. Thyroid gland, trachea, and esophagus demonstrate no
significant findings.

Lungs/Pleura: Unchanged nodule of the left lower lobe, measuring
x 1.4 cm (series 3, image 92). No pleural effusion or pneumothorax.

Musculoskeletal: No chest wall mass or suspicious osseous lesions
identified.

CT ABDOMEN PELVIS FINDINGS

Hepatobiliary: Numerous hypodense liver lesions, a few of which are
clearly increased in size, including a a lesion of the inferior
right lobe of the liver, hepatic segment V, measuring 3.6 x 3.2 cm,
previously 2.0 x 1.7 cm (series 2, image 59), and a lesion of the
peripheral right lobe measuring 2.0 x 1.8 cm, previously no greater
than 1.1 x 0.7 cm (series 2, image 55). Other lesions are not
significantly changed, for example an index mass of the posterior
right lobe of the liver, hepatic segment VI, measuring 2.6 x 2.2 cm
(series 2, image 57). Contracted gallbladder. No gallstones,
gallbladder wall thickening, or biliary dilatation.

Pancreas: Unchanged hypodense mass of the pancreatic tail, measuring
4.1 x 3.1 cm (series 2, image 56). No pancreatic ductal dilatation
or surrounding inflammatory changes.

Spleen: Normal in size without significant abnormality.

Adrenals/Urinary Tract: Adrenal glands are unremarkable. Unchanged
cortical scarring of the anterior and inferior left kidney (series
2, image 67). Kidneys are otherwise normal, without renal calculi,
solid lesion, or hydronephrosis. Bladder is unremarkable.

Stomach/Bowel: Stomach is within normal limits. Appendix appears
normal. No evidence of bowel wall thickening, distention, or
inflammatory changes.

Vascular/Lymphatic: Aortic atherosclerosis. Unchanged prominent
subcentimeter celiac axis and left retroperitoneal lymph nodes
(series 2, image 55, 66).

Reproductive: Uterine fibroid. Multiple small bilateral ovarian
cysts, unchanged compared to prior examinations and measuring no
greater than 1.2 cm (series 2, image 99, 97).

Other: No abdominal wall hernia or abnormality. No ascites.
Continued enlargement of a small nodule anterior to the distal
descending colon, measuring 0.9 x 0.8 cm, previously 0.6 x 0.6 cm
(series 2, image 85).

Musculoskeletal: No acute osseous findings.
IMPRESSION: 1. Unchanged pancreatic tail mass.
2. Numerous hypodense liver metastases, a few of which are clearly
increased in size, consistent with worsened hepatic metastatic
disease. Other liver lesions are not significantly changed.
3. Continued enlargement of a small metastatic peritoneal nodule
anterior to the distal descending colon.
4. Unchanged left lower lobe pulmonary nodule.
5. Unchanged prominent subcentimeter celiac axis and left
retroperitoneal lymph nodes, nonspecific although again possibly
reflecting small nodal metastases. Attention on follow-up.

Aortic Atherosclerosis ([id number]-[id number].Z).

## 2022-12-25 IMAGING — CT CT CHEST-ABD-PELV W/ CM
2 of 5 series · 12 of 46 positions shown, 14 images · IV contrast (APPLIED)
Comparison: 04/24/2021.

CLINICAL DATA: Restaging metastatic pancreatic cancer. * Tracking
Code: BO *

EXAM:
CT CHEST, ABDOMEN, AND PELVIS WITH CONTRAST
TECHNIQUE: Multidetector CT imaging of the chest, abdomen and pelvis was
performed following the standard protocol during bolus
administration of intravenous contrast.

[Series 2: cap with · axial · 0.63mm/px · z∈[+749,+1264]mm · 9 of 123 slices shown, 11 images]
[im 10/123  soft-tissue]
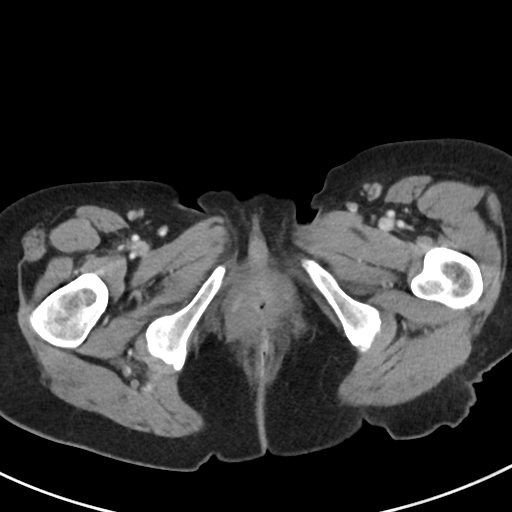
[im 10/123  bone]
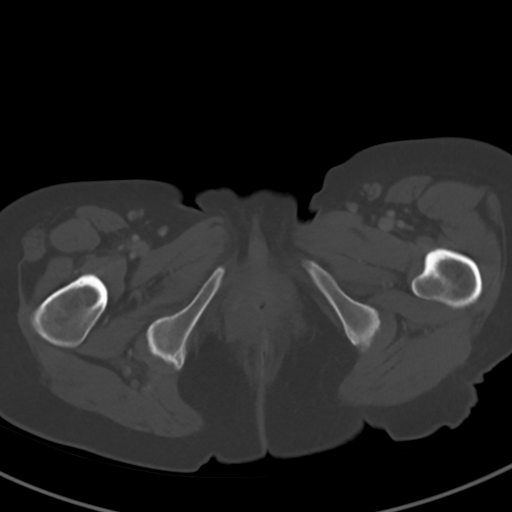
[im 29/123  soft-tissue]
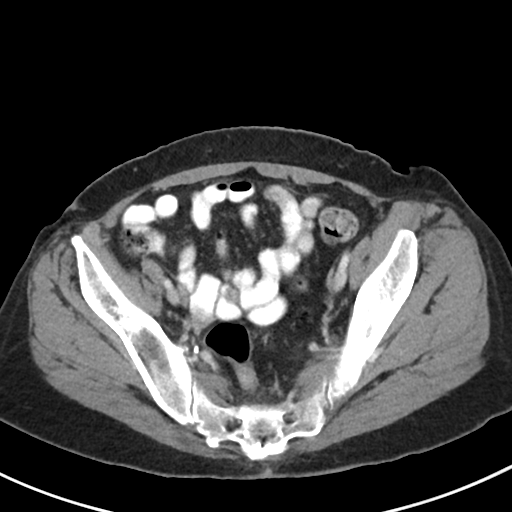
[im 38/123  soft-tissue]
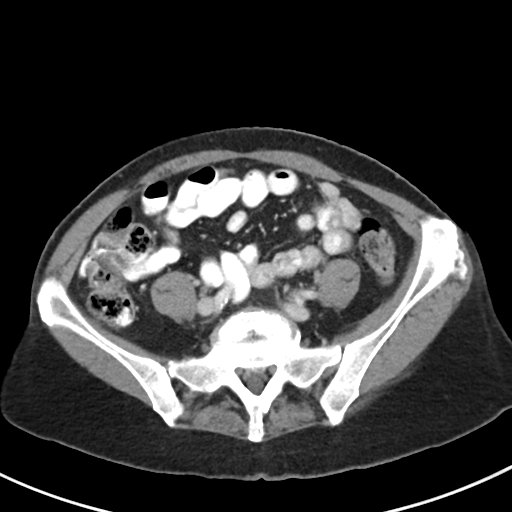
[im 47/123  soft-tissue]
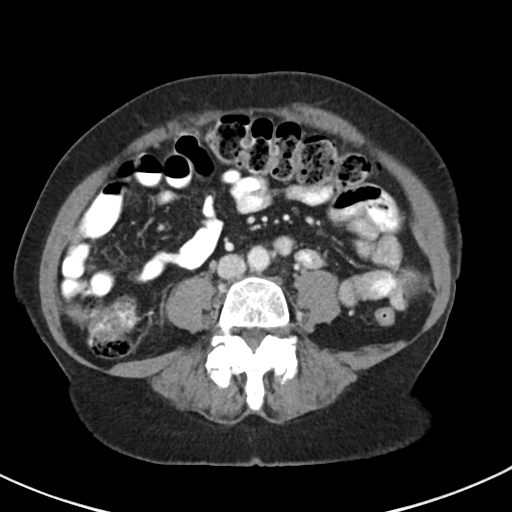
[im 66/123  soft-tissue]
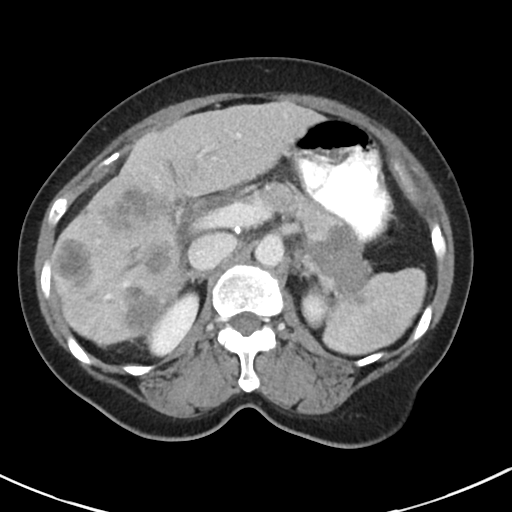
[im 76/123  soft-tissue]
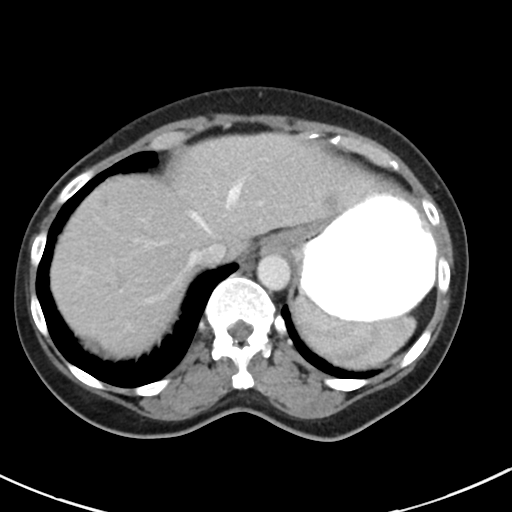
[im 85/123  soft-tissue]
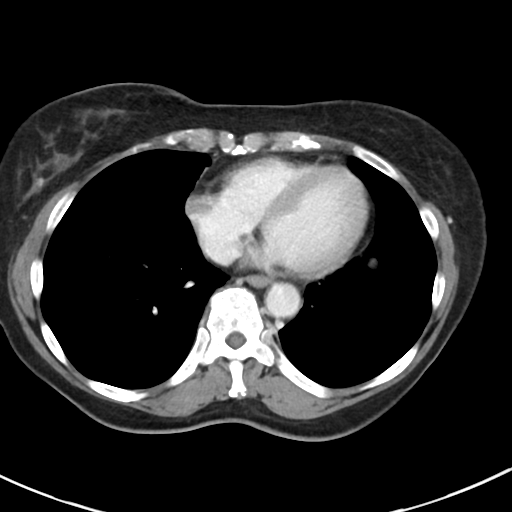
[im 104/123  soft-tissue]
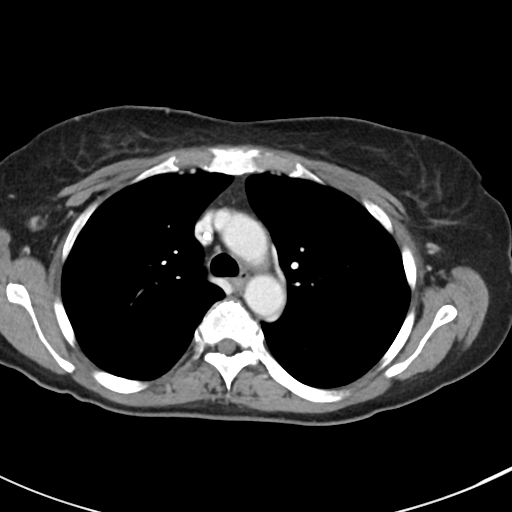
[im 113/123  soft-tissue]
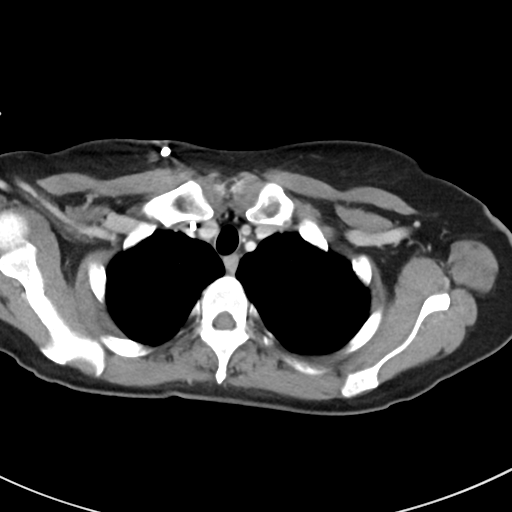
[im 113/123  bone]
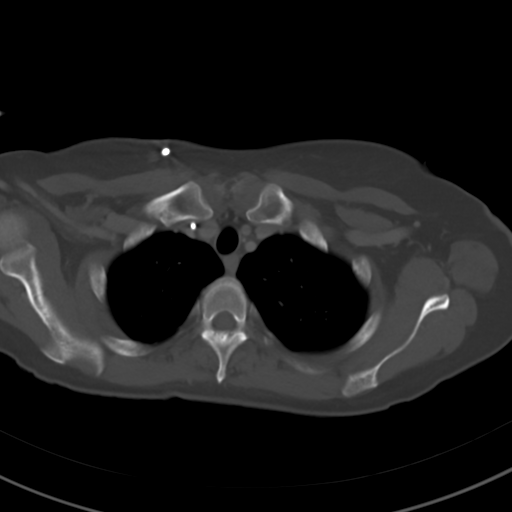

[Series 5: coronal · coronal · 0.58mm/px · 3 of 94 slices shown]
[im 32/94  soft-tissue]
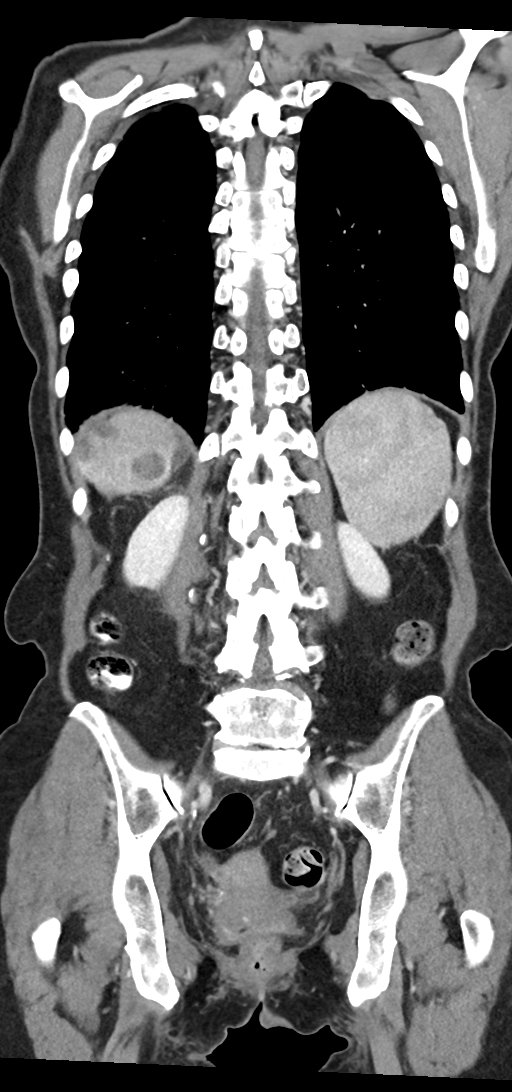
[im 42/94  soft-tissue]
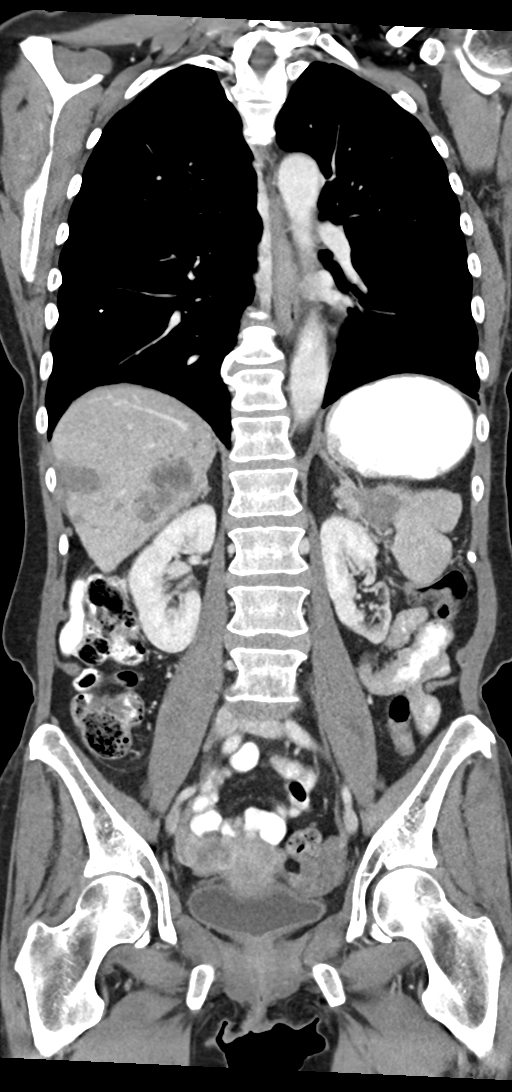
[im 52/94  soft-tissue]
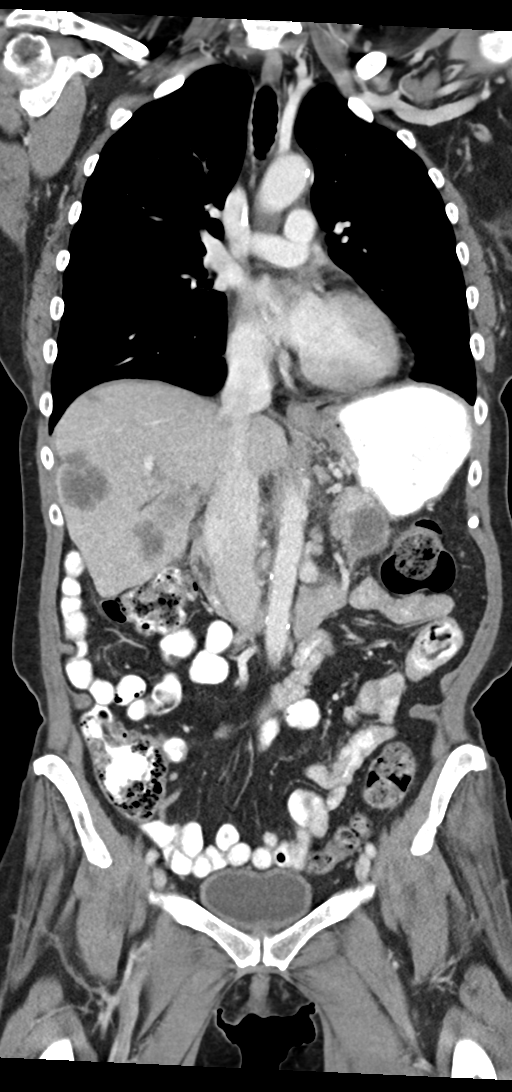

[12 of 46 positions shown; findings below may reference images not displayed]

RADIATION DOSE REDUCTION: This exam was performed according to the
departmental dose-optimization program which includes automated
exposure control, adjustment of the mA and/or kV according to
patient size and/or use of iterative reconstruction technique.

CONTRAST:  80mL OMNIPAQUE IOHEXOL 300 MG/ML  SOLN
FINDINGS: CT CHEST FINDINGS

Cardiovascular: Right IJ Port-A-Cath terminates in the SVC.
Atherosclerotic calcification of the aorta. Heart size normal. No
pericardial effusion.

Mediastinum/Nodes: No pathologically enlarged mediastinal, hilar or
axillary lymph nodes. Esophagus is grossly unremarkable.

Lungs/Pleura: Anterior left lower lobe nodule measures 1.5 x 1.8 cm
(4/93), stable. No new pulmonary nodules. No pleural fluid. Airway
is unremarkable.

Musculoskeletal: Degenerative changes in the spine. No worrisome
lytic or sclerotic lesions.

CT ABDOMEN PELVIS FINDINGS

Hepatobiliary: Heterogeneous nodules and masses in the right and
left hepatic lobes are stable to enlarged. Index lesion in the right
hepatic lobe measures 3.0 x 3.8 cm (2/56), previously 2.0 x 2.4 cm
on 04/24/2021. Largest mass is seen in the inferior right hepatic
lobe, measuring 5.1 x 5.5 cm (2/62), enlarged from 3.2 x 3.6 cm.
Difficult to exclude invasion of the adjacent gallbladder. No
biliary ductal dilatation.

Pancreas: Low-attenuation tail mass measures 3.5 x 4.4 cm, enlarged
from 3.1 x 4.1 cm on 04/24/2021. Otherwise unremarkable.

Spleen: Negative.

Adrenals/Urinary Tract: Adrenal glands are unremarkable. Scarring in
the kidneys bilaterally, left greater than right. Ureters are
decompressed. Bladder is grossly unremarkable.

Stomach/Bowel: Stomach, small bowel, appendix and colon are
unremarkable with exception of stool in the majority of the colon,
indicative of constipation.

Vascular/Lymphatic: Atherosclerotic calcification of the aorta. No
pathologically enlarged lymph nodes.

Reproductive: Uterus is visualized. Heterogeneous left ovarian
lesion measures 3.1 x 3.3 cm, as before. Slightly hypodense lesions
in the right ovary measure up to 2.1 cm, also similar.

Other: Trace pelvic free fluid. Left lower quadrant omental nodule
measures 6 mm (2/91), stable. Mesenteries and peritoneum are
otherwise unremarkable.

Musculoskeletal: Degenerative changes in the spine. No worrisome
lytic or sclerotic lesions. Minimal grade 1 anterolisthesis of L4 on
L5.
IMPRESSION: 1. Overall progression of disease, as evidenced by enlarging hepatic
metastases. Difficult to exclude associated invasion of the
gallbladder wall.
2. Pancreatic mass and left lower lobe pulmonary metastasis are
stable.
3. Stable left lower quadrant omental nodule.
4. Ovarian lesions bilaterally, left greater than right, similar to
04/24/2021. Lesion cannot be characterized confidently as simple
cysts. Consider baseline pelvic ultrasound in further initial
evaluation, as malignancy cannot be excluded.
5.  Aortic atherosclerosis ([id number]).
